# Patient Record
Sex: Male | Born: 1955 | Race: White | Hispanic: No | Marital: Single | Smoking: Never smoker
Health system: Southern US, Community
[De-identification: ages and names within clinical notes are randomized; demographics above are authoritative.]

## PROBLEM LIST (undated history)

## (undated) DIAGNOSIS — E669 Obesity, unspecified: Secondary | ICD-10-CM

## (undated) DIAGNOSIS — M1990 Unspecified osteoarthritis, unspecified site: Secondary | ICD-10-CM

## (undated) DIAGNOSIS — C61 Malignant neoplasm of prostate: Secondary | ICD-10-CM

## (undated) DIAGNOSIS — N529 Male erectile dysfunction, unspecified: Secondary | ICD-10-CM

## (undated) DIAGNOSIS — G4733 Obstructive sleep apnea (adult) (pediatric): Secondary | ICD-10-CM

## (undated) DIAGNOSIS — R1011 Right upper quadrant pain: Secondary | ICD-10-CM

## (undated) DIAGNOSIS — K219 Gastro-esophageal reflux disease without esophagitis: Secondary | ICD-10-CM

## (undated) DIAGNOSIS — I10 Essential (primary) hypertension: Secondary | ICD-10-CM

## (undated) DIAGNOSIS — K635 Polyp of colon: Secondary | ICD-10-CM

## (undated) DIAGNOSIS — F329 Major depressive disorder, single episode, unspecified: Secondary | ICD-10-CM

## (undated) DIAGNOSIS — E782 Mixed hyperlipidemia: Secondary | ICD-10-CM

## (undated) DIAGNOSIS — M109 Gout, unspecified: Secondary | ICD-10-CM

## (undated) DIAGNOSIS — T4145XA Adverse effect of unspecified anesthetic, initial encounter: Secondary | ICD-10-CM

## (undated) DIAGNOSIS — E7800 Pure hypercholesterolemia, unspecified: Secondary | ICD-10-CM

## (undated) DIAGNOSIS — E119 Type 2 diabetes mellitus without complications: Secondary | ICD-10-CM

## (undated) DIAGNOSIS — Z01818 Encounter for other preprocedural examination: Secondary | ICD-10-CM

## (undated) DIAGNOSIS — Z8739 Personal history of other diseases of the musculoskeletal system and connective tissue: Secondary | ICD-10-CM

## (undated) DIAGNOSIS — N200 Calculus of kidney: Secondary | ICD-10-CM

## (undated) DIAGNOSIS — F419 Anxiety disorder, unspecified: Secondary | ICD-10-CM

## (undated) DIAGNOSIS — R519 Headache, unspecified: Secondary | ICD-10-CM

## (undated) DIAGNOSIS — R350 Frequency of micturition: Secondary | ICD-10-CM

## (undated) DIAGNOSIS — J986 Disorders of diaphragm: Secondary | ICD-10-CM

## (undated) DIAGNOSIS — R0902 Hypoxemia: Secondary | ICD-10-CM

## (undated) DIAGNOSIS — G709 Myoneural disorder, unspecified: Secondary | ICD-10-CM

## (undated) DIAGNOSIS — R55 Syncope and collapse: Secondary | ICD-10-CM

## (undated) DIAGNOSIS — K429 Umbilical hernia without obstruction or gangrene: Secondary | ICD-10-CM

## (undated) DIAGNOSIS — Z87442 Personal history of urinary calculi: Secondary | ICD-10-CM

## (undated) DIAGNOSIS — IMO0001 Reserved for inherently not codable concepts without codable children: Secondary | ICD-10-CM

## (undated) DIAGNOSIS — R0602 Shortness of breath: Secondary | ICD-10-CM

## (undated) DIAGNOSIS — T8859XA Other complications of anesthesia, initial encounter: Secondary | ICD-10-CM

## (undated) DIAGNOSIS — R05 Cough: Secondary | ICD-10-CM

## (undated) HISTORY — PX: HERNIA REPAIR: SHX51

## (undated) HISTORY — DX: Encounter for other preprocedural examination: Z01.818

## (undated) HISTORY — PX: CATARACT EXTRACTION W/ INTRAOCULAR LENS  IMPLANT, BILATERAL: SHX1307

## (undated) HISTORY — DX: Major depressive disorder, single episode, unspecified: F32.9

## (undated) HISTORY — DX: Umbilical hernia without obstruction or gangrene: K42.9

## (undated) HISTORY — DX: Essential (primary) hypertension: I10

## (undated) HISTORY — DX: Obstructive sleep apnea (adult) (pediatric): G47.33

## (undated) HISTORY — PX: PROSTATE BIOPSY: SHX241

## (undated) HISTORY — DX: Hypoxemia: R09.02

## (undated) HISTORY — DX: Obesity, unspecified: E66.9

---
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
## 2004-06-02 ENCOUNTER — Ambulatory Visit: Payer: Self-pay | Admitting: Family Medicine

## 2004-06-02 ENCOUNTER — Ambulatory Visit: Payer: Self-pay | Admitting: Sports Medicine

## 2004-06-02 ENCOUNTER — Inpatient Hospital Stay (HOSPITAL_COMMUNITY): Admission: EM | Admit: 2004-06-02 | Discharge: 2004-06-04 | Payer: Self-pay | Admitting: Emergency Medicine

## 2004-06-02 ENCOUNTER — Encounter: Payer: Self-pay | Admitting: Cardiology

## 2004-06-02 ENCOUNTER — Ambulatory Visit: Payer: Self-pay | Admitting: Cardiology

## 2004-06-02 ENCOUNTER — Ambulatory Visit (HOSPITAL_COMMUNITY): Admission: RE | Admit: 2004-06-02 | Discharge: 2004-06-02 | Payer: Self-pay | Admitting: Family Medicine

## 2004-06-10 ENCOUNTER — Ambulatory Visit: Payer: Self-pay | Admitting: Family Medicine

## 2004-06-15 ENCOUNTER — Ambulatory Visit: Payer: Self-pay | Admitting: Family Medicine

## 2004-07-06 ENCOUNTER — Ambulatory Visit: Payer: Self-pay | Admitting: Family Medicine

## 2004-07-26 ENCOUNTER — Ambulatory Visit: Payer: Self-pay | Admitting: Sports Medicine

## 2004-08-24 ENCOUNTER — Ambulatory Visit: Payer: Self-pay | Admitting: Family Medicine

## 2004-09-21 ENCOUNTER — Ambulatory Visit (HOSPITAL_COMMUNITY): Admission: RE | Admit: 2004-09-21 | Discharge: 2004-09-21 | Payer: Self-pay | Admitting: Family Medicine

## 2004-09-21 ENCOUNTER — Ambulatory Visit: Payer: Self-pay | Admitting: Family Medicine

## 2004-11-23 ENCOUNTER — Ambulatory Visit: Payer: Self-pay | Admitting: Family Medicine

## 2005-03-15 ENCOUNTER — Ambulatory Visit: Payer: Self-pay | Admitting: Family Medicine

## 2005-04-21 ENCOUNTER — Ambulatory Visit: Payer: Self-pay | Admitting: Family Medicine

## 2005-06-23 ENCOUNTER — Ambulatory Visit: Payer: Self-pay | Admitting: Family Medicine

## 2005-07-06 ENCOUNTER — Ambulatory Visit: Payer: Self-pay | Admitting: Family Medicine

## 2006-01-03 ENCOUNTER — Ambulatory Visit: Payer: Self-pay | Admitting: Family Medicine

## 2006-01-16 ENCOUNTER — Ambulatory Visit: Payer: Self-pay | Admitting: Sports Medicine

## 2006-06-29 DIAGNOSIS — F339 Major depressive disorder, recurrent, unspecified: Secondary | ICD-10-CM

## 2006-06-29 DIAGNOSIS — I10 Essential (primary) hypertension: Secondary | ICD-10-CM | POA: Insufficient documentation

## 2006-06-29 DIAGNOSIS — M109 Gout, unspecified: Secondary | ICD-10-CM

## 2006-06-29 DIAGNOSIS — F411 Generalized anxiety disorder: Secondary | ICD-10-CM

## 2006-06-29 DIAGNOSIS — IMO0001 Reserved for inherently not codable concepts without codable children: Secondary | ICD-10-CM

## 2006-06-29 HISTORY — DX: Gout, unspecified: M10.9

## 2006-06-29 HISTORY — DX: Reserved for inherently not codable concepts without codable children: IMO0001

## 2006-11-15 ENCOUNTER — Ambulatory Visit: Payer: Self-pay | Admitting: Family Medicine

## 2006-11-15 ENCOUNTER — Encounter (INDEPENDENT_AMBULATORY_CARE_PROVIDER_SITE_OTHER): Payer: Self-pay | Admitting: *Deleted

## 2006-11-15 DIAGNOSIS — K429 Umbilical hernia without obstruction or gangrene: Secondary | ICD-10-CM | POA: Insufficient documentation

## 2006-11-16 LAB — CONVERTED CEMR LAB
CO2: 29 meq/L (ref 19–32)
Calcium: 9.1 mg/dL (ref 8.4–10.5)
Chloride: 99 meq/L (ref 96–112)
Creatinine, Ser: 1.02 mg/dL (ref 0.40–1.50)
Glucose, Bld: 158 mg/dL — ABNORMAL HIGH (ref 70–99)

## 2007-01-31 ENCOUNTER — Encounter (INDEPENDENT_AMBULATORY_CARE_PROVIDER_SITE_OTHER): Payer: Self-pay | Admitting: *Deleted

## 2007-01-31 ENCOUNTER — Ambulatory Visit: Payer: Self-pay | Admitting: Family Medicine

## 2007-02-01 LAB — CONVERTED CEMR LAB
BUN: 12 mg/dL (ref 6–23)
Calcium: 8.9 mg/dL (ref 8.4–10.5)
Cholesterol: 191 mg/dL (ref 0–200)
Glucose, Bld: 155 mg/dL — ABNORMAL HIGH (ref 70–99)
Hemoglobin: 14.8 g/dL (ref 13.0–17.0)
MCHC: 32 g/dL (ref 30.0–36.0)
MCV: 87 fL (ref 78.0–100.0)
Potassium: 4.6 meq/L (ref 3.5–5.3)
RBC: 5.31 M/uL (ref 4.22–5.81)
RDW: 14.4 % — ABNORMAL HIGH (ref 11.5–14.0)
Sodium: 139 meq/L (ref 135–145)
Triglycerides: 268 mg/dL — ABNORMAL HIGH (ref <150)
VLDL: 54 mg/dL — ABNORMAL HIGH (ref 0–40)

## 2007-02-07 ENCOUNTER — Ambulatory Visit (HOSPITAL_COMMUNITY): Admission: RE | Admit: 2007-02-07 | Discharge: 2007-02-07 | Payer: Self-pay | Admitting: *Deleted

## 2007-02-19 ENCOUNTER — Ambulatory Visit: Payer: Self-pay | Admitting: Sports Medicine

## 2007-02-19 DIAGNOSIS — E782 Mixed hyperlipidemia: Secondary | ICD-10-CM

## 2007-02-19 HISTORY — DX: Mixed hyperlipidemia: E78.2

## 2007-02-19 LAB — CONVERTED CEMR LAB
Blood Glucose, AC Bkfst: 169 mg/dL
Hgb A1c MFr Bld: 7.5 %

## 2007-03-05 ENCOUNTER — Encounter: Admission: RE | Admit: 2007-03-05 | Discharge: 2007-03-19 | Payer: Self-pay | Admitting: *Deleted

## 2007-03-07 ENCOUNTER — Encounter (INDEPENDENT_AMBULATORY_CARE_PROVIDER_SITE_OTHER): Payer: Self-pay | Admitting: *Deleted

## 2007-03-12 ENCOUNTER — Telehealth (INDEPENDENT_AMBULATORY_CARE_PROVIDER_SITE_OTHER): Payer: Self-pay | Admitting: Family Medicine

## 2007-04-03 ENCOUNTER — Ambulatory Visit: Payer: Self-pay | Admitting: Family Medicine

## 2007-04-04 ENCOUNTER — Telehealth (INDEPENDENT_AMBULATORY_CARE_PROVIDER_SITE_OTHER): Payer: Self-pay | Admitting: *Deleted

## 2007-04-16 ENCOUNTER — Encounter (INDEPENDENT_AMBULATORY_CARE_PROVIDER_SITE_OTHER): Payer: Self-pay | Admitting: *Deleted

## 2007-04-17 ENCOUNTER — Encounter (INDEPENDENT_AMBULATORY_CARE_PROVIDER_SITE_OTHER): Payer: Self-pay | Admitting: *Deleted

## 2007-05-04 ENCOUNTER — Encounter (INDEPENDENT_AMBULATORY_CARE_PROVIDER_SITE_OTHER): Payer: Self-pay | Admitting: *Deleted

## 2007-05-07 ENCOUNTER — Encounter (INDEPENDENT_AMBULATORY_CARE_PROVIDER_SITE_OTHER): Payer: Self-pay | Admitting: *Deleted

## 2007-07-20 ENCOUNTER — Ambulatory Visit: Payer: Self-pay | Admitting: Family Medicine

## 2007-07-20 ENCOUNTER — Encounter (INDEPENDENT_AMBULATORY_CARE_PROVIDER_SITE_OTHER): Payer: Self-pay | Admitting: *Deleted

## 2007-07-20 ENCOUNTER — Telehealth: Payer: Self-pay | Admitting: *Deleted

## 2007-07-20 LAB — CONVERTED CEMR LAB
ALT: 23 units/L (ref 0–53)
AST: 19 units/L (ref 0–37)
Albumin: 4.4 g/dL (ref 3.5–5.2)
Alkaline Phosphatase: 70 units/L (ref 39–117)
Calcium: 9 mg/dL (ref 8.4–10.5)
Chloride: 101 meq/L (ref 96–112)
Potassium: 4.2 meq/L (ref 3.5–5.3)
Sodium: 140 meq/L (ref 135–145)
Total Protein: 7.9 g/dL (ref 6.0–8.3)

## 2007-07-23 ENCOUNTER — Encounter (INDEPENDENT_AMBULATORY_CARE_PROVIDER_SITE_OTHER): Payer: Self-pay | Admitting: *Deleted

## 2007-08-21 ENCOUNTER — Ambulatory Visit: Payer: Self-pay | Admitting: Family Medicine

## 2007-08-27 ENCOUNTER — Encounter (INDEPENDENT_AMBULATORY_CARE_PROVIDER_SITE_OTHER): Payer: Self-pay | Admitting: *Deleted

## 2007-08-28 ENCOUNTER — Encounter (INDEPENDENT_AMBULATORY_CARE_PROVIDER_SITE_OTHER): Payer: Self-pay | Admitting: *Deleted

## 2007-10-03 ENCOUNTER — Encounter (INDEPENDENT_AMBULATORY_CARE_PROVIDER_SITE_OTHER): Payer: Self-pay | Admitting: *Deleted

## 2008-05-15 ENCOUNTER — Ambulatory Visit (HOSPITAL_COMMUNITY): Admission: RE | Admit: 2008-05-15 | Discharge: 2008-05-15 | Payer: Self-pay | Admitting: Family Medicine

## 2008-05-15 ENCOUNTER — Ambulatory Visit: Payer: Self-pay | Admitting: Family Medicine

## 2008-05-15 ENCOUNTER — Encounter: Payer: Self-pay | Admitting: Family Medicine

## 2008-05-16 ENCOUNTER — Telehealth (INDEPENDENT_AMBULATORY_CARE_PROVIDER_SITE_OTHER): Payer: Self-pay | Admitting: Family Medicine

## 2008-05-16 LAB — CONVERTED CEMR LAB
ALT: 18 units/L (ref 0–53)
AST: 21 units/L (ref 0–37)
Albumin: 4 g/dL (ref 3.5–5.2)
Alkaline Phosphatase: 89 units/L (ref 39–117)
BUN: 10 mg/dL (ref 6–23)
CO2: 27 meq/L (ref 19–32)
Calcium: 8.7 mg/dL (ref 8.4–10.5)
Chloride: 101 meq/L (ref 96–112)
Creatinine, Ser: 1.03 mg/dL (ref 0.40–1.50)
Direct LDL: 87 mg/dL
Glucose, Bld: 214 mg/dL — ABNORMAL HIGH (ref 70–99)
Potassium: 4.3 meq/L (ref 3.5–5.3)
Sodium: 142 meq/L (ref 135–145)
TSH: 3.7 microintl units/mL (ref 0.350–4.50)
Total Bilirubin: 0.5 mg/dL (ref 0.3–1.2)
Total Protein: 7.5 g/dL (ref 6.0–8.3)

## 2008-05-21 ENCOUNTER — Encounter: Payer: Self-pay | Admitting: Family Medicine

## 2008-06-02 ENCOUNTER — Ambulatory Visit: Payer: Self-pay | Admitting: Cardiovascular Disease

## 2008-10-03 ENCOUNTER — Encounter: Payer: Self-pay | Admitting: Family Medicine

## 2008-10-03 ENCOUNTER — Ambulatory Visit: Payer: Self-pay | Admitting: Family Medicine

## 2008-10-03 DIAGNOSIS — E1149 Type 2 diabetes mellitus with other diabetic neurological complication: Secondary | ICD-10-CM

## 2008-10-03 DIAGNOSIS — E1165 Type 2 diabetes mellitus with hyperglycemia: Secondary | ICD-10-CM

## 2008-10-03 LAB — CONVERTED CEMR LAB
ALT: 18 units/L (ref 0–53)
AST: 16 units/L (ref 0–37)
Albumin: 4.1 g/dL (ref 3.5–5.2)
Alkaline Phosphatase: 84 units/L (ref 39–117)
BUN: 11 mg/dL (ref 6–23)
Hgb A1c MFr Bld: 8.9 %
Potassium: 4.5 meq/L (ref 3.5–5.3)
Sodium: 142 meq/L (ref 135–145)

## 2008-10-07 ENCOUNTER — Encounter: Payer: Self-pay | Admitting: Family Medicine

## 2008-11-28 ENCOUNTER — Ambulatory Visit: Payer: Self-pay | Admitting: Family Medicine

## 2008-11-28 DIAGNOSIS — L909 Atrophic disorder of skin, unspecified: Secondary | ICD-10-CM | POA: Insufficient documentation

## 2008-11-28 DIAGNOSIS — L919 Hypertrophic disorder of the skin, unspecified: Secondary | ICD-10-CM

## 2009-06-09 ENCOUNTER — Ambulatory Visit: Payer: Self-pay | Admitting: Family Medicine

## 2009-10-12 ENCOUNTER — Encounter: Payer: Self-pay | Admitting: Family Medicine

## 2009-10-28 ENCOUNTER — Telehealth: Payer: Self-pay | Admitting: *Deleted

## 2009-12-04 ENCOUNTER — Ambulatory Visit: Payer: Self-pay | Admitting: Family Medicine

## 2009-12-04 LAB — CONVERTED CEMR LAB
Glucose, Urine, Semiquant: 500
Protein, U semiquant: 30

## 2009-12-14 ENCOUNTER — Telehealth: Payer: Self-pay | Admitting: Family Medicine

## 2009-12-21 ENCOUNTER — Emergency Department (HOSPITAL_COMMUNITY): Admission: EM | Admit: 2009-12-21 | Discharge: 2009-12-21 | Payer: Self-pay | Admitting: Emergency Medicine

## 2009-12-28 ENCOUNTER — Encounter: Payer: Self-pay | Admitting: Family Medicine

## 2010-02-10 ENCOUNTER — Ambulatory Visit: Payer: Self-pay | Admitting: Family Medicine

## 2010-02-10 ENCOUNTER — Encounter: Payer: Self-pay | Admitting: *Deleted

## 2010-02-11 ENCOUNTER — Encounter: Payer: Self-pay | Admitting: Family Medicine

## 2010-02-11 LAB — CONVERTED CEMR LAB
ALT: 23 units/L (ref 0–53)
AST: 27 units/L (ref 0–37)
Albumin: 3.8 g/dL (ref 3.5–5.2)
BUN: 9 mg/dL (ref 6–23)
CO2: 31 meq/L (ref 19–32)
Calcium: 8.7 mg/dL (ref 8.4–10.5)
Chloride: 97 meq/L (ref 96–112)
Potassium: 4.4 meq/L (ref 3.5–5.3)

## 2010-03-05 ENCOUNTER — Ambulatory Visit: Payer: Self-pay | Admitting: Family Medicine

## 2010-03-05 LAB — CONVERTED CEMR LAB: Hgb A1c MFr Bld: 10.7 %

## 2010-03-06 ENCOUNTER — Encounter: Payer: Self-pay | Admitting: Family Medicine

## 2010-05-13 ENCOUNTER — Ambulatory Visit: Admission: RE | Admit: 2010-05-13 | Discharge: 2010-05-13 | Payer: Self-pay | Source: Home / Self Care

## 2010-05-13 DIAGNOSIS — L03818 Cellulitis of other sites: Secondary | ICD-10-CM

## 2010-05-13 DIAGNOSIS — L02818 Cutaneous abscess of other sites: Secondary | ICD-10-CM | POA: Insufficient documentation

## 2010-06-01 NOTE — Assessment & Plan Note (Signed)
Summary: Skin tag removal, DM   Vital Signs:  Patient profile:   56 year old male Weight:      286 pounds Temp:     98.7 degrees F oral Pulse rate:   100 / minute Pulse rhythm:   regular BP sitting:   148 / 90  (left arm) Cuff size:   large  Vitals Entered By: Loralee Pacas CMA (March 05, 2010 1:39 PM) CC: ski tags Is Patient Diabetic? Yes Did you bring your meter with you today? No   Primary Provider:  Myrtie Soman  MD  CC:  ski tags.  History of Present Illness: 1. Skin tag Had skin tags removed about a year ago with cryotherapy. Now they have returned. Under right arm-pit. Irritating when putting on deodorant.   Habits & Providers  Alcohol-Tobacco-Diet     Tobacco Status: never  Exercise-Depression-Behavior     Have you felt down or hopeless? yes     Have you felt little pleasure in things? no     Depression Counseling: further diagnostic testing and/or other treatment is indicated     Seat Belt Use: always  Allergies: No Known Drug Allergies  Social History: Risk analyst Use:  always  Physical Exam  Skin:  20+ skin tags under right arm-pit. Bases range from 2-51mm.   Hair is shaved.  No erythema/swelling.    Impression & Recommendations:  Problem # 1:  SKIN TAG (ICD-701.9)  Removed 5 of the larger skin tags under right arm. Clamped with hemostat and then sniped. After removing 5, patient said he did not want any more removed at this time due to the pain. Will consider cryotherapy for future skin tags. This had worked in the past but today's technique was tried due to recurrence of skin tags. Told patient to keep underarm clean, apply antibiotic ointment, and keep area covered to prevent infection.   Orders: [facility]- Est Level  3 (99213) Skin Tags (up to 15) - [facility] (11200)  Problem # 2:  DIABETES MELLITUS, TYPE II, UNCONTROLLED (ICD-250.02) A1c is higher than previous. Today it is 10.7 (was 10.3 previously). Did not have time to discuss this issue in  depth. He probably needs insulin but think he will resist doing this. May consider increasing his glipizide to maximum if he tolerates. Will ask him to make appointment. But he says paying for gas has been difficult.  His updated medication list for this problem includes:    Aspirin Childrens 81 Mg Chew (Aspirin) .Marland Kitchen... Take 1 tablet by mouth once a day    Accupril 40 Mg Tabs (Quinapril hcl) .Marland Kitchen... 1 by mouth two times a day.    Metformin Hcl 1000 Mg Tabs (Metformin hcl) .Marland Kitchen... Take 1 tablet by mouth two times a day    Glipizide 5 Mg Tb24 (Glipizide) .Marland Kitchen... Take 1 tab by mouth daily    Glucotrol 10 Mg Tabs (Glipizide) .Marland Kitchen... Take one tablet twice a day.  Complete Medication List: 1)  Aspirin Childrens 81 Mg Chew (Aspirin) .... Take 1 tablet by mouth once a day 2)  Accupril 40 Mg Tabs (Quinapril hcl) .Marland Kitchen.. 1 by mouth two times a day. 3)  Toprol Xl 100 Mg Tb24 (Metoprolol succinate) .... 1.5 by mouth qday 4)  Norvasc 10 Mg Tabs (Amlodipine besylate) .Marland Kitchen.. 1 by mouth once daily 5)  Protonix 40 Mg Tbec (Pantoprazole sodium) .... Once daily 6)  Clarinex 5 Mg Tabs (Desloratadine) .Marland Kitchen.. 1 by mouth qday 7)  Albuterol 90 Mcg/act Aers (Albuterol) .Marland KitchenMarland KitchenMarland Kitchen  1-2 puffs inhaled q 4-6 hr prn 8)  Allopurinol 300 Mg Tabs (Allopurinol) .Marland Kitchen.. 1 by mouth qday 9)  Colchicine 0.6 Mg Tabs (Colchicine) .Marland Kitchen.. 1 by mouth every 1-2 hrs as needed gout 10)  Metformin Hcl 1000 Mg Tabs (Metformin hcl) .... Take 1 tablet by mouth two times a day 11)  Ibuprofen 800 Mg Tabs (Ibuprofen) .Marland Kitchen.. 1 by mouth 3x daily 12)  Flonase 50 Mcg/act Susp (Fluticasone propionate) .... 2 spr once daily  dispense 2 month supply 13)  Glipizide 5 Mg Tb24 (Glipizide) .... Take 1 tab by mouth daily 14)  Lipitor 20 Mg Tabs (Atorvastatin calcium) .... Take 1 tab by mouth qhs 15)  Accu-chek Aviva Strp (Glucose blood) .... For checking sugars two times a day; dispense 2 month's supply 16)  Ibuprofen 800 Mg Tabs (Ibuprofen) .... Take 1 tab by mouth every 6 hours as  needed for pain 17)  Glucotrol 10 Mg Tabs (Glipizide) .... Take one tablet twice a day.  Other Orders: A1C-[facility] (47829)   Orders Added: 1)  A1C-[facility] [83036] 2)  [facility]- Est Level  3 [56213] 3)  Skin Tags (up to 15) - [facility] [11200]    Laboratory Results   Blood Tests   Date/Time Received: March 05, 2010 1:49 PM  Date/Time Reported: March 05, 2010 2:09 PM   HGBA1C: 10.7%   (Normal Range: Non-Diabetic - 3-6%   Control Diabetic - 6-8%)  Comments: ...........test performed by...........Marland KitchenTerese Door, CMA

## 2010-06-01 NOTE — Miscellaneous (Signed)
Summary: chart update  Pt doesn't follow-up regularly. Has many medical problems but is not very motivated to address them adequately. Poor resources. 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   Clinical Lists Changes  Problems: Removed problem of SKIN TAG (ICD-701.9) Removed problem of NEVUS, BENIGN (ICD-216.9) Removed problem of SYMPTOM, COUGH (ICD-786.2) Observations: Added new observation of PRIMARY MD: Myrtie Soman  MD (10/12/2009 11:59)

## 2010-06-01 NOTE — Letter (Signed)
Summary: Generic Letter  [facility]  [address]   [place], [place] [address]   Phone: [phone]  Fax: [phone]    03/06/2010  ARTICE BERGERSON [address] [place], [place]  [address]  Dear Mr. HOVIS,  Your Hgb A1c (an average of your blood sugars over a 3 month period) was very elevated at 10.7. This means that your sugars values have consistently very high for the past few months. For patients with diabetes, the goal is to get this number below 7. Would you please make an appointment to come and see me as soon as you conveniently can so we could come up with a good plan for your diabetes? I am concerned because sugars this high may cause many health problems in the future, and I would like to prevent this as much as possible.     Sincerely,   Priscella Mann MD

## 2010-06-01 NOTE — Assessment & Plan Note (Signed)
Summary: pt has broke tooth and needs dental referral,tcb   Vital Signs:  Patient profile:   55 year old male Height:      67.5 inches Weight:      286.4 pounds BMI:     44.35 Temp:     98.3 degrees F oral Pulse rate:   98 / minute BP sitting:   157 / 99  (right arm) Cuff size:   large  Vitals Entered By: Garen Grams LPN (June 09, 2009 10:06 AM) CC: teeth problems Is Patient Diabetic? Yes Did you bring your meter with you today? No   Primary Care Provider:  Myrtie Soman  MD  CC:  teeth problems.  History of Present Illness: 1. Tooth problems:  Pt has been having some teeth problems for the past month.  He has a broken molar on the bottom, left; a hole in another molar on the right; multiple areas of teeth erosion; swelling gum and pain on the bottom right.  It has been getting worse.  The pain is now a 6/10.  He thinks that he might have had a fever but did not check his temp.  He does think that there is a foul smell occassionally.  Decreased  appetite.  Habits & Providers  Alcohol-Tobacco-Diet     Tobacco Status: never  Current Problems (verified): 1)  Abscess, Tooth  (ICD-522.5) 2)  Broken Tooth  (ICD-873.63) 3)  Skin Tag  (ICD-701.9) 4)  Wart, Viral  (ICD-078.10) 5)  Diabetes Mellitus, Type II, Uncontrolled  (ICD-250.02) 6)  Chest Discomfort  (ICD-786.59) 7)  Diabetes Mellitus  (ICD-250.0) 8)  Nevus, Benign  (ICD-216.9) 9)  Hyperlipidemia  (ICD-272.2) 10)  Diabetes-type 2  (ICD-250.00) 11)  Screening For Malignant Neoplasm, Prostate  (ICD-V76.44) 12)  Symptom, Cough  (ICD-786.2) 13)  Hemoccult Positive Stool  (ICD-578.1) 14)  Hernia, Umbilical  (ICD-553.1) 15)  Seborrheic Dermatitis, Nos  (ICD-690.10) 16)  Rhinitis, Allergic  (ICD-477.9) 17)  Obesity, Nos  (ICD-278.00) 18)  Hypertension, Benign Systemic  (ICD-401.1) 19)  Gout, Unspecified  (ICD-274.9) 20)  Depression, Major, Recurrent  (ICD-296.30) 21)  Anxiety  (ICD-300.00)  Current Medications  (verified): 1)  Aspirin Childrens 81 Mg Chew (Aspirin) .... Take 1 Tablet By Mouth Once A Day 2)  Accupril 40 Mg  Tabs (Quinapril Hcl) .Marland Kitchen.. 1 By Mouth Once Daily 3)  Toprol Xl 100 Mg  Tb24 (Metoprolol Succinate) .... 1.5 By Mouth Qday 4)  Norvasc 10 Mg Tabs (Amlodipine Besylate) .Marland Kitchen.. 1 By Mouth Once Daily 5)  Protonix 40 Mg  Tbec (Pantoprazole Sodium) .... Once Daily 6)  Clarinex 5 Mg  Tabs (Desloratadine) .Marland Kitchen.. 1 By Mouth Qday 7)  Albuterol 90 Mcg/act Aers (Albuterol) .Marland Kitchen.. 1-2 Puffs Inhaled Q 4-6 Hr Prn 8)  Allopurinol 300 Mg Tabs (Allopurinol) .Marland Kitchen.. 1 By Mouth Qday 9)  Colchicine 0.6 Mg Tabs (Colchicine) .Marland Kitchen.. 1 By Mouth Every 1-2 Hrs As Needed Gout 10)  Metformin Hcl 1000 Mg Tabs (Metformin Hcl) .... Take 1 Tablet By Mouth Two Times A Day 11)  Ibuprofen 800 Mg Tabs (Ibuprofen) .Marland Kitchen.. 1 By Mouth 3x Daily 12)  Flonase 50 Mcg/act Susp (Fluticasone Propionate) .... 2 Spr Once Daily  Dispense 2 Month Supply 13)  Glipizide 5 Mg  Tb24 (Glipizide) .... Take 1 Tab By Mouth Daily 14)  Lipitor 20 Mg Tabs (Atorvastatin Calcium) .... Take 1 Tab By Mouth Qhs 15)  Hydrochlorothiazide 12.5 Mg Tabs (Hydrochlorothiazide) .... One By Mouth Daily 16)  Accu-Chek Aviva  Strp (Glucose Blood) .Marland KitchenMarland KitchenMarland Kitchen  For Checking Sugars Two Times A Day; Dispense 2 Month's Supply 17)  Lotrimin Af 1 % Crea (Clotrimazole) .... Apply To Rash Two Times A Day X 14 Days; Disp 30 Gm Tube 18)  Keflex 500 Mg Caps (Cephalexin) .... Take 1 Tab By Mouth Twice A Day For 7 Days 19)  Ibuprofen 800 Mg Tabs (Ibuprofen) .... Take 1 Tab By Mouth Every 6 Hours As Needed For Pain  Allergies: No Known Drug Allergies  Past History:  Risk Factors: Smoking Status: never (06/09/2009)  Social History: Reviewed history from 11/28/2008 and no changes required. Lives with father and step-mother.  He does not work due to depression - out of work since 1992.  He does not drink or smoke.  Worked previously as a Production designer, theatre/television/film man at [employer] - no work in 14 years  secondary to mental health disability.  2 sisters and 1/2 brother here in [place]. Has MAP and Rudell Cobb to help with medical expenses. Transportation is difficult.  Has trouble making office visits. Not sure when he'll be able to come back to the office (due to father not giving him gas money)  Physical Exam  General:  Vital signs reviewed -- hypertensive, obese    Alert, appropriate; well-dressed and well-nourished  Mouth:  Fragment molar on the bottom left, hole at the base of another molar on the right.  Multiple areas of erosion.  Gums on the right bottom are swollen, red, and tender.   Impression & Recommendations:  Problem # 1:  ABSCESS, TOOTH (ICD-522.5) Treat with Keflex and Dentist Referral Orders: Dental Referral (Dentist) [facility]- Est Level  3 (16109)  Problem # 2:  BROKEN TOOTH (UEA-540.98) Treat with Ibuprofen and Dental Referral Orders: Dental Referral (Dentist) [facility]- Est Level  3 (11914)  Complete Medication List: 1)  Aspirin Childrens 81 Mg Chew (Aspirin) .... Take 1 tablet by mouth once a day 2)  Accupril 40 Mg Tabs (Quinapril hcl) .Marland Kitchen.. 1 by mouth once daily 3)  Toprol Xl 100 Mg Tb24 (Metoprolol succinate) .... 1.5 by mouth qday 4)  Norvasc 10 Mg Tabs (Amlodipine besylate) .Marland Kitchen.. 1 by mouth once daily 5)  Protonix 40 Mg Tbec (Pantoprazole sodium) .... Once daily 6)  Clarinex 5 Mg Tabs (Desloratadine) .Marland Kitchen.. 1 by mouth qday 7)  Albuterol 90 Mcg/act Aers (Albuterol) .Marland Kitchen.. 1-2 puffs inhaled q 4-6 hr prn 8)  Allopurinol 300 Mg Tabs (Allopurinol) .Marland Kitchen.. 1 by mouth qday 9)  Colchicine 0.6 Mg Tabs (Colchicine) .Marland Kitchen.. 1 by mouth every 1-2 hrs as needed gout 10)  Metformin Hcl 1000 Mg Tabs (Metformin hcl) .... Take 1 tablet by mouth two times a day 11)  Ibuprofen 800 Mg Tabs (Ibuprofen) .Marland Kitchen.. 1 by mouth 3x daily 12)  Flonase 50 Mcg/act Susp (Fluticasone propionate) .... 2 spr once daily  dispense 2 month supply 13)  Glipizide 5 Mg Tb24 (Glipizide) .... Take 1 tab by mouth daily 14)   Lipitor 20 Mg Tabs (Atorvastatin calcium) .... Take 1 tab by mouth qhs 15)  Hydrochlorothiazide 12.5 Mg Tabs (Hydrochlorothiazide) .... One by mouth daily 16)  Accu-chek Aviva Strp (Glucose blood) .... For checking sugars two times a day; dispense 2 month's supply 17)  Lotrimin Af 1 % Crea (Clotrimazole) .... Apply to rash two times a day x 14 days; disp 30 gm tube 18)  Keflex 500 Mg Caps (Cephalexin) .... Take 1 tab by mouth twice a day for 7 days 19)  Ibuprofen 800 Mg Tabs (Ibuprofen) .... Take 1 tab by mouth  every 6 hours as needed for pain  Patient Instructions: 1)  We will get you for an urgent referral to a dentist 2)  You have an infection of your gums, you will need to take Keflex 3)  I have also prescribed an anti-inflammatory medicine for your pain 4)  Please follow up with Dr. Rexene Alberts after you appt with the dentist Prescriptions: IBUPROFEN 800 MG TABS (IBUPROFEN) Take 1 tab by mouth every 6 hours as needed for pain  #30 x 0   Entered and Authorized by:   Angelena Sole MD   Signed by:   Angelena Sole MD on 06/09/2009   Method used:   Print then Give to Patient   RxID:   [id number] KEFLEX 500 MG CAPS (CEPHALEXIN) Take 1 tab by mouth twice a day for 7 days  #14 x 0   Entered and Authorized by:   Angelena Sole MD   Signed by:   Angelena Sole MD on 06/09/2009   Method used:   Print then Give to Patient   RxID:   [id number]

## 2010-06-01 NOTE — Progress Notes (Signed)
Summary: phnm sg   Phone Note Call from Patient Call back at [phone]   Caller: Patient Summary of Call: wants to know if he can go to someone different for his referral Initial call taken by: De Nurse,  December 14, 2009 4:36 PM  Follow-up for Phone Call        message left for patient to call back.  will explain when he calls that because he has the [employer] orange card this referral was made thru [facility] . they schedule appointments with doctors  that volunteer with this program. will not be able to schedule with another ophthalmologist  unless he wants to pay out of pocket. Follow-up by: Theresia Lo RN,  December 15, 2009 8:58 AM  Additional Follow-up for Phone Call Additional follow up Details #1::        pt  returned call. Additional Follow-up by: Clydell Hakim,  December 15, 2009 2:07 PM    Additional Follow-up for Phone Call Additional follow up Details #2::    spoke with patient and  he is referring to the surgical referral. states he saw Dr. Freida Busman 2 years ago and she told him he was not a surgical canditate. advised patient to keep appointment and if not satisfied with visit to call us back . Follow-up by: Theresia Lo RN,  December 15, 2009 3:42 PM

## 2010-06-01 NOTE — Progress Notes (Signed)
Summary: Rx Req   Phone Note Refill Request Call back at Home Phone [phone] Message from:  Patient  Refills Requested: Medication #1:  METFORMIN HCL 1000 MG TABS Take 1 tablet by mouth two times a day  Medication #2:  COLCHICINE 0.6 MG TABS 1 by mouth every 1-2 hrs as needed gout [facility].  Initial call taken by: Clydell Hakim,  October 28, 2009 4:29 PM  Follow-up for Phone Call       Follow-up by: Golden Circle RN,  October 28, 2009 4:38 PM    Prescriptions: METFORMIN HCL 1000 MG TABS (METFORMIN HCL) Take 1 tablet by mouth two times a day  #62 x 3   Entered by:   Golden Circle RN   Authorized by:   Priscella Mann MD   Signed by:   Golden Circle RN on 10/28/2009   Method used:   Printed then faxed to ...       [facility] (retail)       [address] [place], [place]  [address]       Ph: [phone]       Fax: [phone]   RxID:   [id number] COLCHICINE 0.6 MG TABS (COLCHICINE) 1 by mouth every 1-2 hrs as needed gout  #100 x 0   Entered by:   Golden Circle RN   Authorized by:   Priscella Mann MD   Signed by:   Golden Circle RN on 10/28/2009   Method used:   Printed then faxed to ...       [facility] Department (retail)       [address] [place], [place]  [address]       Ph: [phone]       Fax: [phone]   RxID:   [id number]  faxed with message that pt needs an appt before more refills.Golden Circle RN  October 28, 2009 4:40 PM

## 2010-06-01 NOTE — Consult Note (Signed)
Summary: [facility]   Imported By: Clydell Hakim 01/21/2010 14:58:50  _____________________________________________________________________  External Attachment:    Type:   Image     Comment:   External Document

## 2010-06-01 NOTE — Assessment & Plan Note (Signed)
Summary: f/u,df(resc'd from 8/2)[facility]     Vital Signs:  Patient profile:   55 year old male Height:      67.5 inches Weight:      282.6 pounds BMI:     43.77 Temp:     98.8 degrees F oral Pulse rate:   93 / minute BP sitting:   152 / 96  (left arm) Cuff size:   large  Vitals Entered By: Garen Grams LPN (December 04, 2009 3:32 PM)  Primary Provider:  Myrtie Soman  MD   History of Present Illness: 1. R eye blurriness Has been happening 2-3 x past 6 mo.  Symptoms resolve in a few days and have not been getting worse. Does not wear glasses.  Denies dizziness or headache.  2. Umbilical herniation Pt says getting bigger. Was 5 cm last year, now 6 cm.  Saw surgeon in past to have repaired, but surgeon said he wasn't good surgical candidate. Pt denies any abdominal pain or discomfort, blood in stool, nausea, dysphagia.  3. Diabetes A1c in clinic today is 10. Up from 7.8 last year. C/o urinary frequency (6-7 x a day), occasional tingling in toes (no numbness).  4. HTN Pt says he has been taking all medications except HCTZ, which he cannot afford. [name] on MAP program and does not work due to mental illness (traumatic childhood). He does not like/want to be around people.  Current Medications (verified): 1)  Aspirin Childrens 81 Mg Chew (Aspirin) .... Take 1 Tablet By Mouth Once A Day 2)  Accupril 40 Mg  Tabs (Quinapril Hcl) .Marland Kitchen.. 1 By Mouth Once Daily 3)  Toprol Xl 100 Mg  Tb24 (Metoprolol Succinate) .... 1.5 By Mouth Qday 4)  Norvasc 10 Mg Tabs (Amlodipine Besylate) .Marland Kitchen.. 1 By Mouth Once Daily 5)  Protonix 40 Mg  Tbec (Pantoprazole Sodium) .... Once Daily 6)  Clarinex 5 Mg  Tabs (Desloratadine) .Marland Kitchen.. 1 By Mouth Qday 7)  Albuterol 90 Mcg/act Aers (Albuterol) .Marland Kitchen.. 1-2 Puffs Inhaled Q 4-6 Hr Prn 8)  Allopurinol 300 Mg Tabs (Allopurinol) .Marland Kitchen.. 1 By Mouth Qday 9)  Colchicine 0.6 Mg Tabs (Colchicine) .Marland Kitchen.. 1 By Mouth Every 1-2 Hrs As Needed Gout 10)  Metformin Hcl 1000 Mg Tabs (Metformin  Hcl) .... Take 1 Tablet By Mouth Two Times A Day 11)  Ibuprofen 800 Mg Tabs (Ibuprofen) .Marland Kitchen.. 1 By Mouth 3x Daily 12)  Flonase 50 Mcg/act Susp (Fluticasone Propionate) .... 2 Spr Once Daily  Dispense 2 Month Supply 13)  Glipizide 5 Mg  Tb24 (Glipizide) .... Take 1 Tab By Mouth Daily 14)  Lipitor 20 Mg Tabs (Atorvastatin Calcium) .... Take 1 Tab By Mouth Qhs 15)  Hydrochlorothiazide 12.5 Mg Tabs (Hydrochlorothiazide) .... One By Mouth Daily 16)  Accu-Chek Aviva  Strp (Glucose Blood) .... For Checking Sugars Two Times A Day; Dispense 2 Month's Supply 17)  Lotrimin Af 1 % Crea (Clotrimazole) .... Apply To Rash Two Times A Day X 14 Days; Disp 30 Gm Tube 18)  Keflex 500 Mg Caps (Cephalexin) .... Take 1 Tab By Mouth Twice A Day For 7 Days 19)  Ibuprofen 800 Mg Tabs (Ibuprofen) .... 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   He does not work due to depression - out of work since 1992.   He does not drink or smoke.   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   2 sisters and 1/2 brother here in [place]. Has MAP and Rudell Cobb to help with medical expenses; on MAP program for medications.  Transportation is difficult.  Has trouble making office visits. Not sure when he'll be able to come back to the office (due to father not giving him gas money)  Physical Exam  General:  Well-groomed. Comfortable. Tearful when talking about past. Eyes:  Tried but unable to visualize retina.  Ears:  TM pearly gray BL Nose:  No rhinorrhea Mouth:  No erythema, sores Fair dentition with crowns.  Neck:  Supple, no thyromegaly Lungs:  CTAB Heart:  RRR,  no m/r/g Abdomen:  NABS, soft, non-tender throughout Distended with 6 cm hernia, non-reducible Extremities:  No pedal edema bilaterally Intact but mildly decrased pedal sensation  Psych:  Tearful Appropriate to questions   Impression & Recommendations:  Problem # 1:  DIABETES MELLITUS, TYPE II, UNCONTROLLED (ICD-250.02) Pt has not been following up as regularly as Dr. Rexene Alberts advised him to because he has difficulty getting to the clinic (having his father drive him and paying for gas). Diabetes poorly controlled. High A1c, elevated urine Glc & protein.  Pt not motivated/unwilling to make any lifestyle changes. Spoke with him extensively about making small changes, and he finally agreed to cut his carb intake by half and eat more beans. He will also reduce his 4 sodas a day to 2 and will drink more water or 0 calorie drinks. Increased his glipizide to 10 two times a day (from 5 once daily). Filled Rx for metformin. WIll consider starting insulin once he gets a glucometer and shows that he is able to regularly check his sugars. WIll refer him to ophthalmologist for retinal exam due to diabetes and blurry vision. But this may take time since he gets help through [facility] and must see specialists through [facility].  He will follow-up with me in about 2 wks. He will get labs first (FLP, CMET), and then make appt to see me a few days after. I want to evaluate his liver and especially his kidney function considering the protein in his urine.   His updated medication list for this problem includes:    Aspirin Childrens 81 Mg Chew (Aspirin) .Marland Kitchen... Take 1 tablet by mouth once a day    Accupril 40 Mg Tabs (Quinapril hcl) .Marland Kitchen... 1 by mouth two times a day.    Metformin Hcl 1000 Mg Tabs (Metformin hcl) .Marland Kitchen... Take 1 tablet by mouth two times a day    Glipizide 5 Mg Tb24 (Glipizide) .Marland Kitchen... Take 1 tab by mouth daily    Glucotrol 10 Mg Tabs (Glipizide) .Marland Kitchen... Take one tablet twice a  day.  Orders: A1C-[facility] (84696) UA Glucose/Protein-[facility] (81002) [facility]- Est Level  3 (29528) Ophthalmology Referral (Ophthalmology)Future Orders: Lipid-[facility] ([id number]) ... 12/31/2010 Comp Met-[facility] ([id number]) ... 12/31/2010  Problem # 2:  HERNIA, UMBILICAL (ICD-553.1) Not incarcerated but growing slowly in size and cosmetically distressing. Referred him to surgery but this may take time since he must get it through [facility].  Orders: [facility]- Est Level  3 (47425) Surgical Referral (Surgery)  Problem # 3:  ANXIETY (ICD-300.00) Pt has very low opinion of himself and abilities. Told him I thought  he was capable of more than he gave himself credit for. Told him to keep his mind and options open. He agreed and said he would try.  He has not been going to [facility] mental health clinic.   Complete Medication List: 1)  Aspirin Childrens 81 Mg Chew (Aspirin) .... Take 1 tablet by mouth once a day 2)  Accupril 40 Mg Tabs (Quinapril hcl) .Marland Kitchen.. 1 by mouth two times a day. 3)  Toprol Xl 100 Mg Tb24 (Metoprolol succinate) .... 1.5 by mouth qday 4)  Norvasc 10 Mg Tabs (Amlodipine besylate) .Marland Kitchen.. 1 by mouth once daily 5)  Protonix 40 Mg Tbec (Pantoprazole sodium) .... Once daily 6)  Clarinex 5 Mg Tabs (Desloratadine) .Marland Kitchen.. 1 by mouth qday 7)  Albuterol 90 Mcg/act Aers (Albuterol) .Marland Kitchen.. 1-2 puffs inhaled q 4-6 hr prn 8)  Allopurinol 300 Mg Tabs (Allopurinol) .Marland Kitchen.. 1 by mouth qday 9)  Colchicine 0.6 Mg Tabs (Colchicine) .Marland Kitchen.. 1 by mouth every 1-2 hrs as needed gout 10)  Metformin Hcl 1000 Mg Tabs (Metformin hcl) .... Take 1 tablet by mouth two times a day 11)  Ibuprofen 800 Mg Tabs (Ibuprofen) .Marland Kitchen.. 1 by mouth 3x daily 12)  Flonase 50 Mcg/act Susp (Fluticasone propionate) .... 2 spr once daily  dispense 2 month supply 13)  Glipizide 5 Mg Tb24 (Glipizide) .... Take 1 tab by mouth daily 14)  Lipitor 20 Mg Tabs (Atorvastatin calcium) .... Take 1 tab by mouth qhs 15)  Accu-chek Aviva Strp (Glucose  blood) .... For checking sugars two times a day; dispense 2 month's supply 16)  Ibuprofen 800 Mg Tabs (Ibuprofen) .... Take 1 tab by mouth every 6 hours as needed for pain 17)  Glucotrol 10 Mg Tabs (Glipizide) .... Take one tablet twice a day.     Patient Instructions: 1)  Please come see me in 2 wks. Please try to get labwork down before then. If you can't, then just come in to do labwork when you are able, and I will see you when those results come in.  2)  I increased your dose of Glipizide. Please take that increased dose now two times a day. I also am giving you a new prescription for metformin.  3)  Please also take one of your blood pressure medications Acupril now twice a day instead of once. 4)  Please try to make those healthy changes in your diet that we talked about. And try to exercise more, biking or swimming. 5)  And I feel like you are so much more capable than you give yourself credit for. Don't let your past hold you back more than it already has.  Prescriptions: METFORMIN HCL 1000 MG TABS (METFORMIN HCL) Take 1 tablet by mouth two times a day  #62 x 3   Entered and Authorized by:   Priscella Mann MD   Signed by:   [name] MD on 12/04/2009   Method used:   Print then Give to Patient   RxID:   [id number] GLUCOTROL 10 MG TABS (GLIPIZIDE) Take one tablet twice a day.  #60 x 3   Entered and Authorized by:   Priscella Mann MD   Signed by:   [name] MD on 12/04/2009   Method used:   Print then Give to Patient   RxID:   [id number]   Laboratory Results   Urine Tests  Date/Time Received: December 04, 2009 4:38 PM  Date/Time Reported: December 04, 2009 4:39 PM  Routine Urinalysis   Glucose: 500   (Normal Range: Negative) Protein: 30   (Normal Range: Negative)    Comments: ...........test performed by...........Marland KitchenTerese Door, CMA   Blood Tests   Date/Time Received: December 04, 2009 3:36 PM  Date/Time Reported: December 04, 2009 3:46  PM   HGBA1C: 10.3%   (Normal Range: Non-Diabetic - 3-6%   Control Diabetic - 6-8%)  Comments: .......test performed by........Marland Kitchen Garen Grams, LPN entered by Terese Door, CMA

## 2010-06-01 NOTE — Assessment & Plan Note (Signed)
Summary: Flu vaccine given   Nurse Visit   Vital Signs:  Patient profile:   55 year old male Temp:     98.3 degrees F  Vitals Entered By: Theresia Lo RN (February 10, 2010 2:16 PM)  Allergies: No Known Drug Allergies  Immunizations Administered:  Influenza Vaccine # 1:    Vaccine Type: Fluvax 3+    Site: right deltoid    Mfr: GlaxoSmithKline    Dose: 0.5 ml    Route: IM    Given by: Theresia Lo RN    Exp. Date: 10/27/2010    Lot #: [id number]    VIS given: 11/24/09 version given February 10, 2010.  Flu Vaccine Consent Questions:    Do you have a history of severe allergic reactions to this vaccine? no    Any prior history of allergic reactions to egg and/or gelatin? no    Do you have a sensitivity to the preservative Thimersol? no    Do you have a past history of Guillan-Barre Syndrome? no    Do you currently have an acute febrile illness? no    Have you ever had a severe reaction to latex? no    Vaccine information given and explained to patient? yes  Orders Added: 1)  Flu Vaccine 8yrs + [90658] 2)  Admin 1st Vaccine [90471]     Vital Signs:  Patient profile:   55 year old male Temp:     98.3 degrees F  Vitals Entered By: Theresia Lo RN (February 10, 2010 2:16 PM)

## 2010-06-03 NOTE — Assessment & Plan Note (Signed)
Summary: bumps all over body/oh park pt/eo   Vital Signs:  Patient profile:   55 year old male Height:      67.5 inches Weight:      281.6 pounds BMI:     43.61 Temp:     98.2 degrees F oral Pulse rate:   101 / minute BP sitting:   153 / 97  (left arm) Cuff size:   large  Vitals Entered By: Garen Grams LPN (May 13, 2010 2:21 PM) CC: bumps all over x months Is Patient Diabetic? No Pain Assessment Patient in pain? no        Primary Care Provider:  Myrtie Soman  MD  CC:  bumps all over x months.  History of Present Illness: 1. Bumps:  Pt has had bumps on different parts of his body for the past couple of months.  He decided to come in today because he is concerned that it could be a staph infection or cancer.  He had skin tags in his arm pits removed a couple of months ago and since then he has noticed these bumps.  Most of the bumps are near his right arm pit or his right flank.  There was a large one that his sister punctured with a needle and had a lot of purulent drainage.  PMHx: DMII - poorly controlled  ROS: denies fevers, chills, malaise, weight loss  Habits & Providers  Alcohol-Tobacco-Diet     Tobacco Status: never  Current Medications (verified): 1)  Aspirin Childrens 81 Mg Chew (Aspirin) .... Take 1 Tablet By Mouth Once A Day 2)  Accupril 40 Mg  Tabs (Quinapril Hcl) .Marland Kitchen.. 1 By Mouth Two Times A Day. 3)  Toprol Xl 100 Mg  Tb24 (Metoprolol Succinate) .... 1.5 By Mouth Qday 4)  Norvasc 10 Mg Tabs (Amlodipine Besylate) .Marland Kitchen.. 1 By Mouth Once Daily 5)  Protonix 40 Mg  Tbec (Pantoprazole Sodium) .... Once Daily 6)  Clarinex 5 Mg  Tabs (Desloratadine) .Marland Kitchen.. 1 By Mouth Qday 7)  Albuterol 90 Mcg/act Aers (Albuterol) .Marland Kitchen.. 1-2 Puffs Inhaled Q 4-6 Hr Prn 8)  Allopurinol 300 Mg Tabs (Allopurinol) .Marland Kitchen.. 1 By Mouth Qday 9)  Colchicine 0.6 Mg Tabs (Colchicine) .Marland Kitchen.. 1 By Mouth Every 1-2 Hrs As Needed Gout 10)  Metformin Hcl 1000 Mg Tabs (Metformin Hcl) .... Take 1 Tablet By  Mouth Two Times A Day 11)  Ibuprofen 800 Mg Tabs (Ibuprofen) .Marland Kitchen.. 1 By Mouth 3x Daily 12)  Flonase 50 Mcg/act Susp (Fluticasone Propionate) .... 2 Spr Once Daily  Dispense 2 Month Supply 13)  Glipizide 5 Mg  Tb24 (Glipizide) .... Take 1 Tab By Mouth Daily 14)  Lipitor 20 Mg Tabs (Atorvastatin Calcium) .... Take 1 Tab By Mouth Qhs 15)  Accu-Chek Aviva  Strp (Glucose Blood) .... For Checking Sugars Two Times A Day; Dispense 2 Month's Supply 16)  Ibuprofen 800 Mg Tabs (Ibuprofen) .... Take 1 Tab By Mouth Every 6 Hours As Needed For Pain 17)  Glucotrol 10 Mg Tabs (Glipizide) .... Take One Tablet Twice A Day. 18)  Smz-Tmp Ds 800-160 Mg Tabs (Sulfamethoxazole-Trimethoprim) .Marland Kitchen.. 1 Tab By Mouth Twice A Day For 10 Days 19)  Hibiclens 4 % Liqd (Chlorhexidine Gluconate) .... Use As Your Soap For The Next 7 Days  Allergies: No Known Drug Allergies  Past History:  Past Medical History: Reviewed history from 12/04/2009 and no changes required. 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 Lives with father and step-mother.   He does not work due to depression - out of work since 1992.   He does not drink or smoke.   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   2 sisters and 1/2 brother here in [place]. Has MAP and Rudell Cobb to help with medical expenses; on MAP program for medications.  Transportation is difficult.  Has trouble making office visits. Not sure when he'll be able to come back to the office (due to father not giving him gas money)  Physical Exam  General:  Vitals reviewed.  Afebrile.  no acute distress Lungs:  CTAB Heart:  RRR, no m/r/g Abdomen:  NABS, soft, non-tender throughout Distended with 6 cm hernia, non-reducible Msk:  no joint  tenderness and no joint swelling.   Skin:  Multiple areas of cellulitis +/- abscess under his right armpit and on his right flank and trunk Axillary Nodes:  + axillary LAD   Impression & Recommendations:  Problem # 1:  CELLULITIS AND ABSCESS OF OTHER SPECIFIED SITE [phone]) Assessment New  No areas that look like they could be drained.  Will put him on BactrimDS.  Advised to use warm compresses at least twice a day.  Also advised for him to use the Chlorhexidine wash for 1 week for decontamination.  Told him that if any of the spots get larger or more fluctuant to let us know because they might need to be drained.  Pt to follow up in 10 days to recheck. His updated medication list for this problem includes:    Smz-tmp Ds 800-160 Mg Tabs (Sulfamethoxazole-trimethoprim) .Marland Kitchen... 1 tab by mouth twice a day for 10 days  Orders: [facility]- Est  Level 4 (81191)  Complete Medication List: 1)  Aspirin Childrens 81 Mg Chew (Aspirin) .... Take 1 tablet by mouth once a day 2)  Accupril 40 Mg Tabs (Quinapril hcl) .Marland Kitchen.. 1 by mouth two times a day. 3)  Toprol Xl 100 Mg Tb24 (Metoprolol succinate) .... 1.5 by mouth qday 4)  Norvasc 10 Mg Tabs (Amlodipine besylate) .Marland Kitchen.. 1 by mouth once daily 5)  Protonix 40 Mg Tbec (Pantoprazole sodium) .... Once daily 6)  Clarinex 5 Mg Tabs (Desloratadine) .Marland Kitchen.. 1 by mouth qday 7)  Albuterol 90 Mcg/act Aers (Albuterol) .Marland Kitchen.. 1-2 puffs inhaled q 4-6 hr prn 8)  Allopurinol 300 Mg Tabs (Allopurinol) .Marland Kitchen.. 1 by mouth qday 9)  Colchicine 0.6 Mg Tabs (Colchicine) .Marland Kitchen.. 1 by mouth every 1-2 hrs as needed gout 10)  Metformin Hcl 1000 Mg Tabs (Metformin hcl) .... Take 1 tablet by mouth two times a day 11)  Ibuprofen 800 Mg Tabs (Ibuprofen) .Marland Kitchen.. 1 by mouth 3x daily 12)  Flonase 50 Mcg/act Susp (Fluticasone propionate) .... 2 spr once daily  dispense 2 month supply 13)  Glipizide 5 Mg Tb24 (Glipizide) .... Take 1 tab by mouth daily 14)  Lipitor 20 Mg Tabs (Atorvastatin calcium) .... Take  1 tab by mouth qhs 15)  Accu-chek Aviva Strp (Glucose blood) .... For checking sugars two times a day; dispense 2 month's supply 16)  Ibuprofen 800 Mg Tabs (Ibuprofen) .... Take 1 tab by mouth every 6 hours as needed for pain 17)  Glucotrol 10 Mg Tabs (Glipizide) .... Take one tablet twice a day. 18)  Smz-tmp Ds 800-160 Mg Tabs (  Sulfamethoxazole-trimethoprim) .Marland Kitchen.. 1 tab by mouth twice a day for 10 days 19)  Hibiclens 4 % Liqd (Chlorhexidine gluconate) .... Use as your soap for the next 7 days  Patient Instructions: 1)  This does look like a bacterial infection and it is possible that it could be staph 2)  there isn't anything that we can drain today but if any of the spots get large, swollen, and soft then come back in and we can cut it open 3)  I am going to treat you with an antibiotic.  I also want you to use this soap for the next week 4)  Please schedule a follow up appointment in 1 week Prescriptions: HIBICLENS 4 % LIQD (CHLORHEXIDINE GLUCONATE) Use as your soap for the next 7 days  #1 x 1   Entered and Authorized by:   Angelena Sole MD   Signed by:   Angelena Sole MD on 05/13/2010   Method used:   Print then Give to Patient   RxID:   [id number] SMZ-TMP DS 800-160 MG TABS (SULFAMETHOXAZOLE-TRIMETHOPRIM) 1 tab by mouth twice a day for 10 days  #20 x 0   Entered and Authorized by:   Angelena Sole MD   Signed by:   Angelena Sole MD on 05/13/2010   Method used:   Print then Give to Patient   RxID:   [id number]    Orders Added: 1)  [facility]- Est  Level 4 [63875]

## 2010-06-28 ENCOUNTER — Other Ambulatory Visit: Payer: Self-pay | Admitting: *Deleted

## 2010-06-28 DIAGNOSIS — I10 Essential (primary) hypertension: Secondary | ICD-10-CM

## 2010-06-28 MED ORDER — METOPROLOL SUCCINATE ER 100 MG PO TB24: ORAL_TABLET | ORAL | Status: DC

## 2010-06-28 NOTE — Telephone Encounter (Signed)
Received refill request for Toprol XL . Spoke with patient and advised  to call for appointment before further refill is needed. .  Called in enough for 2 months .

## 2010-07-01 ENCOUNTER — Encounter: Payer: Self-pay | Admitting: Family Medicine

## 2010-07-01 ENCOUNTER — Other Ambulatory Visit: Payer: Self-pay | Admitting: Family Medicine

## 2010-07-15 ENCOUNTER — Encounter: Payer: Self-pay | Admitting: Family Medicine

## 2010-07-15 ENCOUNTER — Ambulatory Visit (INDEPENDENT_AMBULATORY_CARE_PROVIDER_SITE_OTHER): Payer: Self-pay | Admitting: Family Medicine

## 2010-07-15 VITALS — BP 132/86 | HR 93 | Temp 98.3°F | Ht 67.5 in | Wt 280.0 lb

## 2010-07-15 DIAGNOSIS — L03316 Cellulitis of umbilicus: Secondary | ICD-10-CM | POA: Insufficient documentation

## 2010-07-15 DIAGNOSIS — I10 Essential (primary) hypertension: Secondary | ICD-10-CM

## 2010-07-15 DIAGNOSIS — E669 Obesity, unspecified: Secondary | ICD-10-CM

## 2010-07-15 DIAGNOSIS — E782 Mixed hyperlipidemia: Secondary | ICD-10-CM

## 2010-07-15 DIAGNOSIS — L03818 Cellulitis of other sites: Secondary | ICD-10-CM

## 2010-07-15 DIAGNOSIS — L02818 Cutaneous abscess of other sites: Secondary | ICD-10-CM

## 2010-07-15 DIAGNOSIS — M109 Gout, unspecified: Secondary | ICD-10-CM

## 2010-07-15 DIAGNOSIS — R079 Chest pain, unspecified: Secondary | ICD-10-CM | POA: Insufficient documentation

## 2010-07-15 DIAGNOSIS — L03319 Cellulitis of trunk, unspecified: Secondary | ICD-10-CM

## 2010-07-15 LAB — LDL CHOLESTEROL, DIRECT: Direct LDL: 57 mg/dL

## 2010-07-15 LAB — CONVERTED CEMR LAB: Direct LDL: 57 mg/dL

## 2010-07-15 MED ORDER — ALBUTEROL 90 MCG/ACT IN AERS: 1.0000 | INHALATION_SPRAY | RESPIRATORY_TRACT | Status: DC | PRN

## 2010-07-15 MED ORDER — FLUTICASONE PROPIONATE 50 MCG/ACT NA SUSP: 2.0000 | Freq: Every day | NASAL | Status: DC

## 2010-07-15 MED ORDER — PANTOPRAZOLE SODIUM 40 MG PO TBEC: 40.0000 mg | DELAYED_RELEASE_TABLET | Freq: Every day | ORAL | Status: DC

## 2010-07-15 MED ORDER — ATORVASTATIN CALCIUM 20 MG PO TABS: 20.0000 mg | ORAL_TABLET | Freq: Every day | ORAL | Status: DC

## 2010-07-15 MED ORDER — ALLOPURINOL 300 MG PO TABS: 300.0000 mg | ORAL_TABLET | Freq: Every day | ORAL | Status: DC

## 2010-07-15 MED ORDER — METOPROLOL SUCCINATE ER 100 MG PO TB24: ORAL_TABLET | ORAL | Status: DC

## 2010-07-15 MED ORDER — METFORMIN HCL 1000 MG PO TABS: 1000.0000 mg | ORAL_TABLET | Freq: Two times a day (BID) | ORAL | Status: DC

## 2010-07-15 MED ORDER — GLIPIZIDE 10 MG PO TABS: 10.0000 mg | ORAL_TABLET | Freq: Two times a day (BID) | ORAL | Status: DC

## 2010-07-15 MED ORDER — NITROGLYCERIN 0.4 MG SL SUBL: 0.4000 mg | SUBLINGUAL_TABLET | SUBLINGUAL | Status: DC | PRN

## 2010-07-15 MED ORDER — AMLODIPINE BESYLATE 10 MG PO TABS: 10.0000 mg | ORAL_TABLET | Freq: Every day | ORAL | Status: DC

## 2010-07-15 MED ORDER — QUINAPRIL HCL 40 MG PO TABS: 40.0000 mg | ORAL_TABLET | Freq: Two times a day (BID) | ORAL | Status: DC

## 2010-07-15 MED ORDER — ASPIRIN 81 MG PO CHEW: 81.0000 mg | CHEWABLE_TABLET | Freq: Every day | ORAL | Status: DC

## 2010-07-15 NOTE — Patient Instructions (Signed)
Try some OTC yeast cream for the skin around your hernia. Please send me a list of medications that is covered by MAT, and I will try to pick a different allergy medicine for you. For now, use the nasal spray daily (2 sprays each nostril). For your diabetes: Take the Glipizde 10mg , 2 tablets every day. Continue to take the metformin. We will check your A1c again today. Let's try to lose weight and make better diet decisions.  For your chest pain: I think you need a stress test at some point. I am concerned because you are at high risk to get a heart attack. So when you feel like you can get this done, please let me know, and I will refer you. In the meantime, use the nitroglycerin if you need it.  For your catarcts: call the doctor you saw before and ask to see them again.  If you want to get your other skin tags removed, please make an appointment just to get that done.   Please follow-up with me in 2-3 months.

## 2010-07-15 NOTE — Assessment & Plan Note (Signed)
Occasional, mild chest pain bilateral chest that lasts for a day. Broad differential but there is potential that it may be angina especially in light of multiple comorbidities. Would like patient to get stress test but patient defers at this time. Would consider "later". Will give Rx for NTG prn. Advised to use for chest pain and to go to the ED if CP improved with it.

## 2010-07-15 NOTE — Assessment & Plan Note (Signed)
Abscesses nonfluctuant, erythematous just around borders. Does not appear infected or drainable at this time. Afebrile today. Advised keep area clean and continue antibiotic ointment. Advised to call if fevers or worsening cellulitis.

## 2010-07-15 NOTE — Assessment & Plan Note (Signed)
Controlled. Has not had exacerbation/neede colchicine for at least a year. Continue allopurinol, colchicine prn.

## 2010-07-15 NOTE — Progress Notes (Signed)
  Subjective:    Patient ID: Jacob [name], male    DOB: 1956/02/18, 55 y.o.   MRN: [id number]  HPI Patient here for medication refills.   1. Diabetes Two different doses of glipizide were on his medication reconciliation sheet (from 05/2010 visit) that he says he is compliant with. So unsure which dose he is actually taking; he denies taking both doses.  ROS: worsening blurred vision (supposedly from cataracts); denies paresthesias.   2. Skin infections/abscesses He was seen in January for skin infection. He has gotten other small superficial skin abscess on his legs and torso since then. This has been occurring for past 4 months. He punctures and drains them with a needle and then applies antibiotic ointment. In January, he was given a 10-day course of antibiotics ROS: ?subjective fevers  3. Chest pain  Occurring on and off for past several days. Not associated with activity. Nothing makes it better. Pain is diffusely across chest.  Has been offered stress testing in past. He was not interested in it then. He did not think he could tolerate the exercise. He thinks he may be able to walk up 2 flights of steps, but he thinks it will be very strenuous.   Review of Systems     Objective:   Physical Exam  Constitutional:       Obese  Cardiovascular: Normal rate, regular rhythm, normal heart sounds and intact distal pulses.   No murmur heard. Pulmonary/Chest: Effort normal and breath sounds normal. No respiratory distress. He exhibits no tenderness.  Abdominal: Bowel sounds are normal.       Large umbilical hernia. Some of it is reducible. No pain. Erythematous on right-side of hernia between skin folds. No tenderness. No foul-smell.   Psychiatric:       Appears anxious.           Assessment & Plan:

## 2010-07-15 NOTE — Assessment & Plan Note (Signed)
In skin folds around large umbilical hernia. Recommended OTC yeast preparation.

## 2010-07-15 NOTE — Assessment & Plan Note (Signed)
Diabetes is still poorly controlled. Although there is discrepancy between which dose of glipizide he is taking (there were 2 on his med rec sheet from January 2012 visit and he wasn't sure which dose he was on), I think he is taking his medications. Patient needs insulin but is resistant to it at this time for insulin would be next step in his diabetes management. Patient is difficult to work with since he seems resigned to his condition, wants treatment/medications and things done for him, but does not take initiative for himself. He refuses to make dietary/exercise modifications but his worsening and persistent symptoms do cause him anxiety. His A1c is improved from previously but still >9.0. Will follow-up in 2-3 months. Asked patient to think about starting insulin since his uncontrolled diabetes puts him at risk for many complications, including kidney problems, stroke/heart attack, and makes him more susceptible to getting skin infection. He also should get stress test. Offered Cardiolite versus exercise but not interested at this time. Wants time to think over and to make his other appointments (e.g., dental) first.

## 2010-07-15 NOTE — Assessment & Plan Note (Signed)
Discussed need for weight reduction. [name] aware he needs to lose weight (told about 40 pounds at last visit) to be considered candidate for hernia surgery. But he will not make dietary/exercise changes.

## 2010-09-10 ENCOUNTER — Telehealth: Payer: Self-pay | Admitting: Family Medicine

## 2010-09-10 NOTE — Telephone Encounter (Signed)
Received rx for albuterol, has 2 sets of directions, needs clarification.

## 2010-09-10 NOTE — Telephone Encounter (Signed)
Clarified that it should read 1 to 2 puffs q 4 to 6 hours.
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
## 2010-09-17 NOTE — Discharge Summary (Signed)
NAMEANGELO, [name] NO.:  [id number]   MEDICAL RECORD NO.:  [id number]          PATIENT TYPE:  INP   LOCATION:  2027                         FACILITY:  [facility]   PHYSICIAN:  Melina Fiddler, MD DATE OF BIRTH:  February 25, 1956   DATE OF ADMISSION:  06/02/2004  DATE OF DISCHARGE:  06/04/2004                                 DISCHARGE SUMMARY   PRIMARY CARE PHYSICIAN:  Alvira Philips, M.D., [facility].   DISCHARGE DIAGNOSES:  1.  High blood pressure.  2.  Obesity.  3.  Gastroesophageal reflux disease.  4.  Depression.   DISCHARGE MEDICATIONS:  1.  Aspirin 325 mg one tablet daily.  2.  Metoprolol 50 mg p.o. daily.  3.  Enalapril 5 mg p.o. daily.   HOSPITAL COURSE:  Problem 1. Cardiovascular.  This is a 55 year old male  that was admitted with a history of intermittent chest pain and shortness of  breath.  He was seen at the family practice center on the date of admission  for a new patient appointment and was found to have elevated systolic  pressure in the 200s and was given a dose of Toprol.  The patient presented  with a sudden drop in his blood pressure and also presented with shortness  of breath and was also given 325 mg of aspirin and was sent to the emergency  department.  At the emergency department he had a chest x-ray done that  showed elevation of the left hemidiaphragm and atelectasis in the lower left  lobe.  There was questionable widening of the mediastinum and a chest CT was  ordered to rule out aortic dissection.  The chest/abdominal CT showed no  evidence for aortic dissection and was significant for a small periumbilical  hernia, fatty changes in the liver and cholelithiasis.   On admission, the patient had a systolic blood pressure of 170, diastolic  113, pulse of 88, respirations 21, temperature 98.3, and pulse oximetry was  99% on room air.  The patient also had two sets of cardiac markers that were  within normal limits and  were done to rule out a myocardial infarction.  He  also had a CBC, CMET, urinalysis, and a urine drug screen on admission that  were all within normal limits.  During the length of admission, the patient  was not complaining of chest pain or shortness of breath.  The blood  pressure was still elevated during the length of admission but was trending  down.  The patient was treated with aspirin 325 mg daily.  Since admission,  he was started on metoprolol 50 mg daily and enalapril 5 mg daily and his  blood pressure was trending down.  On discharge day his temperature was 99,  heart rate 88 to 97, respiratory rate 20, blood pressure was 154/103 and he  was saturating above 95% on room air.  The patient had a benign clinical  exam with a regular rate and rhythm, no murmurs, rubs or gallops, and strong  peripheral pulses, lungs were clear to auscultation bilaterally with no  increased work of  breathing, no crackles or wheezing.  Abdomen was obese but  no hepatosplenomegaly and no masses.  Extremities had no edema.   The patient was consulted with a cardiologist.  A 2-D echo was performed and  there was an equivocal wall motion abnormality that was not shown on  Cardiolite.  The final report for the Cardiolite test was no evidence of  pharmacological induced myocardial ischemia or myocardial scar, and the left  ventricular ejection fraction was 52%.   Problem 2. Respiratory.  The patient's symptoms of shortness of breath and  the finding of atelectasis in the left lower lobe likely were related to  poor respiratory effort.  The patient reports he is very sedentary and also  is obese.  The patient was provided an incentive spirometer and the CT scan  was normal.  He did not require any oxygen supplementation and was  saturating always above 95% on room air.   Problem 3. Endocrine, questionable metabolic syndrome in this patient.  The  patient had a hemoglobin A1c that was 6.8 and it was  recommended he follow a  diet low in saturated fat and continue the carbohydrate modified diet that  was started in the hospital and patient encouraged in weight reduction.  The  patient also had a TSH level that was within normal limits and had a lipid  profile that showed total cholesterol 184, triglycerides 292, HDL 33, and  LDL 93.   Problem 4. Depression.  The patient stated he had over a 12-year history of  depression.  [name] not currently on any medication and has not been seen by  a doctor for over 10 years.  He reported that he is at baseline at this time  and this is an issue to be followed up as an outpatient.   CONSULTATIONS:  Cardiology was consulted with Dr. Willa Rough who followed  up with the patient in the hospital.   STUDIES, PROCEDURES:  The patient had a chest x-ray AP and lateral and one  portable, had a CT of abdomen and chest, had a 2-D echocardiogram and had an  adenosine Cardiolite.   PENDING RESULTS TO BE FOLLOWED:  None.   SUGGESTED FOLLOW-UP ITEMS:  The patient is to be followed up at the [facility] on Thursday, June 10, 2004 at 2:30 p.m. by Dr. Virgia Land  who is her primary care doctor.  The patient will need a blood pressure  check and antihypertensive medications should be adjusted as needed.      AM/MEDQ  D:  06/06/2004  T:  06/07/2004  Job:  [id number]   cc:   Alvira Philips, M.D.  [facility].  Family Prac. Resident  [place]  [place] [address]  Fax: [phone]

## 2010-09-17 NOTE — Consult Note (Signed)
NAMECRAIGORY, TOSTE NO.:  [id number]   MEDICAL RECORD NO.:  [id number]          PATIENT TYPE:  INP   LOCATION:  2027                         FACILITY:  [facility]   PHYSICIAN:  Willa Rough, M.D.     DATE OF BIRTH:  1956-04-02   DATE OF CONSULTATION:  06/02/2004  DATE OF DISCHARGE:                                   CONSULTATION   HISTORY OF PRESENT ILLNESS:  Mr. Busic is admitted by the teaching service  today.  He has had some problems with chest pain and some shortness of  breath.  He came to the clinic today and was significantly hypertensive.  At  some point, he received some medicines, and at another point, he had  decreased blood pressure.  [name] now quite stable.  The patient gives a  history of a multitude of various complaints.  [name] admitted for further  evaluation.  We are asked to see him to help evaluate his cardiac status.   PAST MEDICAL HISTORY:   ALLERGIES:  No known allergies.   MEDICATIONS:  No known medications.   SOCIAL HISTORY:  The patient lives with father and step-mother.  He does not  work due to depression.  He does not drink or smoke.   FAMILY HISTORY:  No strong family history of coronary disease.   REVIEW OF SYSTEMS:  At this time, the patient has several complaints.  He  has had a mild headache.  He mentions some hematochezia in the last several  months.  He says he has some chest pain and shortness of breath.  There is  also weight gain and polydipsia.  The patient also says he has a significant  cough at night time, and he has symptoms of gout from time to time.   PHYSICAL EXAMINATION:  VITAL SIGNS:  Blood pressure at this time is 145/90.  LUNGS:  Reveal question of a few crackles.  NECK:  Normal.  HEENT:  No marked abnormalities.  CARDIAC:  An S1 with an S2.  There are no clicks or significant murmurs.  ABDOMEN:  Benign.  EXTREMITIES:  He has no significant peripheral edema.  MUSCULOSKELETAL:  There are no major  musculoskeletal deformities.   EKG:  Reveals mild increased voltage with some nonspecific ST-T wave  changes.   CHEST X-RAY:  Reveals elevation of the left hemidiaphragm.  There is the  suggestion of some cardiomegaly.  There is some right peritracheal soft  tissue thickening.   LABORATORY DATA:  Cardiac enzymes are negative to this point.   PROBLEMS:  1.  Some chest pain and shortness of breath.  At this time, there is no      proof of coronary disease.  A 2-dimensional echo was ordered and done      this afternoon, but not yet reviewed.  I have ordered an adenosine      Cardiolite scan for tomorrow.  2.  Hypertension with a hypotensive response at some point.  The exact      etiology is not clear.  3.  Question of diabetes.  4.  Question of hematochezia.  5.  Question of gout.  6.  Depression.  7.  Peritracheal nodes on the chest x-ray.   From the cardiac viewpoint, I would suggest following up on the 2-D echo and  checking further enzymes.  Adenosine Cardiolite is to be done, and it is  ordered.  The primary care team has ordered a chest CT.  Further  recommendations will be made when these results are available.      JK/MEDQ  D:  06/02/2004  T:  06/02/2004  Job:  [id number]

## 2010-11-30 ENCOUNTER — Telehealth: Payer: Self-pay | Admitting: Family Medicine

## 2010-11-30 NOTE — Telephone Encounter (Signed)
[facility]. Told him that he would need referral to go back to them for cataracts.  He has the orange card and would like for [place] to approve for him to go.  He already has an appt on 8/20.

## 2010-12-01 DIAGNOSIS — H269 Unspecified cataract: Secondary | ICD-10-CM | POA: Insufficient documentation

## 2010-12-01 NOTE — Telephone Encounter (Signed)
Jacob [name] could you put in ophtho referral and i will fax it to [facility], thanks.

## 2010-12-01 NOTE — Telephone Encounter (Signed)
Ordered! Thanks Asha.

## 2010-12-01 NOTE — Telephone Encounter (Signed)
Referral faxed to [facility], left message on vm informing patient.

## 2010-12-27 ENCOUNTER — Telehealth: Payer: Self-pay | Admitting: Family Medicine

## 2010-12-27 NOTE — Telephone Encounter (Signed)
Left message on voicemail informing patient that referral was sent through [facility] and that if they didn't have his specialist available at the time the wait could be a few months. Told him if problem with eye was getting worse to come back in to see MD.

## 2010-12-27 NOTE — Telephone Encounter (Signed)
[name] is calling regarding an ophthamology appt he's been waiting for about a month. He had seen [facility] a year ago and was told that he need to be recertified for a new appt.  Explain to him that [facility] needed to be involved for the referral.  Please contact patient about this.

## 2011-01-06 ENCOUNTER — Encounter: Payer: Self-pay | Admitting: Family Medicine

## 2011-01-06 NOTE — Assessment & Plan Note (Signed)
Documentation only. No diabetic retinopathy on opthalmology fundus examination.

## 2011-01-06 NOTE — Progress Notes (Signed)
  Subjective:    Patient ID: Jacob [name], male    DOB: 06-25-1955, 55 y.o.   MRN: [id number]  HPI    Review of Systems     Objective:   Physical Exam        Assessment & Plan:

## 2011-02-01 ENCOUNTER — Other Ambulatory Visit: Payer: Self-pay | Admitting: Family Medicine

## 2011-02-01 MED ORDER — ALBUTEROL 90 MCG/ACT IN AERS: 1.0000 | INHALATION_SPRAY | RESPIRATORY_TRACT | Status: DC | PRN

## 2011-02-22 ENCOUNTER — Other Ambulatory Visit: Payer: Self-pay | Admitting: Family Medicine

## 2011-02-22 DIAGNOSIS — I10 Essential (primary) hypertension: Secondary | ICD-10-CM

## 2011-02-22 MED ORDER — METOPROLOL SUCCINATE ER 100 MG PO TB24: ORAL_TABLET | ORAL | Status: DC

## 2011-05-03 HISTORY — PX: COLONOSCOPY W/ BIOPSIES AND POLYPECTOMY: SHX1376

## 2011-06-01 ENCOUNTER — Encounter: Payer: Self-pay | Admitting: Family Medicine

## 2011-06-01 ENCOUNTER — Ambulatory Visit (INDEPENDENT_AMBULATORY_CARE_PROVIDER_SITE_OTHER): Payer: Self-pay | Admitting: Family Medicine

## 2011-06-01 VITALS — BP 128/76 | HR 84 | Temp 98.6°F | Ht 67.6 in | Wt 280.0 lb

## 2011-06-01 DIAGNOSIS — K921 Melena: Secondary | ICD-10-CM | POA: Insufficient documentation

## 2011-06-01 DIAGNOSIS — E782 Mixed hyperlipidemia: Secondary | ICD-10-CM

## 2011-06-01 DIAGNOSIS — I10 Essential (primary) hypertension: Secondary | ICD-10-CM

## 2011-06-01 LAB — CBC
Hemoglobin: 14.6 g/dL (ref 13.0–17.0)
MCHC: 32.1 g/dL (ref 30.0–36.0)
WBC: 10 10*3/uL (ref 4.0–10.5)

## 2011-06-01 LAB — BASIC METABOLIC PANEL
CO2: 26 mEq/L (ref 19–32)
Calcium: 9 mg/dL (ref 8.4–10.5)
Creat: 1.01 mg/dL (ref 0.50–1.35)

## 2011-06-01 LAB — POCT GLYCOSYLATED HEMOGLOBIN (HGB A1C): Hemoglobin A1C: 8.9

## 2011-06-01 NOTE — Assessment & Plan Note (Signed)
Controlled on Norvasc, metoprolol, and quinapril. Continue. Follow-up in 6 months.

## 2011-06-01 NOTE — Assessment & Plan Note (Addendum)
Past 6 months. Will check CBC today. Patient given hemoccult cards. Will follow-up in 1 week. Will perform rectal exam at that time. Patient also asked to hold aspirin. Patient due for colonoscopy; he has no insurance but does have orange card. Will schedule him for colonoscopy.   UPDATE: Hemeoccult positive 3/3. Hgb 14.6, not tachycardic. Called patient and left message advising him to continue to hold aspirin. Follow-up in 1 month or sooner if significant increase blood in stool, dizzy, or other worrisome symptoms. Will increase priority of GI consult from routine (for colonoscopy) to urgent.

## 2011-06-01 NOTE — Assessment & Plan Note (Addendum)
HgbA1c improved from 9.5 to 8.9, however, still not controlled. Patient sometimes has difficulty paying for metformin. He has medications now. Continue glipizide. He does not want to start insulin. Encouraged weight loss. He declined nutrition or Health and Wellness consultation. Follow-up in 3 months.

## 2011-06-01 NOTE — Patient Instructions (Addendum)
Try to take the metformin.  Weight loss will help your diabetes. Let me know if you would like to talk to someone regarding this.  Stop the Protonix or take as needed for your acid reflux symptoms.   Please make an appointment to come in next week. Please bring your stool cards at that time.  Stop your aspirin for the next week.  We will discuss your lab results at your next visit.

## 2011-06-01 NOTE — Progress Notes (Signed)
  Subjective:    Patient ID: Jacob [name], male    DOB: 1956-01-12, 56 y.o.   MRN: [id number]  HPI Follow-up of:  1. HTN Does not measure blood pressures at home Compliant with medications 6/7 days of week ROS: occasional substernal chest pain with rest, lasts a few minutes. No chest pain today. Denies difficult breathing or nausea during these episodes. Denies dyspnea.  2. Diabetes Does not measure sugars at home Complaint with glipizide 6/7 days of week. [name] inconsistent on taking metformin since this is one medication he needs to pay for ($5). He just re-started it. ROS: complaining of tingling in his feet occasionally; denies vision changes   3. Blood in stool for the past 6 months He notices it when he wipes. Denies lightheadedness/falls. [name] taking aspirin.  No family history of colon cancer.  ROS: denies abdominal pain.   Review of Systems Per HPI.    Objective:   Physical Exam Gen: NAD Psych: appropriate, appears anxious CV: RRR, no m/r/g Pulm: CTAB without w/r/r Abd: distended, obese, NABS, soft, non-tender Ext: no edema Feet: no lesions, sensation intact, 2+ pedal pulses bilaterally    Assessment & Plan:

## 2011-06-02 ENCOUNTER — Encounter: Payer: Self-pay | Admitting: Family Medicine

## 2011-06-29 LAB — HEMOCCULT GUIAC POC 1CARD (OFFICE)
Card #2 Fecal Occult Blod, POC: POSITIVE
Card #3 Fecal Occult Blood, POC: POSITIVE
Fecal Occult Blood, POC: POSITIVE

## 2011-06-29 NOTE — Progress Notes (Signed)
Addended by: [place], Allyiah Gartner on: 06/29/2011 04:57 PM   Modules accepted: Orders

## 2011-07-01 ENCOUNTER — Encounter: Payer: Self-pay | Admitting: Family Medicine

## 2011-07-05 ENCOUNTER — Telehealth: Payer: Self-pay | Admitting: Family Medicine

## 2011-07-05 ENCOUNTER — Encounter: Payer: Self-pay | Admitting: Gastroenterology

## 2011-07-05 NOTE — Telephone Encounter (Signed)
Pt is returning call about referral °

## 2011-07-05 NOTE — Progress Notes (Signed)
Addended by: Madolyn Frieze, Marylene Land J on: 07/05/2011 01:19 PM   Modules accepted: Orders

## 2011-07-05 NOTE — Telephone Encounter (Signed)
Sorry I meant to start a phone note :-(   LMOVM again asking pt to return call.  Please inform pt when he calls:  Dr. [name] ([facility]) [address], 3rd floor March 25th, 2013 @ 10am. Be sure to take Jaynee Eagles Card  Also mailed reminder. Isaid Salvia, Maryjo Rochester

## 2011-07-25 ENCOUNTER — Ambulatory Visit (INDEPENDENT_AMBULATORY_CARE_PROVIDER_SITE_OTHER): Payer: Self-pay | Admitting: Gastroenterology

## 2011-07-25 ENCOUNTER — Encounter: Payer: Self-pay | Admitting: Gastroenterology

## 2011-07-25 VITALS — BP 136/72 | HR 80 | Ht 69.0 in | Wt 278.0 lb

## 2011-07-25 DIAGNOSIS — K429 Umbilical hernia without obstruction or gangrene: Secondary | ICD-10-CM

## 2011-07-25 DIAGNOSIS — K921 Melena: Secondary | ICD-10-CM

## 2011-07-25 NOTE — Patient Instructions (Signed)
Your Colonoscopy is scheduled on 07/28/2011 at 2pm Your Appointment at [facility] is scheduled on 08/04/2011 to arrive at 9:30am

## 2011-07-25 NOTE — Progress Notes (Signed)
History of Present Illness:  Jacob [name] is a 56 year old white male referred at the request of Dr. Madolyn Frieze for evaluation of rectal bleeding. For the past 8 months he noted bright red blood mixed with his stools and in the toilet water. He's without change in bowel habits, abdominal or rectal pain. Hemoccults have tested positive. Last hematocrit was 45.  The patient has a history of an umbilical hernia. He's been evaluated by surgery in the past and was told that he is not a great surgical candidate. He has discomfort related to the hernia.    Past Medical History  Diagnosis Date  . Umbilical hernia   . Depression   . Diabetes mellitus   . Hypertension   . Obesity    No past surgical history on file. family history is negative for Colon cancer. Current Outpatient Prescriptions  Medication Sig Dispense Refill  . albuterol (PROVENTIL,VENTOLIN) 90 MCG/ACT inhaler Inhale 1-2 puffs into the lungs every 4 (four) hours as needed for wheezing. 1-2 puffs inhaled every 4-6 hours as needed  17 g  3  . allopurinol (ZYLOPRIM) 300 MG tablet Take 300 mg by mouth as needed.      Marland Kitchen amLODipine (NORVASC) 10 MG tablet Take 1 tablet (10 mg total) by mouth daily.  90 tablet  3  . atorvastatin (LIPITOR) 20 MG tablet Take 1 tablet (20 mg total) by mouth at bedtime.  90 tablet  3  . desloratadine (CLARINEX) 5 MG tablet Take 5 mg by mouth daily.        . fluticasone (FLONASE) 50 MCG/ACT nasal spray 2 sprays by Nasal route daily. Dispense 2 month supply  16 g  3  . glipiZIDE (GLUCOTROL) 10 MG tablet Take 1 tablet (10 mg total) by mouth 2 (two) times daily.  180 tablet  3  . glucose blood (ACCU-CHEK AVIVA) test strip for checking sugars two times a day; dispense 2 month's supply       . ibuprofen (ADVIL,MOTRIN) 800 MG tablet Take 800 mg by mouth every 6 (six) hours as needed. For pain       . metoprolol succinate (TOPROL-XL) 100 MG 24 hr tablet Take 100 mg by mouth daily.      . quinapril (ACCUPRIL) 40 MG tablet  Take 40 mg by mouth daily.      Marland Kitchen DISCONTD: allopurinol (ZYLOPRIM) 300 MG tablet Take 1 tablet (300 mg total) by mouth daily.  90 tablet  3  . DISCONTD: metoprolol (TOPROL-XL) 100 MG 24 hr tablet 1.5 by mouth qday  90 tablet  0  . DISCONTD: quinapril (ACCUPRIL) 40 MG tablet Take 1 tablet (40 mg total) by mouth 2 (two) times daily.  180 tablet  3  . aspirin 81 MG chewable tablet Chew 81 mg by mouth daily. ON HOLD      . metFORMIN (GLUCOPHAGE) 1000 MG tablet Take 1 tablet (1,000 mg total) by mouth 2 (two) times daily.  180 tablet  3  . DISCONTD: aspirin 81 MG chewable tablet Chew 1 tablet (81 mg total) by mouth daily.  90 tablet  3   Allergies as of 07/25/2011  . (No Known Allergies)    reports that he has never smoked. He has never used smokeless tobacco. He reports that he does not drink alcohol or use illicit drugs.     Review of Systems: Pertinent positive and negative review of systems were noted in the above HPI section. All other review of systems were otherwise negative.  Vital  signs were reviewed in today's medical record Physical Exam: General: Well developed , well nourished, no acute distress Head: Normocephalic and atraumatic Eyes:  sclerae anicteric, EOMI Ears: Normal auditory acuity Mouth: No deformity or lesions Neck: Supple, no masses or thyromegaly Lungs: Clear throughout to auscultation Heart: Regular rate and rhythm; no murmurs, rubs or bruits Abdomen: Soft, non tender and non distended. No masses, hepatosplenomegaly or hernias noted. Normal Bowel sounds; a large (4-5 cm)  irreducible umbilical hernia is present Rectal: There are no rectal masses. Stool is Hemoccult negative Musculoskeletal: Symmetrical with no gross deformities  Skin: No lesions on visible extremities Pulses:  Normal pulses noted Extremities: No clubbing, cyanosis, edema or deformities noted Neurological: Alert oriented x 4, grossly nonfocal Cervical Nodes:  No significant cervical  adenopathy Inguinal Nodes: No significant inguinal adenopathy Psychological:  Alert and cooperative. Normal mood and affect

## 2011-07-25 NOTE — Assessment & Plan Note (Signed)
Plan colonoscopy for evaluation of persistent rectal bleeding.

## 2011-07-25 NOTE — Assessment & Plan Note (Signed)
Since the patient is symptomatic I will send him back to [place] [facility] for a second opinion

## 2011-07-28 ENCOUNTER — Encounter: Payer: Self-pay | Admitting: Gastroenterology

## 2011-07-28 ENCOUNTER — Ambulatory Visit (AMBULATORY_SURGERY_CENTER): Payer: Self-pay | Admitting: Gastroenterology

## 2011-07-28 VITALS — BP 125/77 | HR 99 | Temp 96.3°F | Resp 26 | Ht 69.0 in | Wt 278.0 lb

## 2011-07-28 DIAGNOSIS — K5730 Diverticulosis of large intestine without perforation or abscess without bleeding: Secondary | ICD-10-CM

## 2011-07-28 DIAGNOSIS — K921 Melena: Secondary | ICD-10-CM

## 2011-07-28 DIAGNOSIS — D126 Benign neoplasm of colon, unspecified: Secondary | ICD-10-CM

## 2011-07-28 LAB — GLUCOSE, CAPILLARY: Glucose-Capillary: 148 mg/dL — ABNORMAL HIGH (ref 70–99)

## 2011-07-28 MED ORDER — SODIUM CHLORIDE 0.9 % IV SOLN: 500.0000 mL | INTRAVENOUS | Status: DC

## 2011-07-28 NOTE — Progress Notes (Signed)
During procedure pt began to wretch and vomited a small amt of emesis. Cold cloths applied to neck and forehead. crna at hob and md aware. Skin warm and dry. sats and blood pressur estable. No new orders given. ewm

## 2011-07-28 NOTE — Progress Notes (Signed)
Patient did not experience any of the following events: a burn prior to discharge; a fall within the facility; wrong site/side/patient/procedure/implant event; or a hospital transfer or hospital admission upon discharge from the facility. (G8907) Patient did not have preoperative order for IV antibiotic SSI prophylaxis. (G8918)  

## 2011-07-28 NOTE — Op Note (Signed)
[facility] [address] [place], [place]  [address]  COLONOSCOPY PROCEDURE REPORT  PATIENT:  Jacob [name], Jacob [name]  MR#:  [id number] BIRTHDATE:  20-Jun-1955, 55 yrs. old  GENDER:  male ENDOSCOPIST:  Barbette Hair. [name], MD REF. BY:  Melina Modena, M.D. PROCEDURE DATE:  07/28/2011 PROCEDURE:  Colonoscopy with snare polypectomy, Colonoscopy with submucosal injection ASA CLASS:  Class II INDICATIONS:  rectal bleeding MEDICATIONS:   MAC sedation, administered by CRNA propofol 300mg IV  DESCRIPTION OF PROCEDURE:   After the risks benefits and alternatives of the procedure were thoroughly explained, informed consent was obtained.  Digital rectal exam was performed and revealed no abnormalities.   The LB [id number] endoscope was introduced through the anus and advanced to the cecum, which was identified by both the appendix and ileocecal valve, without limitations.  The quality of the prep was good, using MoviPrep. The instrument was then slowly withdrawn as the colon was fully examined. <<PROCEDUREIMAGES>>  FINDINGS:  A pedunculated polyp was found in the sigmoid colon. It was 3.5 - 4 cm in size. It was found 28 cm from the point of entry. Bleeding, friable polyp Polyps were snared, then cauterized with monopolar cautery. Retrieval was successful (see image5 and image6). snare polyp  A sessile polyp was found in the descending colon. It was 4 mm in size. Polyp was snared without cautery. Retrieval was successful (see image4). snare polyp  Moderate diverticulosis was found in the sigmoid to descending colon segments (see image1).  This was otherwise a normal examination of the colon (see image3 and image8).   Retroflexed views in the rectum revealed no abnormalities.    The time to cecum =  1) 2.50 minutes. The scope was then withdrawn in  1) 25.75  minutes from the cecum and the procedure completed. COMPLICATIONS:  None ENDOSCOPIC IMPRESSION: 1) 3.5 - 4 cm pedunculated  polyp in the sigmoid colon 2) 4 mm sessile polyp in the descending colon 3) Moderate diverticulosis in the sigmoid to descending colon segments 4) Otherwise normal examination RECOMMENDATIONS: 1) Await biopsy results REPEAT EXAM:  In 1 year(s) for Colonoscopy.  ______________________________ Barbette Hair. [name], MD  CC:  n. eSIGNED:   Barbette Hair. Nicky Kras at 07/28/2011 03:01 PM  [place], [phone]

## 2011-07-28 NOTE — Progress Notes (Signed)
Propofol per s camp crna. See scanned intra procedure report. ewm 

## 2011-07-28 NOTE — Patient Instructions (Signed)
YOU HAD AN ENDOSCOPIC PROCEDURE TODAY AT THE [facility]: Refer to the procedure report that was given to you for any specific questions about what was found during the examination.  If the procedure report does not answer your questions, please call your gastroenterologist to clarify.  If you requested that your care partner not be given the details of your procedure findings, then the procedure report has been included in a sealed envelope for you to review at your convenience later.  YOU SHOULD EXPECT: Some feelings of bloating in the abdomen. Passage of more gas than usual.  Walking can help get rid of the air that was put into your GI tract during the procedure and reduce the bloating. If you had a lower endoscopy (such as a colonoscopy or flexible sigmoidoscopy) you may notice spotting of blood in your stool or on the toilet paper. If you underwent a bowel prep for your procedure, then you may not have a normal bowel movement for a few days.  DIET: Your first meal following the procedure should be a light meal and then it is ok to progress to your normal diet.  A half-sandwich or bowl of soup is an example of a good first meal.  Heavy or fried foods are harder to digest and may make you feel nauseous or bloated.  Likewise meals heavy in dairy and vegetables can cause extra gas to form and this can also increase the bloating.  Drink plenty of fluids but you should avoid alcoholic beverages for 24 hours.  ACTIVITY: Your care partner should take you home directly after the procedure.  You should plan to take it easy, moving slowly for the rest of the day.  You can resume normal activity the day after the procedure however you should NOT DRIVE or use heavy machinery for 24 hours (because of the sedation medicines used during the test).    SYMPTOMS TO REPORT IMMEDIATELY: A gastroenterologist can be reached at any hour.  During normal business hours, 8:30 AM to 5:00 PM Monday through Friday,  call [phone].  After hours and on weekends, please call the GI answering service at [phone] who will take a message and have the physician on call contact you.   Following lower endoscopy (colonoscopy or flexible sigmoidoscopy):  Excessive amounts of blood in the stool  Significant tenderness or worsening of abdominal pains  Swelling of the abdomen that is new, acute  Fever of 100F or higher  Following upper endoscopy (EGD)  Vomiting of blood or coffee ground material  New chest pain or pain under the shoulder blades  Painful or persistently difficult swallowing  New shortness of breath  Fever of 100F or higher  Black, tarry-looking stools  FOLLOW UP: If any biopsies were taken you will be contacted by phone or by letter within the next 1-3 weeks.  Call your gastroenterologist if you have not heard about the biopsies in 3 weeks.  Our staff will call the home number listed on your records the next business day following your procedure to check on you and address any questions or concerns that you may have at that time regarding the information given to you following your procedure. This is a courtesy call and so if there is no answer at the home number and we have not heard from you through the emergency physician on call, we will assume that you have returned to your regular daily activities without incident.  SIGNATURES/CONFIDENTIALITY: You and/or your care   partner have signed paperwork which will be entered into your electronic medical record.  These signatures attest to the fact that that the information above on your After Visit Summary has been reviewed and is understood.  Full responsibility of the confidentiality of this discharge information lies with you and/or your care-partner.  

## 2011-08-01 ENCOUNTER — Telehealth: Payer: Self-pay | Admitting: *Deleted

## 2011-08-01 NOTE — Telephone Encounter (Signed)
  Follow up Call-  Call back number 07/28/2011  Post procedure Call Back phone  # [phone]  Permission to leave phone message Yes     Left message with pts step mother for pt to call back with questions or concerns.  She states that he did fine over the weekend.

## 2011-08-04 ENCOUNTER — Encounter (INDEPENDENT_AMBULATORY_CARE_PROVIDER_SITE_OTHER): Payer: Self-pay | Admitting: General Surgery

## 2011-08-04 ENCOUNTER — Encounter: Payer: Self-pay | Admitting: Family Medicine

## 2011-08-04 ENCOUNTER — Ambulatory Visit (INDEPENDENT_AMBULATORY_CARE_PROVIDER_SITE_OTHER): Payer: PRIVATE HEALTH INSURANCE | Admitting: General Surgery

## 2011-08-04 ENCOUNTER — Other Ambulatory Visit: Payer: Self-pay | Admitting: Family Medicine

## 2011-08-04 VITALS — BP 148/93 | HR 88 | Temp 97.4°F | Ht 69.0 in | Wt 280.8 lb

## 2011-08-04 DIAGNOSIS — K635 Polyp of colon: Secondary | ICD-10-CM

## 2011-08-04 DIAGNOSIS — K420 Umbilical hernia with obstruction, without gangrene: Secondary | ICD-10-CM

## 2011-08-04 HISTORY — DX: Polyp of colon: K63.5

## 2011-08-04 MED ORDER — ATORVASTATIN CALCIUM 20 MG PO TABS: 20.0000 mg | ORAL_TABLET | Freq: Every day | ORAL | Status: DC

## 2011-08-04 NOTE — Patient Instructions (Signed)
You have a very large, nonreducible, incarcerated umbilical hernia. This will require an operation which will completely remove  your umbilicus. Mesh will be used to repair the hernia.  Because of your medical problems, you are at increased risk for recurrence of the hernia and other complications. Nevertheless, the surgery should be done since this will be a progressive problem otherwise.  We will schedule this surgery in the near future.   Hernia, Surgical Repair A hernia occurs when an internal organ pushes out through a weak spot in the belly (abdominal) wall muscles. Hernias commonly occur in the groin and around the navel. Hernias often can be pushed back into place (reduced). Most hernias tend to get worse over time. Problems occur when abdominal contents get stuck in the opening (incarcerated hernia). The blood supply gets cut off (strangulated hernia). This is an emergency and needs surgery. Otherwise, hernia repair can be an elective procedure. This means you can schedule this at your convenience when an emergency is not present. Because complications can occur, if you decide to repair the hernia, it is best to do it soon. When it becomes an emergency procedure, there is increased risk of complications after surgery. CAUSES   Heavy lifting.   Obesity.   Prolonged coughing.   Straining to move your bowels.   Hernias can also occur through a cut (incision) by a surgeonafter an abdominal operation.  HOME CARE INSTRUCTIONS Before the repair:  Bed rest is not required. You may continue your normal activities, but avoid heavy lifting (more than 10 pounds) or straining. Cough gently. If you are a smoker, it is best to stop. Even the best hernia repair can break down with the continual strain of coughing.   Do not wear anything tight over your hernia. Do not try to keep it in with an outside bandage or truss. These can damage abdominal contents if they are trapped in the hernia sac.    Eat a normal diet. Avoid constipation. Straining over long periods of time to have a bowel movement will increase hernia size. It also can breakdown repairs. If you cannot do this with diet alone, laxatives or stool softeners may be used.  PRIOR TO SURGERY, SEEK IMMEDIATE MEDICAL CARE IF: You have problems (symptoms) of a trapped (incarcerated) hernia. Symptoms include:  An oral temperature above 102 F (38.9 C) develops, or as your caregiver suggests.   Increasing abdominal pain.   Feeling sick to your stomach(nausea) and vomiting.   You stop passing gas or stool.   The hernia is stuck outside the abdomen, looks discolored, feels hard, or is tender.   You have any changes in your bowel habits or in the hernia that is unusual for you.  LET YOUR CAREGIVERS KNOW ABOUT THE FOLLOWING:  Allergies.   Medications taken including herbs, eye drops, over the counter medications, and creams.   Use of steroids (by mouth or creams).   Family or personal history of problems with anesthetics or Novocaine.   Possibility of pregnancy, if this applies.   Personal history of blood clots (thrombophlebitis).   Family or personal history of bleeding or blood problems.   Previous surgery.   Other health problems.  BEFORE THE PROCEDURE You should be present 1 hour prior to your procedure, or as directed by your caregiver.  AFTER THE PROCEDURE After surgery, you will be taken to the recovery area. A nurse will watch and check your progress there. Once you are awake, stable, and taking fluids well,  you will be allowed to go home as long as there are no problems. Once home, an ice pack (wrapped in a light towel) applied to your operative site may help with discomfort. It may also keep the swelling down. Do not lift anything heavier than 10 pounds (4.55 kilograms). Take showers not baths. Do not drive while taking narcotics. Follow instructions as suggested by your caregiver.  SEEK IMMEDIATE MEDICAL  CARE IF: After surgery:  There is redness, swelling, or increasing pain in the wound.   There is pus coming from the wound.   There is drainage from a wound lasting longer than 1 day.   An unexplained oral temperature above 102 F (38.9 C) develops.   You notice a foul smell coming from the wound or dressing.   There is a breaking open of a wound (edged not staying together) after the sutures have been removed.   You notice increasing pain in the shoulders (shoulder strap areas).   You develop dizzy episodes or fainting while standing.   You develop persistent nausea or vomiting.   You develop a rash.   You have difficulty breathing.   You develop any reaction or side effects to medications given.  MAKE SURE YOU:   Understand these instructions.   Will watch your condition.   Will get help right away if you are not doing well or get worse.  Document Released: 10/12/2000 Document Revised: 04/07/2011 Document Reviewed: 09/04/2007 [facility] Patient Information 2012 [place], [place].

## 2011-08-04 NOTE — Progress Notes (Signed)
Patient ID: Jacob [name], male   DOB: [birth year], 56 y.o.   MRN: [id number]  Chief Complaint  Patient presents with  . Pre-op Exam    eval hernia    HPI Jacob [name] is a 56 y.o. male.  [name] referred by Jacob [name] because of a large incarcerated umbilical hernia. His primary care physician is Jacob [name].  The patient has had an umbilical hernia for 4 years. It has progressively enlarged. It is painful. He says he cannot push it back in now. He has never had a hernia problem before. He has never had any abdominal or inguinal surgery in the past.  He has numerous comorbidities including BMI of 43, diabetes, hyperlipidemia, gout, depression, and hypertension. These problems are being managed by his primary care physician, although her notes suggest that he is not completely compliant.  He recently saw Jacob [name] for some minor rectal bleeding and the hernia was noticed and he was referred back. HPI  Past Medical History  Diagnosis Date  . Umbilical hernia   . Depression   . Diabetes mellitus   . Hypertension   . Obesity     History reviewed. No pertinent past surgical history.  Family History  Problem Relation Age of Onset  . Colon cancer Neg Hx     Social History History  Substance Use Topics  . Smoking status: Never Smoker   . Smokeless tobacco: Never Used  . Alcohol Use: No    No Known Allergies  Current Outpatient Prescriptions  Medication Sig Dispense Refill  . albuterol (PROVENTIL,VENTOLIN) 90 MCG/ACT inhaler Inhale 1-2 puffs into the lungs every 4 (four) hours as needed for wheezing. 1-2 puffs inhaled every 4-6 hours as needed  17 g  3  . allopurinol (ZYLOPRIM) 300 MG tablet Take 300 mg by mouth as needed.      Marland Kitchen amLODipine (NORVASC) 10 MG tablet Take 1 tablet (10 mg total) by mouth daily.  90 tablet  3  . aspirin 81 MG chewable tablet Chew 81 mg by mouth daily. ON HOLD      . atorvastatin (LIPITOR) 20 MG tablet Take 1 tablet (20 mg total) by mouth  at bedtime.  90 tablet  3  . desloratadine (CLARINEX) 5 MG tablet Take 5 mg by mouth daily.        . fluticasone (FLONASE) 50 MCG/ACT nasal spray 2 sprays by Nasal route daily. Dispense 2 month supply  16 g  3  . glipiZIDE (GLUCOTROL) 10 MG tablet Take 1 tablet (10 mg total) by mouth 2 (two) times daily.  180 tablet  3  . glucose blood (ACCU-CHEK AVIVA) test strip for checking sugars two times a day; dispense 2 month's supply       . ibuprofen (ADVIL,MOTRIN) 800 MG tablet Take 800 mg by mouth every 6 (six) hours as needed. For pain       . metFORMIN (GLUCOPHAGE) 1000 MG tablet Take 1 tablet (1,000 mg total) by mouth 2 (two) times daily.  180 tablet  3  . metoprolol succinate (TOPROL-XL) 100 MG 24 hr tablet Take 100 mg by mouth daily.      . quinapril (ACCUPRIL) 40 MG tablet Take 40 mg by mouth daily.      . nitroGLYCERIN (NITROSTAT) 0.4 MG SL tablet Place 1 tablet (0.4 mg total) under the tongue every 5 (five) minutes as needed for chest pain.  100 tablet  3    Review of Systems Review of Systems  Constitutional: Negative for fever, chills and unexpected weight change.  HENT: Negative for hearing loss, congestion, sore throat, trouble swallowing and voice change.   Eyes: Negative for visual disturbance.  Respiratory: Positive for shortness of breath. Negative for cough and wheezing.   Cardiovascular: Negative for chest pain, palpitations and leg swelling.  Gastrointestinal: Positive for abdominal pain and anal bleeding. Negative for nausea, vomiting, diarrhea, constipation, blood in stool, abdominal distention and rectal pain.  Genitourinary: Negative for hematuria and difficulty urinating.  Musculoskeletal: Negative for arthralgias.  Skin: Negative for rash and wound.  Neurological: Negative for seizures, syncope, weakness and headaches.  Hematological: Negative for adenopathy. Does not bruise/bleed easily.  Psychiatric/Behavioral: Negative for confusion.    Blood pressure 148/93, pulse  88, temperature 97.4 F (36.3 C), temperature source Temporal, height 5\' 9"  (1.753 m), weight 280 lb 12.8 oz (127.37 kg), SpO2 94.00%.  Physical Exam Physical Exam  Constitutional: [name] oriented to person, place, and time. He appears well-developed and well-nourished. No distress.       obese  HENT:  Head: Normocephalic.  Nose: Nose normal.  Mouth/Throat: No oropharyngeal exudate.  Eyes: Conjunctivae and EOM are normal. Pupils are equal, round, and reactive to light. Right eye exhibits no discharge. Left eye exhibits no discharge. No scleral icterus.  Neck: Normal range of motion. Neck supple. No JVD present. No tracheal deviation present. No thyromegaly present.  Cardiovascular: Normal rate, regular rhythm, normal heart sounds and intact distal pulses.   No murmur heard. Pulmonary/Chest: Effort normal and breath sounds normal. No stridor. No respiratory distress. He has no wheezes. He has no rales. He exhibits no tenderness.  Abdominal: Soft. Bowel sounds are normal. He exhibits mass. He exhibits no distension. There is no tenderness. There is no rebound and no guarding.       8 cm incarcerated umbilical hernia. No infection. No inflammation. Tender.  Genitourinary:       No inguinal mass or hernia  Musculoskeletal: Normal range of motion. He exhibits no edema and no tenderness.  Lymphadenopathy:    He has no cervical adenopathy.  Neurological: [name] alert and oriented to person, place, and time. He has normal reflexes. Coordination normal.  Skin: Skin is warm and dry. No rash noted. [name] not diaphoretic. No erythema. No pallor.  Psychiatric: He has a normal mood and affect. His behavior is normal. Judgment and thought content normal.    Data Reviewed I reviewed our old records, Jacob [name]  records, and Jacob [name] records.  Assessment    Very large, incarcerated umbilical hernia. Elective repair is indicated to lower the risk of future bowel obstruction or  strangulation.  Morbid obesity, BMI 43  Non-insulin-dependent diabetes mellitus  Hyperlipidemia  Gout  Depression  Hypertension  Suspected medical noncompliance    Plan    I had a long discussion with the patient. I told him that he was clearly at increased risk for surgical complications including infection and recurrence of the hernia. I advised him that it was in his best interest to undergo elective repair since the hernia will progress in the future. [name] in full agreement  We'll schedule for elective repair of his umbilical hernia with mesh.  The patient was advised that he will completely lose his umbilicus and will be left with a simple transverse incision and no umbilicus. He accepts that.  I discussed the indications, details, techniques, and risks of the surgery. Recurrence, loss of umbilicus, infection, injury to the intestine, cardiac,  pulmonary, and thromboembolic problems or mentioned as just a few of the complications that might occur. His questions were answered. He understands these issues. He agrees with this plan.       Angelia Mould. Derrell Lolling, M.D., [facility] General and Minimally invasive Surgery Breast and Colorectal Surgery Office:   [phone] Pager:   [phone]  08/04/2011, 10:22 AM

## 2011-08-05 ENCOUNTER — Encounter: Payer: Self-pay | Admitting: Gastroenterology

## 2011-08-09 ENCOUNTER — Encounter (HOSPITAL_COMMUNITY): Payer: Self-pay | Admitting: Pharmacy Technician

## 2011-08-10 ENCOUNTER — Encounter (HOSPITAL_COMMUNITY): Payer: Self-pay

## 2011-08-10 ENCOUNTER — Encounter (HOSPITAL_COMMUNITY)
Admission: RE | Admit: 2011-08-10 | Discharge: 2011-08-10 | Disposition: A | Discharge status: Home / Self Care | Payer: Self-pay | Source: Ambulatory Visit | Attending: General Surgery | Admitting: General Surgery

## 2011-08-10 ENCOUNTER — Other Ambulatory Visit: Payer: Self-pay | Admitting: Family Medicine

## 2011-08-10 ENCOUNTER — Ambulatory Visit (HOSPITAL_COMMUNITY)
Admission: RE | Admit: 2011-08-10 | Discharge: 2011-08-10 | Disposition: A | Discharge status: Home / Self Care | Payer: Self-pay | Source: Ambulatory Visit | Attending: General Surgery | Admitting: General Surgery

## 2011-08-10 DIAGNOSIS — K429 Umbilical hernia without obstruction or gangrene: Secondary | ICD-10-CM | POA: Insufficient documentation

## 2011-08-10 DIAGNOSIS — M1990 Unspecified osteoarthritis, unspecified site: Secondary | ICD-10-CM

## 2011-08-10 DIAGNOSIS — N200 Calculus of kidney: Secondary | ICD-10-CM

## 2011-08-10 DIAGNOSIS — Z01810 Encounter for preprocedural cardiovascular examination: Secondary | ICD-10-CM | POA: Insufficient documentation

## 2011-08-10 DIAGNOSIS — R053 Chronic cough: Secondary | ICD-10-CM

## 2011-08-10 DIAGNOSIS — G709 Myoneural disorder, unspecified: Secondary | ICD-10-CM

## 2011-08-10 DIAGNOSIS — R0602 Shortness of breath: Secondary | ICD-10-CM

## 2011-08-10 DIAGNOSIS — Z01812 Encounter for preprocedural laboratory examination: Secondary | ICD-10-CM | POA: Insufficient documentation

## 2011-08-10 DIAGNOSIS — I10 Essential (primary) hypertension: Secondary | ICD-10-CM

## 2011-08-10 DIAGNOSIS — F32A Depression, unspecified: Secondary | ICD-10-CM

## 2011-08-10 HISTORY — DX: Shortness of breath: R06.02

## 2011-08-10 HISTORY — DX: Myoneural disorder, unspecified: G70.9

## 2011-08-10 HISTORY — DX: Chronic cough: R05.3

## 2011-08-10 HISTORY — DX: Unspecified osteoarthritis, unspecified site: M19.90

## 2011-08-10 HISTORY — DX: Cough: R05

## 2011-08-10 HISTORY — DX: Calculus of kidney: N20.0

## 2011-08-10 HISTORY — DX: Depression, unspecified: F32.A

## 2011-08-10 HISTORY — DX: Essential (primary) hypertension: I10

## 2011-08-10 LAB — CBC
MCH: 27.3 pg (ref 26.0–34.0)
MCV: 82.7 fL (ref 78.0–100.0)
Platelets: 162 10*3/uL (ref 150–400)
RBC: 5.39 MIL/uL (ref 4.22–5.81)
RDW: 15.3 % (ref 11.5–15.5)

## 2011-08-10 LAB — URINALYSIS, ROUTINE W REFLEX MICROSCOPIC
Bilirubin Urine: NEGATIVE
Glucose, UA: NEGATIVE mg/dL
Hgb urine dipstick: NEGATIVE
Ketones, ur: NEGATIVE mg/dL
Nitrite: NEGATIVE
Specific Gravity, Urine: 1.018 (ref 1.005–1.030)
pH: 6 (ref 5.0–8.0)

## 2011-08-10 LAB — COMPREHENSIVE METABOLIC PANEL
ALT: 18 U/L (ref 0–53)
AST: 17 U/L (ref 0–37)
Albumin: 3.9 g/dL (ref 3.5–5.2)
CO2: 32 mEq/L (ref 19–32)
Calcium: 9.6 mg/dL (ref 8.4–10.5)
Creatinine, Ser: 0.96 mg/dL (ref 0.50–1.35)
GFR calc non Af Amer: >90 mL/min (ref >90)
Sodium: 138 mEq/L (ref 135–145)
Total Protein: 7.9 g/dL (ref 6.0–8.3)

## 2011-08-10 LAB — DIFFERENTIAL
Basophils Absolute: 0 10*3/uL (ref 0.0–0.1)
Eosinophils Relative: 2 % (ref 0–5)
Lymphocytes Relative: 22 % (ref 12–46)
Lymphs Abs: 2.4 10*3/uL (ref 0.7–4.0)
Monocytes Absolute: 0.7 10*3/uL (ref 0.1–1.0)
Monocytes Relative: 7 % (ref 3–12)
Neutro Abs: 7.8 10*3/uL — ABNORMAL HIGH (ref 1.7–7.7)

## 2011-08-10 MED ORDER — QUINAPRIL HCL 40 MG PO TABS: 40.0000 mg | ORAL_TABLET | Freq: Every day | ORAL | Status: DC

## 2011-08-10 MED ORDER — CHLORHEXIDINE GLUCONATE 4 % EX LIQD
1.0000 "application " | Freq: Once | CUTANEOUS | Status: DC
  Filled 2011-08-10: qty 15

## 2011-08-10 NOTE — Progress Notes (Signed)
08/10/11 1510  OBSTRUCTIVE SLEEP APNEA  Do you snore loudly (loud enough to be heard through closed doors)?  1  Do you often feel tired, fatigued, or sleepy during the daytime? 1  Has anyone observed you stop breathing during your sleep? 0  Do you have, or are you being treated for high blood pressure? 1  BMI more than 35 kg/m2? 1 (bmi 42.7)  Age over 55 years old? 1  Neck circumference greater than 40 cm/18 inches? 1 (neck 20 inches)  Gender: 1  Obstructive Sleep Apnea Score 7   Score 4 or greater  Results sent to PCP;Updated health history

## 2011-08-10 NOTE — Pre-Procedure Instructions (Addendum)
08-10-11 EKG/ CXR done today.Your Pt has screened with an elevated risk for obstructive sleep apnea using the Stop-Bang tool during a presurgical  Visit. A score of four or greater is an elevated risk. Pt. Score was 7 with PST visit. Modesto Charon 08-11-11 0845 Voice message left, reminding pt to get RX fill and use Mupirocin oint. As directed. And to be aware would be necessary to be on Contact Isolation while here.W. Demetris Capell,RN 08-12-11 Pt. Acknowledge received message.W. Kennon Portela

## 2011-08-10 NOTE — Patient Instructions (Addendum)
21 Jacob [name]  08/10/2011   Your procedure is scheduled on:  4-22 -2013  Report to [facility] at   0700     AM.  Call this number if you have problems the morning of surgery: [phone]   Remember:   Do not eat food:After Midnight.    Take these medicines the morning of surgery with A SIP OF WATER: Amlodipine(Norvasc),Clarinex, Flonase as needed, Metoprolol(Toprol) Am Of. Take Quinapril night before as usual. Donot take any diabetic meds  Am of. Bring Ventolin Inhaler to hospital.   Do not wear jewelry, make-up or nail polish.  Do not wear lotions, powders, or perfumes. You may wear deodorant.  Do not shave 48 hours prior to surgery.(face and neck okay, no shaving of legs)  Do not bring valuables to the hospital.  Contacts, dentures or bridgework may not be worn into surgery.  Leave suitcase in the car. After surgery it may be brought to your room.  For patients admitted to the hospital, checkout time is 11:00 AM the day of discharge.   Patients discharged the day of surgery will not be allowed to drive home.  Name and phone number of your driver: family  Special Instructions: CHG Shower Use Special Wash: 1/2 bottle night before surgery and 1/2 bottle morning of surgery.(avoid face and genitals)   Please read over the following fact sheets that you were given: MRSA Information.

## 2011-08-11 ENCOUNTER — Other Ambulatory Visit (INDEPENDENT_AMBULATORY_CARE_PROVIDER_SITE_OTHER): Payer: Self-pay | Admitting: General Surgery

## 2011-08-17 NOTE — H&P (Signed)
Jacob [name]    MRN: [id number]   Description: 56 year old male  Provider: Ernestene Mention, MD  Department: [facility]     Diagnoses     Incarcerated umbilical hernia   - Primary    552.1      Vitals     BP Pulse Temp(Src) Ht Wt BMI    148/93  88  97.4 F (36.3 C) (Temporal)  5\' 9"  (1.753 m)  280 lb 12.8 oz (127.37 kg)  41.47 kg/m2       History and Physical    Ernestene Mention, MD   Patient ID: Jacob [name], male   DOB: November 08, 1955, 56 y.o.   MRN: [id number]        HPI Jacob [name] is a 56 y.o. male.  [name] referred by Dr. Melvia Heaps because of a large incarcerated umbilical hernia. His primary care physician is [name].  The patient has had an umbilical hernia for 4 years. It has progressively enlarged. It is painful. He says he cannot push it back in now. He has never had a hernia problem before. He has never had any abdominal or inguinal surgery in the past.  He has numerous comorbidities including BMI of 43, diabetes, hyperlipidemia, gout, depression, and hypertension. These problems are being managed by his primary care physician, although her notes suggest that he is not completely compliant.  He recently saw Dr. [name] for some minor rectal bleeding and the hernia was noticed and he was referred back.     Past Medical History   Diagnosis  Date   .  Umbilical hernia     .  Depression     .  Diabetes mellitus     .  Hypertension     .  Obesity       History reviewed. No pertinent past surgical history.    Family History   Problem  Relation  Age of Onset   .  Colon cancer  Neg Hx       Social History History   Substance Use Topics   .  Smoking status:  Never Smoker    .  Smokeless tobacco:  Never Used   .  Alcohol Use:  No      No Known Allergies    Current Outpatient Prescriptions   Medication  Sig  Dispense  Refill   .  albuterol (PROVENTIL,VENTOLIN) 90 MCG/ACT inhaler  Inhale 1-2 puffs into the lungs every 4 (four)  hours as needed for wheezing. 1-2 puffs inhaled every 4-6 hours as needed   17 g   3   .  allopurinol (ZYLOPRIM) 300 MG tablet  Take 300 mg by mouth as needed.         Marland Kitchen  amLODipine (NORVASC) 10 MG tablet  Take 1 tablet (10 mg total) by mouth daily.   90 tablet   3   .  aspirin 81 MG chewable tablet  Chew 81 mg by mouth daily. ON HOLD         .  atorvastatin (LIPITOR) 20 MG tablet  Take 1 tablet (20 mg total) by mouth at bedtime.   90 tablet   3   .  desloratadine (CLARINEX) 5 MG tablet  Take 5 mg by mouth daily.           .  fluticasone (FLONASE) 50 MCG/ACT nasal spray  2 sprays by Nasal route daily. Dispense 2 month supply   16 g  3   .  glipiZIDE (GLUCOTROL) 10 MG tablet  Take 1 tablet (10 mg total) by mouth 2 (two) times daily.   180 tablet   3   .  glucose blood (ACCU-CHEK AVIVA) test strip  for checking sugars two times a day; dispense 2 month's supply          .  ibuprofen (ADVIL,MOTRIN) 800 MG tablet  Take 800 mg by mouth every 6 (six) hours as needed. For pain          .  metFORMIN (GLUCOPHAGE) 1000 MG tablet  Take 1 tablet (1,000 mg total) by mouth 2 (two) times daily.   180 tablet   3   .  metoprolol succinate (TOPROL-XL) 100 MG 24 hr tablet  Take 100 mg by mouth daily.         .  quinapril (ACCUPRIL) 40 MG tablet  Take 40 mg by mouth daily.         .  nitroGLYCERIN (NITROSTAT) 0.4 MG SL tablet  Place 1 tablet (0.4 mg total) under the tongue every 5 (five) minutes as needed for chest pain.   100 tablet   3      Review of Systems   Constitutional: Negative for fever, chills and unexpected weight change.  HENT: Negative for hearing loss, congestion, sore throat, trouble swallowing and voice change.   Eyes: Negative for visual disturbance.  Respiratory: Positive for shortness of breath. Negative for cough and wheezing.   Cardiovascular: Negative for chest pain, palpitations and leg swelling.  Gastrointestinal: Positive for abdominal pain and anal bleeding. Negative for nausea,  vomiting, diarrhea, constipation, blood in stool, abdominal distention and rectal pain.  Genitourinary: Negative for hematuria and difficulty urinating.  Musculoskeletal: Negative for arthralgias.  Skin: Negative for rash and wound.  Neurological: Negative for seizures, syncope, weakness and headaches.  Hematological: Negative for adenopathy. Does not bruise/bleed easily.  Psychiatric/Behavioral: Negative for confusion.    Blood pressure 148/93, pulse 88, temperature 97.4 F (36.3 C), temperature source Temporal, height 5\' 9"  (1.753 m), weight 280 lb 12.8 oz (127.37 kg), SpO2 94.00%.   Physical Exam  Constitutional: [name] oriented to person, place, and time. He appears well-developed and well-nourished. No distress.       obese  HENT:   Head: Normocephalic.   Nose: Nose normal.   Mouth/Throat: No oropharyngeal exudate.  Eyes: Conjunctivae and EOM are normal. Pupils are equal, round, and reactive to light. Right eye exhibits no discharge. Left eye exhibits no discharge. No scleral icterus.  Neck: Normal range of motion. Neck supple. No JVD present. No tracheal deviation present. No thyromegaly present.  Cardiovascular: Normal rate, regular rhythm, normal heart sounds and intact distal pulses.    No murmur heard. Pulmonary/Chest: Effort normal and breath sounds normal. No stridor. No respiratory distress. He has no wheezes. He has no rales. He exhibits no tenderness.  Abdominal: Soft. Bowel sounds are normal. He exhibits mass. He exhibits no distension. There is no tenderness. There is no rebound and no guarding.       8 cm incarcerated umbilical hernia. No infection. No inflammation. Tender.  Genitourinary:       No inguinal mass or hernia  Musculoskeletal: Normal range of motion. He exhibits no edema and no tenderness.  Lymphadenopathy:    He has no cervical adenopathy.  Neurological: [name] alert and oriented to person, place, and time. He has normal reflexes. Coordination normal.    Skin: Skin is warm and dry. No  rash noted. [name] not diaphoretic. No erythema. No pallor.  Psychiatric: He has a normal mood and affect. His behavior is normal. Judgment and thought content normal.    Data Reviewed I reviewed our old records, Dr. Marzetta Board  records, and Dr. Stephannie Li records.   Assessment Very large, incarcerated umbilical hernia. Elective repair is indicated to lower the risk of future bowel obstruction or strangulation.  Morbid obesity, BMI 43  Non-insulin-dependent diabetes mellitus  Hyperlipidemia  Gout  Depression  Hypertension  Suspected medical noncompliance   Plan I had a long discussion with the patient. I told him that he was clearly at increased risk for surgical complications including infection and recurrence of the hernia. I advised him that it was in his best interest to undergo elective repair since the hernia will progress in the future. [name] in full agreement  We'll schedule for elective repair of his umbilical hernia with mesh.  The patient was advised that he will completely lose his umbilicus and will be left with a simple transverse incision and no umbilicus. He accepts that.  I discussed the indications, details, techniques, and risks of the surgery. Recurrence, loss of umbilicus, infection, injury to the intestine, cardiac, pulmonary, and thromboembolic problems or mentioned as just a few of the complications that might occur. His questions were answered. He understands these issues. He agrees with this plan.       Angelia Mould. Derrell Lolling, M.D., [facility] General and Minimally invasive Surgery Breast and Colorectal Surgery Office:   [phone] Pager:   [phone]

## 2011-08-22 ENCOUNTER — Encounter (HOSPITAL_COMMUNITY): Payer: Self-pay | Admitting: Anesthesiology

## 2011-08-22 ENCOUNTER — Encounter (HOSPITAL_COMMUNITY)
Admission: RE | Disposition: A | Discharge status: Home / Self Care | Payer: Self-pay | Source: Ambulatory Visit | Attending: General Surgery

## 2011-08-22 ENCOUNTER — Encounter (HOSPITAL_COMMUNITY): Payer: Self-pay | Admitting: *Deleted

## 2011-08-22 ENCOUNTER — Inpatient Hospital Stay (HOSPITAL_COMMUNITY)
Admission: RE | Admit: 2011-08-22 | Discharge: 2011-08-26 | DRG: 354 | Disposition: A | Discharge status: Home / Self Care | Payer: Self-pay | Source: Ambulatory Visit | Attending: General Surgery | Admitting: General Surgery

## 2011-08-22 ENCOUNTER — Ambulatory Visit (HOSPITAL_COMMUNITY): Payer: Self-pay | Admitting: Anesthesiology

## 2011-08-22 DIAGNOSIS — J9819 Other pulmonary collapse: Secondary | ICD-10-CM | POA: Diagnosis not present

## 2011-08-22 DIAGNOSIS — R0683 Snoring: Secondary | ICD-10-CM

## 2011-08-22 DIAGNOSIS — K560 Paralytic ileus: Secondary | ICD-10-CM | POA: Diagnosis not present

## 2011-08-22 DIAGNOSIS — Z6841 Body Mass Index (BMI) 40.0 and over, adult: Secondary | ICD-10-CM

## 2011-08-22 DIAGNOSIS — J9811 Atelectasis: Secondary | ICD-10-CM

## 2011-08-22 DIAGNOSIS — R0689 Other abnormalities of breathing: Secondary | ICD-10-CM

## 2011-08-22 DIAGNOSIS — E669 Obesity, unspecified: Secondary | ICD-10-CM

## 2011-08-22 DIAGNOSIS — K668 Other specified disorders of peritoneum: Secondary | ICD-10-CM | POA: Diagnosis present

## 2011-08-22 DIAGNOSIS — K436 Other and unspecified ventral hernia with obstruction, without gangrene: Secondary | ICD-10-CM

## 2011-08-22 DIAGNOSIS — F329 Major depressive disorder, single episode, unspecified: Secondary | ICD-10-CM | POA: Diagnosis present

## 2011-08-22 DIAGNOSIS — Z79899 Other long term (current) drug therapy: Secondary | ICD-10-CM

## 2011-08-22 DIAGNOSIS — R0902 Hypoxemia: Secondary | ICD-10-CM | POA: Diagnosis not present

## 2011-08-22 DIAGNOSIS — E119 Type 2 diabetes mellitus without complications: Secondary | ICD-10-CM | POA: Diagnosis present

## 2011-08-22 DIAGNOSIS — J310 Chronic rhinitis: Secondary | ICD-10-CM | POA: Diagnosis present

## 2011-08-22 DIAGNOSIS — E785 Hyperlipidemia, unspecified: Secondary | ICD-10-CM | POA: Diagnosis present

## 2011-08-22 DIAGNOSIS — Z22322 Carrier or suspected carrier of Methicillin resistant Staphylococcus aureus: Secondary | ICD-10-CM

## 2011-08-22 DIAGNOSIS — J986 Disorders of diaphragm: Secondary | ICD-10-CM | POA: Diagnosis present

## 2011-08-22 DIAGNOSIS — I10 Essential (primary) hypertension: Secondary | ICD-10-CM | POA: Diagnosis present

## 2011-08-22 DIAGNOSIS — F3289 Other specified depressive episodes: Secondary | ICD-10-CM | POA: Diagnosis present

## 2011-08-22 DIAGNOSIS — K429 Umbilical hernia without obstruction or gangrene: Secondary | ICD-10-CM

## 2011-08-22 DIAGNOSIS — G4733 Obstructive sleep apnea (adult) (pediatric): Secondary | ICD-10-CM | POA: Diagnosis present

## 2011-08-22 DIAGNOSIS — M109 Gout, unspecified: Secondary | ICD-10-CM | POA: Diagnosis present

## 2011-08-22 DIAGNOSIS — F411 Generalized anxiety disorder: Secondary | ICD-10-CM | POA: Diagnosis present

## 2011-08-22 DIAGNOSIS — J9620 Acute and chronic respiratory failure, unspecified whether with hypoxia or hypercapnia: Secondary | ICD-10-CM | POA: Diagnosis present

## 2011-08-22 HISTORY — PX: VENTRAL HERNIA REPAIR: SHX424

## 2011-08-22 LAB — GLUCOSE, CAPILLARY
Glucose-Capillary: 219 mg/dL — ABNORMAL HIGH (ref 70–99)
Glucose-Capillary: 211 mg/dL — ABNORMAL HIGH (ref 70–99)

## 2011-08-22 LAB — CBC
Hemoglobin: 13 g/dL (ref 13.0–17.0)
MCH: 27.1 pg (ref 26.0–34.0)
MCHC: 32.4 g/dL (ref 30.0–36.0)
Platelets: 173 10*3/uL (ref 150–400)
RDW: 15.1 % (ref 11.5–15.5)

## 2011-08-22 LAB — CREATININE, SERUM: Creatinine, Ser: 0.96 mg/dL (ref 0.50–1.35)

## 2011-08-22 SURGERY — REPAIR, HERNIA, VENTRAL
Anesthesia: General | Site: Abdomen | Wound class: Clean

## 2011-08-22 MED ORDER — INSULIN ASPART 100 UNIT/ML ~~LOC~~ SOLN
SUBCUTANEOUS | Status: AC
  Administered 2011-08-22: 5 [IU] via SUBCUTANEOUS
  Filled 2011-08-22: qty 1

## 2011-08-22 MED ORDER — PANTOPRAZOLE SODIUM 40 MG IV SOLR
40.0000 mg | Freq: Every day | INTRAVENOUS | Status: DC
  Administered 2011-08-22: 40 mg via INTRAVENOUS
  Filled 2011-08-22 (×2): qty 40

## 2011-08-22 MED ORDER — INSULIN ASPART 100 UNIT/ML ~~LOC~~ SOLN: 0.0000 [IU] | SUBCUTANEOUS | Status: DC

## 2011-08-22 MED ORDER — HYDROCODONE-ACETAMINOPHEN 5-325 MG PO TABS: 1.0000 | ORAL_TABLET | ORAL | Status: AC | PRN

## 2011-08-22 MED ORDER — QUINAPRIL HCL 10 MG PO TABS: 40.0000 mg | ORAL_TABLET | Freq: Every day | ORAL | Status: DC

## 2011-08-22 MED ORDER — POTASSIUM CHLORIDE IN NACL 20-0.9 MEQ/L-% IV SOLN
INTRAVENOUS | Status: DC
  Administered 2011-08-22: 14:00:00 via INTRAVENOUS
  Filled 2011-08-22 (×3): qty 1000

## 2011-08-22 MED ORDER — ACETAMINOPHEN 650 MG RE SUPP: 650.0000 mg | RECTAL | Status: DC | PRN

## 2011-08-22 MED ORDER — HYDROMORPHONE HCL PF 1 MG/ML IJ SOLN
INTRAMUSCULAR | Status: AC
  Filled 2011-08-22: qty 1

## 2011-08-22 MED ORDER — CHLORHEXIDINE GLUCONATE 4 % EX LIQD
1.0000 "application " | Freq: Once | CUTANEOUS | Status: DC
  Filled 2011-08-22: qty 15

## 2011-08-22 MED ORDER — PROMETHAZINE HCL 25 MG/ML IJ SOLN
INTRAMUSCULAR | Status: AC
  Filled 2011-08-22: qty 1

## 2011-08-22 MED ORDER — HEPARIN SODIUM (PORCINE) 5000 UNIT/ML IJ SOLN
5000.0000 [IU] | Freq: Once | INTRAMUSCULAR | Status: AC
  Administered 2011-08-22: 5000 [IU] via SUBCUTANEOUS

## 2011-08-22 MED ORDER — ACETAMINOPHEN 10 MG/ML IV SOLN
INTRAVENOUS | Status: AC
  Filled 2011-08-22: qty 100

## 2011-08-22 MED ORDER — SODIUM CHLORIDE 0.9 % IJ SOLN: 3.0000 mL | INTRAMUSCULAR | Status: DC | PRN

## 2011-08-22 MED ORDER — FLUTICASONE PROPIONATE 50 MCG/ACT NA SUSP
2.0000 | Freq: Every day | NASAL | Status: DC
  Administered 2011-08-22 – 2011-08-26 (×5): 2 via NASAL
  Filled 2011-08-22: qty 16

## 2011-08-22 MED ORDER — ONDANSETRON HCL 4 MG/2ML IJ SOLN: 4.0000 mg | Freq: Four times a day (QID) | INTRAMUSCULAR | Status: DC | PRN

## 2011-08-22 MED ORDER — GLIPIZIDE 10 MG PO TABS
10.0000 mg | ORAL_TABLET | Freq: Two times a day (BID) | ORAL | Status: DC
  Administered 2011-08-22 – 2011-08-26 (×8): 10 mg via ORAL
  Filled 2011-08-22 (×11): qty 1

## 2011-08-22 MED ORDER — BUPIVACAINE LIPOSOME 1.3 % IJ SUSP
INTRAMUSCULAR | Status: DC | PRN
  Administered 2011-08-22: 20 mL

## 2011-08-22 MED ORDER — ASPIRIN 81 MG PO CHEW
81.0000 mg | CHEWABLE_TABLET | Freq: Every day | ORAL | Status: DC
  Administered 2011-08-23 – 2011-08-26 (×4): 81 mg via ORAL
  Filled 2011-08-22 (×6): qty 1

## 2011-08-22 MED ORDER — DOXYCYCLINE HYCLATE 100 MG PO TABS: 100.0000 mg | ORAL_TABLET | Freq: Two times a day (BID) | ORAL | Status: AC

## 2011-08-22 MED ORDER — OXYCODONE HCL 5 MG PO TABS: 5.0000 mg | ORAL_TABLET | ORAL | Status: DC | PRN

## 2011-08-22 MED ORDER — ALBUTEROL 90 MCG/ACT IN AERS: 1.0000 | INHALATION_SPRAY | RESPIRATORY_TRACT | Status: DC | PRN

## 2011-08-22 MED ORDER — AMLODIPINE BESYLATE 10 MG PO TABS
10.0000 mg | ORAL_TABLET | Freq: Every day | ORAL | Status: DC
  Administered 2011-08-23 – 2011-08-26 (×4): 10 mg via ORAL
  Filled 2011-08-22 (×6): qty 1

## 2011-08-22 MED ORDER — BUPIVACAINE LIPOSOME 1.3 % IJ SUSP
20.0000 mL | Freq: Once | INTRAMUSCULAR | Status: DC
  Filled 2011-08-22: qty 20

## 2011-08-22 MED ORDER — LIDOCAINE HCL (CARDIAC) 20 MG/ML IV SOLN
INTRAVENOUS | Status: DC | PRN
  Administered 2011-08-22: 100 mg via INTRAVENOUS

## 2011-08-22 MED ORDER — FENTANYL CITRATE 0.05 MG/ML IJ SOLN
INTRAMUSCULAR | Status: DC | PRN
  Administered 2011-08-22 (×2): 50 ug via INTRAVENOUS
  Administered 2011-08-22: 100 ug via INTRAVENOUS

## 2011-08-22 MED ORDER — ACETAMINOPHEN 325 MG PO TABS: 650.0000 mg | ORAL_TABLET | ORAL | Status: DC | PRN

## 2011-08-22 MED ORDER — LISINOPRIL 40 MG PO TABS
40.0000 mg | ORAL_TABLET | Freq: Every day | ORAL | Status: DC
  Administered 2011-08-22 – 2011-08-25 (×4): 40 mg via ORAL
  Filled 2011-08-22 (×5): qty 1

## 2011-08-22 MED ORDER — LACTATED RINGERS IV SOLN
INTRAVENOUS | Status: DC | PRN
  Administered 2011-08-22 (×3): via INTRAVENOUS

## 2011-08-22 MED ORDER — ONDANSETRON HCL 4 MG/2ML IJ SOLN
4.0000 mg | Freq: Four times a day (QID) | INTRAMUSCULAR | Status: DC | PRN
  Administered 2011-08-24: 4 mg via INTRAVENOUS
  Filled 2011-08-22: qty 2

## 2011-08-22 MED ORDER — ACETAMINOPHEN 10 MG/ML IV SOLN
INTRAVENOUS | Status: DC | PRN
  Administered 2011-08-22: 1000 mg via INTRAVENOUS

## 2011-08-22 MED ORDER — NEOSTIGMINE METHYLSULFATE 1 MG/ML IJ SOLN
INTRAMUSCULAR | Status: DC | PRN
  Administered 2011-08-22: 4 mg via INTRAVENOUS

## 2011-08-22 MED ORDER — CEFAZOLIN SODIUM-DEXTROSE 2-3 GM-% IV SOLR: 2.0000 g | INTRAVENOUS | Status: DC

## 2011-08-22 MED ORDER — GLYCOPYRROLATE 0.2 MG/ML IJ SOLN
INTRAMUSCULAR | Status: DC | PRN
  Administered 2011-08-22: .6 mg via INTRAVENOUS

## 2011-08-22 MED ORDER — NITROGLYCERIN 0.4 MG SL SUBL: 0.4000 mg | SUBLINGUAL_TABLET | SUBLINGUAL | Status: DC | PRN

## 2011-08-22 MED ORDER — PROPOFOL 10 MG/ML IV EMUL
INTRAVENOUS | Status: DC | PRN
  Administered 2011-08-22: 150 mg via INTRAVENOUS

## 2011-08-22 MED ORDER — GLUCOSE BLOOD VI STRP: 1.0000 | ORAL_STRIP | Status: DC | PRN

## 2011-08-22 MED ORDER — MORPHINE SULFATE 4 MG/ML IJ SOLN
4.0000 mg | INTRAMUSCULAR | Status: DC | PRN
  Administered 2011-08-22 – 2011-08-23 (×2): 4 mg via INTRAVENOUS
  Filled 2011-08-22 (×2): qty 1

## 2011-08-22 MED ORDER — LORATADINE 10 MG PO TABS
10.0000 mg | ORAL_TABLET | Freq: Every day | ORAL | Status: DC
  Administered 2011-08-22 – 2011-08-26 (×5): 10 mg via ORAL
  Filled 2011-08-22 (×5): qty 1

## 2011-08-22 MED ORDER — LACTATED RINGERS IV SOLN
INTRAVENOUS | Status: DC
  Administered 2011-08-22: 1000 mL via INTRAVENOUS

## 2011-08-22 MED ORDER — HYDROMORPHONE HCL PF 1 MG/ML IJ SOLN: 2.0000 mg | INTRAMUSCULAR | Status: DC | PRN

## 2011-08-22 MED ORDER — 0.9 % SODIUM CHLORIDE (POUR BTL) OPTIME
TOPICAL | Status: DC | PRN
  Administered 2011-08-22: 1000 mL

## 2011-08-22 MED ORDER — COLCHICINE 0.6 MG PO TABS
0.6000 mg | ORAL_TABLET | Freq: Every day | ORAL | Status: DC
  Administered 2011-08-22 – 2011-08-26 (×5): 0.6 mg via ORAL
  Filled 2011-08-22 (×5): qty 1

## 2011-08-22 MED ORDER — VANCOMYCIN HCL 1000 MG IV SOLR
1500.0000 mg | Freq: Once | INTRAVENOUS | Status: DC
  Filled 2011-08-22: qty 1500

## 2011-08-22 MED ORDER — METOPROLOL SUCCINATE ER 100 MG PO TB24
100.0000 mg | ORAL_TABLET | Freq: Every day | ORAL | Status: DC
  Administered 2011-08-23 – 2011-08-26 (×4): 100 mg via ORAL
  Filled 2011-08-22 (×6): qty 1

## 2011-08-22 MED ORDER — HYDROMORPHONE HCL PF 1 MG/ML IJ SOLN
0.2500 mg | INTRAMUSCULAR | Status: DC | PRN
  Administered 2011-08-22 (×3): 0.25 mg via INTRAVENOUS

## 2011-08-22 MED ORDER — SODIUM CHLORIDE 0.9 % IJ SOLN
3.0000 mL | Freq: Two times a day (BID) | INTRAMUSCULAR | Status: DC
  Administered 2011-08-22 – 2011-08-26 (×8): 3 mL via INTRAVENOUS

## 2011-08-22 MED ORDER — SODIUM CHLORIDE 0.9 % IV SOLN: INTRAVENOUS | Status: DC

## 2011-08-22 MED ORDER — SIMVASTATIN 20 MG PO TABS
20.0000 mg | ORAL_TABLET | Freq: Every day | ORAL | Status: DC
  Administered 2011-08-22 – 2011-08-25 (×4): 20 mg via ORAL
  Filled 2011-08-22 (×5): qty 1

## 2011-08-22 MED ORDER — ENOXAPARIN SODIUM 40 MG/0.4ML ~~LOC~~ SOLN
40.0000 mg | SUBCUTANEOUS | Status: DC
  Administered 2011-08-23 – 2011-08-26 (×4): 40 mg via SUBCUTANEOUS
  Filled 2011-08-22 (×5): qty 0.4

## 2011-08-22 MED ORDER — HEPARIN SODIUM (PORCINE) 5000 UNIT/ML IJ SOLN
INTRAMUSCULAR | Status: AC
  Filled 2011-08-22: qty 1

## 2011-08-22 MED ORDER — VANCOMYCIN HCL 1000 MG IV SOLR
1500.0000 mg | INTRAVENOUS | Status: DC | PRN
  Administered 2011-08-22: 1500 mg via INTRAVENOUS

## 2011-08-22 MED ORDER — HEPARIN SODIUM (PORCINE) 5000 UNIT/ML IJ SOLN: 5000.0000 [IU] | Freq: Once | INTRAMUSCULAR | Status: DC

## 2011-08-22 MED ORDER — OXYCODONE HCL 5 MG PO TABS
5.0000 mg | ORAL_TABLET | ORAL | Status: DC | PRN
  Administered 2011-08-23 – 2011-08-26 (×6): 10 mg via ORAL
  Filled 2011-08-22 (×6): qty 2

## 2011-08-22 MED ORDER — DOCUSATE SODIUM 100 MG PO CAPS
100.0000 mg | ORAL_CAPSULE | Freq: Two times a day (BID) | ORAL | Status: DC
  Administered 2011-08-22 – 2011-08-26 (×8): 100 mg via ORAL
  Filled 2011-08-22 (×9): qty 1

## 2011-08-22 MED ORDER — SODIUM CHLORIDE 0.9 % IJ SOLN
INTRAMUSCULAR | Status: DC | PRN
  Administered 2011-08-22: 20 mL

## 2011-08-22 MED ORDER — ALBUTEROL SULFATE HFA 108 (90 BASE) MCG/ACT IN AERS: 1.0000 | INHALATION_SPRAY | RESPIRATORY_TRACT | Status: DC | PRN

## 2011-08-22 MED ORDER — CEFAZOLIN SODIUM-DEXTROSE 2-3 GM-% IV SOLR
2.0000 g | INTRAVENOUS | Status: AC
  Administered 2011-08-22: 2 g via INTRAVENOUS

## 2011-08-22 MED ORDER — ROCURONIUM BROMIDE 100 MG/10ML IV SOLN
INTRAVENOUS | Status: DC | PRN
  Administered 2011-08-22: 40 mg via INTRAVENOUS
  Administered 2011-08-22: 10 mg via INTRAVENOUS

## 2011-08-22 MED ORDER — SODIUM CHLORIDE 0.9 % IV SOLN: 250.0000 mL | INTRAVENOUS | Status: DC | PRN

## 2011-08-22 MED ORDER — MIDAZOLAM HCL 5 MG/5ML IJ SOLN
INTRAMUSCULAR | Status: DC | PRN
  Administered 2011-08-22: 2 mg via INTRAVENOUS

## 2011-08-22 MED ORDER — CEFAZOLIN SODIUM-DEXTROSE 2-3 GM-% IV SOLR
INTRAVENOUS | Status: AC
  Filled 2011-08-22: qty 50

## 2011-08-22 MED ORDER — ONDANSETRON HCL 4 MG/2ML IJ SOLN
INTRAMUSCULAR | Status: DC | PRN
  Administered 2011-08-22: 4 mg via INTRAVENOUS

## 2011-08-22 SURGICAL SUPPLY — 39 items
BINDER ABD UNIV 12 45-62 (WOUND CARE) IMPLANT
BINDER ABDOMINAL 46IN 62IN (WOUND CARE) ×2
BLADE HEX COATED 2.75 (ELECTRODE) ×2 IMPLANT
CANISTER SUCTION 2500CC (MISCELLANEOUS) ×2 IMPLANT
CLOTH BEACON ORANGE TIMEOUT ST (SAFETY) ×2 IMPLANT
DRAIN CHANNEL RND F F (WOUND CARE) IMPLANT
DRAPE LAPAROSCOPIC ABDOMINAL (DRAPES) ×2 IMPLANT
DRAPE POUCH INSTRU U-SHP 10X18 (DRAPES) ×2 IMPLANT
DRSG PAD ABDOMINAL 8X10 ST (GAUZE/BANDAGES/DRESSINGS) ×1 IMPLANT
ELECT REM PT RETURN 9FT ADLT (ELECTROSURGICAL) ×2
ELECTRODE REM PT RTRN 9FT ADLT (ELECTROSURGICAL) ×1 IMPLANT
EVACUATOR SILICONE 100CC (DRAIN) IMPLANT
GLOVE BIOGEL PI IND STRL 7.0 (GLOVE) ×1 IMPLANT
GLOVE BIOGEL PI INDICATOR 7.0 (GLOVE) ×1
GLOVE EUDERMIC 7 POWDERFREE (GLOVE) ×2 IMPLANT
GOWN STRL NON-REIN LRG LVL3 (GOWN DISPOSABLE) ×2 IMPLANT
GOWN STRL REIN XL XLG (GOWN DISPOSABLE) ×4 IMPLANT
KIT BASIN OR (CUSTOM PROCEDURE TRAY) ×2 IMPLANT
NS IRRIG 1000ML POUR BTL (IV SOLUTION) ×2 IMPLANT
PACK GENERAL/GYN (CUSTOM PROCEDURE TRAY) ×2 IMPLANT
SPONGE GAUZE 4X4 12PLY (GAUZE/BANDAGES/DRESSINGS) ×2 IMPLANT
STAPLER VISISTAT 35W (STAPLE) ×2 IMPLANT
SUT ETHILON 3 0 PS 1 (SUTURE) ×2 IMPLANT
SUT NOVA 1 T20/GS 25DT (SUTURE) ×4 IMPLANT
SUT NOVA NAB DX-16 0-1 5-0 T12 (SUTURE) IMPLANT
SUT PDS AB 1 CTX 36 (SUTURE) IMPLANT
SUT PROLENE 0 CT 1 CR/8 (SUTURE) IMPLANT
SUT SILK 2 0 (SUTURE)
SUT SILK 2 0 30  PSL (SUTURE)
SUT SILK 2 0 30 PSL (SUTURE) IMPLANT
SUT SILK 2-0 18XBRD TIE 12 (SUTURE) IMPLANT
SUT VIC AB 0 CTX 27 (SUTURE) ×2 IMPLANT
SUT VIC AB 2-0 CT1 27 (SUTURE)
SUT VIC AB 2-0 CT1 27XBRD (SUTURE) IMPLANT
SUT VIC AB 2-0 CTX 36 (SUTURE) IMPLANT
TAPE CLOTH SURG 4X10 WHT LF (GAUZE/BANDAGES/DRESSINGS) ×1 IMPLANT
TOWEL OR 17X26 10 PK STRL BLUE (TOWEL DISPOSABLE) ×2 IMPLANT
TRAY FOLEY CATH 14FRSI W/METER (CATHETERS) IMPLANT
ventralight ST Mesh (Mesh General) ×1 IMPLANT

## 2011-08-22 NOTE — Transfer of Care (Signed)
Immediate Anesthesia Transfer of Care Note  Patient: Jacob [name]  Procedure(s) Performed: Procedure(s) (LRB): HERNIA REPAIR VENTRAL ADULT (N/A) INSERTION OF MESH (N/A)  Patient Location: PACU  Anesthesia Type: General  Level of Consciousness: awake, alert  and oriented  Airway & Oxygen Therapy: Patient Spontanous Breathing and Patient connected to face mask oxygen  Post-op Assessment: Report given to PACU RN, Post -op Vital signs reviewed and stable and Patient moving all extremities  Post vital signs: Reviewed and stable  Complications: No apparent anesthesia complications

## 2011-08-22 NOTE — Interval H&P Note (Signed)
History and Physical Interval Note:  08/22/2011 9:13 AM  Jacob [name]  has presented today for surgery, with the diagnosis of ventral hernia   The goals of treatment and the various methods of treatment have been discussed with the patient and family. After consideration of risks, benefits and other options for treatment, the patient has consented to  Procedure(s) (LRB): HERNIA REPAIR INCARCERATED VENTRAL ADULT (N/A) INSERTION OF MESH (N/A) as a surgical intervention .  The patients' history has been reviewed and the patient examined today, no change in status, stable for surgery.  I have reviewed the patients' chart and labs.  Questions were answered to the patient's satisfaction.     Ernestene Mention

## 2011-08-22 NOTE — Discharge Instructions (Signed)
Take all of the prescription medication as prescribed.  You may take a shower starting 2 days from the day of surgery.  You may drive a car one week following surgery.  No sports, lifting more than 15 pounds, or tub baths or swimming for 6 weeks.  Call Dr. Jacinto Halim office and make an appointment to see Dr. Derrell Lolling in approximately 10 days to get the staples removed.  See detailed instructions below    [facility] _______[facility]  UMBILICAL OR INGUINAL HERNIA REPAIR: POST OP INSTRUCTIONS  Always review your discharge instruction sheet given to you by the facility where your surgery was performed. IF YOU HAVE DISABILITY OR FAMILY LEAVE FORMS, YOU MUST BRING THEM TO THE OFFICE FOR PROCESSING.   DO NOT GIVE THEM TO YOUR DOCTOR.  1. A  prescription for pain medication may be given to you upon discharge.  Take your pain medication as prescribed, if needed.  If narcotic pain medicine is not needed, then you may take acetaminophen (Tylenol) or ibuprofen (Advil) as needed. 2. Take your usually prescribed medications unless otherwise directed. 3. If you need a refill on your pain medication, please contact your pharmacy.  They will contact our office to request authorization. Prescriptions will not be filled after 5 pm or on week-ends. 4. You should follow a light diet the first 24 hours after arrival home, such as soup and crackers, etc.  Be sure to include lots of fluids daily.  Resume your normal diet the day after surgery. 5. Most patients will experience some swelling and bruising around the umbilicus or in the groin and scrotum.  Ice packs and reclining will help.  Swelling and bruising can take several days to resolve.  6. It is common to experience some constipation if taking pain medication after surgery.  Increasing fluid intake and taking a stool softener (such as Colace) will usually help or prevent this problem from occurring.  A mild laxative (Milk of Magnesia or Miralax)  should be taken according to package directions if there are no bowel movements after 48 hours. 7. Unless discharge instructions indicate otherwise, you may remove your bandages 24-48 hours after surgery, and you may shower at that time.  You may have steri-strips (small skin tapes) in place directly over the incision.  These strips should be left on the skin for 7-10 days.  If your surgeon used skin glue on the incision, you may shower in 24 hours.  The glue will flake off over the next 2-3 weeks.  Any sutures or staples will be removed at the office during your follow-up visit. 8. ACTIVITIES:  You may resume regular (light) daily activities beginning the next day--such as daily self-care, walking, climbing stairs--gradually increasing activities as tolerated.  You may have sexual intercourse when it is comfortable.  Refrain from any heavy lifting or straining until approved by your doctor. a. You may drive when you are no longer taking prescription pain medication, you can comfortably wear a seatbelt, and you can safely maneuver your car and apply brakes. b. RETURN TO WORK:  __________________________________________________________ 9. You should see your doctor in the office for a follow-up appointment approximately 2-3 weeks after your surgery.  Make sure that you call for this appointment within a day or two after you arrive home to insure a convenient appointment time. 10. OTHER INSTRUCTIONS:  __________________________________________________________________________________________________________________________________________________________________________________________  WHEN TO CALL YOUR DOCTOR: 1. Fever over 101.0 2. Inability to urinate 3. Nausea and/or vomiting 4. Extreme swelling or bruising  5. Continued bleeding from incision. 6. Increased pain, redness, or drainage from the incision  The clinic staff is available to answer your questions during regular business hours.  Please don't  hesitate to call and ask to speak to one of the nurses for clinical concerns.  If you have a medical emergency, go to the nearest emergency room or call 911.  A surgeon from [facility] is always on call at the hospital   [address], [address], [place], [place]  [address] ?  [address] [address], [place], [place]   [address] [phone] ? [phone] ? FAX [phone] Web site: [email or web address]

## 2011-08-22 NOTE — Op Note (Signed)
Patient Name:           Jacob [name]   Date of Surgery:        08/22/2011  Pre op Diagnosis:      Incarcerated ventral hernia  Post op Diagnosis:    same  Procedure:                 Repair incarcerated ventral hernia with mesh, partial omentectomy  Surgeon:                     Angelia Mould. Derrell Lolling, M.D., FACS  Assistant:                      none  Operative Indications:   Jacob [name] is a 56 y.o. male. [name] referred by Dr. Melvia Heaps because of a large incarcerated umbilical hernia. His primary care physician is Jacob [name]. The patient has had a very large umbilical hernia for 4 years. It has progressively enlarged. It is painful. He says he cannot push it back in now. He has never had a hernia problem before. He has never had any abdominal or inguinal surgery in the past.  He has numerous comorbidities including BMI of 43, diabetes, hyperlipidemia, gout, depression, and hypertension. These problems are being managed by his primary care physician, although her notes suggest that he is not completely compliant.  On exam he is morbidly obese. There is a large incarcerated umbilical hernia. The hernia sac is at least 8 cm in diameter. There is no obvious infection. The patient screens positive for MRSA. [name] brought to the operating room electively.   Operative Findings:       There was an incarcerated ventral  hernia containing omentum, but the omentum was mostly viable. I had to resect a small amount of this that would not reduce. The defect was approximately 4 cm in size. I repaired the hernia with a 12 cm x 12 cm piece of Ventralight mesh placed as an inlay technique.  Procedure in Detail:          Following the induction of general endotracheal anesthesia the patient's abdomen was prepped and draped in a sterile fashion. Intravenous vancomycin was given. Intravenous Ancef was given. Surgical time out was performed. Local anesthesia was a 50% solution of Exparel injected into the  subcutaneous tissues and the muscle layers. A transverse elliptical incision was made excising the entire umbilicus. Dissection was carried down into the subcutaneous tissue around the large hernia sac. We entered the hernia sac in one area and found there was just omentum present. We slowly opened the hernia sac and once we could see we completely excised the hernia sac all the way down to the fascia. We opened the fascia a little bit on the left and a little bit on the right which facilitated reduction of most of the omentum. One area of omentum we had to resect. There was no bleeding. I could feel with my finger inside the abdominal cavity. There were no adhesions on the undersurface. I brought a 12 cm x 12 cm piece of Bard Ventra-light mesh to the operative field. I was careful to mark the rough side of the mesh as the up side and a smooth coated side down toward the omentum. I sutured this in place with 8 interrupted mattress sutures of #1 Novofil as an inlay technique.  After these sutures were placed and the mesh was reduced and  I  lifted up all the sutures and it did not appear to be any palpable defect in the suture line. We tied all the sutures very carefully. We irrigated the wound extensively. We checked the placement of the mesh one more time and did not feel there was any defect. The fascia was then closed transversely with interrupted sutures of #1 Novofil. Subcutaneous tissue was closed with interrupted sutures of 0 Vicryl. The skin was closed with skin staples and mattress sutures of 3-0 nylon. Clean bandages were placed. The patient emerged from anesthesia rather abruptly and had a lot of coughing and Valsalva but I did not feel any of the sutures pop. Anesthesia was deepened and then Dr. Carney Living brought the patient  out of anesthesia in a more smooth fashion with a different technique. EBL 25 cc. Counts correct. Complications none.     Angelia Mould. Derrell Lolling, M.D., FACS General and Minimally  Invasive Surgery Breast and Colorectal Surgery  08/22/2011 11:10 AM

## 2011-08-22 NOTE — Anesthesia Preprocedure Evaluation (Addendum)
Anesthesia Evaluation  Patient identified by MRN, date of birth, ID band Patient awake  General Assessment Comment:MRSA  Reviewed: Allergy & Precautions, H&P , NPO status , Patient's Chart, lab work & pertinent test results, reviewed documented beta blocker date and time   Airway Mallampati: II TM Distance: >3 FB Neck ROM: Full    Dental  (+) Dental Advisory Given and Teeth Intact   Pulmonary neg pulmonary ROS,  breath sounds clear to auscultation        Cardiovascular hypertension, Pt. on medications Rhythm:Regular Rate:Normal  Denies cardiac symptoms   Neuro/Psych negative neurological ROS  negative psych ROS   GI/Hepatic negative GI ROS, Neg liver ROS,   Endo/Other  Diabetes mellitus-, Poorly Controlled, Type 2, Oral Hypoglycemic AgentsMorbid obesityGout  Renal/GU negative Renal ROS  negative genitourinary   Musculoskeletal   Abdominal   Peds negative pediatric ROS (+)  Hematology negative hematology ROS (+)   Anesthesia Other Findings   Reproductive/Obstetrics negative OB ROS                          Anesthesia Physical Anesthesia Plan  ASA: III  Anesthesia Plan: General   Post-op Pain Management:    Induction: Intravenous  Airway Management Planned: Oral ETT  Additional Equipment:   Intra-op Plan:   Post-operative Plan: Extubation in OR  Informed Consent: I have reviewed the patients History and Physical, chart, labs and discussed the procedure including the risks, benefits and alternatives for the proposed anesthesia with the patient or authorized representative who has indicated his/her understanding and acceptance.   Dental advisory given  Plan Discussed with: CRNA and Surgeon  Anesthesia Plan Comments:         Anesthesia Quick Evaluation

## 2011-08-22 NOTE — Progress Notes (Signed)
Dr. Shireen Quan notified of pt's blood sugar of 219.  New orders given.

## 2011-08-22 NOTE — Progress Notes (Signed)
Dr. Shireen Quan, Anesthes. Aware of SAO2 , nausea, and pain cont; pt going to stepdown postop for cont monitoring

## 2011-08-22 NOTE — Anesthesia Postprocedure Evaluation (Signed)
  Anesthesia Post-op Note  Patient: Jacob [name]  Procedure(s) Performed: Procedure(s) (LRB): HERNIA REPAIR VENTRAL ADULT (N/A) INSERTION OF MESH (N/A)  Patient Location: PACU  Anesthesia Type: General  Level of Consciousness: oriented and sedated  Airway and Oxygen Therapy: Patient Spontanous Breathing and Patient connected to nasal cannula oxygen  Post-op Pain: mild  Post-op Assessment: Post-op Vital signs reviewed, Patient's Cardiovascular Status Stable, Respiratory Function Stable and Patent Airway  Post-op Vital Signs: stable  Complications: No apparent anesthesia complications

## 2011-08-23 ENCOUNTER — Inpatient Hospital Stay (HOSPITAL_COMMUNITY): Payer: Self-pay

## 2011-08-23 DIAGNOSIS — R0609 Other forms of dyspnea: Secondary | ICD-10-CM

## 2011-08-23 DIAGNOSIS — J9811 Atelectasis: Secondary | ICD-10-CM | POA: Diagnosis not present

## 2011-08-23 DIAGNOSIS — R0902 Hypoxemia: Secondary | ICD-10-CM

## 2011-08-23 DIAGNOSIS — R0989 Other specified symptoms and signs involving the circulatory and respiratory systems: Secondary | ICD-10-CM

## 2011-08-23 DIAGNOSIS — G4733 Obstructive sleep apnea (adult) (pediatric): Secondary | ICD-10-CM

## 2011-08-23 DIAGNOSIS — J9819 Other pulmonary collapse: Secondary | ICD-10-CM

## 2011-08-23 DIAGNOSIS — R079 Chest pain, unspecified: Secondary | ICD-10-CM

## 2011-08-23 DIAGNOSIS — E119 Type 2 diabetes mellitus without complications: Secondary | ICD-10-CM

## 2011-08-23 HISTORY — DX: Obstructive sleep apnea (adult) (pediatric): G47.33

## 2011-08-23 HISTORY — DX: Hypoxemia: R09.02

## 2011-08-23 LAB — CBC
HCT: 40.7 % (ref 39.0–52.0)
Hemoglobin: 13 g/dL (ref 13.0–17.0)
RDW: 15.4 % (ref 11.5–15.5)
WBC: 13.9 10*3/uL — ABNORMAL HIGH (ref 4.0–10.5)

## 2011-08-23 LAB — HEMOGLOBIN A1C: Mean Plasma Glucose: 200 mg/dL — ABNORMAL HIGH (ref <117)

## 2011-08-23 LAB — GLUCOSE, CAPILLARY
Glucose-Capillary: 130 mg/dL — ABNORMAL HIGH (ref 70–99)
Glucose-Capillary: 177 mg/dL — ABNORMAL HIGH (ref 70–99)
Glucose-Capillary: 150 mg/dL — ABNORMAL HIGH (ref 70–99)
Glucose-Capillary: 182 mg/dL — ABNORMAL HIGH (ref 70–99)

## 2011-08-23 LAB — BASIC METABOLIC PANEL
BUN: 10 mg/dL (ref 6–23)
Chloride: 97 mEq/L (ref 96–112)
GFR calc Af Amer: >90 mL/min (ref >90)
Glucose, Bld: 134 mg/dL — ABNORMAL HIGH (ref 70–99)
Potassium: 4 mEq/L (ref 3.5–5.1)
Sodium: 134 mEq/L — ABNORMAL LOW (ref 135–145)

## 2011-08-23 MED ORDER — CHLORHEXIDINE GLUCONATE CLOTH 2 % EX PADS
6.0000 | MEDICATED_PAD | Freq: Every day | CUTANEOUS | Status: DC
  Administered 2011-08-23 – 2011-08-26 (×4): 6 via TOPICAL

## 2011-08-23 MED ORDER — INSULIN ASPART 100 UNIT/ML ~~LOC~~ SOLN
0.0000 [IU] | Freq: Three times a day (TID) | SUBCUTANEOUS | Status: DC
  Administered 2011-08-23 (×2): 4 [IU] via SUBCUTANEOUS
  Administered 2011-08-23 – 2011-08-24 (×3): 3 [IU] via SUBCUTANEOUS
  Administered 2011-08-24: 7 [IU] via SUBCUTANEOUS
  Administered 2011-08-25: 3 [IU] via SUBCUTANEOUS
  Administered 2011-08-25: 4 [IU] via SUBCUTANEOUS
  Administered 2011-08-26: 3 [IU] via SUBCUTANEOUS

## 2011-08-23 MED ORDER — FUROSEMIDE 10 MG/ML IJ SOLN
40.0000 mg | Freq: Once | INTRAMUSCULAR | Status: AC
  Administered 2011-08-23: 40 mg via INTRAVENOUS
  Filled 2011-08-23: qty 4

## 2011-08-23 MED ORDER — DOXYCYCLINE HYCLATE 100 MG PO TABS
100.0000 mg | ORAL_TABLET | Freq: Two times a day (BID) | ORAL | Status: DC
  Administered 2011-08-23 – 2011-08-26 (×7): 100 mg via ORAL
  Filled 2011-08-23 (×8): qty 1

## 2011-08-23 MED ORDER — SALINE SPRAY 0.65 % NA SOLN
1.0000 | NASAL | Status: DC | PRN
  Administered 2011-08-23: 1 via NASAL
  Filled 2011-08-23: qty 44

## 2011-08-23 MED ORDER — ALBUTEROL SULFATE (5 MG/ML) 0.5% IN NEBU
2.5000 mg | INHALATION_SOLUTION | Freq: Two times a day (BID) | RESPIRATORY_TRACT | Status: DC
  Administered 2011-08-23 – 2011-08-24 (×3): 2.5 mg via RESPIRATORY_TRACT
  Filled 2011-08-23 (×3): qty 0.5

## 2011-08-23 MED ORDER — PANTOPRAZOLE SODIUM 40 MG PO TBEC
40.0000 mg | DELAYED_RELEASE_TABLET | Freq: Every day | ORAL | Status: DC
  Administered 2011-08-23 – 2011-08-26 (×4): 40 mg via ORAL
  Filled 2011-08-23 (×4): qty 1

## 2011-08-23 MED ORDER — ALBUTEROL SULFATE (5 MG/ML) 0.5% IN NEBU: 2.5000 mg | INHALATION_SOLUTION | RESPIRATORY_TRACT | Status: DC | PRN

## 2011-08-23 MED ORDER — MUPIROCIN 2 % EX OINT
1.0000 "application " | TOPICAL_OINTMENT | Freq: Two times a day (BID) | CUTANEOUS | Status: DC
  Administered 2011-08-23 – 2011-08-26 (×7): 1 via NASAL
  Filled 2011-08-23: qty 22

## 2011-08-23 MED ORDER — POLYETHYLENE GLYCOL 3350 17 G PO PACK
17.0000 g | PACK | Freq: Every day | ORAL | Status: DC
  Administered 2011-08-23: 17 g via ORAL
  Filled 2011-08-23 (×2): qty 1

## 2011-08-23 NOTE — Progress Notes (Signed)
CARE MANAGEMENT NOTE 08/23/2011  Patient:  Jacob [name], Jacob [name]   Account Number:  [id number]  Date Initiated:  08/23/2011  Documentation initiated by:  Nera Haworth  Subjective/Objective Assessment:   patient had extensive umbillica hernia repair on [id number] s/p became hypoxic and could not maitain o2 sat dropped down into the 70's.  Placed on venti mask and held in sdu     Action/Plan:   lives at home will need sleep study after recovery.   Anticipated DC Date:  08/26/2011   Anticipated DC Plan:  HOME/SELF CARE  In-house referral  NA      DC Planning Services  NA      [facility] Choice  NA   Choice offered to / List presented to:  NA   DME arranged  NA      DME agency  NA     HH arranged  NA      HH agency  NA   Status of service:  In process, will continue to follow Medicare Important Message given?  NA - LOS <3 / Initial given by admissions (If response is "NO", the following Medicare IM given date fields will be blank) Date Medicare IM given:   Date Additional Medicare IM given:    Discharge Disposition:    Per UR Regulation:  Reviewed for med. necessity/level of care/duration of stay  If discussed at Long Length of Stay Meetings, dates discussed:    Comments:  [id number]/Trinita Devlin Lorrin Mais Case Management [phone]

## 2011-08-23 NOTE — Progress Notes (Addendum)
eLink Physician-Brief Progress Note Patient Name: ASIR BINGLEY DOB: [birth year] MRN: [id number]  Date of Service  08/23/2011   HPI/Events of Note   Progressive hypoxia requiring venti mask  eICU Interventions  Dc IVFs, obtain CXR & labs  Lasix 40 x 1   Intervention Category Major Interventions: Hypoxemia - evaluation and management  ALVA,RAKESH V. 08/23/2011, 1:17 AM

## 2011-08-23 NOTE — Progress Notes (Signed)
Pt continues to require more 02. Placed on VM 40 % at 0100. Desats as low as high 70% on 6 LNC at rest. Pt denies SOB, denies dizziness, does not turn dusky or blue with low sats. No CXR ordered this admission. Dr Vassie Loll at [facility] called and notified of patient hx, surgery and current status. Am labs ordered as well as CXR. Chest Xray resulted orders received for lasix 40mg  IVP and IV fluids d/cd. Orders carried out, will continue to monitor closely.

## 2011-08-23 NOTE — Progress Notes (Signed)
Pt ambulated greater than 40 feet on room air. Pt oxygen saturation dropped to 80-86%. Pt had no complaints of shortness of breath or difficulty breathing.  This is the second ambulation of the day.  Reapplied oxygen at 5L nasal cannula. Pt oxygen is 90-92% on 5L. Increased oxygen rate from 4L to 5L after this ambulation.  Will continue to monitor patient.  Pt tolerated ambulation well, with standby assist.

## 2011-08-23 NOTE — Progress Notes (Signed)
The patient is receiving Protonix by the intravenous route.  Based on criteria approved by the Pharmacy and Therapeutics Committee and the Medical Executive Committee, the medication is being converted to the equivalent oral dose form.  These criteria include: -No Active GI bleeding -Able to tolerate diet of full liquids (or better) or tube feeding OR able to tolerate other medications by the oral or enteral route  If you have any questions about this conversion, please contact the Pharmacy Department (ext [phone]).  Thank you.  Berkley Harvey, [place] 08/23/2011 11:00 AM

## 2011-08-23 NOTE — Progress Notes (Deleted)
This note also relates to the following rows which could not be included:  Dose - Cannot attach notes to extension rows

## 2011-08-23 NOTE — Progress Notes (Signed)
Dr Derrell Lolling at bedside updated on pt status and new orders received by CCM. Md to call in CCM consult for today.

## 2011-08-23 NOTE — Progress Notes (Signed)
1 Day Post-Op  Subjective: Patient admitted postoperatively due to problems with intermittent O2 desaturation. He remains alert. Mild nausea. Tolerating liquids. Voiding okay. Up to chair and ambulating in the room. Mental status has been normal.  During the night he has had to go onto a Ventimask. Oxygen saturations ranged between 85 and 95. Chest x-ray suggest possible left lower lobe atelectasis or consolidation. He was given Lasix by the critical care medicine doctor Last night.  He states that his abdomen hurts but he is otherwise doing okay. WBC 13,900. Rest of labs okay.  Objective: Vital signs in last 24 hours: Temp:  [97.4 F (36.3 C)-99.1 F (37.3 C)] 99.1 F (37.3 C) (04/23 0404) Pulse Rate:  [68-96] 89  (04/23 0404) Resp:  [12-23] 19  (04/23 0404) BP: (98-156)/(54-116) 107/66 mmHg (04/23 0334) SpO2:  [82 %-97 %] 87 % (04/23 0404) FiO2 (%):  [40 %-50 %] 50 % (04/23 0404) Weight:  [287 lb 0.6 oz (130.2 kg)] 287 lb 0.6 oz (130.2 kg) (04/23 0000)    Intake/Output from previous day: 04/22 0701 - 04/23 0700 In: 3700 [P.O.:240; I.V.:3250; IV Piggyback:210] Out: 2100 [Urine:2050; Blood:50] Intake/Output this shift:    General appearance: alert. Mental status normal. Its skin warm and dry. No obvious increased work of breathing. Resp: lungs seem clear to auscultation anteriorly and laterally. No wheeze or rhonchi detected. Decreased breath sounds basis. GI: morbidly obese. Soft. Wound clean and dry.  Lab Results:  Results for orders placed during the hospital encounter of 08/22/11 (from the past 24 hour(s))  GLUCOSE, CAPILLARY     Status: Abnormal   Collection Time   08/22/11  7:17 AM      Component Value Range   Glucose-Capillary 219 (*) 70 - 99 (mg/dL)  GLUCOSE, CAPILLARY     Status: Abnormal   Collection Time   08/22/11 12:01 PM      Component Value Range   Glucose-Capillary 211 (*) 70 - 99 (mg/dL)   Comment 1 Documented in Chart     Comment 2 Notify RN    CBC      Status: Abnormal   Collection Time   08/22/11  2:31 PM      Component Value Range   WBC 14.7 (*) 4.0 - 10.5 (K/uL)   RBC 4.80  4.22 - 5.81 (MIL/uL)   Hemoglobin 13.0  13.0 - 17.0 (g/dL)   HCT 16.1  09.6 - 04.5 (%)   MCV 83.5  78.0 - 100.0 (fL)   MCH 27.1  26.0 - 34.0 (pg)   MCHC 32.4  30.0 - 36.0 (g/dL)   RDW 40.9  81.1 - 91.4 (%)   Platelets 173  150 - 400 (K/uL)  CREATININE, SERUM     Status: Normal   Collection Time   08/22/11  2:31 PM      Component Value Range   Creatinine, Ser 0.96  0.50 - 1.35 (mg/dL)   GFR calc non Af Amer >90  >90 (mL/min)   GFR calc Af Amer >90  >90 (mL/min)  GLUCOSE, CAPILLARY     Status: Abnormal   Collection Time   08/23/11  1:04 AM      Component Value Range   Glucose-Capillary 130 (*) 70 - 99 (mg/dL)   Comment 1 Notify RN    CBC     Status: Abnormal   Collection Time   08/23/11  3:48 AM      Component Value Range   WBC 13.9 (*) 4.0 - 10.5 (K/uL)  RBC 4.77  4.22 - 5.81 (MIL/uL)   Hemoglobin 13.0  13.0 - 17.0 (g/dL)   HCT 40.9  81.1 - 91.4 (%)   MCV 85.3  78.0 - 100.0 (fL)   MCH 27.3  26.0 - 34.0 (pg)   MCHC 31.9  30.0 - 36.0 (g/dL)   RDW 78.2  95.6 - 21.3 (%)   Platelets 180  150 - 400 (K/uL)  BASIC METABOLIC PANEL     Status: Abnormal   Collection Time   08/23/11  3:48 AM      Component Value Range   Sodium 134 (*) 135 - 145 (mEq/L)   Potassium 4.0  3.5 - 5.1 (mEq/L)   Chloride 97  96 - 112 (mEq/L)   CO2 30  19 - 32 (mEq/L)   Glucose, Bld 134 (*) 70 - 99 (mg/dL)   BUN 10  6 - 23 (mg/dL)   Creatinine, Ser 0.86  0.50 - 1.35 (mg/dL)   Calcium 8.9  8.4 - 57.8 (mg/dL)   GFR calc non Af Amer >90  >90 (mL/min)   GFR calc Af Amer >90  >90 (mL/min)     Studies/Results: @RISRSLT24 @     . amLODipine  10 mg Oral QAC breakfast  . aspirin  81 mg Oral QAC breakfast  .  ceFAZolin (ANCEF) IV  2 g Intravenous 60 min Pre-Op  . Chlorhexidine Gluconate Cloth  6 each Topical Q0600  . colchicine  0.6 mg Oral Daily  . docusate sodium  100 mg Oral  BID  . enoxaparin  40 mg Subcutaneous Q24H  . fluticasone  2 spray Each Nare Daily  . furosemide  40 mg Intravenous Once  . glipiZIDE  10 mg Oral BID AC  . heparin      . heparin  5,000 Units Subcutaneous Once  . HYDROmorphone      . insulin aspart      . lisinopril  40 mg Oral QHS  . loratadine  10 mg Oral Daily  . metoprolol succinate  100 mg Oral QAC breakfast  . mupirocin ointment  1 application Nasal BID  . pantoprazole (PROTONIX) IV  40 mg Intravenous QHS  . simvastatin  20 mg Oral q1800  . sodium chloride  3 mL Intravenous Q12H  . DISCONTD: bupivacaine liposome  20 mL Infiltration Once  . DISCONTD: chlorhexidine  1 application Topical Once  . DISCONTD: insulin aspart  0-15 Units Subcutaneous Q4H  . DISCONTD: insulin aspart  0-15 Units Subcutaneous Q4H  . DISCONTD: quinapril  40 mg Oral QHS  . DISCONTD: vancomycin  1,500 mg Intravenous Once     Assessment/Plan: s/p Procedure(s): HERNIA REPAIR VENTRAL ADULT INSERTION OF MESH  POD #1. No apparent abdominal surgical complications. Allowed diet. Give MiraLAX to mitigate against constipation. Out of bed to chair and ambulating in halls. Incentive spirometry.  Oxygen desaturation. This may be a combination of or are undiagnosed organic sleep apnea and atelectasis and possible left lower lobe pneumonia. Will ask critical care medicine to evaluate. Push incentive spirometry.  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 We'll continue doxycycline because of the implanted mesh.  Diabetes mellitus. Monitor CBCs and sliding scale insulin.  Discharge wants pulmonary status stable.  VTE prophylaxis. On sub cut heparin.  Patient Active Hospital Problem List: No active hospital problems.   LOS: 1 day    Jacob Rena M. Derrell Lolling, PetersenD., [facility] General and Minimally invasive Surgery Breast and Colorectal Surgery Office:   [phone] Pager:   [phone]  08/23/2011  . Marland Kitchen  prob

## 2011-08-23 NOTE — Consult Note (Signed)
Referring physician: Claud Kelp Reason for consult: Hypoxemia Chief Complaint: Ventral hernia Primary care physician: [name]  HISTORY of PRESENT ILLNESS:  Jacob [name] is a 56 y.o. male admitted on 08/22/2011 with progressively enlarging, painful umbilical hernia and had repair of incarcerated ventral hernia with mesh and partial omentectomy.  He was noted to have hypoxemia requiring venti-mask post-op.  PCCM consulted 4/23 to evaluate hypoxemia. PMHx of DM, morbid obesity, hyperlipidemia, depression, gout, HTN.  He reports having chronic sinus congestion.  He gets intermittent episodes of cough, and wheezing.  He has been told he gets asthmatic bronchitis.  He snores, and wakes up gasping for air.  [name] sleepy during the day.  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  He lives with his father, and there is a Tourist information centre manager in his home.  He used to own cats, but does not have any recent animal exposures.  He denies smoking.  He currently feels some sinus congestion and dryness since starting oxygen last night.  He denies chest pain, chest tightness, or wheezing.  He has some cough, but denies sputum.  He has not felt feverish.  He has some abdominal discomfort after surgery, and this makes it difficult for him to take a deep breath.  Antibiotics: Ancef 4/22>>4/22 Doxycycline 4/23>>   Past Medical History  Diagnosis Date  . Umbilical hernia   . Obesity   . Hypertension 08-10-11    tx. meds  . Shortness of breath 08-10-11    with increased activity only  . Diabetes mellitus 08-10-11    tx. oral meds.  . Kidney stones 08-10-11    once in past, none recent  . Neuromuscular disorder 08-10-11    "burning/tingling sensation In feet"  . Arthritis 08-10-11    1 month ago-ankles/feet  . Depression 08-10-11    depression(due to childhood issues)  . Chronic cough 08-10-11    more than 6 yrs from sinus drainage    Past Surgical History    Procedure Date  . Cataract extraction 08-10-11    rt. eye 2-3 months ago.  . Colonoscopy w/ polypectomy 08-10-11    1 month ago    Family History  Problem Relation Age of Onset  . Colon cancer Neg Hx      reports that he has never smoked. He has never used smokeless tobacco. He reports that he does not drink alcohol or use illicit drugs.  No Known Allergies  Medications Prior to Admission  Medication Sig Dispense Refill  . amLODipine (NORVASC) 10 MG tablet Take 10 mg by mouth daily before breakfast.      . aspirin 81 MG chewable tablet Chew 81 mg by mouth daily before breakfast. ON HOLD      . atorvastatin (LIPITOR) 20 MG tablet Take 1 tablet (20 mg total) by mouth at bedtime.  90 tablet  4  . desloratadine (CLARINEX) 5 MG tablet Take 5 mg by mouth daily.        . fluticasone (FLONASE) 50 MCG/ACT nasal spray Place 2 sprays into the nose daily as needed. Allergies      . glipiZIDE (GLUCOTROL) 10 MG tablet Take 1 tablet (10 mg total) by mouth 2 (two) times daily.  180 tablet  3  . ibuprofen (ADVIL,MOTRIN) 800 MG tablet Take 800 mg by mouth every 6 (six) hours as needed. For pain       . metoprolol succinate (TOPROL-XL) 100 MG 24 hr  tablet Take 100 mg by mouth daily before breakfast.       . quinapril (ACCUPRIL) 40 MG tablet Take 1 tablet (40 mg total) by mouth at bedtime.  30 tablet  4  . albuterol (PROVENTIL,VENTOLIN) 90 MCG/ACT inhaler Inhale 1-2 puffs into the lungs every 4 (four) hours as needed for wheezing. 1-2 puffs inhaled every 4-6 hours as needed  17 g  3  . colchicine 0.6 MG tablet Take 0.6 mg by mouth daily as needed. Gout      . glucose blood (ACCU-CHEK AVIVA) test strip for checking sugars two times a day; dispense 2 month's supply       . nitroGLYCERIN (NITROSTAT) 0.4 MG SL tablet Place 1 tablet (0.4 mg total) under the tongue every 5 (five) minutes as needed for chest pain.  100 tablet  3    ROS: 12 point negative except above.  PHYICAL EXAM:  Blood pressure  117/74, pulse 95, temperature 98 F (36.7 C), temperature source Oral, resp. rate 19, weight 287 lb 0.6 oz (130.2 kg), SpO2 87.00%. There is no height on file to calculate BMI.  I/O last 3 completed shifts: In: 3700 [P.O.:240; I.V.:3250; IV Piggyback:210] Out: 2100 [Urine:2050; Blood:50]   General - Obese, sitting in chair, no distress, speaks in full sentences HEENT - PERRLA, EOMI, no sinus tenderness, clear nasal discharge, MP 3, 2+ tonsills, no oral exudate, no LAN Cardiac - s1s2 regular, no murmur, pulses symmetric Chest - decreased breath sounds, normal respiratory excursion, no wheeze/rales Abd - wound site clean, mild tenderness with palpation, + bowel sounds Ext - no edema, cyanosis, clubbing Neuro - normal strength, CN intact Psych - normal mood, behavior   Lab Results  Component Value Date   WBC 13.9* 08/23/2011   HGB 13.0 08/23/2011   HCT 40.7 08/23/2011   MCV 85.3 08/23/2011   PLT 180 08/23/2011  ,  Lab Results  Component Value Date   CREATININE 0.99 08/23/2011   BUN 10 08/23/2011   NA 134* 08/23/2011   K 4.0 08/23/2011   CL 97 08/23/2011   CO2 30 08/23/2011  ,  Lab Results  Component Value Date   ALT 18 08/10/2011   AST 17 08/10/2011   ALKPHOS 91 08/10/2011   BILITOT 0.5 08/10/2011   Lab Results  Component Value Date   TSH 3.700 05/15/2008    Dg Chest Port 1 View  08/23/2011  *RADIOLOGY REPORT*  Clinical Data: Hypoxia  PORTABLE CHEST - 1 VIEW  Comparison: 08/10/2011  Findings: Normal heart size and pulmonary vascularity.  Elevation of the left hemidiaphragm which is stable since the prior study. There is increased opacity in the left lung base suggesting developing consolidation or atelectasis.  Costophrenic angles are not included on the image.  No pneumothorax.  IMPRESSION: Developing consolidation or atelectasis in the left lung base. Changes likely represent pneumonia.  Original Report Authenticated By: Marlon Pel, M.D.    ASSESSMENT/PLAN:   56 yo male  with post-operative hypoxemia likely from atelectasis.  Clinical symptoms, lab results, vitals, and exam are not suggestive of pneumonia.  He also reports snoring, apnea while asleep, and daytime sleepiness.  He has a history of hypertension, and diabetes mellitus.  There is a very high probability he could have sleep apnea.   Hypoxemia -titrate oxygen to keep SpO2 > 92%>>may need to be assessed for home oxygen depending on progress -continue bronchial hygiene with incentive spirometry -ambulate as tolerated -post-op pain control per surgery -will add scheduled bronchodilator therapy  for now with possible hx of asthmatic bronchitis -f/u PA/later CXR 4/24 -he is on doxycycline for post-surgical prophylaxis for his abdomin>>this should be adequate coverage for any possible respiratory infection also.  Would continue his doxycycline for 7 days for his respiratory process.  Snoring, apnea, and daytime sleepiness>>likely has OSA -will start empiric auto CPAP while in hospital -he will need to be scheduled for sleep study as outpt   Rhinitis -add saline nasal spray -continue claritin, flonase  Case discussed with Dr. Derrell Lolling.  Coralyn Helling, MD 08/23/2011, 9:35 AM Pager:  [phone]    Jacob [name] 08/23/2011, 9:06 AM Pager:  [phone]

## 2011-08-23 NOTE — Progress Notes (Signed)
Pt ambulated in hall greater than 22ft.  Pt ambulated on room air; his oxygen saturation dropped to 86%.  Pt had no complaints of shortness of breath or difficulty of breathing. Tolerated ambulation well. Oxygen was reapplied in patient's room.  Pt oxygen saturation is 90-92% on 4L nasal cannula.  Will continue to monitor patient.

## 2011-08-23 NOTE — Progress Notes (Signed)
Spoke with pt about wearing cpap tonight & he doesn't want to wear. He said he doesn't like wearing the [place], so he knows he couldn't tolerate the cpap mask. SpO2 currently 95% on 5L [place]. Will continue to monitor.  Jacqulynn Cadet RRT

## 2011-08-24 ENCOUNTER — Inpatient Hospital Stay (HOSPITAL_COMMUNITY): Payer: Self-pay

## 2011-08-24 DIAGNOSIS — J986 Disorders of diaphragm: Secondary | ICD-10-CM

## 2011-08-24 HISTORY — DX: Disorders of diaphragm: J98.6

## 2011-08-24 LAB — GLUCOSE, CAPILLARY
Glucose-Capillary: 125 mg/dL — ABNORMAL HIGH (ref 70–99)
Glucose-Capillary: 94 mg/dL (ref 70–99)

## 2011-08-24 MED ORDER — POLYETHYLENE GLYCOL 3350 17 G PO PACK
17.0000 g | PACK | Freq: Three times a day (TID) | ORAL | Status: DC
  Administered 2011-08-24 – 2011-08-25 (×6): 17 g via ORAL
  Filled 2011-08-24 (×9): qty 1

## 2011-08-24 NOTE — Progress Notes (Signed)
Spoke to Dr. Dwain Sarna due to pt not having any lab work ordered this AM. He has asked that we wait until the MD rounds this morning and they can decide at that point if we need AM labs.

## 2011-08-24 NOTE — Progress Notes (Signed)
Report called to Carney Bern, RN. Patient to be transferred to [id number]

## 2011-08-24 NOTE — Progress Notes (Signed)
Patient's O2 sats dropped to 80% on 50% venti mask but quickly returned to mid 90s when pt took slow, deep breaths. Pt was changed to 5L [place] to walk to the bathroom after his sats recovered and he now sats 94%.

## 2011-08-24 NOTE — Progress Notes (Signed)
2 Days Post-Op  Subjective: Alert. Up in chair. Ambulating in halls, and subjectively not short of breath when ambulating. Does have a dry cough, nonproductive.  Oxygen saturation 92-96% on 50% mask. Appreciate insights and pulmonary management by critical care medicine.  PA and lateral chest x-ray pending this morning.  Tolerating regular diet. Denies nausea. No stool or flatus yet. IVs at [facility].  Objective: Vital signs in last 24 hours: Temp:  [98 F (36.7 C)-98.9 F (37.2 C)] 98.4 F (36.9 C) (04/24 0400) Pulse Rate:  [76-95] 81  (04/24 0400) Resp:  [13-24] 20  (04/24 0400) BP: (110-148)/(73-80) 133/74 mmHg (04/24 0400) SpO2:  [91 %-96 %] 95 % (04/24 0400) FiO2 (%):  [50 %] 50 % (04/24 0400) Weight:  [284 lb 2.8 oz (128.9 kg)-287 lb 0.6 oz (130.2 kg)] 284 lb 2.8 oz (128.9 kg) (04/24 0026) Last BM Date: 08/22/11  Intake/Output from previous day: 04/23 0701 - 04/24 0700 In: 123 [P.O.:120; I.V.:3] Out: 2050 [Urine:2050] Intake/Output this shift:    General appearance: alert. Mental status normal. Slightly fatigued but in no serious distress. Sitting in chair. Resp: lungs are clear to auscultation. Perhaps some egophony and decreased breath sounds at bases, but no wheeze or rhonchi. GI: morbidly obese. Bowel sounds present but hypoactive. Wound examined and is clean without drainage.  Lab Results:  Results for orders placed during the hospital encounter of 08/22/11 (from the past 24 hour(s))  GLUCOSE, CAPILLARY     Status: Abnormal   Collection Time   08/23/11  8:19 AM      Component Value Range   Glucose-Capillary 177 (*) 70 - 99 (mg/dL)   Comment 1 Documented in Chart     Comment 2 Notify RN    GLUCOSE, CAPILLARY     Status: Abnormal   Collection Time   08/23/11 11:55 AM      Component Value Range   Glucose-Capillary 185 (*) 70 - 99 (mg/dL)   Comment 1 Documented in Chart     Comment 2 Notify RN    GLUCOSE, CAPILLARY     Status: Abnormal   Collection Time   08/23/11   4:32 PM      Component Value Range   Glucose-Capillary 150 (*) 70 - 99 (mg/dL)   Comment 1 Documented in Chart     Comment 2 Notify RN    GLUCOSE, CAPILLARY     Status: Abnormal   Collection Time   08/23/11  9:18 PM      Component Value Range   Glucose-Capillary 182 (*) 70 - 99 (mg/dL)   Comment 1 Documented in Chart     Comment 2 Notify RN       Studies/Results: @RISRSLT24 @     . albuterol  2.5 mg Nebulization BID  . amLODipine  10 mg Oral QAC breakfast  . aspirin  81 mg Oral QAC breakfast  . Chlorhexidine Gluconate Cloth  6 each Topical Q0600  . colchicine  0.6 mg Oral Daily  . docusate sodium  100 mg Oral BID  . doxycycline  100 mg Oral Q12H  . enoxaparin  40 mg Subcutaneous Q24H  . fluticasone  2 spray Each Nare Daily  . glipiZIDE  10 mg Oral BID AC  . insulin aspart  0-20 Units Subcutaneous TID WC  . lisinopril  40 mg Oral QHS  . loratadine  10 mg Oral Daily  . metoprolol succinate  100 mg Oral QAC breakfast  . mupirocin ointment  1 application Nasal BID  .  pantoprazole  40 mg Oral Q1200  . polyethylene glycol  17 g Oral Daily  . simvastatin  20 mg Oral q1800  . sodium chloride  3 mL Intravenous Q12H  . DISCONTD: pantoprazole (PROTONIX) IV  40 mg Intravenous QHS     Assessment/Plan: s/p Procedure(s): HERNIA REPAIR VENTRAL ADULT INSERTION OF MESH  POD #2. Doing reasonably well from an abdominal standpoint. Continue regular diet. Give MiraLAX 3 times today to mitigate against constipation Continue ambulating in halls Incentive spirometry. Allow shower  Oxygen desaturation. Physical exam suggests left lower lobe atelectasis and  sleep apnea more likely than LLL pneumonia. We'll await chest x-ray and pulmonary medicine follow-up.  ? Home management?  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 We'll continue doxycycline for 7 days because of the implanted mesh and possible PNA.  Diabetes mellitus. CBGs less than 150. Sliding scale insulin ordered and available.  The patient will  need to remain hospitalized until his pulmonary status is stable and GI function has improved.  DVT prophylaxis. On subcutaneous heparin.     LOS: 2 days   Afnan Emberton M. Derrell Lolling, M.D., [facility] General and Minimally invasive Surgery Breast and Colorectal Surgery Office:   [phone] Pager:   [phone]  08/24/2011  .

## 2011-08-24 NOTE — Progress Notes (Signed)
Spoke with pt again about wearing cpap & the benefits of it & he agreed to try it. Placed in auto titration mode 6-20 cmH2O with 6L O2 bleed in. Pt wore for about 10 minutes & said it was very uncomfortable & he couldn't wear it. Placed back on 6L [place].  Jacqulynn Cadet RRT

## 2011-08-24 NOTE — Progress Notes (Signed)
Patient arrived via wheelchair to unit from icu. Report received. Patient alert and oriented, assisted to bathroom. Oxygen at 6lpm [place]. mrsa precautions in place.vital signs stable.

## 2011-08-24 NOTE — Progress Notes (Signed)
Patient has refused CPAP. RN receiving the patient on the 5th floor has been made aware. Pt has been transferred.   E link called for a continuous pulse ox order due to patient on 5-6L nasal cannula and occasionally dropping his sats since surgery.

## 2011-08-24 NOTE — Plan of Care (Signed)
Problem: Phase I Progression Outcomes Goal: Incision/dressings dry and intact Outcome: Progressing Incisional dressing remains clean/ dry/ intact, even after patient has been coughing and ambulating several times today

## 2011-08-24 NOTE — Progress Notes (Signed)
Patient placed on Venti mask at 50% for O2 sats hovering around 92% and dropping into the high 80's on 5L nasal cannula. Patient is refusing CPAP at this time but will consider using it if venti mask is not working.

## 2011-08-24 NOTE — Progress Notes (Signed)
Patient remains on venti mask at 50%. His O2 sats have remained around 95-96% throughout the night but he has had one instance when his sats dropped into the 60s for less than 30 seconds and he was woken up and instructed to cough/ deep breath. This quickly brought his sats back up to the mid 90s. The second time he dropped his sats into the low 80s and again returned to normal when he was awoken to cough and deep breath.

## 2011-08-24 NOTE — Progress Notes (Signed)
Jacob [name] is a 56 y.o. male never smoker admitted on 08/22/2011 with progressively enlarging, painful umbilical hernia and had repair of incarcerated ventral hernia with mesh and partial omentectomy. He was noted to have hypoxemia requiring venti-mask post-op. PCCM consulted 4/23 to evaluate hypoxemia.  PMHx of DM, morbid obesity, hyperlipidemia, depression, gout, HTN.  Cultures: MRSA screen 4/22>>POSITIVE  Antibiotics: Ancef 4/22>>4/22  Doxycycline 4/23>>  Tests/events: 4/23 SpO2 on room air with ambulation 86%>>improved to > 90% with 4 liters oxygen  SUBJECTIVE: Denies chest pain, or chest congestion.  Has occasional dry cough.  Feels fatigued with activity.  Did not feel like wearing CPAP last night.  OBJECTIVE:  Blood pressure 107/75, pulse 85, temperature 98.9 F (37.2 C), temperature source Oral, resp. rate 16, height 5\' 9"  (1.753 m), weight 284 lb 2.8 oz (128.9 kg), SpO2 92.00%. Wt Readings from Last 3 Encounters:  08/24/11 284 lb 2.8 oz (128.9 kg)  08/24/11 284 lb 2.8 oz (128.9 kg)  08/10/11 288 lb 9 oz (130.891 kg)   Body mass index is 41.97 kg/(m^2).  I/O last 3 completed shifts: In: 873 [P.O.:360; I.V.:503; IV Piggyback:10] Out: 4100 [Urine:4100]  Physical Exam: General - Obese, sitting in chair, no distress, speaks in full sentences  HEENT - PERRLA, EOMI, no sinus tenderness, clear nasal discharge, MP 3, 2+ tonsills, no oral exudate, no LAN  Cardiac - s1s2 regular, no murmur, pulses symmetric  Chest - decreased breath sounds, normal respiratory excursion, no wheeze/rales  Abd - wound site clean, mild tenderness with palpation, + bowel sounds  Ext - no edema, cyanosis, clubbing  Neuro - normal strength, CN intact  Psych - normal mood, behavior  CBC    Component Value Date/Time   WBC 13.9* 08/23/2011 0348   RBC 4.77 08/23/2011 0348   HGB 13.0 08/23/2011 0348   HCT 40.7 08/23/2011 0348   PLT 180 08/23/2011 0348   MCV 85.3 08/23/2011 0348   MCH 27.3 08/23/2011  0348   MCHC 31.9 08/23/2011 0348   RDW 15.4 08/23/2011 0348   LYMPHSABS 2.4 08/10/2011 1540   MONOABS 0.7 08/10/2011 1540   EOSABS 0.2 08/10/2011 1540   BASOSABS 0.0 08/10/2011 1540   BMET    Component Value Date/Time   NA 134* 08/23/2011 0348   K 4.0 08/23/2011 0348   CL 97 08/23/2011 0348   CO2 30 08/23/2011 0348   GLUCOSE 134* 08/23/2011 0348   BUN 10 08/23/2011 0348   CREATININE 0.99 08/23/2011 0348   CREATININE 1.01 06/01/2011 1613   CALCIUM 8.9 08/23/2011 0348   GFRNONAA >90 08/23/2011 0348   GFRAA >90 08/23/2011 0348    Lab Results  Component Value Date   ALT 18 08/10/2011   AST 17 08/10/2011   ALKPHOS 91 08/10/2011   BILITOT 0.5 08/10/2011   ABG No results found for this basename: phart, pco2, pco2art, po2, po2art, hco3, tco2, acidbasedef, o2sat    Dg Chest 2 View  08/24/2011  *RADIOLOGY REPORT*  Clinical Data: Hypoxia  CHEST - 2 VIEW  Comparison: 08/23/2011  Findings:   Heart size appears normal.  There is asymmetric elevation of the left hemidiaphragm with overlying atelectasis.  Right lung appears clear.  Suspect small left pleural effusion.  IMPRESSION:  1.  Left lower lobe atelectasisand asymmetric elevation of the left hemidiaphragm.  Original Report Authenticated By: Rosealee Albee, M.D.   Dg Chest Port 1 View  08/23/2011  *RADIOLOGY REPORT*  Clinical Data: Hypoxia  PORTABLE CHEST - 1 VIEW  Comparison: 08/10/2011  Findings: Normal heart size and pulmonary vascularity.  Elevation of the left hemidiaphragm which is stable since the prior study. There is increased opacity in the left lung base suggesting developing consolidation or atelectasis.  Costophrenic angles are not included on the image.  No pneumothorax.  IMPRESSION: Developing consolidation or atelectasis in the left lung base. Changes likely represent pneumonia.  Original Report Authenticated By: Marlon Pel, M.D.    ASSESSMENT/PLAN:  Post-operative hypoxemia in setting of obesity with possible OSA/OHS,  atelectasis, and chronically elevated Lt hemidiaphragm -will need to arrange for home oxygen at 4 to 5 liters>>have placed order for care manager to arrange for this -discussed with him the rationale for using PAP therapy, and how this is different than using oxygen>>he is willing to use CPAP while in hospital, and will need outpt sleep study to further assess -not sure what the cause of Lt hemidiaphragm elevation is >> this was present on CXR as far back as 2006 -continue bronchial hygiene and scheduled bronchodilators for now -will check ABG in AM of 4/25 to further assess whether he has hypoventilation -D2/x doxycycline>>he is on this for abdominal prophylaxis, but will also be adequate coverage for any respiratory infections (but unlikely he has a respiratory infection) -ambulate as tolerated -discussed how his weight is likely contributing to his respiratory problems  Rhinitis>>stable -continue saline nasal sprays and flonase  Okay to transfer to medical floor.  Pulmonary service will continue to follow.  Hopefully will be ready for discharge home 4/25.  He will need outpt follow up with pulmonary in one to two weeks after hospital d/c.  Discussed with Dr. Aida Puffer Pager:  [phone] 08/24/2011, 9:17 AM

## 2011-08-25 ENCOUNTER — Inpatient Hospital Stay (HOSPITAL_COMMUNITY): Payer: Self-pay

## 2011-08-25 ENCOUNTER — Telehealth: Payer: Self-pay | Admitting: Pulmonary Disease

## 2011-08-25 DIAGNOSIS — R0689 Other abnormalities of breathing: Secondary | ICD-10-CM | POA: Diagnosis present

## 2011-08-25 DIAGNOSIS — J9620 Acute and chronic respiratory failure, unspecified whether with hypoxia or hypercapnia: Secondary | ICD-10-CM | POA: Diagnosis present

## 2011-08-25 LAB — DIFFERENTIAL
Basophils Relative: 0 % (ref 0–1)
Eosinophils Absolute: 0.2 10*3/uL (ref 0.0–0.7)
Eosinophils Relative: 2 % (ref 0–5)
Lymphs Abs: 1.3 10*3/uL (ref 0.7–4.0)
Monocytes Absolute: 0.8 10*3/uL (ref 0.1–1.0)
Monocytes Relative: 8 % (ref 3–12)
Neutrophils Relative %: 78 % — ABNORMAL HIGH (ref 43–77)

## 2011-08-25 LAB — CBC
HCT: 38.3 % — ABNORMAL LOW (ref 39.0–52.0)
Hemoglobin: 12.3 g/dL — ABNORMAL LOW (ref 13.0–17.0)
MCH: 27.5 pg (ref 26.0–34.0)
MCHC: 32.1 g/dL (ref 30.0–36.0)
MCV: 85.7 fL (ref 78.0–100.0)
RBC: 4.47 MIL/uL (ref 4.22–5.81)

## 2011-08-25 LAB — BASIC METABOLIC PANEL
BUN: 14 mg/dL (ref 6–23)
Creatinine, Ser: 1 mg/dL (ref 0.50–1.35)
GFR calc Af Amer: >90 mL/min (ref >90)
GFR calc non Af Amer: 83 mL/min — ABNORMAL LOW (ref >90)
Glucose, Bld: 156 mg/dL — ABNORMAL HIGH (ref 70–99)
Potassium: 4.5 mEq/L (ref 3.5–5.1)

## 2011-08-25 LAB — BLOOD GAS, ARTERIAL
Acid-Base Excess: 4.3 mmol/L — ABNORMAL HIGH (ref 0.0–2.0)
Drawn by: 336861
O2 Saturation: 89.8 %
TCO2: 27.5 mmol/L (ref 0–100)
pCO2 arterial: 56.6 mmHg — ABNORMAL HIGH (ref 35.0–45.0)
pO2, Arterial: 54.5 mmHg — ABNORMAL LOW (ref 80.0–100.0)

## 2011-08-25 LAB — GLUCOSE, CAPILLARY: Glucose-Capillary: 150 mg/dL — ABNORMAL HIGH (ref 70–99)

## 2011-08-25 MED ORDER — POLYETHYLENE GLYCOL 3350 17 G PO PACK
52.0000 g | PACK | Freq: Once | ORAL | Status: AC
  Administered 2011-08-25: 52 g via ORAL
  Filled 2011-08-25: qty 4

## 2011-08-25 MED ORDER — MILK AND MOLASSES ENEMA
Freq: Two times a day (BID) | RECTAL | Status: DC
  Administered 2011-08-25: 14:00:00 via RECTAL
  Filled 2011-08-25 (×19): qty 250

## 2011-08-25 MED ORDER — SODIUM CHLORIDE 0.9 % IV SOLN: 250.0000 mL | INTRAVENOUS | Status: DC | PRN

## 2011-08-25 MED ORDER — MORPHINE SULFATE 2 MG/ML IJ SOLN: 2.0000 mg | INTRAMUSCULAR | Status: DC | PRN

## 2011-08-25 NOTE — Progress Notes (Signed)
Patient continues to refuse nocturnal PPV.  

## 2011-08-25 NOTE — Telephone Encounter (Signed)
ATC Kathy back, NA and no option given to leave a msg, [facility]

## 2011-08-25 NOTE — Progress Notes (Addendum)
3 Days Post-Op  Subjective: Transferred from SDU to 5W at. 10:00 PM last night.  Voiding well. Passing lots of flatus. Ambulating. Does not feel as well as yesterday. Jacob [name] [name] somewhat nauseated. He has not had a bowel movement. He feels a little distended. Took Miralax ??once.  Denies increased work of breathing or cough or shortness of breath.  Low-grade temp noted 100.1. Heart rate 76. Oxygen saturation 94% on Ventimask.  Objective: Vital signs in last 24 hours: Temp:  [98.6 F (37 C)-100.1 F (37.8 C)] 100.1 F (37.8 C) (04/25 0602) Pulse Rate:  [75-89] 76  (04/25 0602) Resp:  [13-20] 18  (04/25 0602) BP: (107-141)/(66-91) 139/91 mmHg (04/25 0602) SpO2:  [79 %-98 %] 94 % (04/25 0602) Last BM Date: 08/22/11  Intake/Output from previous day: 04/24 0701 - 04/25 0700 In: 605 [P.O.:600; I.V.:3; IV Piggyback:2] Out: 250 [Urine:250] Intake/Output this shift:    General appearance: alert. Cooperative. In bed. Minimal distress. Resp: noticeable decrease breath sounds at left base. Otherwise no rhonchi or wheeze. GI: soft. Bowel sounds very active. Slightly distended but not much. Surgical wound looks good. No drainage. No infection.  Lab Results:  Results for orders placed during the hospital encounter of 08/22/11 (from the past 24 hour(s))  GLUCOSE, CAPILLARY     Status: Abnormal   Collection Time   08/24/11  8:06 AM      Component Value Range   Glucose-Capillary 207 (*) 70 - 99 (mg/dL)   Comment 1 Documented in Chart     Comment 2 Notify RN    GLUCOSE, CAPILLARY     Status: Abnormal   Collection Time   08/24/11 11:54 AM      Component Value Range   Glucose-Capillary 125 (*) 70 - 99 (mg/dL)   Comment 1 Documented in Chart     Comment 2 Notify RN    GLUCOSE, CAPILLARY     Status: Abnormal   Collection Time   08/24/11  4:19 PM      Component Value Range   Glucose-Capillary 133 (*) 70 - 99 (mg/dL)   Comment 1 Documented in Chart     Comment 2 Notify RN    GLUCOSE,  CAPILLARY     Status: Normal   Collection Time   08/24/11  9:28 PM      Component Value Range   Glucose-Capillary 94  70 - 99 (mg/dL)   Comment 1 Notify RN    BLOOD GAS, ARTERIAL     Status: Abnormal   Collection Time   08/25/11  4:58 AM      Component Value Range   FIO2 0.21     pH, Arterial 7.357  7.350 - 7.450    pCO2 arterial 56.6 (*) 35.0 - 45.0 (mmHg)   pO2, Arterial 54.5 (*) 80.0 - 100.0 (mmHg)   Bicarbonate 30.9 (*) 20.0 - 24.0 (mEq/L)   TCO2 27.5  0 - 100 (mmol/L)   Acid-Base Excess 4.3 (*) 0.0 - 2.0 (mmol/L)   O2 Saturation 89.8     Patient temperature 98.6     Collection site RIGHT RADIAL     Drawn by [id number]     Sample type ARTERIAL DRAW     Allens test (pass/fail) PASS  PASS      Studies/Results: @RISRSLT24 @     . albuterol  2.5 mg Nebulization BID  . amLODipine  10 mg Oral QAC breakfast  . aspirin  81 mg Oral QAC breakfast  . Chlorhexidine Gluconate Cloth  6 each  Topical Q0600  . colchicine  0.6 mg Oral Daily  . docusate sodium  100 mg Oral BID  . doxycycline  100 mg Oral Q12H  . enoxaparin  40 mg Subcutaneous Q24H  . fluticasone  2 spray Each Nare Daily  . glipiZIDE  10 mg Oral BID AC  . insulin aspart  0-20 Units Subcutaneous TID WC  . lisinopril  40 mg Oral QHS  . loratadine  10 mg Oral Daily  . metoprolol succinate  100 mg Oral QAC breakfast  . mupirocin ointment  1 application Nasal BID  . pantoprazole  40 mg Oral Q1200  . polyethylene glycol  17 g Oral TID  . simvastatin  20 mg Oral q1800  . sodium chloride  3 mL Intravenous Q12H  . DISCONTD: polyethylene glycol  17 g Oral Daily     Assessment/Plan: s/p Procedure(s): HERNIA REPAIR VENTRAL ADULT INSERTION OF MESH  POD #3. Stable but has not had a bowel movement and is a little distended. Hopefully this is just narcotic induced ileus. Will check abdominal x-rays this morning. Continue MiraLAX. Also try enemas Continue ambulating the hall. [name] not ready to go home.  Hypoxemia in the  setting of OSA, left lower lobe atelectasis, and chronic elevation of left hemidiaphragm. Continue pulmonary management outlined by Dr. Craige Cotta. Will need home O2, bronchodilatiors, sleep study and Pulm medicine followup as outpatient  Low-grade fever. Etiology unclear. We'll check lab work and repeat chest x-ray.  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 We will continue doxycycline for 7 days because of implanted mesh and also to cover possible pulmonary infection  Diabetes mellitus. CBGs less than 150. Stable.  Morbid obesity. Weight loss has been encouraged as a benefit to his health.  Hypertension. Stable.     LOS: 3 days    Khy Pitre M. Derrell Lolling, M.D., [facility] General and Minimally invasive Surgery Breast and Colorectal Surgery Office:   [phone] Pager:   [phone]  08/25/2011  . .prob

## 2011-08-25 NOTE — Progress Notes (Signed)
   CARE MANAGEMENT NOTE 08/25/2011  Patient:  VALENTIN, BENNEY   Account Number:  [id number]  Date Initiated:  08/23/2011  Documentation initiated by:  DAVIS,RHONDA  Subjective/Objective Assessment:   patient had extensive umbillica hernia repair on [id number] s/p became hypoxic and could not maitain o2 sat dropped down into the 70's.  Placed on venti mask and held in sdu     Action/Plan:   lives at home will need sleep study after recovery.   Anticipated DC Date:  08/25/2011   Anticipated DC Plan:  HOME/SELF CARE  In-house referral  NA      DC Planning Services  NA      [facility] Choice  NA   Choice offered to / List presented to:  [name]   DME arranged  OXYGEN      DME agency  [facility]     HH arranged  NA      HH agency  NA   Status of service:  Completed, signed off Medicare Important Message given?  NA - LOS <3 / Initial given by admissions (If response is "NO", the following Medicare IM given date fields will be blank) Date Medicare IM given:   Date Additional Medicare IM given:    Discharge Disposition:  HOME/SELF CARE  Per UR Regulation:  Reviewed for med. necessity/level of care/duration of stay  If discussed at Long Length of Stay Meetings, dates discussed:    Comments:  08/25/11 Hesham Womac RN,BSN NCM [phone] TC [facility] ABOUT HOME 02,NOTED 02 SATS QUALIFIED FOR HOME 02.WILL HAVE SLEEP STUDY OTPT FORM ON CHART FOR MD TO COMPLETE.  [id number] Earlene Plater, RN,BSN,CCM Case Management [phone]

## 2011-08-25 NOTE — Progress Notes (Addendum)
Jacob [name] is a 56 y.o. male never smoker admitted on 08/22/2011 with progressively enlarging, painful umbilical hernia and had repair of incarcerated ventral hernia with mesh and partial omentectomy. He was noted to have hypoxemia requiring venti-mask post-op. PCCM consulted 4/23 to evaluate hypoxemia.  PMHx of DM, morbid obesity, hyperlipidemia, depression, gout, HTN.  Cultures: MRSA screen 4/22>>POSITIVE  Antibiotics: Ancef 4/22>>4/22  Doxycycline 4/23>>  Tests/events: 4/23 SpO2 on room air with ambulation 86%>>improved to > 90% with 4 liters oxygen 4/25 Room air ABG>>pH 7.37, PaCO2 56.6, PaO2 54.5  SUBJECTIVE: He was not able to tolerate CPAP.   [name] not sure if neb treatments are helping as much now.  OBJECTIVE:  Blood pressure 139/91, pulse 76, temperature 100.1 F (37.8 C), temperature source Oral, resp. rate 18, height 5\' 9"  (1.753 m), weight 284 lb 2.8 oz (128.9 kg), SpO2 94.00%. Wt Readings from Last 3 Encounters:  08/24/11 284 lb 2.8 oz (128.9 kg)  08/24/11 284 lb 2.8 oz (128.9 kg)  08/10/11 288 lb 9 oz (130.891 kg)   Body mass index is 41.97 kg/(m^2).  I/O last 3 completed shifts: In: 728 [P.O.:720; I.V.:6; IV Piggyback:2] Out: 1150 [Urine:1150]  Physical Exam: General - Obese HEENT - no sinus tenderness, clear nasal discharge Cardiac - s1s2 regular, no murmur, pulses symmetric  Chest - decreased breath sounds, no wheeze/rales  Abd - mild tenderness with palpation Ext - no edema, cyanosis, clubbing  Neuro - normal strength, CN intact  Psych - normal mood, behavior  CBC    Component Value Date/Time   WBC 13.9* 08/23/2011 0348   RBC 4.77 08/23/2011 0348   HGB 13.0 08/23/2011 0348   HCT 40.7 08/23/2011 0348   PLT 180 08/23/2011 0348   MCV 85.3 08/23/2011 0348   MCH 27.3 08/23/2011 0348   MCHC 31.9 08/23/2011 0348   RDW 15.4 08/23/2011 0348   LYMPHSABS 2.4 08/10/2011 1540   MONOABS 0.7 08/10/2011 1540   EOSABS 0.2 08/10/2011 1540   BASOSABS 0.0 08/10/2011 1540    BMET    Component Value Date/Time   NA 134* 08/23/2011 0348   K 4.0 08/23/2011 0348   CL 97 08/23/2011 0348   CO2 30 08/23/2011 0348   GLUCOSE 134* 08/23/2011 0348   BUN 10 08/23/2011 0348   CREATININE 0.99 08/23/2011 0348   CREATININE 1.01 06/01/2011 1613   CALCIUM 8.9 08/23/2011 0348   GFRNONAA >90 08/23/2011 0348   GFRAA >90 08/23/2011 0348    Lab Results  Component Value Date   ALT 18 08/10/2011   AST 17 08/10/2011   ALKPHOS 91 08/10/2011   BILITOT 0.5 08/10/2011   ABG    Component Value Date/Time   PHART 7.357 08/25/2011 0458   PCO2ART 56.6* 08/25/2011 0458   PO2ART 54.5* 08/25/2011 0458   HCO3 30.9* 08/25/2011 0458   TCO2 27.5 08/25/2011 0458   O2SAT 89.8 08/25/2011 0458    Dg Chest 2 View  08/24/2011  *RADIOLOGY REPORT*  Clinical Data: Hypoxia  CHEST - 2 VIEW  Comparison: 08/23/2011  Findings:   Heart size appears normal.  There is asymmetric elevation of the left hemidiaphragm with overlying atelectasis.  Right lung appears clear.  Suspect small left pleural effusion.  IMPRESSION:  1.  Left lower lobe atelectasisand asymmetric elevation of the left hemidiaphragm.  Original Report Authenticated By: Rosealee Albee, M.D.    ASSESSMENT/PLAN:  Post-operative hypoxemia in setting of obesity with possible OSA/OHS, atelectasis, and chronically elevated Lt hemidiaphragm; ABG is consistent with hypoventilation. -will  need to arrange for home oxygen at 4 to 5 liters -discussed with him the rationale for using PAP therapy, and how this is different than using oxygen>>he would like to wait until he recovers from his abdominal surgery before retrying PAP therapy -not sure what the cause of Lt hemidiaphragm elevation is >> this was present on CXR as far back as 2006 -continue bronchial hygiene -change bronchodilators to prn -D3/x doxycycline>>he is on this for abdominal prophylaxis, but will also be adequate coverage for any respiratory infections (but unlikely he has a respiratory  infection) -ambulate as tolerated -discussed how his weight is likely contributing to his respiratory problems  Rhinitis>>stable -continue saline nasal sprays and flonase  Low grade temperature -f/u CXR -monitor fever curve  I explained to him that he has hypoxic and 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  I discussed the potential adverse cardiovascular consequences that can result from these conditions if therapy can not be optimized.  Specifically we discussed potential for pulmonary hypertension and cor pulmonale.  Also emphasized the importance of weight loss.  He needs to continue supplemental oxygen for now, and will need to use this 24/7 at home.  Will defer further attempts with PAP therapy for now.  He will need sleep study as outpt, and then determine if he needs CPAP vs BPAP.  PCCM will continue to follow while he is in hospital.  Norah Fick Pager:  [phone] 08/25/2011, 7:57 AM

## 2011-08-25 NOTE — Significant Event (Signed)
Desaturated during the daytime today when he fell asleep. He had a long discussion w/ Dr Craige Cotta about his OSA/OHS and chronic L hemidiaphragm elevation (see his note), this was revisited by the rapid response nurse and again on our visit this afternoon. He flat out refuses BIPAP and CPAP support at this time. We explained that oxygen alone may help but is not the best choice.   Plan: -cont pulse ox -try face tent -if he ends up intubated will almost certainly end up with trach.   Anders Simmonds ACNP-BC [facility] Pulmonary/Critical Care Pager # [phone] OR # [phone] if no answer  Coralyn Helling, MD 08/25/2011, 6:23 PM

## 2011-08-25 NOTE — Significant Event (Signed)
Rapid Response Event Note  Overview: Time Called: 1135 Arrival Time: 1140 Event Type: Respiratory;Other (Comment) (desats , apnea)  Initial Focused Assessment: Pt alert , talking,  sats in 90's on venti mask. Had fallen asleep and desats to 40s and became lethargic. Pt states he does not want to use Cpap but will try it again. Notified Dr Craige Cotta.   Interventions: VS obtained. Discussed plan with pt and dangers of not wearing cpap with apnea. Mouth / lip care initiated prior to cpap   Event Summary: pt refused cpap after wearing it for 5 minutes . O2 reapplied via [place] with sats at 92%. RN updated.    at      at          [facility], Ferd Hibbs

## 2011-08-25 NOTE — Progress Notes (Signed)
   CARE MANAGEMENT NOTE 08/25/2011  Patient:  Jacob [name], Jacob [name]   Account Number:  [id number]  Date Initiated:  08/23/2011  Documentation initiated by:  [name],Jacob  Subjective/Objective Assessment:   patient had extensive umbillica hernia repair on [id number] s/p became hypoxic and could not maitain o2 sat dropped down into the 70's.  Placed on venti mask and held in sdu     Action/Plan:   lives at home will need sleep study after recovery.   Anticipated DC Date:     Anticipated DC Plan:  HOME/SELF CARE  In-house referral  NA      DC Planning Services  NA      [facility] Choice  NA   Choice offered to / List presented to:  [name]      DME agency  [facility]     HH arranged  NA      HH agency  NA   Status of service:  In process, will continue to follow Medicare Important Message given?  NA - LOS <3 / Initial given by admissions (If response is "NO", the following Medicare IM given date fields will be blank) Date Medicare IM given:   Date Additional Medicare IM given:    Discharge Disposition:    Per UR Regulation:  Reviewed for med. necessity/level of care/duration of stay  If discussed at Long Length of Stay Meetings, dates discussed:    Comments:  08/25/11 Jacob Goethe RN,BSN NCM [phone] UPDATE:02 SATS ON RA DROPPED,RESP THERAPY INTERVENTION.CPAP @ HOME CAN BE ARRANGE ONCE OTPT SLEEP STUDY PERFORMED.FAXED OTPT SLEEP STUDY FORM TO DR. VINEET [name]-[place] OFFICE W/CONFIRMATION TO TEL#[phone]/FAX#[phone].[facility] Jacob [name] FOLLOWING ALONG FOR DME.WILL NEED HOME 02 ORDERS W/LITER FLOW/FREQ/DURATION IF HOME 02 NEEDED.WILL CONTINUE TO MONITOR.  TC [facility]Jac[name] ABOUT HOME 02,NOTED 02 SATS QUALIFIED FOR HOME 02.WILL HAVE SLEEP STUDY OTPT FORM ON CHART FOR MD TO COMPLETE.  [id number] Jacob Plater, RN,BSN,CCM Case Management [phone]

## 2011-08-26 DIAGNOSIS — E669 Obesity, unspecified: Secondary | ICD-10-CM

## 2011-08-26 LAB — GLUCOSE, CAPILLARY: Glucose-Capillary: 122 mg/dL — ABNORMAL HIGH (ref 70–99)

## 2011-08-26 MED ORDER — DOXYCYCLINE HYCLATE 100 MG PO TABS: 100.0000 mg | ORAL_TABLET | Freq: Two times a day (BID) | ORAL | Status: AC

## 2011-08-26 MED ORDER — HYDROCODONE-ACETAMINOPHEN 5-325 MG PO TABS: 1.0000 | ORAL_TABLET | ORAL | Status: AC | PRN

## 2011-08-26 MED ORDER — SODIUM CHLORIDE 0.9 % IV SOLN: 250.0000 mL | INTRAVENOUS | Status: DC | PRN

## 2011-08-26 NOTE — Progress Notes (Signed)
Pt's sats on RA while awake range from 79-84%.  Venturi mask set up at 50% at HS.  Pt's sats increased to 92% immediately.  Instructed pt to leave mask on during night.  Continuous pulse ox secured to pt.

## 2011-08-26 NOTE — Telephone Encounter (Signed)
Spoke with Olegario Messier at [facility]. She states that the pt is currently at [facility] and was seen by Dr Craige Cotta. She states that he wanted her to have a sleep study done, and so she faxed a form here yesterday and needs him to fill out and fax this back. Mindy, have you seen this form or know status of form? Please advise, thanks!

## 2011-08-26 NOTE — Discharge Summary (Signed)
Patient ID: KOLBEE BOGUSZ [id number] 56 y.o. 1956-04-30  08/22/2011  Discharge date and time: August 26, 2011  Admitting Physician: Ernestene Mention  Discharge Physician: Ernestene Mention  Admission Diagnoses: ventral hernia   Discharge Diagnoses: Incarcerated ventral hernia. Morbid obesity  BMI 43 Postop hypoxemia Atelectasis Chronic elevation left hemidiaphragm Obstructive sleep apnea Type 2 diabetes Anxiety and depression Hypertension Gout   Operations: Procedure(s): HERNIA REPAIR VENTRAL ADULT INSERTION OF MESH  Admission Condition: fair  Discharged Condition: fair  Indication for Admission: NATANEL SNAVELY is a 56 y.o. male. [name] referred by Dr. Melvia Heaps because of a large incarcerated umbilical hernia. His primary care physician is [name].  The patient has had an umbilical hernia for 4 years. It has progressively enlarged. It is painful. He says he cannot push it back in now. He has never had a hernia problem before. He has never had any abdominal or inguinal surgery in the past.  He has numerous comorbidities including BMI of 43, diabetes, hyperlipidemia, gout, depression, and hypertension. These problems are being managed by his primary care physician, although her notes suggest that he is not completely compliant.  He recently saw Dr. [name] for some minor rectal bleeding and the hernia was noticed and he was referred back. I evaluated the patient in my office electively. He was advised to have surgery to repair his incarcerated hernia. He was about is that he is at high risk because of his medical noncompliance and multiple comorbidities.   Hospital Course:on the day of admission the patient was taken to the operating room.for repair his incarcerated ventral hernia through a transverse incision. I completely excised his umbilicus which was huge. I repaired his hernia with inlay mesh. The surgery went well.  Postoperatively he had problems with  hypoxemia and chest x-ray showed left lower lobe atelectasis and elevated left diaphragm. The patient was evaluated by Dr. Craige Cotta of the pulmonary medicine service. He felt the patient had not only obstructive sleep apnea but chronically elevated left hemidiaphragm and  left lower lobe pneumonia or atelectasis.  A variety of oxygen therapy his were offered. The patient declined to use BiPAP or CPAP repeatedly. He took his oxygen off frequently. He was able to ambulate in the hall without any complaint of shortness of breath. He was offered follow with pulmonary medicine and a sleep study and he has refused that.  He progressed in his diet and activities slowly but iwithout complication. He had an ileus for a couple of days but that resolved. He had no wound problems.  On the day of discharge the patient looked good. His parents were with him. He does not appear to be short of breath or any distress.  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 He will return to see Dr. Derrell Lolling in one week to get his staples and sutures removed.  He was strongly encouraged to followup with Dr. Priscella Mann, his primary care physician.  He also offered outpatient followup with Dr. Coralyn Helling  Consults: pulmonary/intensive care  Significant Diagnostic Studies: radiology: CXR: atelectasis on the left  Treatments: surgery: repair incarcerated ventral hernia with mesh  Disposition: Home  Patient Instructions:   Tennis, Mckinnon  Home Medication Instructions ION:[id number]   Printed on:08/26/11 1704  Medication Information                    desloratadine (  CLARINEX) 5 MG tablet Take 5 mg by mouth daily.             ibuprofen (ADVIL,MOTRIN) 800 MG tablet Take 800 mg by mouth every 6 (six) hours as needed. For pain            glucose blood (ACCU-CHEK AVIVA) test strip for checking sugars two times a day; dispense 2 month's supply             glipiZIDE (GLUCOTROL) 10 MG tablet Take 1 tablet (10 mg total) by mouth 2 (two) times daily.           nitroGLYCERIN (NITROSTAT) 0.4 MG SL tablet Place 1 tablet (0.4 mg total) under the tongue every 5 (five) minutes as needed for chest pain.           albuterol (PROVENTIL,VENTOLIN) 90 MCG/ACT inhaler Inhale 1-2 puffs into the lungs every 4 (four) hours as needed for wheezing. 1-2 puffs inhaled every 4-6 hours as needed           aspirin 81 MG chewable tablet Chew 81 mg by mouth daily before breakfast. ON HOLD           metoprolol succinate (TOPROL-XL) 100 MG 24 hr tablet Take 100 mg by mouth daily before breakfast.            atorvastatin (LIPITOR) 20 MG tablet Take 1 tablet (20 mg total) by mouth at bedtime.           colchicine 0.6 MG tablet Take 0.6 mg by mouth daily as needed. Gout           fluticasone (FLONASE) 50 MCG/ACT nasal spray Place 2 sprays into the nose daily as needed. Allergies           amLODipine (NORVASC) 10 MG tablet Take 10 mg by mouth daily before breakfast.           quinapril (ACCUPRIL) 40 MG tablet Take 1 tablet (40 mg total) by mouth at bedtime.           HYDROcodone-acetaminophen (NORCO) 5-325 MG per tablet Take 1-2 tablets by mouth every 4 (four) hours as needed for pain.           doxycycline (VIBRA-TABS) 100 MG tablet Take 1 tablet (100 mg total) by mouth 2 (two) times daily.           HYDROcodone-acetaminophen (NORCO) 5-325 MG per tablet Take 1-2 tablets by mouth every 4 (four) hours as needed for pain.           doxycycline (VIBRA-TABS) 100 MG tablet Take 1 tablet (100 mg total) by mouth 2 (two) times daily.             Activity: activity as tolerated Diet: diabetic diet Wound Care: as directed  Follow-up:  With Dr. Derrell Lolling  in 1 week.  Signed: Angelia Mould. Derrell Lolling, M.D., FACS General and minimally invasive surgery Breast and Colorectal Surgery  08/26/2011, 5:04 PM

## 2011-08-26 NOTE — Telephone Encounter (Signed)
This was placed in VS look at yesterday. Please advise Dr. Craige Cotta, thanks

## 2011-08-26 NOTE — Telephone Encounter (Signed)
Patient is refusing to have interventions for possible sleep apnea at this time.  Will defer ordering sleep study for now.

## 2011-08-26 NOTE — Progress Notes (Signed)
Attempted tp place patient on bipap vision . IPAP 15cm,/EPAP 5cm, 35% Fio2 .VTe 450cc-560. O2 sats 100%. Patient only tolerated 5 minutes before removing mask and refused further interventions

## 2011-08-26 NOTE — Progress Notes (Signed)
Discussed Pulmonary's recommendations with patient in anticipation of discharge.  Pt declined to take home oxygen tank provided by [facility].  Pt states that he want to "go home, lose weight, eat right, and let God sort out the rest".  Informed Dr. Derrell Lolling of patients refusal, paged pulmonary PA.

## 2011-08-26 NOTE — Progress Notes (Addendum)
4 Days Post-Op  Subjective: Patient is stable from GI standpoint. Had 2 or 3 bowel movements. Nausea and abdominal distention resolved. Tolerating regular diet.  From a pulmonary standpoint, he is not doing quite as well. Rapid response team had to be called yesterday  because of desaturation. He continues to decline the use of BiPAP or CPAP. Please see notes from pulmonary medicine.  Chest x-ray yesterday shows left lower lobe atelectasis and chronic elevation of a kidney diaphragm. Abdominal films suggest a mild ileus. Lab work was not  remarkable.  Temp of 100.1 probably secondary to atelectasis.  Objective: Vital signs in last 24 hours: Temp:  [98.6 F (37 C)-100.1 F (37.8 C)] 100.1 F (37.8 C) (04/25 2200) Pulse Rate:  [76-90] 90  (04/25 2200) Resp:  [18-20] 18  (04/25 2200) BP: (115-139)/(65-91) 135/91 mmHg (04/25 2200) SpO2:  [48 %-94 %] 92 % (04/26 0115) Last BM Date: 08/22/11  Intake/Output from previous day:   Intake/Output this shift:    Exam:  General appearance: alert. Mental status normal. No distress. Skin warm and dry. Resp: decreased breath sounds at bases, but perhaps moving air better at left base compared to exam yesterday. No wheeze or rhonchi. GI: abdomen morbidly obese but soft. Nontender. Surgical incision looks good.  Lab Results:  Results for orders placed during the hospital encounter of 08/22/11 (from the past 24 hour(s))  GLUCOSE, CAPILLARY     Status: Abnormal   Collection Time   08/25/11  7:24 AM      Component Value Range   Glucose-Capillary 176 (*) 70 - 99 (mg/dL)  CBC     Status: Abnormal   Collection Time   08/25/11  7:50 AM      Component Value Range   WBC 10.7 (*) 4.0 - 10.5 (K/uL)   RBC 4.47  4.22 - 5.81 (MIL/uL)   Hemoglobin 12.3 (*) 13.0 - 17.0 (g/dL)   HCT 19.1 (*) 47.8 - 52.0 (%)   MCV 85.7  78.0 - 100.0 (fL)   MCH 27.5  26.0 - 34.0 (pg)   MCHC 32.1  30.0 - 36.0 (g/dL)   RDW 29.5  62.1 - 30.8 (%)   Platelets 159  150 - 400  (K/uL)  DIFFERENTIAL     Status: Abnormal   Collection Time   08/25/11  7:50 AM      Component Value Range   Neutrophils Relative 78 (*) 43 - 77 (%)   Neutro Abs 8.3 (*) 1.7 - 7.7 (K/uL)   Lymphocytes Relative 12  12 - 46 (%)   Lymphs Abs 1.3  0.7 - 4.0 (K/uL)   Monocytes Relative 8  3 - 12 (%)   Monocytes Absolute 0.8  0.1 - 1.0 (K/uL)   Eosinophils Relative 2  0 - 5 (%)   Eosinophils Absolute 0.2  0.0 - 0.7 (K/uL)   Basophils Relative 0  0 - 1 (%)   Basophils Absolute 0.0  0.0 - 0.1 (K/uL)  BASIC METABOLIC PANEL     Status: Abnormal   Collection Time   08/25/11  7:50 AM      Component Value Range   Sodium 133 (*) 135 - 145 (mEq/L)   Potassium 4.5  3.5 - 5.1 (mEq/L)   Chloride 96  96 - 112 (mEq/L)   CO2 33 (*) 19 - 32 (mEq/L)   Glucose, Bld 156 (*) 70 - 99 (mg/dL)   BUN 14  6 - 23 (mg/dL)   Creatinine, Ser 6.57  0.50 - 1.35 (mg/dL)  Calcium 8.8  8.4 - 10.5 (mg/dL)   GFR calc non Af Amer 83 (*) >90 (mL/min)   GFR calc Af Amer >90  >90 (mL/min)  GLUCOSE, CAPILLARY     Status: Abnormal   Collection Time   08/25/11 11:58 AM      Component Value Range   Glucose-Capillary 106 (*) 70 - 99 (mg/dL)  GLUCOSE, CAPILLARY     Status: Abnormal   Collection Time   08/25/11  5:16 PM      Component Value Range   Glucose-Capillary 150 (*) 70 - 99 (mg/dL)  GLUCOSE, CAPILLARY     Status: Normal   Collection Time   08/25/11  9:10 PM      Component Value Range   Glucose-Capillary 87  70 - 99 (mg/dL)     Studies/Results: @RISRSLT24 @     . amLODipine  10 mg Oral QAC breakfast  . aspirin  81 mg Oral QAC breakfast  . Chlorhexidine Gluconate Cloth  6 each Topical Q0600  . colchicine  0.6 mg Oral Daily  . docusate sodium  100 mg Oral BID  . doxycycline  100 mg Oral Q12H  . enoxaparin  40 mg Subcutaneous Q24H  . fluticasone  2 spray Each Nare Daily  . glipiZIDE  10 mg Oral BID AC  . insulin aspart  0-20 Units Subcutaneous TID WC  . lisinopril  40 mg Oral QHS  . loratadine  10 mg Oral  Daily  . metoprolol succinate  100 mg Oral QAC breakfast  . milk and molasses   Rectal BID  . mupirocin ointment  1 application Nasal BID  . pantoprazole  40 mg Oral Q1200  . polyethylene glycol  17 g Oral TID  . polyethylene glycol  52 g Oral Once  . simvastatin  20 mg Oral q1800  . sodium chloride  3 mL Intravenous Q12H  . DISCONTD: albuterol  2.5 mg Nebulization BID     Assessment/Plan: s/p Procedure(s): HERNIA REPAIR VENTRAL ADULT INSERTION OF MESH  POD #4. Stable. Ileus resolved and GI function has normalized. [name] ready for discharge home from a surgical and GI standpoint.  Hypoxemia in the setting of OSA, left lower lobe atelectasis and chronic elevation of left hemidiaphragm. Patient declines BiPAP and CPAP. This has been strongly recommended by Dr. Craige Cotta.   Defer management to pulmonary medicine. I am reluctant  to discharge him home until his pulmonary status is more stable or until the patient accepts risks of discharge without BiPAP/CPAP.  It sounds like patient may have to accept the risk of less than optimum outpatient therapy due to unwillingness to use BIPAP/CPAP.  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   Low-grade fever.   .doubt infection. Probably secondary to atelectasis.  Diabetes mellitus. Stable  Morbid obesity  Hypertension.  I told the patient that I would return this afternoon and discharge him home if he and the pulmonary medicine group agree on outpatient  management plans.    LOS: 4 days    Diannah Rindfleisch M. Derrell Lolling, M.D., [facility] General and Minimally invasive Surgery Breast and Colorectal Surgery Office:   [phone] Pager:   [phone]  08/26/2011  . .prob

## 2011-08-26 NOTE — Progress Notes (Addendum)
Jacob [name] is a 56 y.o. male never smoker admitted on 08/22/2011 with progressively enlarging, painful umbilical hernia and had repair of incarcerated ventral hernia with mesh and partial omentectomy. He was noted to have hypoxemia requiring venti-mask post-op. PCCM consulted 4/23 to evaluate hypoxemia.  PMHx of DM, morbid obesity, hyperlipidemia, depression, gout, HTN.  Cultures: MRSA screen 4/22>>POSITIVE  Antibiotics: Ancef 4/22>>4/22  Doxycycline 4/23>>  Tests/events: 4/23 SpO2 on room air with ambulation 86%>>improved to > 90% with 4 liters oxygen 4/25 Room air ABG>>pH 7.37, PaCO2 56.6, PaO2 54.5  SUBJECTIVE/interval: He desaturated again last night down to 78% on room air. Nursing reports he maintains ~90-92% when sleeping on 4 l. When he is awake he can maintain >90 on room air.   OBJECTIVE:  Blood pressure 158/90, pulse 88, temperature 98.8 F (37.1 C), temperature source Oral, resp. rate 18, height 5\' 9"  (1.753 m), weight 128.9 kg (284 lb 2.8 oz), SpO2 90.00%., drops to 78% on room air when sleeping Wt Readings from Last 3 Encounters:  08/24/11 128.9 kg (284 lb 2.8 oz)  08/24/11 128.9 kg (284 lb 2.8 oz)  08/10/11 130.891 kg (288 lb 9 oz)   Body mass index is 41.97 kg/(m^2).  I/O last 3 completed shifts: In: 603 [P.O.:600; I.V.:3] Out: 250 [Urine:250]  Physical Exam: General - Obese HEENT - no sinus tenderness, clear nasal discharge Cardiac - s1s2 regular, no murmur, pulses symmetric  Chest - decreased breath sounds, no wheeze/rales  Abd - mild tenderness with palpation Ext - no edema, cyanosis, clubbing  Neuro - normal strength, CN intact  Psych - normal mood, behavior    ABG    Component Value Date/Time   PHART 7.357 08/25/2011 0458   PCO2ART 56.6* 08/25/2011 0458   PO2ART 54.5* 08/25/2011 0458   HCO3 30.9* 08/25/2011 0458   TCO2 27.5 08/25/2011 0458   O2SAT 89.8 08/25/2011 0458    Dg Chest 2 View  08/25/2011  *RADIOLOGY REPORT*  Clinical Data: Postop  umbilical hernia repair.  Shortness of breath with cough.  CHEST - 2 VIEW  Comparison: 08/24/2011 and 08/10/2011.  Findings: There is stable elevation of the left hemidiaphragm with associated left greater than right basilar atelectasis.  The lungs are otherwise clear.  There is no significant pleural effusion or pneumothorax.  Heart size and mediastinal contours are stable.  IMPRESSION: Stable examination with persistent bibasilar atelectasis.  No acute findings identified.  Original Report Authenticated By: Gerrianne Scale, M.D.   Dg Abd 2 Views  08/25/2011  *RADIOLOGY REPORT*  Clinical Data: 56 year old male with abdominal distention, postop fever status post hernia repair.  ABDOMEN - 2 VIEW  Comparison: CT abdomen 06/02/2004.  Findings: Chronic elevation of the left hemidiaphragm.  Increased associated streaky left basilar opacity.  Lower abdominal horizontal skin staples.  Prominent gas filled transverse colon. Bowel gas is present to the level of the rectum with no dilated small bowel loops.  No pneumoperitoneum. No acute osseous abnormality identified.  IMPRESSION: 1.  No free air or evidence of mechanical bowel obstruction.  Gas filled loops may reflect mild postoperative ileus. 2.  Chronic elevation of the left hemidiaphragm, increased associated left basilar airspace disease.  Original Report Authenticated By: Harley Hallmark, M.D.    ASSESSMENT/PLAN:  Post-operative hypoxemia in setting of obesity with possible OSA/OHS, atelectasis, and chronically elevated Lt hemidiaphragm; ABG is consistent with hypoventilation. There is a large component of this that is chronic. He flat out refused CPAP and BIPAP. He also has been taking  off his oxygen. We again reminded him that desaturation events on a chronic basis will dramatically decrease how long he lives and the quality of life that he can expect to have. We have told him our Formal Recommendation is Oxygen 4 liters at [facility] MAND[facility]. Also have  recommended that he have a Sleep study to determine optimal CPAP or BIPAP requirements. He flat out refuses this at this point. We have encouraged him to research the different possibilities for CPAP head gear with the local home health agencies. We have encouraged him to follow up with his primary MD. We have nothing else to offer if he is not willing to be compliant. He was encouraged to have his primary MD set him up with a re-referral to our office for a Sleep Study and further recommendations should he be willing to make more of a commitment to his health.   [name] OK for d/c from our stand-point assuming he will use the nocturnal oxygen.    Anders Simmonds ACNP-BC [facility] Pulmonary/Critical Care Pager # [phone] OR # [phone] if no answer    Attending Addendum:  I have seen the patient, discussed the issues, test results and plans with Kreg Shropshire, NP. I agree with the Assessment and Plans as ammended above.   Levy Pupa, MD, PhD 08/26/2011, 2:40 PM [facility] [phone] or if no answer [phone]

## 2011-08-29 ENCOUNTER — Encounter (HOSPITAL_COMMUNITY): Payer: Self-pay | Admitting: General Surgery

## 2011-09-09 ENCOUNTER — Encounter (INDEPENDENT_AMBULATORY_CARE_PROVIDER_SITE_OTHER): Payer: Self-pay | Admitting: General Surgery

## 2011-09-09 ENCOUNTER — Ambulatory Visit (INDEPENDENT_AMBULATORY_CARE_PROVIDER_SITE_OTHER): Payer: Self-pay | Admitting: General Surgery

## 2011-09-09 DIAGNOSIS — K436 Other and unspecified ventral hernia with obstruction, without gangrene: Secondary | ICD-10-CM

## 2011-09-09 NOTE — Patient Instructions (Signed)
Your hernia surgery is healing without any obvious complication. We removed all the staples and sutures today. You may continue to shower.  I strongly recommend that you do not play sports or lift anything more than 20 pounds or do any yard work for 6 weeks from the date of surgery.  Return to see Dr. Derrell Lolling in 2 months.

## 2011-09-09 NOTE — Progress Notes (Signed)
Subjective:     Patient ID: Jacob [name], male   DOB: January 14, 1956, 56 y.o.   MRN: [id number]  HPI This morbidly obese 56 year old man underwent open repair of incarcerated periumbilical ventral hernia with mesh;   date of surgery August 22, 2011. This required complete resection of the umbilicus. He has done well so far. Appetite normal. Bowel function normal. No wound problems.  Review of Systems     Objective:   Physical Exam Patient looks good. In no distress. Morbidly obese  Abdomen soft and nontender. Transverse incision mid abdomen is healing uneventfully. Normal amount of thickening as would be expected with this type of repair but no fluid collection or drainage. Staples and sutures are removed.    Assessment:     Incarcerated ventral hernia, recovering uneventfully in the early postop period following partial omentectomy and inlay repair with mesh    Plan:     No sports, heavy lifting, or yard work for 6 weeks from the date of surgery. I had to stress this repeatedly to the patient.  Return to see me in 2 months.   Angelia Mould. Derrell Lolling, M.D., [facility] General and Minimally invasive Surgery Breast and Colorectal Surgery Office:   [phone] Pager:   [phone]

## 2011-11-09 ENCOUNTER — Encounter (INDEPENDENT_AMBULATORY_CARE_PROVIDER_SITE_OTHER): Payer: Self-pay | Admitting: General Surgery

## 2011-11-09 ENCOUNTER — Ambulatory Visit (INDEPENDENT_AMBULATORY_CARE_PROVIDER_SITE_OTHER): Payer: PRIVATE HEALTH INSURANCE | Admitting: General Surgery

## 2011-11-09 VITALS — BP 110/76 | HR 88 | Temp 98.0°F | Ht 69.0 in | Wt 280.6 lb

## 2011-11-09 DIAGNOSIS — K436 Other and unspecified ventral hernia with obstruction, without gangrene: Secondary | ICD-10-CM

## 2011-11-09 NOTE — Progress Notes (Signed)
Subjective:     Patient ID: Jacob [name], male   DOB: [birth year], 56 y.o.   MRN: [id number]  HPI This is a morbidly obese gentleman with multiple medical problems, on permanent disability. He underwent open repair of ventral hernia with mesh on August 22, 2011. He has no complaints. The wound has healed well and he has no pain.  Review of Systems     Objective:   Physical Exam The patient is alert and in no distress. Mental status normal.  Abdomen is obese. Soft. The hernia repair is intact. No evidence of recurrence. No evidence of infection. Well-healed transverse incision. Umbilicus surgically absent.    Assessment:     Incarcerated ventral hernia, status post open repair with mesh and resection of umbilicus.  Morbid obesity  Multiple medical problems.    Plan:     He was advised to lose weight.  Resume all normal physical activities that he was capable of preop. No specific restriction  Return to see me p.r.n.   Angelia Mould. Derrell Lolling, M.D., [facility] General and Minimally invasive Surgery Breast and Colorectal Surgery Office:   [phone] Pager:   [phone]

## 2011-11-09 NOTE — Patient Instructions (Signed)
Your ventral hernia repair has healed without any obvious complication.  You may resume normal physical activities that you were capable of prior to the surgery.  Return to see Dr. Derrell Lolling if any new problems arise.

## 2011-12-19 ENCOUNTER — Other Ambulatory Visit: Payer: Self-pay | Admitting: Family Medicine

## 2011-12-19 ENCOUNTER — Other Ambulatory Visit: Payer: Self-pay | Admitting: *Deleted

## 2011-12-19 MED ORDER — QUINAPRIL HCL 40 MG PO TABS: 40.0000 mg | ORAL_TABLET | Freq: Every day | ORAL | Status: DC

## 2011-12-19 MED ORDER — COLCHICINE 0.6 MG PO TABS: 0.6000 mg | ORAL_TABLET | Freq: Every day | ORAL | Status: DC | PRN

## 2011-12-19 MED ORDER — COLCHICINE 0.6 MG PO TABS: 0.6000 mg | ORAL_TABLET | Freq: Every day | ORAL | Status: DC

## 2011-12-19 NOTE — Telephone Encounter (Signed)
[facility] program called.  Received RX for colchicine but that medication is no longer available.  In the past the patient has gotten colcrys 0.6mg .  Needs new RX for Colcrys faxed to MAP program at [phone].  Dr. Madolyn Frieze please print Rx so it can be faxed to [facility].  Ileana Ladd

## 2011-12-30 ENCOUNTER — Other Ambulatory Visit: Payer: Self-pay | Admitting: *Deleted

## 2011-12-30 MED ORDER — AMLODIPINE BESYLATE 10 MG PO TABS: 10.0000 mg | ORAL_TABLET | Freq: Every day | ORAL | Status: DC

## 2011-12-30 NOTE — Telephone Encounter (Signed)
Will you please call in and notify patient. Also, he needs appointment within month for more refills.

## 2011-12-30 NOTE — Telephone Encounter (Signed)
Meds called in ([facility]).  Also asked if they would write on med that "appt must be made/kept for additional refills"   LMOVM of pt informing to call and make appt. [place], Maryjo [place]

## 2012-01-06 ENCOUNTER — Ambulatory Visit (INDEPENDENT_AMBULATORY_CARE_PROVIDER_SITE_OTHER): Payer: Self-pay | Admitting: Family Medicine

## 2012-01-06 ENCOUNTER — Encounter: Payer: Self-pay | Admitting: Family Medicine

## 2012-01-06 VITALS — BP 131/76 | HR 90 | Temp 98.2°F | Ht 69.0 in | Wt 281.0 lb

## 2012-01-06 DIAGNOSIS — E785 Hyperlipidemia, unspecified: Secondary | ICD-10-CM

## 2012-01-06 DIAGNOSIS — K436 Other and unspecified ventral hernia with obstruction, without gangrene: Secondary | ICD-10-CM

## 2012-01-06 DIAGNOSIS — K921 Melena: Secondary | ICD-10-CM

## 2012-01-06 DIAGNOSIS — L909 Atrophic disorder of skin, unspecified: Secondary | ICD-10-CM

## 2012-01-06 DIAGNOSIS — G4733 Obstructive sleep apnea (adult) (pediatric): Secondary | ICD-10-CM

## 2012-01-06 DIAGNOSIS — I10 Essential (primary) hypertension: Secondary | ICD-10-CM

## 2012-01-06 DIAGNOSIS — L918 Other hypertrophic disorders of the skin: Secondary | ICD-10-CM

## 2012-01-06 DIAGNOSIS — E669 Obesity, unspecified: Secondary | ICD-10-CM

## 2012-01-06 MED ORDER — GLIPIZIDE 10 MG PO TABS: 10.0000 mg | ORAL_TABLET | Freq: Two times a day (BID) | ORAL | Status: DC

## 2012-01-06 MED ORDER — METFORMIN HCL 1000 MG PO TABS: 1000.0000 mg | ORAL_TABLET | Freq: Two times a day (BID) | ORAL | Status: DC

## 2012-01-06 MED ORDER — QUINAPRIL HCL 40 MG PO TABS: 40.0000 mg | ORAL_TABLET | Freq: Every day | ORAL | Status: DC

## 2012-01-06 MED ORDER — METOPROLOL SUCCINATE ER 100 MG PO TB24: 100.0000 mg | ORAL_TABLET | Freq: Every day | ORAL | Status: DC

## 2012-01-06 NOTE — Assessment & Plan Note (Signed)
Improved HgbA1c, unclear why.  Patient informed metformin free at [employer]. He would like to try. Start with 500 mg bid and then titrate up to 1 g bid as tolerated. He was supposed to be taking glipizide 10 mg bid but was only taking once a day. [name] amenable to taking it twice a day.   He has received a notice from the ophthalmologist informing him of his routine eye exam. I advised he call them and schedule an appointment. If he needs a referral from [place], he will let us know.

## 2012-01-06 NOTE — Assessment & Plan Note (Signed)
He declines health coach/nutritionist referral at this time Encouraged regular exercise and healthy diet

## 2012-01-06 NOTE — Assessment & Plan Note (Signed)
He did not tolerate CPAP. He will try to lose weight.

## 2012-01-06 NOTE — Progress Notes (Signed)
  Subjective:    Patient ID: Jacob [name], male    DOB: 07/10/1955, 56 y.o.   MRN: [id number]  HPI # Diabetes He takes glipizide once a day He does not check sugars at home ROS: denies lightheadedness or other symptoms of hypoglycemia; complains of occasional paresthesias; complaining of occasional blurred vision  # HTN [name] compliant with medications ROS: denies chest pain. He has SOB with exertion sometimes.   # He wants several skin tags frozen off in his armpits  # OSA He cannot tolerate the CPAP machine He does not feel rested in the morning  Review of Systems Per HPI  Allergies, medication, past medical history reviewed.  Significant for: -Depression, anxiety -Gout -History of incarcerated ventral hernia s/p repair 08/2011. He denies nausea/vomiting/abdominal pain    Objective:   Physical Exam GEN: NAD; obese SKIN: several 1 mm skin tags in axilla bilaterally CV: RRR, no m/r/g PULM: NI WOB; CTAB without w/r/r ABD: soft, NT; hernia incision well healed EXT: no edema FEET: sensation intact; toenail trimmed; no skin lesions    Assessment & Plan:

## 2012-01-06 NOTE — Patient Instructions (Addendum)
For diabetes: -START metformin. Take 1/2 tablet twice a day for a week. Then take 1 tablet twice a day.    Always take with food to prevent nausea -INCREASE glipizide to 1 tablet twice a day  Skin tag:  -Keep clean and dry  Follow-up in 6 months for your diabetes and blood pressure

## 2012-01-06 NOTE — Assessment & Plan Note (Signed)
Controlled. Continue current regimen. 

## 2012-01-06 NOTE — Assessment & Plan Note (Signed)
Several skin tags in his axillary region bilaterally were frozen using nitrogen. He was advised to keep these areas clean and dry.

## 2012-02-07 ENCOUNTER — Other Ambulatory Visit: Payer: Self-pay | Admitting: Family Medicine

## 2012-02-07 MED ORDER — FLUTICASONE PROPIONATE 50 MCG/ACT NA SUSP: 2.0000 | Freq: Every day | NASAL | Status: DC | PRN

## 2012-03-02 ENCOUNTER — Ambulatory Visit (INDEPENDENT_AMBULATORY_CARE_PROVIDER_SITE_OTHER): Payer: Self-pay | Admitting: Family Medicine

## 2012-03-02 VITALS — BP 139/90 | HR 92 | Temp 98.5°F | Ht 69.0 in | Wt 282.0 lb

## 2012-03-02 DIAGNOSIS — L918 Other hypertrophic disorders of the skin: Secondary | ICD-10-CM

## 2012-03-02 DIAGNOSIS — L909 Atrophic disorder of skin, unspecified: Secondary | ICD-10-CM

## 2012-03-02 DIAGNOSIS — Z23 Encounter for immunization: Secondary | ICD-10-CM

## 2012-03-02 DIAGNOSIS — I10 Essential (primary) hypertension: Secondary | ICD-10-CM

## 2012-03-02 MED ORDER — METOPROLOL TARTRATE 50 MG PO TABS: 50.0000 mg | ORAL_TABLET | Freq: Two times a day (BID) | ORAL | Status: DC

## 2012-03-02 NOTE — Patient Instructions (Signed)
Make sure you take the glipizide twice a day   We will change your metoprolol to a form where you will take twice a day

## 2012-03-02 NOTE — Assessment & Plan Note (Signed)
Skin tag removed on left side of neck. Care directions given.

## 2012-03-02 NOTE — Progress Notes (Signed)
  Subjective:    Patient ID: Jacob [name], male    DOB: Apr 28, 1956, 56 y.o.   MRN: [id number]  HPI # He would like skin tag on left side of neck removed because it gets caught on his clothing sometimes and irritates him  # His insurance is no longer covering metoprolol XR and he is asking for it to be changed to something else. [name] tolerating it without side effects, including excessive fatigue ROS: denies chest pain, palpitations  Review of Systems  Allergies, medication, past medical history reviewed.      Objective:   Physical Exam GEN: NAD; obese SKIN: 3 mm raised skin-colored lesion consistent with skin tags  Procedure note: skin tag removal  Written informed consent received, including potential for scarring.  Area cleaned with alcohol swabs x 2, and then injected with 1% plain lidocaine without epinephrine.  Adequate anesthesia achieved.  Skin tags snipped with suture scissors.  Minimal bleeding.  Area covered with gauze and tape.  Patient was advised to keep area clean and dry and apply antibiotic ointment for the next few days to prevent infection.     Assessment & Plan:

## 2012-03-02 NOTE — Assessment & Plan Note (Signed)
We will change from XR to regular metoprolol at same dose. Follow-up next month. Blood pressure elevated today but he does get anxious and is having skin tag removed.

## 2012-03-05 ENCOUNTER — Telehealth: Payer: Self-pay | Admitting: *Deleted

## 2012-03-05 MED ORDER — METOPROLOL SUCCINATE ER 100 MG PO TB24: ORAL_TABLET | ORAL | Status: DC

## 2012-03-05 NOTE — Telephone Encounter (Signed)
He had been getting Toprol XL and taking 150 mg daily.  We will continue.  Aggie Cosier says he gets this medication through [employer] program and there has been no change regarding patient's eligibility for this medication.

## 2012-03-05 NOTE — Telephone Encounter (Signed)
Received phone call from  [facility] pharmacy stating they do have metoprolol  XL but MD sent in order for plain metoprorol 50 mg twice daily. Explained that patient had told MD that they didn't have this.   Will ask MD to send in another RX.    Also they want to be sure of dosage because patient had been on 150 mg in past.

## 2012-06-27 ENCOUNTER — Encounter: Payer: Self-pay | Admitting: Gastroenterology

## 2012-10-30 ENCOUNTER — Telehealth: Payer: Self-pay | Admitting: *Deleted

## 2012-10-30 NOTE — Telephone Encounter (Signed)
Fax requesting Proventil HFA Wyatt Haste, RN-BSN

## 2012-10-31 MED ORDER — ALBUTEROL SULFATE HFA 108 (90 BASE) MCG/ACT IN AERS: 2.0000 | INHALATION_SPRAY | Freq: Four times a day (QID) | RESPIRATORY_TRACT | Status: DC | PRN

## 2012-10-31 NOTE — Telephone Encounter (Signed)
refilled 

## 2012-11-27 ENCOUNTER — Other Ambulatory Visit: Payer: Self-pay | Admitting: *Deleted

## 2012-11-27 MED ORDER — QUINAPRIL HCL 40 MG PO TABS: 40.0000 mg | ORAL_TABLET | Freq: Every day | ORAL | Status: DC

## 2013-03-06 ENCOUNTER — Ambulatory Visit: Payer: Self-pay

## 2013-03-13 ENCOUNTER — Ambulatory Visit: Payer: No Typology Code available for payment source

## 2013-03-15 ENCOUNTER — Encounter: Payer: Self-pay | Admitting: Gastroenterology

## 2013-04-04 ENCOUNTER — Other Ambulatory Visit: Payer: Self-pay | Admitting: Family Medicine

## 2013-04-09 ENCOUNTER — Ambulatory Visit: Payer: No Typology Code available for payment source

## 2013-04-10 ENCOUNTER — Ambulatory Visit (INDEPENDENT_AMBULATORY_CARE_PROVIDER_SITE_OTHER): Payer: No Typology Code available for payment source | Admitting: Family Medicine

## 2013-04-10 ENCOUNTER — Encounter: Payer: Self-pay | Admitting: Family Medicine

## 2013-04-10 VITALS — BP 138/88 | HR 92 | Temp 98.3°F | Wt 284.0 lb

## 2013-04-10 DIAGNOSIS — I10 Essential (primary) hypertension: Secondary | ICD-10-CM

## 2013-04-10 DIAGNOSIS — K635 Polyp of colon: Secondary | ICD-10-CM

## 2013-04-10 DIAGNOSIS — E782 Mixed hyperlipidemia: Secondary | ICD-10-CM

## 2013-04-10 DIAGNOSIS — IMO0001 Reserved for inherently not codable concepts without codable children: Secondary | ICD-10-CM

## 2013-04-10 DIAGNOSIS — R079 Chest pain, unspecified: Secondary | ICD-10-CM

## 2013-04-10 DIAGNOSIS — Z23 Encounter for immunization: Secondary | ICD-10-CM

## 2013-04-10 DIAGNOSIS — D126 Benign neoplasm of colon, unspecified: Secondary | ICD-10-CM

## 2013-04-10 MED ORDER — GLIPIZIDE 10 MG PO TABS: 10.0000 mg | ORAL_TABLET | Freq: Two times a day (BID) | ORAL | Status: DC

## 2013-04-10 MED ORDER — ROSUVASTATIN CALCIUM 20 MG PO TABS: 20.0000 mg | ORAL_TABLET | Freq: Every day | ORAL | Status: DC

## 2013-04-10 MED ORDER — METFORMIN HCL 1000 MG PO TABS: 1000.0000 mg | ORAL_TABLET | Freq: Two times a day (BID) | ORAL | Status: DC

## 2013-04-10 NOTE — Addendum Note (Signed)
Addended by: Jone Baseman D on: 04/10/2013 04:50 PM   Modules accepted: Orders

## 2013-04-10 NOTE — Assessment & Plan Note (Signed)
Not taking lipitor Will try crestor as this is likely at health Dept. CP conserning for CAD Cards referral

## 2013-04-10 NOTE — Addendum Note (Signed)
Addended by: Konrad Dolores, DAVID J on: 04/10/2013 04:00 PM   Modules accepted: Level of Service

## 2013-04-10 NOTE — Assessment & Plan Note (Signed)
Will refer back to GI for colonoscopy due to previous findings

## 2013-04-10 NOTE — Assessment & Plan Note (Addendum)
No taking medications as prescribed A1c of 9.5 is too high INcrease glipizide to 10mg  BID Increase Metformin to 1000mg  BID Recheck in 3 mo and will likely need to add insulin

## 2013-04-10 NOTE — Patient Instructions (Signed)
THank you for coming in today We need to get your sugar and cholesterol under control Please call the GI doctor to schedule your cholonoscopy. I have submitted a referral The cardiologist will call you with an appointment. I would like for them to evaluate you due to your chest pain as I'm concerned that this may be from  yoru heart Please make an appointment with Rosalita Chessman (our Health Coach), for yoru retinal scan and counseling Please come in for fasting blood work when you are able Please come back to see me in 4 weeks for a follow up visit.  Please increase yiour glipizide to 10 mg twice a day and metformin to 1000mg  twice a day

## 2013-04-10 NOTE — Addendum Note (Signed)
Addended by: Oneal Grout on: 04/10/2013 04:54 PM   Modules accepted: Orders

## 2013-04-10 NOTE — Assessment & Plan Note (Signed)
No change. At goal  

## 2013-04-10 NOTE — Progress Notes (Signed)
Patient Identified Concern:  Potentially being homeless in the future. Stage of Change Patient Is In:  TBD, pt really couldn't create any sort of action plan because of depression/anxiety. Patient Reported Barriers:  Pt feels he cant work or is in a place to look for work.  Has not worked since 1993.  Pt has been denied disability 3x.  Pt has seen counselors in the past but is not currently going to counseling.  Patient Reported Perceived Benefits:  TBD Patient Reports Self-Efficacy:   Low pt doesn't believe he can do much of anything.  Behavior Change Supports:  TBD.  Lives with father and step mother. Step mother is not supportive of him living with them.  Mother is alive and according to patient "toxic." Goals:  Pt could not establish any goals.  We talked about finding an activity that gives him joy or meaning in his life.  Pt couldn't identify anything during the visit. Patient Education:  We didn't discuss much education about disease management.  Pt directed conversations towards worries and future concerns.  Pt will call health coach when he is ready to move forward 1-2 weeks.

## 2013-04-10 NOTE — Assessment & Plan Note (Signed)
Will send to cards as w/ significant risk factors for CAD and symptomatic CP on exertion. Will likely need stress test  Will Rx Nitro Already on ASA

## 2013-04-10 NOTE — Progress Notes (Addendum)
Jacob [name] is a 57 y.o. male who presents to [facility] today for f/u   Pt has not been seen in over 1 year.   Colonoscopy: previous colonoscopy w/ numerous polyps. Denies dark tarry BMs, small caliber BMs, unintentionsl wt loss.   DM: Currently taking glipizide and metformin. CBG at home not checked regularly by pt. Glipizide 10mg  Qday and Metformin 500 BID. Trying to eat better but continues to eat carbs and sodas.   HTN: Taking norvasc, quinapril, and metop. CP as below.   HLD: has not been taking lipitor. Denies SOB, palpitations.   CP: Occasional CPs that last 30 min. Described as a tight feeling. Present for a year. Occurs about 2x monthly. No current CP.    The following portions of the patient's history were reviewed and updated as appropriate: allergies, current medications, past medical history, family and social history, and problem list.  Patient is a nonsmoker.   Health Maintenance: flu and pneumovax today, colonoscopy scheduled  Past Medical History  Diagnosis Date  . Umbilical hernia   . Obesity   . Hypertension 08-10-11    tx. meds  . Shortness of breath 08-10-11    with increased activity only  . Diabetes mellitus 08-10-11    tx. oral meds.  . Kidney stones 08-10-11    once in past, none recent  . Neuromuscular disorder 08-10-11    "burning/tingling sensation In feet"  . Arthritis 08-10-11    1 month ago-ankles/feet  . Depression 08-10-11    depression(due to childhood issues)  . Chronic cough 08-10-11    more than 6 yrs from sinus drainage  . Methylprednisolone-induced hypoxia 08/23/2011  . OSA (obstructive sleep apnea) 08/23/2011    ROS as above otherwise neg.    Medications reviewed. Current Outpatient Prescriptions  Medication Sig Dispense Refill  . amLODipine (NORVASC) 10 MG tablet Take 1 tablet (10 mg total) by mouth daily before breakfast.  30 tablet  0  . aspirin 81 MG chewable tablet Chew 81 mg by mouth daily before breakfast. ON HOLD      . glipiZIDE  (GLUCOTROL) 10 MG tablet Take 1 tablet (10 mg total) by mouth 2 (two) times daily.  180 tablet  3  . metoprolol succinate (TOPROL XL) 100 MG 24 hr tablet 1.5 tablets daily. Take with or immediately following a meal.  135 tablet  2  . quinapril (ACCUPRIL) 40 MG tablet Take 1 tablet (40 mg total) by mouth at bedtime.  90 tablet  3  . albuterol (PROVENTIL HFA;VENTOLIN HFA) 108 (90 BASE) MCG/ACT inhaler Inhale 2 puffs into the lungs every 6 (six) hours as needed for wheezing.  2 Inhaler  4  . atorvastatin (LIPITOR) 20 MG tablet Take 1 tablet (20 mg total) by mouth at bedtime.  90 tablet  4  . desloratadine (CLARINEX) 5 MG tablet Take 5 mg by mouth daily.        . fluticasone (FLONASE) 50 MCG/ACT nasal spray Place 2 sprays into the nose daily as needed. Allergies  16 g  3  . glucose blood (ACCU-CHEK AVIVA) test strip for checking sugars two times a day; dispense 2 month's supply       . metFORMIN (GLUCOPHAGE) 1000 MG tablet Take 1 tablet (1,000 mg total) by mouth 2 (two) times daily with a meal.  180 tablet  3  . rosuvastatin (CRESTOR) 20 MG tablet Take 1 tablet (20 mg total) by mouth daily.  90 tablet  3   No current facility-administered  medications for this visit.    Exam:  BP 138/88  Pulse 92  Temp(Src) 98.3 F (36.8 C) (Oral)  Wt 284 lb (128.822 kg) Gen: Well NAD HEENT: EOMI,  MMM Lungs: CTABL Nl WOB Heart: RRR no MRG Abd: NABS, NT, ND Exts: Non edematous BL  LE, warm and well perfused.   Results for orders placed in visit on 04/10/13 (from the past 72 hour(s))  POCT GLYCOSYLATED HEMOGLOBIN (HGB A1C)     Status: Abnormal   Collection Time    04/10/13  2:54 PM      Result Value Range   Hemoglobin A1C 9.5      A/P (as seen in Problem list)  DIABETES MELLITUS, TYPE II, UNCONTROLLED No taking medications as prescribed A1c of 8 is too high INcrease glipizide to 10mg  BID Increase Metformin to 1000mg  BID Recheck in 3 mo  HYPERLIPIDEMIA Not taking lipitor Will try crestor as  this is likely at health Dept. CP conserning for CAD Cards referral  Chest pain Will send to cards as w/ significant risk factors for CAD and symptomatic CP on exertion. Will likely need stress test  Will Rx Nitro Already on ASA    Colonic polyp Will refer back to GI for colonoscopy due to previous findings   HYPERTENSION, BENIGN SYSTEMIC No change. At goal   Pt to meet w/ Health Coach - Rosalita Chessman for routine counseling and retinal scan   Cryotherapy Procedure Note  Pre-operative Diagnosis: inflammed SK  Post-operative Diagnosis: same  Locations:  L temple  Indications: inflammed  Anesthesia: not required   Procedure Details  History of allergy to iodine: no. Pacemaker? no.  Patient informed of risks (permanent scarring, infection, light or dark discoloration, bleeding, infection, weakness, numbness and recurrence of the lesion) and benefits of the procedure and verbal informed consent obtained.  The areas are treated with liquid nitrogen therapy, frozen , allowed to thaw, and treated again. The patient tolerated procedure well.  The patient was instructed on post-op care, warned that there may be blister formation, redness and pain. Recommend OTC analgesia as needed for pain.  Condition: Stable  Complications: none.  Plan: 1. Instructed to keep the area dry and covered for 24-48h and clean thereafter. 2. Warning signs of infection were reviewed.   3. Recommended that the patient use NSAID as needed for pain.  4. Return in PRN .

## 2013-05-15 ENCOUNTER — Ambulatory Visit (INDEPENDENT_AMBULATORY_CARE_PROVIDER_SITE_OTHER): Payer: No Typology Code available for payment source | Admitting: Family Medicine

## 2013-05-15 VITALS — BP 144/90 | HR 105 | Temp 98.5°F | Wt 282.0 lb

## 2013-05-15 DIAGNOSIS — I10 Essential (primary) hypertension: Secondary | ICD-10-CM

## 2013-05-15 DIAGNOSIS — E119 Type 2 diabetes mellitus without complications: Secondary | ICD-10-CM

## 2013-05-15 DIAGNOSIS — E1165 Type 2 diabetes mellitus with hyperglycemia: Secondary | ICD-10-CM

## 2013-05-15 DIAGNOSIS — E782 Mixed hyperlipidemia: Secondary | ICD-10-CM

## 2013-05-15 DIAGNOSIS — IMO0001 Reserved for inherently not codable concepts without codable children: Secondary | ICD-10-CM

## 2013-05-15 DIAGNOSIS — K635 Polyp of colon: Secondary | ICD-10-CM

## 2013-05-15 DIAGNOSIS — D126 Benign neoplasm of colon, unspecified: Secondary | ICD-10-CM

## 2013-05-15 LAB — COMPREHENSIVE METABOLIC PANEL
ALK PHOS: 73 U/L (ref 39–117)
ALT: 31 U/L (ref 0–53)
AST: 28 U/L (ref 0–37)
Albumin: 4 g/dL (ref 3.5–5.2)
BILIRUBIN TOTAL: 0.6 mg/dL (ref 0.3–1.2)
BUN: 9 mg/dL (ref 6–23)
CALCIUM: 9.5 mg/dL (ref 8.4–10.5)
CHLORIDE: 98 meq/L (ref 96–112)
CO2: 32 mEq/L (ref 19–32)
CREATININE: 0.83 mg/dL (ref 0.50–1.35)
Glucose, Bld: 188 mg/dL — ABNORMAL HIGH (ref 70–99)
Potassium: 4.2 mEq/L (ref 3.5–5.3)
Sodium: 138 mEq/L (ref 135–145)
Total Protein: 7.3 g/dL (ref 6.0–8.3)

## 2013-05-15 LAB — LIPID PANEL
CHOL/HDL RATIO: 6.3 ratio
Cholesterol: 176 mg/dL (ref 0–200)
HDL: 28 mg/dL — AB (ref >39)
LDL Cholesterol: 78 mg/dL (ref 0–99)
Triglycerides: 349 mg/dL — ABNORMAL HIGH (ref <150)
VLDL: 70 mg/dL — AB (ref 0–40)

## 2013-05-15 LAB — GLUCOSE, CAPILLARY: GLUCOSE-CAPILLARY: 191 mg/dL — AB (ref 70–99)

## 2013-05-15 MED ORDER — GLUCOSE BLOOD VI STRP: ORAL_STRIP | Status: DC

## 2013-05-15 NOTE — Assessment & Plan Note (Signed)
Slight elevationg today but at goal Pt w/o meds on board today and fasting

## 2013-05-15 NOTE — Patient Instructions (Signed)
Thank you for coming in today Please schedule an appointment to come in for your your mole removal in 2-4 weeks Make this a 30 minute or double appointment time I will let you know if any of your blood work comes back abnormal.

## 2013-05-15 NOTE — Assessment & Plan Note (Signed)
Labs today HD contacted to get glucose test strips ordered Continue glipizide adn metformin

## 2013-05-15 NOTE — Assessment & Plan Note (Signed)
Fasting lipids today.

## 2013-05-15 NOTE — Assessment & Plan Note (Signed)
No further CP.  Would still like pt to see cardiology

## 2013-05-15 NOTE — Assessment & Plan Note (Signed)
Colonoscopy scheduled in near future

## 2013-05-15 NOTE — Progress Notes (Signed)
Jacob [name] is a 58 y.o. male who presents to [facility] today for f/u  DM: Not checking home CBG. Getting meds through HD. Taking metformin and glipizide as prescribed. Denies any shaky episodes or HA or szrs.   HTN: did not take any of BP meds this am due to "being fasting and not wanting to mess with labs from today." pt also very nervous about blood draw today. Denies CP, SOb, palpitations. Cards has not contacted pt for appt yet.   inflammed SK: much improved but still inflammed along the L temple. H/o displastic moles removed by derm in past.   Large lump on back: present for years. Slow growing. Non-painful   Colonoscopy: colonoscopy scheduled early next month.   The following portions of the patient's history were reviewed and updated as appropriate: allergies, current medications, past medical history, family and social history, and problem list.  Patient is a nonsmoker.  Health Maintenance: none today  Past Medical History  Diagnosis Date  . Umbilical hernia   . Obesity   . Hypertension 08-10-11    tx. meds  . Shortness of breath 08-10-11    with increased activity only  . Diabetes mellitus 08-10-11    tx. oral meds.  . Kidney stones 08-10-11    once in past, none recent  . Neuromuscular disorder 08-10-11    "burning/tingling sensation In feet"  . Arthritis 08-10-11    1 month ago-ankles/feet  . Depression 08-10-11    depression(due to childhood issues)  . Chronic cough 08-10-11    more than 6 yrs from sinus drainage  . Methylprednisolone-induced hypoxia 08/23/2011  . OSA (obstructive sleep apnea) 08/23/2011    ROS as above otherwise neg.    Medications reviewed. Current Outpatient Prescriptions  Medication Sig Dispense Refill  . albuterol (PROVENTIL HFA;VENTOLIN HFA) 108 (90 BASE) MCG/ACT inhaler Inhale 2 puffs into the lungs every 6 (six) hours as needed for wheezing.  2 Inhaler  4  . amLODipine (NORVASC) 10 MG tablet Take 1 tablet (10 mg total) by mouth daily before  breakfast.  30 tablet  0  . aspirin 81 MG chewable tablet Chew 81 mg by mouth daily before breakfast. ON HOLD      . atorvastatin (LIPITOR) 20 MG tablet Take 1 tablet (20 mg total) by mouth at bedtime.  90 tablet  4  . desloratadine (CLARINEX) 5 MG tablet Take 5 mg by mouth daily.        . fluticasone (FLONASE) 50 MCG/ACT nasal spray Place 2 sprays into the nose daily as needed. Allergies  16 g  3  . glipiZIDE (GLUCOTROL) 10 MG tablet Take 1 tablet (10 mg total) by mouth 2 (two) times daily.  180 tablet  3  . glucose blood (ACCU-CHEK AVIVA) test strip for checking sugars two times a day; dispense 2 month's supply       . glucose blood (TRUETEST TEST) test strip Use as instructed  100 each  12  . metFORMIN (GLUCOPHAGE) 1000 MG tablet Take 1 tablet (1,000 mg total) by mouth 2 (two) times daily with a meal.  180 tablet  3  . metoprolol succinate (TOPROL XL) 100 MG 24 hr tablet 1.5 tablets daily. Take with or immediately following a meal.  135 tablet  2  . quinapril (ACCUPRIL) 40 MG tablet Take 1 tablet (40 mg total) by mouth at bedtime.  90 tablet  3  . rosuvastatin (CRESTOR) 20 MG tablet Take 1 tablet (20 mg total) by mouth daily.  90 tablet  3   No current facility-administered medications for this visit.    Exam:  BP 144/90  Pulse 105  Temp(Src) 98.5 F (36.9 C) (Oral)  Wt 282 lb (127.914 kg) Gen: Well NAD HEENT: EOMI,  MMM   Results for orders placed in visit on 05/15/13 (from the past 72 hour(s))  GLUCOSE, CAPILLARY     Status: Abnormal   Collection Time    05/15/13  3:08 PM      Result Value Range   Glucose-Capillary 191 (*) 70 - 99 mg/dL   Comment 1 FBS      A/P (as seen in Problem list)  HYPERTENSION, BENIGN SYSTEMIC Slight elevationg today but at goal Pt w/o meds on board today and fasting   Colonic polyp Colonoscopy scheduled in near future  DIABETES MELLITUS, TYPE II, UNCONTROLLED Labs today HD contacted to get glucose test strips ordered Continue glipizide adn  metformin   HYPERLIPIDEMIA Fasting lipids today  Chest pain No further CP.  Would still like pt to see cardiology    Pt to f/u in 2-4 wks for mole removal and inflammed SK liquid nitrogen for resolution. Previously treated lesions much improved to completely resolved.

## 2013-05-22 ENCOUNTER — Ambulatory Visit (AMBULATORY_SURGERY_CENTER): Payer: Self-pay | Admitting: *Deleted

## 2013-05-22 VITALS — Ht 69.0 in | Wt 284.8 lb

## 2013-05-22 DIAGNOSIS — Z860100 Personal history of colon polyps, unspecified: Secondary | ICD-10-CM

## 2013-05-22 MED ORDER — NA SULFATE-K SULFATE-MG SULF 17.5-3.13-1.6 GM/177ML PO SOLN: ORAL | Status: DC

## 2013-05-22 NOTE — Progress Notes (Signed)
Patient denies any allergies to eggs or soy. Patient has sleep apnea, does not use any machines, no CPAP etc..

## 2013-06-05 ENCOUNTER — Other Ambulatory Visit: Payer: Self-pay

## 2013-06-05 ENCOUNTER — Encounter: Payer: Self-pay | Admitting: Gastroenterology

## 2013-06-05 ENCOUNTER — Ambulatory Visit (AMBULATORY_SURGERY_CENTER): Payer: No Typology Code available for payment source | Admitting: Gastroenterology

## 2013-06-05 VITALS — BP 128/74 | HR 85 | Temp 97.4°F | Resp 29 | Ht 69.0 in | Wt 284.0 lb

## 2013-06-05 DIAGNOSIS — G4730 Sleep apnea, unspecified: Secondary | ICD-10-CM

## 2013-06-05 DIAGNOSIS — Z860100 Personal history of colon polyps, unspecified: Secondary | ICD-10-CM

## 2013-06-05 LAB — GLUCOSE, CAPILLARY
Glucose-Capillary: 197 mg/dL — ABNORMAL HIGH (ref 70–99)
Glucose-Capillary: 176 mg/dL — ABNORMAL HIGH (ref 70–99)

## 2013-06-05 MED ORDER — SODIUM CHLORIDE 0.9 % IV SOLN: 500.0000 mL | INTRAVENOUS | Status: DC

## 2013-06-05 NOTE — Progress Notes (Signed)
Patient complains of lightheadedness during IV insertion. Patient supine and cool cloth to head, much improved.

## 2013-06-05 NOTE — Progress Notes (Signed)
A/ox3 pleased with MAC, report to April RN 

## 2013-06-05 NOTE — Patient Instructions (Signed)
Discharge instructions given with verbal understanding. Handouts on diverticulosis and a high fiber diet. Resume previous medications. YOU HAD AN ENDOSCOPIC PROCEDURE TODAY AT THE [facility]: Refer to the procedure report that was given to you for any specific questions about what was found during the examination.  If the procedure report does not answer your questions, please call your gastroenterologist to clarify.  If you requested that your care partner not be given the details of your procedure findings, then the procedure report has been included in a sealed envelope for you to review at your convenience later.  YOU SHOULD EXPECT: Some feelings of bloating in the abdomen. Passage of more gas than usual.  Walking can help get rid of the air that was put into your GI tract during the procedure and reduce the bloating. If you had a lower endoscopy (such as a colonoscopy or flexible sigmoidoscopy) you may notice spotting of blood in your stool or on the toilet paper. If you underwent a bowel prep for your procedure, then you may not have a normal bowel movement for a few days.  DIET: Your first meal following the procedure should be a light meal and then it is ok to progress to your normal diet.  A half-sandwich or bowl of soup is an example of a good first meal.  Heavy or fried foods are harder to digest and may make you feel nauseous or bloated.  Likewise meals heavy in dairy and vegetables can cause extra gas to form and this can also increase the bloating.  Drink plenty of fluids but you should avoid alcoholic beverages for 24 hours.  ACTIVITY: Your care partner should take you home directly after the procedure.  You should plan to take it easy, moving slowly for the rest of the day.  You can resume normal activity the day after the procedure however you should NOT DRIVE or use heavy machinery for 24 hours (because of the sedation medicines used during the test).    SYMPTOMS TO REPORT  IMMEDIATELY: A gastroenterologist can be reached at any hour.  During normal business hours, 8:30 AM to 5:00 PM Monday through Friday, call [phone].  After hours and on weekends, please call the GI answering service at [phone] who will take a message and have the physician on call contact you.   Following lower endoscopy (colonoscopy or flexible sigmoidoscopy):  Excessive amounts of blood in the stool  Significant tenderness or worsening of abdominal pains  Swelling of the abdomen that is new, acute  Fever of 100F or higher FOLLOW UP: If any biopsies were taken you will be contacted by phone or by letter within the next 1-3 weeks.  Call your gastroenterologist if you have not heard about the biopsies in 3 weeks.  Our staff will call the home number listed on your records the next business day following your procedure to check on you and address any questions or concerns that you may have at that time regarding the information given to you following your procedure. This is a courtesy call and so if there is no answer at the home number and we have not heard from you through the emergency physician on call, we will assume that you have returned to your regular daily activities without incident.  SIGNATURES/CONFIDENTIALITY: You and/or your care partner have signed paperwork which will be entered into your electronic medical record.  These signatures attest to the fact that that the information above on your After   Visit Summary has been reviewed and is understood.  Full responsibility of the confidentiality of this discharge information lies with you and/or your care-partner. 

## 2013-06-05 NOTE — Op Note (Signed)
[place]  [address] [place], [address]   COLONOSCOPY PROCEDURE REPORT  PATIENT: Jacob [name], Jacob [name]  MR#: [id number] BIRTHDATE: August 23, 1955 , 48  yrs. old GENDER: Male ENDOSCOPIST: Inda Castle, MD REFERRED BY: PROCEDURE DATE:  06/05/2013 PROCEDURE:   Colonoscopy, diagnostic First Screening Colonoscopy - Avg.  risk and is 50 yrs.  old or older - No.      History of Adenoma - Now for follow-up colonoscopy & has been > or = to 3 yrs.  No.  It has been less than 3 yrs since last colonoscopy.  Other: See Comments  Polyps Removed Today? No. Recommend repeat exam, <10 yrs? Yes.  High risk (family or personal hx). ASA CLASS:   Class III INDICATIONS:Patient's personal history of colon polyps. 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  A digital rectal exam revealed no abnormalities of the rectum.   The LB CF-H180AL Loaner [id number] endoscope was introduced through the anus and advanced to the cecum, which was identified by both the appendix and ileocecal valve. No adverse events experienced.   The quality of the prep was Suprep good  The instrument was then slowly withdrawn as the colon was fully examined.      COLON FINDINGS: Moderate diverticulosis was noted in the sigmoid colon and descending colon.   The colon was otherwise normal. There was no diverticulosis, inflammation, polyps or cancers unless previously stated. The area of prior polypectomy corresponding to the large sigmoid polyp with dysplasia was tattooed and identified.no abnormalities were seen. Retroflexed views revealed no abnormalities. The time to cecum=4 minutes 34 seconds. Withdrawal time=10 minutes 07 seconds.  The scope was withdrawn and the  procedure completed. COMPLICATIONS: There were no complications.  ENDOSCOPIC IMPRESSION: 1.   Moderate diverticulosis was noted in the sigmoid colon and descending colon 2.   The colon was otherwise normal  RECOMMENDATIONS: Colonoscopy 5 years   eSigned:  Inda Castle, MD 06/05/2013 12:07 PM   cc: Linna Darner, MD   PATIENT NAME:  Jacob [name], Jacob [name] MR#: [id number]

## 2013-06-05 NOTE — Progress Notes (Signed)
Patient stating he receives his medications through the Health department, and unable to obtain all of meds. Listed on medication list related to finances.

## 2013-06-06 ENCOUNTER — Telehealth: Payer: Self-pay | Admitting: *Deleted

## 2013-06-06 NOTE — Telephone Encounter (Signed)
  Follow up Call-  Call back number 06/05/2013 07/28/2011  Post procedure Call Back phone  # [phone]  Permission to leave phone message Yes Yes    LMOM

## 2013-06-19 ENCOUNTER — Ambulatory Visit: Payer: Self-pay | Admitting: Family Medicine

## 2013-06-19 ENCOUNTER — Ambulatory Visit (INDEPENDENT_AMBULATORY_CARE_PROVIDER_SITE_OTHER): Payer: No Typology Code available for payment source | Admitting: Family Medicine

## 2013-06-19 VITALS — BP 145/91 | HR 92 | Temp 98.4°F | Wt 284.0 lb

## 2013-06-19 DIAGNOSIS — E782 Mixed hyperlipidemia: Secondary | ICD-10-CM

## 2013-06-19 DIAGNOSIS — K047 Periapical abscess without sinus: Secondary | ICD-10-CM | POA: Insufficient documentation

## 2013-06-19 DIAGNOSIS — K044 Acute apical periodontitis of pulpal origin: Secondary | ICD-10-CM

## 2013-06-19 DIAGNOSIS — I10 Essential (primary) hypertension: Secondary | ICD-10-CM

## 2013-06-19 DIAGNOSIS — L989 Disorder of the skin and subcutaneous tissue, unspecified: Secondary | ICD-10-CM

## 2013-06-19 MED ORDER — CHLORHEXIDINE GLUCONATE 0.12 % MT SOLN: 15.0000 mL | Freq: Two times a day (BID) | OROMUCOSAL | Status: DC

## 2013-06-19 MED ORDER — CLINDAMYCIN HCL 300 MG PO CAPS: 300.0000 mg | ORAL_CAPSULE | Freq: Three times a day (TID) | ORAL | Status: DC

## 2013-06-19 NOTE — Progress Notes (Signed)
Entered in error

## 2013-06-19 NOTE — Progress Notes (Signed)
Jacob [name] is a 58 y.o. male who presents to [facility] today for mole removal  Mole removal: several moles and other suspiscious lesions on face. Previous cryotherapy w/ improvement of some.   HLD: Trying to fill crestor Rx given at last appt. Has confirmed that he can finallly pick up and that insurance will pay.   GUm pain: cap on L mandible for several years. Seen by [facility] for dental work but unable to afford treatment. Currently w/ gum pain for several weeks around the cap w/ occasional purulent discharge. Getting worse. No jaw pain, fevers, CP, SOb.    The following portions of the patient's history were reviewed and updated as appropriate: allergies, current medications, past medical history, family and social history, and problem list.  Patient is a nonsmoker   Past Medical History  Diagnosis Date  . Umbilical hernia   . Obesity   . Hypertension 08-10-11    tx. meds  . Shortness of breath 08-10-11    with increased activity only  . Diabetes mellitus 08-10-11    tx. oral meds.  . Kidney stones 08-10-11    once in past, none recent  . Neuromuscular disorder 08-10-11    "burning/tingling sensation In feet"  . Arthritis 08-10-11    1 month ago-ankles/feet  . Depression 08-10-11    depression(due to childhood issues)  . Chronic cough 08-10-11    more than 6 yrs from sinus drainage  . Methylprednisolone-induced hypoxia 08/23/2011  . OSA (obstructive sleep apnea) 08/23/2011    ROS as above otherwise neg.    Medications reviewed. Current Outpatient Prescriptions  Medication Sig Dispense Refill  . albuterol (PROVENTIL HFA;VENTOLIN HFA) 108 (90 BASE) MCG/ACT inhaler Inhale 2 puffs into the lungs every 6 (six) hours as needed for wheezing.  2 Inhaler  4  . amLODipine (NORVASC) 10 MG tablet Take 1 tablet (10 mg total) by mouth daily before breakfast.  30 tablet  0  . aspirin 325 MG tablet Take 325 mg by mouth daily.      . chlorhexidine (PERIDEX) 0.12 % solution 15 mLs by Mouth  Rinse route 2 (two) times daily.  120 mL  0  . clindamycin (CLEOCIN) 300 MG capsule Take 1 capsule (300 mg total) by mouth 3 (three) times daily.  30 capsule  0  . desloratadine (CLARINEX) 5 MG tablet Take 5 mg by mouth daily.        . fluticasone (FLONASE) 50 MCG/ACT nasal spray Place 2 sprays into the nose daily as needed. Allergies  16 g  3  . glipiZIDE (GLUCOTROL) 10 MG tablet Take 1 tablet (10 mg total) by mouth 2 (two) times daily.  180 tablet  3  . glucose blood test strip Use as instructed  100 each  12  . metFORMIN (GLUCOPHAGE) 1000 MG tablet Take 1 tablet (1,000 mg total) by mouth 2 (two) times daily with a meal.  180 tablet  3  . metoprolol succinate (TOPROL XL) 100 MG 24 hr tablet 1.5 tablets daily. Take with or immediately following a meal.  135 tablet  2  . quinapril (ACCUPRIL) 40 MG tablet Take 1 tablet (40 mg total) by mouth at bedtime.  90 tablet  3  . rosuvastatin (CRESTOR) 20 MG tablet Take 1 tablet (20 mg total) by mouth daily.  90 tablet  3   No current facility-administered medications for this visit.    Exam:  BP 145/91  Pulse 92  Temp(Src) 98.4 F (36.9 C) (Oral)  Wt 284 lb (128.822 kg) Gen: Well NAD HEENT: EOMI,  MMM, L mandibular first molar w/ crown. Surounding gingival swelling and tenderness to palpation w/o obvious discharge.  Skin: fleshy mole on underside of nose of benign appearance. Multiple SK of the forehead w/ other inflammed AK of the temple and nose.    No results found for this or any previous visit (from the past 72 hour(s)).  A/P (as seen in Problem list)  HYPERLIPIDEMIA Start crestor  HYPERTENSION, BENIGN SYSTEMIC Elevated today likely from anxiety over surgical procedure  Dental infection Recurring issue for pt.  Pt in need of dental work and is trying to obtain such Clinda and chlorhexidine  Skin lesion Numerous skin lesions including SK, AK, and fleshy mole of the nose  Written consent obtained. Hyfrecator used to dessicate two  lesions of the forehead on R and L side (SK) and liquid nitrogen used for several inflammed AK spots on L nasal bridge and L temple as well as fleshy mole removed from underside of nose by 15 blade scalple and then hyfrecator. Pt tolerated procedure well.

## 2013-06-19 NOTE — Assessment & Plan Note (Signed)
Elevated today likely from anxiety over surgical procedure

## 2013-06-19 NOTE — Patient Instructions (Signed)
You did very well today The spots that were treated today should be completely gone or at least reduced Please come back to see me in 2 months Please start your antibiotic for the dental infection Please use the mouthwash as needed in the future to help prevent infection Please use vitamin E on your scars Please call or come in sooner if needed.

## 2013-06-19 NOTE — Assessment & Plan Note (Signed)
Recurring issue for pt.  Pt in need of dental work and is trying to obtain such Clinda and chlorhexidine

## 2013-06-19 NOTE — Assessment & Plan Note (Signed)
Start crestor 

## 2013-06-19 NOTE — Assessment & Plan Note (Signed)
Numerous skin lesions including SK, AK, and fleshy mole of the nose  Written consent obtained. Hyfrecator used to dessicate two lesions of the forehead on R and L side (SK) and liquid nitrogen used for several inflammed AK spots on L nasal bridge and L temple as well as fleshy mole removed from underside of nose by 15 blade scalple and then hyfrecator. Pt tolerated procedure well.

## 2013-07-02 ENCOUNTER — Telehealth: Payer: Self-pay | Admitting: *Deleted

## 2013-07-02 NOTE — Telephone Encounter (Signed)
Rx refill request for Protonix 40 mg from [facility]. [facility] office.  Last dispensed 01/07/2013.  Medication is not listed on current med list. Derl Barrow, RN

## 2013-07-04 MED ORDER — PANTOPRAZOLE SODIUM 40 MG PO TBEC: 40.0000 mg | DELAYED_RELEASE_TABLET | Freq: Every day | ORAL | Status: DC | PRN

## 2013-07-04 NOTE — Telephone Encounter (Signed)
OK by me. Please call pt and instruct him to only take daily as needed for symptoms Rx is up front for him to pick up. If he's unable to pick this up then please call in to the HD.  Also please ask how his nose and other skin areas are doing after his procedure last week.   Linna Darner, MD Family Medicine PGY-3 07/04/2013, 1:51 PM

## 2013-07-05 ENCOUNTER — Telehealth: Payer: Self-pay | Admitting: Family Medicine

## 2013-07-05 NOTE — Telephone Encounter (Signed)
Pt called today and was read the message that Dr. Marily Memos left. He said he does not need the Protonix and did not request that the HD refill it. He also said that he is doing great and has not had any problems. jw

## 2013-07-05 NOTE — Telephone Encounter (Signed)
LM for patient to call back.  Please give him message from Dr. Marily Memos when he calls back. Jihan Mellette,CMA

## 2013-07-05 NOTE — Telephone Encounter (Signed)
Will forward to MD. Yocelin Vanlue,CMA  

## 2013-07-19 ENCOUNTER — Other Ambulatory Visit: Payer: Self-pay | Admitting: *Deleted

## 2013-07-22 ENCOUNTER — Telehealth: Payer: Self-pay | Admitting: *Deleted

## 2013-07-22 MED ORDER — ALBUTEROL SULFATE HFA 108 (90 BASE) MCG/ACT IN AERS: 2.0000 | INHALATION_SPRAY | Freq: Four times a day (QID) | RESPIRATORY_TRACT | Status: DC | PRN

## 2013-07-22 MED ORDER — PANTOPRAZOLE SODIUM 40 MG PO TBEC: 40.0000 mg | DELAYED_RELEASE_TABLET | Freq: Every day | ORAL | Status: DC | PRN

## 2013-07-22 MED ORDER — QUINAPRIL HCL 40 MG PO TABS: 40.0000 mg | ORAL_TABLET | Freq: Every day | ORAL | Status: DC

## 2013-07-23 MED ORDER — DESLORATADINE 5 MG PO TABS: 5.0000 mg | ORAL_TABLET | Freq: Every day | ORAL | Status: DC

## 2013-07-23 MED ORDER — ALBUTEROL SULFATE HFA 108 (90 BASE) MCG/ACT IN AERS: 2.0000 | INHALATION_SPRAY | Freq: Four times a day (QID) | RESPIRATORY_TRACT | Status: DC | PRN

## 2013-07-23 NOTE — Addendum Note (Signed)
Addended by: Marily Memos, DAVID J on: 07/23/2013 12:23 PM   Modules accepted: Orders

## 2013-07-23 NOTE — Telephone Encounter (Signed)
Prescriptions faxed to [facility]. Jazmin Hartsell,CMA

## 2013-07-30 MED ORDER — ALBUTEROL SULFATE HFA 108 (90 BASE) MCG/ACT IN AERS: 2.0000 | INHALATION_SPRAY | Freq: Four times a day (QID) | RESPIRATORY_TRACT | Status: DC | PRN

## 2013-07-30 MED ORDER — DESLORATADINE 5 MG PO TABS: 5.0000 mg | ORAL_TABLET | Freq: Every day | ORAL | Status: DC

## 2013-07-30 NOTE — Addendum Note (Signed)
Addended byMarily Memos, Rayan Dyal J on: 07/30/2013 01:00 PM   Modules accepted: Orders

## 2013-08-23 ENCOUNTER — Telehealth: Payer: Self-pay | Admitting: *Deleted

## 2013-08-23 ENCOUNTER — Encounter: Payer: Self-pay | Admitting: Family Medicine

## 2013-08-23 ENCOUNTER — Ambulatory Visit (INDEPENDENT_AMBULATORY_CARE_PROVIDER_SITE_OTHER): Payer: No Typology Code available for payment source | Admitting: Family Medicine

## 2013-08-23 VITALS — BP 150/98 | HR 90 | Temp 98.3°F | Wt 280.0 lb

## 2013-08-23 DIAGNOSIS — N529 Male erectile dysfunction, unspecified: Secondary | ICD-10-CM | POA: Insufficient documentation

## 2013-08-23 DIAGNOSIS — I10 Essential (primary) hypertension: Secondary | ICD-10-CM

## 2013-08-23 DIAGNOSIS — E1165 Type 2 diabetes mellitus with hyperglycemia: Principal | ICD-10-CM

## 2013-08-23 DIAGNOSIS — L989 Disorder of the skin and subcutaneous tissue, unspecified: Secondary | ICD-10-CM

## 2013-08-23 DIAGNOSIS — IMO0001 Reserved for inherently not codable concepts without codable children: Secondary | ICD-10-CM

## 2013-08-23 HISTORY — DX: Male erectile dysfunction, unspecified: N52.9

## 2013-08-23 LAB — POCT GLYCOSYLATED HEMOGLOBIN (HGB A1C): Hemoglobin A1C: 8.2

## 2013-08-23 MED ORDER — SILDENAFIL CITRATE 25 MG PO TABS: 25.0000 mg | ORAL_TABLET | Freq: Every day | ORAL | Status: DC | PRN

## 2013-08-23 NOTE — Assessment & Plan Note (Signed)
Above goal today. Nervous  About surgical procedure Recheck at next appt  No change today

## 2013-08-23 NOTE — Addendum Note (Signed)
Addended by: Waldemar Dickens on: 08/23/2013 01:38 PM   Modules accepted: Orders

## 2013-08-23 NOTE — Assessment & Plan Note (Signed)
a1c today

## 2013-08-23 NOTE — Addendum Note (Signed)
Addended by: Waldemar Dickens on: 08/23/2013 01:36 PM   Modules accepted: Orders

## 2013-08-23 NOTE — Patient Instructions (Signed)
You are doing well. Please let me know if you develop any pain or swelling at the areas where we cut off the moles. Please come back to see me in 4 weeks for a check up.

## 2013-08-23 NOTE — Addendum Note (Signed)
Addended by: [place], Taniaya Rudder on: 08/23/2013 12:57 PM   Modules accepted: Orders

## 2013-08-23 NOTE — Telephone Encounter (Signed)
Left voice message for pt to call nurse back regarding his medication.  If pt calls back please ask what pharmacy he would like his Rx for Viagra 25 mg tablet be call/faxed to. [facility] does not dipsense or order ED medications.  Derl Barrow, RN

## 2013-08-23 NOTE — Assessment & Plan Note (Signed)
Discussed multifactorial nature of this problem Chonric condition for pt Pt to focus on losing wt. Controlling DM, and controlling BP Start Viagra. Precautions given

## 2013-08-23 NOTE — Assessment & Plan Note (Signed)
Skin lesions consistent w/ benign moles but causing irritation and inflammation from shaving and in the glabellar fold on the L causing pressure and irritation. Removal today  Previous inflammed SK lesions of temples resolved Mole at bottom of nose removed and well appearing scar.    L nasal inflammed AK still present but smaller on L nasal bridge. Liquid nitrogen used to apply several freezing treatments. precuations given and f/u in 4 wks R cheek and L glabellar lesions removed today using 15 blade scalpel and electrocautery. Signed consent in the chart. Local skin anesthetised w/ 1% lidocaine w/ Epi. pt tolerated procedure well. Careful wound instructions given. Antibiotic ointment and bandaids applied.  No specimen sent for path.

## 2013-08-23 NOTE — Progress Notes (Signed)
Jacob [name] is a 58 y.o. male who presents to [facility] today for DM and moles  DM: not checking sugar at home. Taking metformin 1000 BID and glipizide 10mg  BID. Wt down 4lbs.   HTN: taking norvasc, ACEi and metop. No CP, Sob, palp  Moles: previous excision w/ mild scar but remains flat. Other moles on face cut frequently by shaving and irritated. Mole on nasal fold at glabella is ttp from time to time and groing in size slowly.   ED: ongoing for years. Has not tried anything. Does not smoke  The following portions of the patient's history were reviewed and updated as appropriate: allergies, current medications, past medical history, family and social history, and problem list.  Patient is a nonsmoker.  Past Medical History  Diagnosis Date  . Umbilical hernia   . Obesity   . Hypertension 08-10-11    tx. meds  . Shortness of breath 08-10-11    with increased activity only  . Diabetes mellitus 08-10-11    tx. oral meds.  . Kidney stones 08-10-11    once in past, none recent  . Neuromuscular disorder 08-10-11    "burning/tingling sensation In feet"  . Arthritis 08-10-11    1 month ago-ankles/feet  . Depression 08-10-11    depression(due to childhood issues)  . Chronic cough 08-10-11    more than 6 yrs from sinus drainage  . Methylprednisolone-induced hypoxia 08/23/2011  . OSA (obstructive sleep apnea) 08/23/2011    ROS as above otherwise neg.    Medications reviewed. Current Outpatient Prescriptions  Medication Sig Dispense Refill  . desloratadine (CLARINEX) 5 MG tablet Take 1 tablet (5 mg total) by mouth daily.  30 tablet  6  . glipiZIDE (GLUCOTROL) 10 MG tablet Take 1 tablet (10 mg total) by mouth 2 (two) times daily.  180 tablet  3  . metFORMIN (GLUCOPHAGE) 1000 MG tablet Take 1 tablet (1,000 mg total) by mouth 2 (two) times daily with a meal.  180 tablet  3  . metoprolol succinate (TOPROL XL) 100 MG 24 hr tablet 1.5 tablets daily. Take with or immediately following a meal.  135  tablet  2  . pantoprazole (PROTONIX) 40 MG tablet Take 1 tablet (40 mg total) by mouth daily as needed (heart burn).  30 tablet  3  . quinapril (ACCUPRIL) 40 MG tablet Take 1 tablet (40 mg total) by mouth at bedtime.  90 tablet  3  . rosuvastatin (CRESTOR) 20 MG tablet Take 1 tablet (20 mg total) by mouth daily.  90 tablet  3  . albuterol (PROVENTIL HFA;VENTOLIN HFA) 108 (90 BASE) MCG/ACT inhaler Inhale 2 puffs into the lungs every 6 (six) hours as needed for wheezing.  2 Inhaler  4  . amLODipine (NORVASC) 10 MG tablet Take 1 tablet (10 mg total) by mouth daily before breakfast.  30 tablet  0  . aspirin 325 MG tablet Take 325 mg by mouth daily.      . chlorhexidine (PERIDEX) 0.12 % solution 15 mLs by Mouth Rinse route 2 (two) times daily.  120 mL  0  . fluticasone (FLONASE) 50 MCG/ACT nasal spray Place 2 sprays into the nose daily as needed. Allergies  16 g  3  . glucose blood test strip Use as instructed  100 each  12  . sildenafil (VIAGRA) 25 MG tablet Take 1-2 tablets (25-50 mg total) by mouth daily as needed for erectile dysfunction.  20 tablet  0   No current facility-administered medications for  this visit.    Exam:  BP 150/98  Pulse 90  Temp(Src) 98.3 F (36.8 C) (Oral)  Wt 280 lb (127.007 kg) Gen: Well NAD HEENT: EOMI,  MMM   No results found for this or any previous visit (from the past 72 hour(s)).  A/P (as seen in Problem list)  HYPERTENSION, BENIGN SYSTEMIC Above goal today. Nervous  About surgical procedure Recheck at next appt  No change today  DIABETES MELLITUS, TYPE II, UNCONTROLLED a1c today  Skin lesion Skin lesions consistent w/ benign moles but causing irritation and inflammation from shaving and in the glabellar fold on the L causing pressure and irritation. Removal today  Previous inflammed SK lesions of temples resolved Mole at bottom of nose removed and well appearing scar.    L nasal inflammed AK still present but smaller on L nasal bridge. Liquid  nitrogen used to apply several freezing treatments. precuations given and f/u in 4 wks R cheek and L glabellar lesions removed today using 15 blade scalpel and electrocautery. Signed consent in the chart. Local skin anesthetised w/ 1% lidocaine w/ Epi. pt tolerated procedure well. Careful wound instructions given. Antibiotic ointment and bandaids applied.  No specimen sent for path.   Erectile dysfunction Discussed multifactorial nature of this problem Chonric condition for pt Pt to focus on losing wt. Controlling DM, and controlling BP Start Viagra. Precautions given

## 2013-08-28 NOTE — Telephone Encounter (Signed)
LM for patient to call back.  Still waiting to find out which pharmacy he needs [place] to call this into.  Please ask when he calls back.  Thanks [employer]

## 2013-08-28 NOTE — Telephone Encounter (Signed)
Team, can you all call this in. Thx

## 2013-10-09 ENCOUNTER — Ambulatory Visit: Payer: No Typology Code available for payment source

## 2013-10-10 ENCOUNTER — Other Ambulatory Visit: Payer: Self-pay | Admitting: *Deleted

## 2013-10-10 NOTE — Telephone Encounter (Signed)
Requesting 90 day supply.

## 2013-10-11 MED ORDER — AMLODIPINE BESYLATE 10 MG PO TABS: 10.0000 mg | ORAL_TABLET | Freq: Every day | ORAL | Status: DC

## 2013-10-17 ENCOUNTER — Telehealth: Payer: Self-pay | Admitting: *Deleted

## 2013-10-17 NOTE — Telephone Encounter (Signed)
Received fax from [facility]. HD MAP stating they can no longer get Protonix at no charge. Would you consider changing to Nexium?  Nexium will be provided at no cost to patient.  If change is appropriate  please fax no Rx.  Derl Barrow, RN

## 2013-10-18 NOTE — Telephone Encounter (Signed)
OK to make change.  Please call HD  Nexium 20mg  Qday #30 Refills 6

## 2013-11-26 ENCOUNTER — Telehealth: Payer: Self-pay | Admitting: *Deleted

## 2013-11-26 NOTE — Telephone Encounter (Signed)
Received fax from [place] stating that Norvasc in no longer available for free.  Pt is taking Crestor; would provider consider writing a new Rx for Caduet 10/10 to replace Norvasc and Crestor.  Please call MAP with questions at [phone].  Derl Barrow, RN

## 2013-11-26 NOTE — Telephone Encounter (Signed)
Also Toprol is no longer free-consider writing for Bystolic? Hassell Done, Rosine Beat, RN

## 2013-11-27 MED ORDER — AMLODIPINE-ATORVASTATIN 10-10 MG PO TABS: 1.0000 | ORAL_TABLET | Freq: Every day | ORAL | Status: AC

## 2013-11-27 MED ORDER — NEBIVOLOL HCL 10 MG PO TABS: 10.0000 mg | ORAL_TABLET | Freq: Every day | ORAL | Status: DC

## 2013-11-27 NOTE — Telephone Encounter (Addendum)
Will start Caduet 88/67 and Bystolic - 10mg  qd for now. Pt. Should discontinue Crestor and Toprol XL. Have the patient come to clinic for follow up on medications and blood pressure evaluation in two months. Thanks!

## 2013-11-27 NOTE — Addendum Note (Signed)
Addended by: Aquilla Hacker on: 11/27/2013 12:38 PM   Modules accepted: Orders

## 2013-11-29 MED ORDER — ESOMEPRAZOLE MAGNESIUM 40 MG PO CPDR: 40.0000 mg | DELAYED_RELEASE_CAPSULE | Freq: Every day | ORAL | Status: DC

## 2013-11-29 MED ORDER — OLMESARTAN MEDOXOMIL 40 MG PO TABS: 40.0000 mg | ORAL_TABLET | Freq: Every day | ORAL | Status: DC

## 2013-11-29 NOTE — Addendum Note (Signed)
Addended by: Aquilla Hacker on: 11/29/2013 05:17 PM   Modules accepted: Orders, Medications

## 2013-12-26 ENCOUNTER — Telehealth: Payer: Self-pay | Admitting: *Deleted

## 2013-12-26 MED ORDER — OLMESARTAN MEDOXOMIL 40 MG PO TABS: 40.0000 mg | ORAL_TABLET | Freq: Every day | ORAL | Status: DC

## 2013-12-26 MED ORDER — ESOMEPRAZOLE MAGNESIUM 40 MG PO CPDR: 40.0000 mg | DELAYED_RELEASE_CAPSULE | Freq: Every day | ORAL | Status: DC

## 2013-12-26 NOTE — Telephone Encounter (Signed)
Refilled, signed, and placed on your desk. Thanks Tamika!   [employer]

## 2013-12-26 NOTE — Telephone Encounter (Signed)
Received fax from [place] stating Rx for Benicar and Nexium was not signed by provider. Please Print and sign new Rx for medications.  Derl Barrow, RN

## 2014-01-15 ENCOUNTER — Telehealth: Payer: Self-pay | Admitting: Family Medicine

## 2014-01-15 NOTE — Telephone Encounter (Signed)
LM for the map program to call back regarding alternatives for this medication.  Will forward to MD to see if he is aware of what is available through them. Kameria Canizares,CMA

## 2014-01-15 NOTE — Telephone Encounter (Signed)
Pt gets him medication through the [facility] program and is having problems getting Norvasc and Toprol XL. They have a list and they need the doctor to pick something off the list that is free that would be similar to those medications and call them in. Please call him if you have any questions [phone]. jw

## 2014-03-11 ENCOUNTER — Encounter: Payer: Self-pay | Admitting: Family Medicine

## 2014-03-11 ENCOUNTER — Ambulatory Visit (INDEPENDENT_AMBULATORY_CARE_PROVIDER_SITE_OTHER): Payer: No Typology Code available for payment source | Admitting: *Deleted

## 2014-03-11 ENCOUNTER — Ambulatory Visit (INDEPENDENT_AMBULATORY_CARE_PROVIDER_SITE_OTHER): Payer: No Typology Code available for payment source | Admitting: Family Medicine

## 2014-03-11 VITALS — BP 191/111 | HR 102 | Temp 98.1°F | Ht 69.0 in | Wt 275.7 lb

## 2014-03-11 DIAGNOSIS — M791 Myalgia, unspecified site: Secondary | ICD-10-CM

## 2014-03-11 DIAGNOSIS — I10 Essential (primary) hypertension: Secondary | ICD-10-CM

## 2014-03-11 DIAGNOSIS — E11630 Type 2 diabetes mellitus with periodontal disease: Secondary | ICD-10-CM

## 2014-03-11 DIAGNOSIS — R0600 Dyspnea, unspecified: Secondary | ICD-10-CM

## 2014-03-11 DIAGNOSIS — Z23 Encounter for immunization: Secondary | ICD-10-CM

## 2014-03-11 DIAGNOSIS — IMO0001 Reserved for inherently not codable concepts without codable children: Secondary | ICD-10-CM

## 2014-03-11 DIAGNOSIS — E782 Mixed hyperlipidemia: Secondary | ICD-10-CM

## 2014-03-11 DIAGNOSIS — M609 Myositis, unspecified: Secondary | ICD-10-CM

## 2014-03-11 LAB — CBC WITH DIFFERENTIAL/PLATELET
BASOS PCT: 0 % (ref 0–1)
Basophils Absolute: 0 10*3/uL (ref 0.0–0.1)
Eosinophils Absolute: 0.2 10*3/uL (ref 0.0–0.7)
Eosinophils Relative: 2 % (ref 0–5)
HCT: 43 % (ref 39.0–52.0)
Hemoglobin: 14.8 g/dL (ref 13.0–17.0)
Lymphocytes Relative: 21 % (ref 12–46)
Lymphs Abs: 2.2 10*3/uL (ref 0.7–4.0)
MCH: 27.7 pg (ref 26.0–34.0)
MCHC: 34.4 g/dL (ref 30.0–36.0)
MCV: 80.5 fL (ref 78.0–100.0)
Monocytes Absolute: 0.6 10*3/uL (ref 0.1–1.0)
Monocytes Relative: 6 % (ref 3–12)
NEUTROS PCT: 71 % (ref 43–77)
Neutro Abs: 7.3 10*3/uL (ref 1.7–7.7)
PLATELETS: 165 10*3/uL (ref 150–400)
RBC: 5.34 MIL/uL (ref 4.22–5.81)
RDW: 15 % (ref 11.5–15.5)
WBC: 10.3 10*3/uL (ref 4.0–10.5)

## 2014-03-11 LAB — BASIC METABOLIC PANEL
BUN: 12 mg/dL (ref 6–23)
CALCIUM: 8.7 mg/dL (ref 8.4–10.5)
CO2: 29 mEq/L (ref 19–32)
Chloride: 101 mEq/L (ref 96–112)
Creat: 0.93 mg/dL (ref 0.50–1.35)
Glucose, Bld: 183 mg/dL — ABNORMAL HIGH (ref 70–99)
Potassium: 4.1 mEq/L (ref 3.5–5.3)
SODIUM: 139 meq/L (ref 135–145)

## 2014-03-11 LAB — CK: Total CK: 81 U/L (ref 7–232)

## 2014-03-11 LAB — POCT SEDIMENTATION RATE: POCT SED RATE: 18 mm/h (ref 0–22)

## 2014-03-11 LAB — C-REACTIVE PROTEIN: CRP: 1.1 mg/dL — AB (ref <0.60)

## 2014-03-11 LAB — POCT GLYCOSYLATED HEMOGLOBIN (HGB A1C): HEMOGLOBIN A1C: 9.6

## 2014-03-11 NOTE — Patient Instructions (Signed)
Thanks for coming in today.   As we discussed, make an appointment to see your ophthalmologist.   I will see you back in about 1 week in order to follow up on your laboratory evaluation and continue to check your blood pressure.   I will call you with the results of your lab work.   We will be starting gabapentin, and metoprolol today. You can pick them up at your pharmacy. If you have any issues with these medications please call the clinic.  If you experience any acute onset chest pain, shortness of breath that won't go away, arm pain, jaw pain, or changes in vision, then please go to the ER to be evaluated as you may be having a heart attack.   Thanks for letting us take care of you.   Sincerely,  Paula Compton, MD  Family Medicine - PGY 1

## 2014-03-19 ENCOUNTER — Encounter: Payer: Self-pay | Admitting: Family Medicine

## 2014-03-19 DIAGNOSIS — M791 Myalgia, unspecified site: Secondary | ICD-10-CM | POA: Insufficient documentation

## 2014-03-19 NOTE — Assessment & Plan Note (Signed)
A: Pt. With poorly controlled DMII, not recording blood glucose at home. On glipizide and metformin. Not attempting diet and exercise at this time.   P:  - Continue metformin and glipizide for now.  - Check A1C and POCT Glucose today.  - Will adjust therapy at follow up visit for HTN if needed.  - No vision changes.  - Instructed to get opthalmology exam for retinal screening.

## 2014-03-19 NOTE — Progress Notes (Signed)
Patient ID: Jacob [name], male   DOB: Aug 28, 1955, 58 y.o.   MRN: [id number]   [facility] Aquilla Hacker, MD Phone: [phone]  Subjective:   # HTN  - Pt. With uncontrolled HTN at this point. [name] taking norvasc only and has BP of 191 /111 with pulse of 102. He denies SOB, palpitations, or vision changes at this time. He denies Chest Pain as well.  - Reports compliance with norvasc.   # DMII   - Pt. With glucose of 183 and A1C of 9.6 at this office visit. A1C is elevated from previous.  - [name] currently taking Glipizide and Metformin daily.  - He reports polyuria / polydypsia.  - He denies polyphagia.   # Myalgias   - Pt. With muscle aches / pains.  - He reports some weakness in the morning when trying to dress himself and lifting his arms above his head.  - He does not endorse vision changes, jaw claudication, headache, back pain, or dysuria.  - He reports pain specifically in his arms and proximal legs.  - He does report fatigue, but he denies weight loss.   All relevant systems were reviewed and were negative unless otherwise noted in the HPI  Past Medical History Reviewed problem list.  Medications- reviewed and updated Current Outpatient Prescriptions  Medication Sig Dispense Refill  . albuterol (PROVENTIL HFA;VENTOLIN HFA) 108 (90 BASE) MCG/ACT inhaler Inhale 2 puffs into the lungs every 6 (six) hours as needed for wheezing. 2 Inhaler 4  . amLODipine (NORVASC) 10 MG tablet Take 1 tablet (10 mg total) by mouth daily before breakfast. 30 tablet 0  . amlodipine-atorvastatin (CADUET) 10-10 MG per tablet Take 1 tablet by mouth daily. 30 tablet 2  . aspirin 325 MG tablet Take 325 mg by mouth daily.    . chlorhexidine (PERIDEX) 0.12 % solution 15 mLs by Mouth Rinse route 2 (two) times daily. 120 mL 0  . desloratadine (CLARINEX) 5 MG tablet Take 1 tablet (5 mg total) by mouth daily. 30 tablet 6  . esomeprazole (NEXIUM) 40 MG capsule Take 1 capsule (40  mg total) by mouth daily. 30 capsule 3  . fluticasone (FLONASE) 50 MCG/ACT nasal spray Place 2 sprays into the nose daily as needed. Allergies 16 g 3  . glipiZIDE (GLUCOTROL) 10 MG tablet Take 1 tablet (10 mg total) by mouth 2 (two) times daily. 180 tablet 3  . glucose blood test strip Use as instructed 100 each 12  . metFORMIN (GLUCOPHAGE) 1000 MG tablet Take 1 tablet (1,000 mg total) by mouth 2 (two) times daily with a meal. 180 tablet 3  . metoprolol succinate (TOPROL XL) 100 MG 24 hr tablet 1.5 tablets daily. Take with or immediately following a meal. 135 tablet 2  . nebivolol (BYSTOLIC) 10 MG tablet Take 1 tablet (10 mg total) by mouth daily. 30 tablet 2  . olmesartan (BENICAR) 40 MG tablet Take 1 tablet (40 mg total) by mouth daily. 30 tablet 3  . rosuvastatin (CRESTOR) 20 MG tablet Take 1 tablet (20 mg total) by mouth daily. 90 tablet 3  . sildenafil (VIAGRA) 25 MG tablet Take 1-2 tablets (25-50 mg total) by mouth daily as needed for erectile dysfunction. 20 tablet 0   No current facility-administered medications for this visit.   Chief complaint-noted No additions to family history Social history- patient is a non smoker  Objective: BP 191/111 mmHg  Pulse 102  Temp(Src) 98.1 F (36.7 C) (Oral)  Ht 5\' 9"  (1.753 m)  Wt 275 lb 11.2 oz (125.057 kg)  BMI 40.70 kg/m2 Gen: NAD, alert, cooperative with exam HEENT: NCAT, EOMI, PERRL, No LAD, No TTP over temples, posterior cranium, neck, jaw, or sinuses. No thyromegaly noted.  Neck: FROM, supple, no supraclavicular LAD CV: RRR, good S1/S2, no murmur, No Gallop, No rub, PMI appropriate, 2+ distal pulses.  Resp: CTABL, no wheezes, non-labored Abd: SNTND, BS present, no guarding or organomegaly Ext: No edema, warm, normal tone, moves UE/LE spontaneously, Nontender to palpation of all major muscle groups.  Neuro: Alert and oriented, No gross deficits, ambulating normally.  Skin: no rashes no lesions  Assessment/Plan: See problem based  a/p

## 2014-03-19 NOTE — Assessment & Plan Note (Signed)
A: Pt. With elevated BP 191 / 117 today. Only taking norvasc at home. Asymptomatic at this time.   P:  - CMET - Added Propranolol given pulse of 102 - Will recheck BP in 1 week.  - CBC, lipid profile

## 2014-03-19 NOTE — Assessment & Plan Note (Signed)
A: Pt. Complaining of weakness, muscle tenderness, and difficulty dressing himself in the morning. He denies jaw tenderness, pain, or vision changes. He says that this all started 2 weeks ago. Symptoms are global, but worse when raising arms above head, and in proximal legs. Also pt. Is on rosuvastatin.   P:  - check ESR, CRP  - CMET, CBC - Lipid profile.  - Will start workup here and discussed symptoms such as vision changes or headaches for which he should go to the ED as this might be an emergency.  - Will follow up in 1 week with improvement.  - Advil prn for now.

## 2014-03-19 NOTE — Assessment & Plan Note (Signed)
A: Pt. With hx of HLD. Currently on Rosuvastatin. Experiencing some myalgias.   P:  - Check lipid panel.  - Will consider changing Rosuvastatin after exploring other possible causes of his myalgias first.

## 2014-03-25 ENCOUNTER — Encounter: Payer: Self-pay | Admitting: Family Medicine

## 2014-04-15 ENCOUNTER — Other Ambulatory Visit: Payer: Self-pay | Admitting: *Deleted

## 2014-04-16 MED ORDER — ESOMEPRAZOLE MAGNESIUM 40 MG PO CPDR
40.0000 mg | DELAYED_RELEASE_CAPSULE | Freq: Every day | ORAL | Status: DC
Start: 2014-04-16 — End: 2014-05-14

## 2014-05-12 ENCOUNTER — Ambulatory Visit: Payer: Self-pay

## 2014-05-14 ENCOUNTER — Other Ambulatory Visit: Payer: Self-pay | Admitting: *Deleted

## 2014-05-14 MED ORDER — OLMESARTAN MEDOXOMIL 40 MG PO TABS: 40.0000 mg | ORAL_TABLET | Freq: Every day | ORAL | Status: DC

## 2014-05-14 MED ORDER — ESOMEPRAZOLE MAGNESIUM 40 MG PO CPDR: 40.0000 mg | DELAYED_RELEASE_CAPSULE | Freq: Every day | ORAL | Status: DC

## 2014-06-03 ENCOUNTER — Other Ambulatory Visit: Payer: Self-pay | Admitting: *Deleted

## 2014-06-04 ENCOUNTER — Other Ambulatory Visit: Payer: Self-pay | Admitting: *Deleted

## 2014-06-04 DIAGNOSIS — E782 Mixed hyperlipidemia: Secondary | ICD-10-CM

## 2014-06-04 MED ORDER — ALBUTEROL SULFATE HFA 108 (90 BASE) MCG/ACT IN AERS: 2.0000 | INHALATION_SPRAY | Freq: Four times a day (QID) | RESPIRATORY_TRACT | Status: DC | PRN

## 2014-06-05 MED ORDER — ROSUVASTATIN CALCIUM 20 MG PO TABS: 20.0000 mg | ORAL_TABLET | Freq: Every day | ORAL | Status: DC

## 2014-06-05 MED ORDER — DESLORATADINE 5 MG PO TABS: 5.0000 mg | ORAL_TABLET | Freq: Every day | ORAL | Status: DC

## 2014-06-12 ENCOUNTER — Telehealth: Payer: Self-pay | Admitting: *Deleted

## 2014-06-12 NOTE — Telephone Encounter (Signed)
Received refill request from [facility] for Colycrys 0.6 mg #90 tabs--Take 1 po every 1-2 hours prn gout.  Not listed on Meds & Orders.  Will route request to PCP.  Burna Forts, BSN, RN-BC

## 2014-06-19 ENCOUNTER — Telehealth: Payer: Self-pay | Admitting: *Deleted

## 2014-06-19 ENCOUNTER — Other Ambulatory Visit: Payer: Self-pay | Admitting: *Deleted

## 2014-06-19 DIAGNOSIS — E782 Mixed hyperlipidemia: Secondary | ICD-10-CM

## 2014-06-19 MED ORDER — ROSUVASTATIN CALCIUM 20 MG PO TABS: 20.0000 mg | ORAL_TABLET | Freq: Every day | ORAL | Status: DC

## 2014-06-19 NOTE — Telephone Encounter (Signed)
When refilling medications. Please send electronically using the fax tab.  Electronic Faxes are accepted from MAP. Derl Barrow, RN

## 2014-06-19 NOTE — Telephone Encounter (Signed)
1st Rx was printed instead of sent electronically, will resend now via Fax. Talyn Eddie, Salome Spotted

## 2014-06-20 MED ORDER — ESOMEPRAZOLE MAGNESIUM 40 MG PO CPDR: 40.0000 mg | DELAYED_RELEASE_CAPSULE | Freq: Every day | ORAL | Status: DC

## 2014-06-20 MED ORDER — COLCHICINE 0.6 MG PO TABS: 0.6000 mg | ORAL_TABLET | Freq: Every day | ORAL | Status: DC

## 2014-06-20 MED ORDER — OLMESARTAN MEDOXOMIL 40 MG PO TABS: 40.0000 mg | ORAL_TABLET | Freq: Every day | ORAL | Status: DC

## 2014-07-23 ENCOUNTER — Telehealth: Payer: Self-pay | Admitting: Family Medicine

## 2014-07-23 NOTE — Telephone Encounter (Signed)
Will forward to MD to see what he suggests.  We might need to discuss this with social work and see what options she has.   Jazmin Hartsell,CMA

## 2014-07-23 NOTE — Telephone Encounter (Signed)
Pt called and said that [facility] has honored his [employer] beyond their limits and advised him to call us and discuss what other options he could pursue / Fonda Kinder, ASA

## 2014-07-24 NOTE — Telephone Encounter (Signed)
Will forward this to [place]. I'm not sure what opthalmology options we have. As I recall, they have become more limited as of the end of last year. Constance Holster, is this something you could help [place] with? Thanks guys.   CGM

## 2014-07-30 NOTE — Telephone Encounter (Signed)
Constance Holster,   Thanks, let's get him in through the [facility] for a one time exam, and then next year for his vision follow up we will see if we can figure something out as well. Thanks for your help.   CGM

## 2014-07-30 NOTE — Telephone Encounter (Signed)
What exactly does the pt need done? I can see if I can send his referral to an agency through the [facility] where he can have a vision exam and possibly receive glasses if need be. He would be responsible to pay $25, these would not be ongoing appointments just a one time vision exam. Would this be helpful?  Hunt Oris, MSW, [place]

## 2014-07-31 ENCOUNTER — Encounter: Payer: Self-pay | Admitting: Clinical

## 2014-07-31 NOTE — Telephone Encounter (Signed)
CSW has sent a referral for a vision exam to [name] with [employer] who will send it to the [facility]. Hunt Oris, MSW, [place]

## 2014-07-31 NOTE — Progress Notes (Signed)
CSW informed by Rozann Lesches with [employer] that the waiting list for the vision exam is a few months out at this moment. Pt may not hear from the [facility] until after May.  Hunt Oris, MSW, [place]

## 2014-10-03 ENCOUNTER — Other Ambulatory Visit: Payer: Self-pay | Admitting: *Deleted

## 2014-10-03 MED ORDER — COLCHICINE 0.6 MG PO TABS: 0.6000 mg | ORAL_TABLET | Freq: Every day | ORAL | Status: DC

## 2014-10-03 MED ORDER — ESOMEPRAZOLE MAGNESIUM 40 MG PO CPDR: 40.0000 mg | DELAYED_RELEASE_CAPSULE | Freq: Every day | ORAL | Status: DC

## 2014-10-03 MED ORDER — OLMESARTAN MEDOXOMIL 40 MG PO TABS: 40.0000 mg | ORAL_TABLET | Freq: Every day | ORAL | Status: DC

## 2014-12-08 ENCOUNTER — Ambulatory Visit: Payer: Self-pay

## 2015-01-28 ENCOUNTER — Other Ambulatory Visit: Payer: Self-pay | Admitting: *Deleted

## 2015-01-28 MED ORDER — ESOMEPRAZOLE MAGNESIUM 40 MG PO CPDR: 40.0000 mg | DELAYED_RELEASE_CAPSULE | Freq: Every day | ORAL | Status: DC

## 2015-01-28 MED ORDER — OLMESARTAN MEDOXOMIL 40 MG PO TABS: 40.0000 mg | ORAL_TABLET | Freq: Every day | ORAL | Status: DC

## 2015-01-28 MED ORDER — COLCHICINE 0.6 MG PO TABS: 0.6000 mg | ORAL_TABLET | Freq: Every day | ORAL | Status: AC

## 2015-02-23 ENCOUNTER — Other Ambulatory Visit: Payer: Self-pay | Admitting: *Deleted

## 2015-02-23 DIAGNOSIS — IMO0002 Reserved for concepts with insufficient information to code with codable children: Secondary | ICD-10-CM

## 2015-02-23 DIAGNOSIS — E1169 Type 2 diabetes mellitus with other specified complication: Principal | ICD-10-CM

## 2015-02-23 DIAGNOSIS — E1165 Type 2 diabetes mellitus with hyperglycemia: Secondary | ICD-10-CM

## 2015-02-24 MED ORDER — METFORMIN HCL 1000 MG PO TABS
1000.0000 mg | ORAL_TABLET | Freq: Two times a day (BID) | ORAL | Status: DC
Start: 2015-02-24 — End: 2015-04-08

## 2015-04-08 ENCOUNTER — Other Ambulatory Visit (HOSPITAL_COMMUNITY)
Admission: RE | Admit: 2015-04-08 | Discharge: 2015-04-08 | Disposition: A | Discharge status: Home / Self Care | Payer: Medicaid Other | Source: Ambulatory Visit | Attending: Family Medicine | Admitting: Family Medicine

## 2015-04-08 ENCOUNTER — Ambulatory Visit (INDEPENDENT_AMBULATORY_CARE_PROVIDER_SITE_OTHER): Payer: Medicaid Other | Admitting: Family Medicine

## 2015-04-08 ENCOUNTER — Encounter: Payer: Self-pay | Admitting: Family Medicine

## 2015-04-08 VITALS — BP 128/70 | HR 97 | Temp 98.0°F | Ht 69.0 in | Wt 258.0 lb

## 2015-04-08 DIAGNOSIS — D171 Benign lipomatous neoplasm of skin and subcutaneous tissue of trunk: Secondary | ICD-10-CM | POA: Diagnosis not present

## 2015-04-08 DIAGNOSIS — Z0000 Encounter for general adult medical examination without abnormal findings: Secondary | ICD-10-CM

## 2015-04-08 DIAGNOSIS — R229 Localized swelling, mass and lump, unspecified: Secondary | ICD-10-CM

## 2015-04-08 DIAGNOSIS — E782 Mixed hyperlipidemia: Secondary | ICD-10-CM | POA: Diagnosis not present

## 2015-04-08 DIAGNOSIS — N4889 Other specified disorders of penis: Secondary | ICD-10-CM

## 2015-04-08 DIAGNOSIS — I10 Essential (primary) hypertension: Secondary | ICD-10-CM

## 2015-04-08 DIAGNOSIS — E1165 Type 2 diabetes mellitus with hyperglycemia: Secondary | ICD-10-CM

## 2015-04-08 DIAGNOSIS — E1149 Type 2 diabetes mellitus with other diabetic neurological complication: Secondary | ICD-10-CM | POA: Diagnosis not present

## 2015-04-08 DIAGNOSIS — Z23 Encounter for immunization: Secondary | ICD-10-CM

## 2015-04-08 DIAGNOSIS — E1169 Type 2 diabetes mellitus with other specified complication: Secondary | ICD-10-CM

## 2015-04-08 DIAGNOSIS — E108 Type 1 diabetes mellitus with unspecified complications: Secondary | ICD-10-CM

## 2015-04-08 DIAGNOSIS — F329 Major depressive disorder, single episode, unspecified: Secondary | ICD-10-CM | POA: Diagnosis not present

## 2015-04-08 DIAGNOSIS — R222 Localized swelling, mass and lump, trunk: Secondary | ICD-10-CM

## 2015-04-08 DIAGNOSIS — Z113 Encounter for screening for infections with a predominantly sexual mode of transmission: Secondary | ICD-10-CM | POA: Diagnosis present

## 2015-04-08 DIAGNOSIS — E1065 Type 1 diabetes mellitus with hyperglycemia: Secondary | ICD-10-CM

## 2015-04-08 DIAGNOSIS — IMO0002 Reserved for concepts with insufficient information to code with codable children: Secondary | ICD-10-CM

## 2015-04-08 DIAGNOSIS — F32A Depression, unspecified: Secondary | ICD-10-CM

## 2015-04-08 LAB — COMPLETE METABOLIC PANEL WITH GFR
ALBUMIN: 3.6 g/dL (ref 3.6–5.1)
ALT: 19 U/L (ref 9–46)
AST: 19 U/L (ref 10–35)
Alkaline Phosphatase: 66 U/L (ref 40–115)
BUN: 13 mg/dL (ref 7–25)
CALCIUM: 8.8 mg/dL (ref 8.6–10.3)
CHLORIDE: 96 mmol/L — AB (ref 98–110)
CO2: 25 mmol/L (ref 20–31)
Creat: 0.83 mg/dL (ref 0.70–1.33)
GFR, Est African American: >89 mL/min (ref >=60)
GFR, Est Non African American: >89 mL/min (ref >=60)
Glucose, Bld: 307 mg/dL — ABNORMAL HIGH (ref 65–99)
Potassium: 4.2 mmol/L (ref 3.5–5.3)
Sodium: 136 mmol/L (ref 135–146)
Total Bilirubin: 0.4 mg/dL (ref 0.2–1.2)
Total Protein: 7 g/dL (ref 6.1–8.1)

## 2015-04-08 LAB — POCT URINALYSIS DIPSTICK
BILIRUBIN UA: NEGATIVE
GLUCOSE UA: 500
Leukocytes, UA: NEGATIVE
Nitrite, UA: NEGATIVE
SPEC GRAV UA: 1.025
UROBILINOGEN UA: 0.2
pH, UA: 5.5

## 2015-04-08 LAB — CBC
HEMATOCRIT: 43.4 % (ref 39.0–52.0)
Hemoglobin: 14.4 g/dL (ref 13.0–17.0)
MCH: 27.9 pg (ref 26.0–34.0)
MCHC: 33.2 g/dL (ref 30.0–36.0)
MCV: 83.9 fL (ref 78.0–100.0)
MPV: 8.9 fL (ref 8.6–12.4)
Platelets: 169 10*3/uL (ref 150–400)
RBC: 5.17 MIL/uL (ref 4.22–5.81)
RDW: 14.4 % (ref 11.5–15.5)
WBC: 9.4 10*3/uL (ref 4.0–10.5)

## 2015-04-08 LAB — LIPID PANEL
CHOL/HDL RATIO: 8.5 ratio — AB (ref <=5.0)
Cholesterol: 161 mg/dL (ref 125–200)
HDL: 19 mg/dL — ABNORMAL LOW (ref >=40)
Triglycerides: 559 mg/dL — ABNORMAL HIGH (ref <150)

## 2015-04-08 LAB — TSH: TSH: 2.85 u[IU]/mL (ref 0.350–4.500)

## 2015-04-08 LAB — POCT GLYCOSYLATED HEMOGLOBIN (HGB A1C): Hemoglobin A1C: 11.6

## 2015-04-08 LAB — POCT UA - MICROSCOPIC ONLY

## 2015-04-08 MED ORDER — LISINOPRIL 10 MG PO TABS: 10.0000 mg | ORAL_TABLET | Freq: Every day | ORAL | Status: DC

## 2015-04-08 MED ORDER — ACCU-CHEK AVIVA PLUS W/DEVICE KIT: 1.0000 "application " | PACK | Freq: Every day | Status: DC

## 2015-04-08 MED ORDER — DOXYCYCLINE HYCLATE 100 MG PO TABS: 100.0000 mg | ORAL_TABLET | Freq: Two times a day (BID) | ORAL | Status: DC

## 2015-04-08 MED ORDER — METFORMIN HCL 1000 MG PO TABS: 1000.0000 mg | ORAL_TABLET | Freq: Two times a day (BID) | ORAL | Status: DC

## 2015-04-08 MED ORDER — ROSUVASTATIN CALCIUM 20 MG PO TABS: 20.0000 mg | ORAL_TABLET | Freq: Every day | ORAL | Status: DC

## 2015-04-08 NOTE — Patient Instructions (Signed)
Thanks for coming in today.   We will refill your medications today.   We are checking several labs, and I will call you with those results.   It is very important that we get your diabetes under control, we will see you back in one month to reevaluate your diabetes. In the meantime, please pick up your medications and take them as directed.   Thanks for letting us take care of you.   Sincerely,  Paula Compton, MD Family Medicine - PGY 2

## 2015-04-09 ENCOUNTER — Encounter: Payer: Self-pay | Admitting: Family Medicine

## 2015-04-09 DIAGNOSIS — R222 Localized swelling, mass and lump, trunk: Secondary | ICD-10-CM | POA: Insufficient documentation

## 2015-04-09 LAB — HEPATITIS C ANTIBODY: HCV AB: NEGATIVE

## 2015-04-09 LAB — MICROALBUMIN / CREATININE URINE RATIO
Creatinine, Urine: 212 mg/dL (ref 20–370)
MICROALB/CREAT RATIO: 17 ug/mg{creat} (ref <30)
Microalb, Ur: 3.7 mg/dL

## 2015-04-09 LAB — URINE CYTOLOGY ANCILLARY ONLY
Chlamydia: NEGATIVE
Neisseria Gonorrhea: NEGATIVE

## 2015-04-09 LAB — HIV ANTIBODY (ROUTINE TESTING W REFLEX): HIV: NONREACTIVE

## 2015-04-09 LAB — RPR

## 2015-04-09 NOTE — Assessment & Plan Note (Signed)
History of HLD, on Crestor. No warning signs / symptoms, tolerating medication.  - Continue given that he has Medicaid now and can get medicine much cheaper.  - Check lipid panel today.  - CMET for LFT's.  - Will adjust as indicated.

## 2015-04-09 NOTE — Progress Notes (Signed)
Patient ID: Jacob [name], male   DOB: 1955-10-12, 59 y.o.   MRN: [id number]   [facility] Jacob Hacker, MD Phone: [phone]  Subjective:   # Follow Up  - Pt. Seen by me one year ago. He has multiple poorly controlled medical problems, most significant of which is his Type II Diabetes.  - He did not follow up as directed.  - He feels that he has many barriers to following up. Most significantly is a financial barrier.  - Currently, he does not work, he does not have any source of welfare income because he does not qualify. He lives with his father and step mother in the basement of their home. They provide him with small amounts of cash here and there to help with his needs, but he does not have enough to even put gas in his car. He says that he cannot buy his medications because of his limited financial resources and has thus been getting free metformin from [name] (though he hasn't lately because he hasn't had gas in his car to drive and get his medicine), he has also been getting free medications from the [employer] program, though he has recently gotten Medicaid and will no longer qualify for this program. He now says he does not want Medicaid because this would mean that he would have to pay a small copay to get his medicine.  - [name] trying to apply for disability because he feels that he cannot work because of 1. His Diabetes which is uncontrolled, 2. He does not feel like getting out of the house, and 3. His "mental state".  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 He has lived with his father and step mother who have supported him since then. - This is the overall social context in which we have attempted to address the problems below.   # HTN  - BP on recheck is within goal today 134 / 82.  - He has been on Benicar only. His med list includes Norvasc, Bystolic, Toprol XL, but he has not been taking the others. He has been able to get Benicar from the  MAP program.  - He denies chest pain, SOB, LE edema, changes in his vision, or DOE.  - No issues with the medication, and no history of allergies in the past.    # DMII - Pt. Continues to have very poorly controlled DMII.  - A1C 11.6 today increased from 9.6 one year ago.  - He says he intermittently gets light headed and dizzy.  - He has not been taking his metformin or Glipizide which are the only medications he was on prior to this.  - He was only taking metformin for most of the year because it was the only medication he could get for free.  - He denies vision changes, but has significant urinary frequency, polydypsia, and polyphagia. He says that he has numbness and tingling of his feet. No weakness of his extremities.  - He has has not been able to comply with diet recommendations due to his finances.   # HLD -He was on Crestor for HLD.  - He says he has tolerated it well, but has not been able to get this medicine any more from the [facility] program as they do not carry it there.  - He has otherwise not had any chest pain, SOB, calf pain with ambulation, myalgias.   # Penile Irritation -  Pt. States that he has developed some irritation around the head of his penis.  - It has been going on for 3-4 days now.  - redness / irritation.  - No specific lesions.  - no sexual partners / contact in > 20 years per pt.  - He says he has not had any STD contact that he knows of.  - He admits to polyuria as above, no dysuria, no discharge, no hematuria, no fever, no chills, no suprapubic pain. No flank pain.  - He says he has a hard time cleaning there because of his abdominal girth.  - He has never had this before.   # Lipoma / Back Mass   - Pt. Has a large "mass on his back".  - He says it has been there for "years".  - though he says he and his father feel that it has grown over the past several months.  - He says it does not hurt him, no bleeding, he does not feel pressure from it, but it is  uncomfortable when he lays flat on his back .  - He says that he has not had any weight loss, no night sweats.  - He says that he thinks the mass is a lipoma, but is unsure. No one has ever imaged it or biopsied it before.  - He had a chest CT in 2006 that did not note any abnormality, or asymmetry in the chest wall / subcutaneous tissue.  - No personal or family history of cancer.   All relevant systems were reviewed and were negative unless otherwise noted in the HPI  Past Medical History Reviewed problem list.  Medications- reviewed and updated Current Outpatient Prescriptions  Medication Sig Dispense Refill  . albuterol (PROVENTIL HFA;VENTOLIN HFA) 108 (90 BASE) MCG/ACT inhaler Inhale 2 puffs into the lungs every 6 (six) hours as needed for wheezing. 2 Inhaler 4  . amLODipine (NORVASC) 10 MG tablet Take 1 tablet (10 mg total) by mouth daily before breakfast. 30 tablet 0  . aspirin 325 MG tablet Take 325 mg by mouth daily.    . Blood Glucose Monitoring Suppl (ACCU-CHEK AVIVA PLUS) W/DEVICE KIT 1 application by Does not apply route daily. 1 kit 0  . chlorhexidine (PERIDEX) 0.12 % solution 15 mLs by Mouth Rinse route 2 (two) times daily. 120 mL 0  . colchicine (COLCRYS) 0.6 MG tablet Take 1 tablet (0.6 mg total) by mouth daily. 30 tablet 3  . desloratadine (CLARINEX) 5 MG tablet Take 1 tablet (5 mg total) by mouth daily. 30 tablet 6  . doxycycline (VIBRA-TABS) 100 MG tablet Take 1 tablet (100 mg total) by mouth 2 (two) times daily. 14 tablet 0  . esomeprazole (NEXIUM) 40 MG capsule Take 1 capsule (40 mg total) by mouth daily. 30 capsule 3  . fluticasone (FLONASE) 50 MCG/ACT nasal spray Place 2 sprays into the nose daily as needed. Allergies 16 g 3  . glipiZIDE (GLUCOTROL) 10 MG tablet Take 1 tablet (10 mg total) by mouth 2 (two) times daily. 180 tablet 3  . glucose blood test strip Use as instructed 100 each 12  . lisinopril (PRINIVIL,ZESTRIL) 10 MG tablet Take 1 tablet (10 mg total) by  mouth daily. 90 tablet 3  . metFORMIN (GLUCOPHAGE) 1000 MG tablet Take 1 tablet (1,000 mg total) by mouth 2 (two) times daily with a meal. 180 tablet 3  . metoprolol succinate (TOPROL XL) 100 MG 24 hr tablet 1.5 tablets daily. Take with or immediately  following a meal. 135 tablet 2  . nebivolol (BYSTOLIC) 10 MG tablet Take 1 tablet (10 mg total) by mouth daily. 30 tablet 2  . rosuvastatin (CRESTOR) 20 MG tablet Take 1 tablet (20 mg total) by mouth daily. 90 tablet 3  . sildenafil (VIAGRA) 25 MG tablet Take 1-2 tablets (25-50 mg total) by mouth daily as needed for erectile dysfunction. 20 tablet 0   No current facility-administered medications for this visit.   Chief complaint-noted No additions to family history Social history- patient is a non smoker  Objective: BP 128/70 mmHg  Pulse 97  Temp(Src) 98 F (36.7 C) (Oral)  Ht 5' 9"  (1.753 m)  Wt 258 lb (117.028 kg)  BMI 38.08 kg/m2 Gen: NAD, alert, cooperative with exam HEENT: NCAT, EOMI, PERRL Neck: FROM, supple CV: RRR, good S1/S2, no murmur Resp: CTABL, no wheezes, non-labored Abd: SNTND, BS present, no guarding or organomegaly G/U: Pt. With small amount of erythema and some smegma around the penile head, no frank lesions or drainage, mostly just redness noted, nontender to palpation, no scrotal lesions. No inguinal adenopathy.  Ext: No edema, warm, normal tone, moves UE/LE spontaneously, thorax with Large 10x12 cm mass near his right shoulder blade. It is somewhat mobile, but also slightly hard, not very raised, no skin discoloration or distortion over it. Edges are distinctly palpable. Nonpainful to palpation.  Neuro: Alert and oriented, decreased sensation to light touch and pin prick over BL plantar surfaces. Proprioception in tact. Otherwise no gross deficits.  Skin: no rashes no lesions  Assessment/Plan:  HYPERTENSION, BENIGN SYSTEMIC Pt. Within goal on recheck. No red flag signs / symptoms.  - Switching to Lisinopril daily  due to cost.  - Will discontinue all other listed meds including Norvasc, Bystolic, Benicar, Toprol XL as he is not taking these anyway and has no other reason to be on them.  - Checking CMET today for kidney function.   Type II diabetes mellitus with neurological manifestations, uncontrolled ([place]) He has not been taking any of his medicine. When he does take it, he uses Metformin because it is free at [facility]. His A1C today is 11.6, he has polyuria, polydypsia, penile irritation I suspect from the high glucose content of his urine causing direct irritation, and he has peripheral neuropathy. His trajectory is not great. I have worked on motivating him to take his medicine and to get his medicine. This is primarily related to the social issues outlined in my note.  - check CMET, Urine microalbumin today.  - Pt. On Benicar, switching to lisinopril.  - Home glucose monitoring kit ordered for him.  - Metformin and Glipizide for now, as I do not think it is safe nor would it be effective to start insulin given his compliance and follow up history.  - He needs to follow up in one month with glucose readings so that we can adjust his medicine to get his glucose down. I repeatedly stressed the importance of this to him. He says he will return in 4-6 weeks.  - He had eye exam last year, and will follow up with his ophthalmologist again this year.  - Foot exam done today.  - Diet recommendations discussed.   HYPERLIPIDEMIA History of HLD, on Crestor. No warning signs / symptoms, tolerating medication.  - Continue given that he has Medicaid now and can get medicine much cheaper.  - Check lipid panel today.  - CMET for LFT's.  - Will adjust as indicated.   Mass  on back Pt. With what feels like a lipoma on his back, though he says it is growing, and is hard to tell how mobile it is mostly due to size. It is somewhat more dense than one would expect with a lipoma. Examined and reviewed with attending  Dr. Ree Kida. No other red flag s/s including weight loss, night sweats. No skin lesions overlying.  - Will get ultrasound for density / complexity.  - May get MRI depending on results from ultrasound.  - Discussed with pt.   Penile Irritation - pt. With redness / irritation which given his history of polyuria and uncontrolled DMII seems to be the most likely source of this irritation. No new sexual partners, and no source of STD contact per pt. No discharge. Otherwise no symptoms indicating cystitis or prostatitis.  - Will check Hep C for routine screening, HIV for routine screening, RPR.  - Hygiene discussed.  - If symptoms worsen or fail to improve, return for further evaluation.

## 2015-04-09 NOTE — Assessment & Plan Note (Signed)
Pt. With what feels like a lipoma on his back, though he says it is growing, and is hard to tell how mobile it is mostly due to size. It is somewhat more dense than one would expect with a lipoma. Examined and reviewed with attending Dr. Ree Kida. No other red flag s/s including weight loss, night sweats. No skin lesions overlying.  - Will get ultrasound for density / complexity.  - May get MRI depending on results from ultrasound.  - Discussed with pt.

## 2015-04-09 NOTE — Assessment & Plan Note (Signed)
Pt. Within goal on recheck. No red flag signs / symptoms.  - Switching to Lisinopril daily due to cost.  - Will discontinue all other listed meds including Norvasc, Bystolic, Benicar, Toprol XL as he is not taking these anyway and has no other reason to be on them.  - Checking CMET today for kidney function.

## 2015-04-09 NOTE — Assessment & Plan Note (Signed)
He has not been taking any of his medicine. When he does take it, he uses Metformin because it is free at [facility]. His A1C today is 11.6, he has polyuria, polydypsia, penile irritation I suspect from the high glucose content of his urine causing direct irritation, and he has peripheral neuropathy. His trajectory is not great. I have worked on motivating him to take his medicine and to get his medicine. This is primarily related to the social issues outlined in my note.  - check CMET, Urine microalbumin today.  - Pt. On Benicar, switching to lisinopril.  - Home glucose monitoring kit ordered for him.  - Metformin and Glipizide for now, as I do not think it is safe nor would it be effective to start insulin given his compliance and follow up history.  - He needs to follow up in one month with glucose readings so that we can adjust his medicine to get his glucose down. I repeatedly stressed the importance of this to him. He says he will return in 4-6 weeks.  - He had eye exam last year, and will follow up with his ophthalmologist again this year.  - Foot exam done today.  - Diet recommendations discussed.

## 2015-04-15 ENCOUNTER — Encounter: Payer: Self-pay | Admitting: Family Medicine

## 2015-04-16 ENCOUNTER — Ambulatory Visit (HOSPITAL_COMMUNITY)
Admission: RE | Admit: 2015-04-16 | Discharge: 2015-04-16 | Disposition: A | Discharge status: Home / Self Care | Payer: Medicaid Other | Source: Ambulatory Visit | Attending: Family Medicine | Admitting: Family Medicine

## 2015-04-16 DIAGNOSIS — F329 Major depressive disorder, single episode, unspecified: Secondary | ICD-10-CM | POA: Insufficient documentation

## 2015-04-16 DIAGNOSIS — E1165 Type 2 diabetes mellitus with hyperglycemia: Secondary | ICD-10-CM | POA: Diagnosis not present

## 2015-04-16 DIAGNOSIS — E1169 Type 2 diabetes mellitus with other specified complication: Secondary | ICD-10-CM | POA: Diagnosis not present

## 2015-04-16 DIAGNOSIS — I10 Essential (primary) hypertension: Secondary | ICD-10-CM | POA: Insufficient documentation

## 2015-04-16 DIAGNOSIS — D171 Benign lipomatous neoplasm of skin and subcutaneous tissue of trunk: Secondary | ICD-10-CM | POA: Insufficient documentation

## 2015-04-16 DIAGNOSIS — IMO0002 Reserved for concepts with insufficient information to code with codable children: Secondary | ICD-10-CM

## 2015-04-16 DIAGNOSIS — Z0000 Encounter for general adult medical examination without abnormal findings: Secondary | ICD-10-CM | POA: Insufficient documentation

## 2015-04-16 DIAGNOSIS — N4889 Other specified disorders of penis: Secondary | ICD-10-CM | POA: Diagnosis not present

## 2015-04-16 DIAGNOSIS — E1149 Type 2 diabetes mellitus with other diabetic neurological complication: Secondary | ICD-10-CM | POA: Diagnosis not present

## 2015-04-16 DIAGNOSIS — F32A Depression, unspecified: Secondary | ICD-10-CM

## 2015-04-16 DIAGNOSIS — E782 Mixed hyperlipidemia: Secondary | ICD-10-CM | POA: Diagnosis not present

## 2015-04-22 ENCOUNTER — Ambulatory Visit: Payer: Medicaid Other | Admitting: Family Medicine

## 2015-04-28 ENCOUNTER — Telehealth: Payer: Self-pay | Admitting: Family Medicine

## 2015-04-28 NOTE — Telephone Encounter (Signed)
Attempted to call with results. Unable to leave voicemail. I am concerned that given the size of the mass / ? Lipoma and the fact that it has grown there may be a neoplastic / malignant component. He needs an mri to further evaluate. Attempted to contact pt. To let him know this, but have been unsuccessful so far. Will continue to try to get in touch with him and send him a letter.   CGM MD

## 2015-05-20 ENCOUNTER — Telehealth: Payer: Self-pay | Admitting: Family Medicine

## 2015-05-20 DIAGNOSIS — R222 Localized swelling, mass and lump, trunk: Secondary | ICD-10-CM

## 2015-05-20 NOTE — Telephone Encounter (Signed)
Pt calling because he had never heard back about xray results for his back. He would like to know these asap. Sadie Reynolds, ASA

## 2015-05-20 NOTE — Telephone Encounter (Signed)
Will forward to MD. Zina Pitzer,CMA  

## 2015-05-26 NOTE — Telephone Encounter (Signed)
See previous phone note. I have been calling to give him the results. I also mailed a letter with the results to his house. Called again this am, and his mother answered. She said she would tell him to call back whenever he gets up. Pt. Had actually previously told me not to call his phone due to it "costing him minutes" and to send results by mail, which we did. Thanks  CGM MD

## 2015-05-26 NOTE — Telephone Encounter (Signed)
Pt is calling back and said that he has minutes now and would like the doctor to call him. Blima Rich

## 2015-05-26 NOTE — Telephone Encounter (Signed)
Will forward message to PCP.

## 2015-05-27 NOTE — Telephone Encounter (Signed)
Called to discuss lab results. Will get MRI to further evaluate given that he had said that it was growing since this past July. MRI w/wo contrast.   CGM MD

## 2015-06-03 ENCOUNTER — Ambulatory Visit (INDEPENDENT_AMBULATORY_CARE_PROVIDER_SITE_OTHER): Payer: Medicaid Other | Admitting: Family Medicine

## 2015-06-03 ENCOUNTER — Encounter: Payer: Self-pay | Admitting: Family Medicine

## 2015-06-03 VITALS — BP 156/102 | HR 96 | Temp 98.5°F | Ht 69.0 in | Wt 255.1 lb

## 2015-06-03 DIAGNOSIS — E1142 Type 2 diabetes mellitus with diabetic polyneuropathy: Secondary | ICD-10-CM

## 2015-06-03 DIAGNOSIS — E782 Mixed hyperlipidemia: Secondary | ICD-10-CM

## 2015-06-03 DIAGNOSIS — E1140 Type 2 diabetes mellitus with diabetic neuropathy, unspecified: Secondary | ICD-10-CM

## 2015-06-03 DIAGNOSIS — M25562 Pain in left knee: Secondary | ICD-10-CM | POA: Diagnosis not present

## 2015-06-03 DIAGNOSIS — I10 Essential (primary) hypertension: Secondary | ICD-10-CM | POA: Diagnosis not present

## 2015-06-03 DIAGNOSIS — IMO0002 Reserved for concepts with insufficient information to code with codable children: Secondary | ICD-10-CM

## 2015-06-03 DIAGNOSIS — E1165 Type 2 diabetes mellitus with hyperglycemia: Secondary | ICD-10-CM

## 2015-06-03 DIAGNOSIS — E1169 Type 2 diabetes mellitus with other specified complication: Secondary | ICD-10-CM

## 2015-06-03 MED ORDER — IBUPROFEN 600 MG PO TABS: 600.0000 mg | ORAL_TABLET | Freq: Four times a day (QID) | ORAL | Status: DC | PRN

## 2015-06-03 MED ORDER — AMLODIPINE BESYLATE 10 MG PO TABS: 10.0000 mg | ORAL_TABLET | Freq: Every day | ORAL | Status: DC

## 2015-06-03 MED ORDER — LISINOPRIL 10 MG PO TABS: 10.0000 mg | ORAL_TABLET | Freq: Every day | ORAL | Status: DC

## 2015-06-03 MED ORDER — GLIPIZIDE 10 MG PO TABS: 10.0000 mg | ORAL_TABLET | Freq: Two times a day (BID) | ORAL | Status: DC

## 2015-06-03 MED ORDER — ROSUVASTATIN CALCIUM 20 MG PO TABS: 20.0000 mg | ORAL_TABLET | Freq: Every day | ORAL | Status: DC

## 2015-06-03 MED ORDER — METFORMIN HCL 1000 MG PO TABS: 1000.0000 mg | ORAL_TABLET | Freq: Two times a day (BID) | ORAL | Status: DC

## 2015-06-03 NOTE — Patient Instructions (Signed)
Thanks for coming in today.   We have refilled your medications.   We started you on ibuprofen for your knee pain. Take this every 6 hours.   Ice your knee 30 minutes each day.   Get the x-rays within the next 2 weeks.   I'll see you back in one month.   Thanks for letting us take care of you.   Sincerely, Paula Compton, MD Family Medicine - PGY 2

## 2015-06-03 NOTE — Progress Notes (Signed)
Patient ID: Jacob [name], male   DOB: 1956-04-08, 60 y.o.   MRN: [id number]   [facility] Aquilla Hacker, MD Phone: [phone]  Subjective:   # Left Knee Pain - pt. Here with acute worsening of left knee pain over the past 4-5 weeks.  - Has had this for over six months now.  - No injuries that he can remember,nor has he ever had trauma to the knee.  - He says that he feels "crunching" in his knee when he walks.  - Worse throughout the day as opposed to the morning.  - No swelling - No redness, no tenderness to palpation.  - Pain is medial. - No locking - He does have some instability while walking, and has had it give out on him once.  - No pain in the right knee - He does have gout, but does not feel that this is a gout flare. Says this is more chronic pain.  - has tried low dose ibuprofen.   All relevant systems were reviewed and were negative unless otherwise noted in the HPI  Past Medical History Reviewed problem list.  Medications- reviewed and updated Current Outpatient Prescriptions  Medication Sig Dispense Refill  . albuterol (PROVENTIL HFA;VENTOLIN HFA) 108 (90 BASE) MCG/ACT inhaler Inhale 2 puffs into the lungs every 6 (six) hours as needed for wheezing. 2 Inhaler 4  . amLODipine (NORVASC) 10 MG tablet Take 1 tablet (10 mg total) by mouth daily before breakfast. 90 tablet 4  . aspirin 325 MG tablet Take 325 mg by mouth daily.    . Blood Glucose Monitoring Suppl (ACCU-CHEK AVIVA PLUS) W/DEVICE KIT 1 application by Does not apply route daily. 1 kit 0  . chlorhexidine (PERIDEX) 0.12 % solution 15 mLs by Mouth Rinse route 2 (two) times daily. 120 mL 0  . colchicine (COLCRYS) 0.6 MG tablet Take 1 tablet (0.6 mg total) by mouth daily. 30 tablet 3  . desloratadine (CLARINEX) 5 MG tablet Take 1 tablet (5 mg total) by mouth daily. 30 tablet 6  . doxycycline (VIBRA-TABS) 100 MG tablet Take 1 tablet (100 mg total) by mouth 2 (two) times daily. 14 tablet 0   . esomeprazole (NEXIUM) 40 MG capsule Take 1 capsule (40 mg total) by mouth daily. 30 capsule 3  . fluticasone (FLONASE) 50 MCG/ACT nasal spray Place 2 sprays into the nose daily as needed. Allergies 16 g 3  . glipiZIDE (GLUCOTROL) 10 MG tablet Take 1 tablet (10 mg total) by mouth 2 (two) times daily. 180 tablet 3  . glucose blood test strip Use as instructed 100 each 12  . ibuprofen (ADVIL,MOTRIN) 600 MG tablet Take 1 tablet (600 mg total) by mouth every 6 (six) hours as needed for moderate pain. 120 tablet 3  . lisinopril (PRINIVIL,ZESTRIL) 10 MG tablet Take 1 tablet (10 mg total) by mouth daily. 90 tablet 3  . metFORMIN (GLUCOPHAGE) 1000 MG tablet Take 1 tablet (1,000 mg total) by mouth 2 (two) times daily with a meal. 180 tablet 3  . metFORMIN (GLUCOPHAGE) 1000 MG tablet Take 1 tablet (1,000 mg total) by mouth 2 (two) times daily with a meal. 180 tablet 3  . metoprolol succinate (TOPROL XL) 100 MG 24 hr tablet 1.5 tablets daily. Take with or immediately following a meal. 135 tablet 2  . nebivolol (BYSTOLIC) 10 MG tablet Take 1 tablet (10 mg total) by mouth daily. 30 tablet 2  . rosuvastatin (CRESTOR) 20 MG tablet Take 1 tablet (  20 mg total) by mouth daily. 90 tablet 3  . sildenafil (VIAGRA) 25 MG tablet Take 1-2 tablets (25-50 mg total) by mouth daily as needed for erectile dysfunction. 20 tablet 0   No current facility-administered medications for this visit.   Chief complaint-noted No additions to family history Social history- patient is a non smoker  Objective: BP 156/102 mmHg  Pulse 96  Temp(Src) 98.5 F (36.9 C) (Oral)  Ht 5' 9"  (1.753 m)  Wt 255 lb 1.6 oz (115.713 kg)  BMI 37.65 kg/m2 Gen: NAD, alert, cooperative with exam HEENT: NCAT, EOMI, PERRL Neck: FROM, supple CV: RRR, good S1/S2, no murmur Resp: CTABL, no wheezes, non-labored Abd: SNTND, BS present, no guarding or organomegaly Ext: No edema, warm, normal tone, moves UE/LE spontaneously Left Knee:  - Crepitus  with ROM exam - FROM - No redness, swelling or tenderness.  - Mild medial joint line tenderness.  - Positive Thessaly on the left medially - Negative Lachman / Anterior drawer - No Patellar knee pain. Patellar tracking without pain to compression.  - No instability.  Neuro: Alert and oriented, Peripheral neuropathy / numbness stable from previous of BL LE.  Skin: no rashes no lesions  Assessment/Plan:  # Left Medial knee pain- Pt. With pain and exam consistent with likely arthritis and possible medial degenerative meniscus tear. No way to know about meniscus without MRI, though this is very possible. [name] Obese as well. Has tried low dose ibuprofen - Ibuprofen 650m q6 hr.  - Ice 30 min. Daily.  - 4 view knee films.  - Leg strengthening exercises.  - Follow up in 4 weeks - If he fails conservative management. Will give corticosteroid injection.  - Consider MRI in the future if continued pain or destabalization.  - PT referral as well if he fails conservative therapy. He needs to stabilize his knee with strengthening.

## 2015-06-05 ENCOUNTER — Telehealth: Payer: Self-pay | Admitting: *Deleted

## 2015-06-05 NOTE — Telephone Encounter (Signed)
Prior Authorization received from [facility] for Crestor 20 mg. Formulary and PA form placed in provider box for completion. Martin, Tamika L, RN  

## 2015-06-08 NOTE — Telephone Encounter (Signed)
PA complete  CGM MD

## 2015-06-09 ENCOUNTER — Telehealth: Payer: Self-pay | Admitting: Family Medicine

## 2015-06-09 NOTE — Telephone Encounter (Signed)
Received PA approval for Crestor 20 mg via [employer].  Med approved for 06/09/15 - 10/07/15.  [facility] informed.  PA approval number [id number].Hassell Done, Rosine Beat, RN

## 2015-06-09 NOTE — Telephone Encounter (Signed)
X-ray orders are in. We discussed at his office visit that he can just walk in to [facility] to get the x-rays of his knee. Just let him know he can go at any time. Thanks  CGM MD

## 2015-06-09 NOTE — Telephone Encounter (Signed)
Pt called pt has an appointment for 06/11/15 for a MRI and he said that he also needs x-rays of his knee and would like to get that done while he is there. Please call patient and let him know either way. jw

## 2015-06-09 NOTE — Telephone Encounter (Signed)
LM for patient with message from MD.  Jamelle Goldston,CMA ° °

## 2015-06-09 NOTE — Telephone Encounter (Signed)
Will forward to MD to place order for xrays.  Patient doesn't need an appt for xray he can walk in and get these. Jaxsin Bottomley,CMA

## 2015-06-11 ENCOUNTER — Other Ambulatory Visit: Payer: Self-pay | Admitting: Family Medicine

## 2015-06-11 ENCOUNTER — Ambulatory Visit (HOSPITAL_COMMUNITY)
Admission: RE | Admit: 2015-06-11 | Discharge: 2015-06-11 | Disposition: A | Discharge status: Home / Self Care | Payer: Medicaid Other | Source: Ambulatory Visit | Attending: Family Medicine | Admitting: Family Medicine

## 2015-06-11 ENCOUNTER — Telehealth: Payer: Self-pay | Admitting: Family Medicine

## 2015-06-11 DIAGNOSIS — R222 Localized swelling, mass and lump, trunk: Secondary | ICD-10-CM | POA: Diagnosis present

## 2015-06-11 DIAGNOSIS — M25562 Pain in left knee: Secondary | ICD-10-CM

## 2015-06-11 LAB — CREATININE, SERUM: CREATININE: 0.85 mg/dL (ref 0.61–1.24)

## 2015-06-11 MED ORDER — GADOBENATE DIMEGLUMINE 529 MG/ML IV SOLN
20.0000 mL | Freq: Once | INTRAVENOUS | Status: AC | PRN
  Administered 2015-06-11: 20 mL via INTRAVENOUS

## 2015-06-11 NOTE — Telephone Encounter (Signed)
Called with MRI results. Simple lipoma. Pt. Can have it removed if needed. Left VM for him to call back with decision regarding referral to surgery or not.   CGM MD

## 2015-07-09 ENCOUNTER — Encounter: Payer: Self-pay | Admitting: Family Medicine

## 2015-07-09 ENCOUNTER — Ambulatory Visit (INDEPENDENT_AMBULATORY_CARE_PROVIDER_SITE_OTHER): Payer: Medicaid Other | Admitting: Family Medicine

## 2015-07-09 VITALS — BP 140/84 | HR 98 | Temp 97.9°F | Wt 255.6 lb

## 2015-07-09 DIAGNOSIS — M1712 Unilateral primary osteoarthritis, left knee: Secondary | ICD-10-CM | POA: Diagnosis present

## 2015-07-09 NOTE — Patient Instructions (Signed)
Thanks for coming in today.   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   We injected it today.   This should help for the time being, but may only last 1-2 months.   We can repeat the injection every 3 months if you need.   Let me know if you want a referral to general surgery to have that lipoma removed.   Thanks for letting us take care of you.   Sincerely, Paula Compton, MD Family Medicine - PGY 2

## 2015-07-10 NOTE — Progress Notes (Signed)
Patient ID: Jacob [name], male   DOB: 21-Jun-1955, 60 y.o.   MRN: [id number]   [facility] Aquilla Hacker, MD Phone: [phone]  Subjective:   # Knee Injection - here today for knee injection of left knee for osteoarthritis.  - He has been using ibuprofen with some relief initially, but this only lasted a short while.  - His pain is worse, and is impairing his quality of life.  - No swelling, erythema, no joint locking, catching, he says he feels a pop / crepitus every now and then. No instability.  - He has pain on the medial side of his left knee.  - He has not fallen or had any injury since his last visit.   All relevant systems were reviewed and were negative unless otherwise noted in the HPI  Past Medical History Reviewed problem list.  Medications- reviewed and updated Current Outpatient Prescriptions  Medication Sig Dispense Refill  . albuterol (PROVENTIL HFA;VENTOLIN HFA) 108 (90 BASE) MCG/ACT inhaler Inhale 2 puffs into the lungs every 6 (six) hours as needed for wheezing. 2 Inhaler 4  . amLODipine (NORVASC) 10 MG tablet Take 1 tablet (10 mg total) by mouth daily before breakfast. 90 tablet 4  . aspirin 325 MG tablet Take 325 mg by mouth daily.    . Blood Glucose Monitoring Suppl (ACCU-CHEK AVIVA PLUS) W/DEVICE KIT 1 application by Does not apply route daily. 1 kit 0  . colchicine (COLCRYS) 0.6 MG tablet Take 1 tablet (0.6 mg total) by mouth daily. 30 tablet 3  . fluticasone (FLONASE) 50 MCG/ACT nasal spray Place 2 sprays into the nose daily as needed. Allergies 16 g 3  . glipiZIDE (GLUCOTROL) 10 MG tablet Take 1 tablet (10 mg total) by mouth 2 (two) times daily. 180 tablet 3  . glucose blood test strip Use as instructed 100 each 12  . ibuprofen (ADVIL,MOTRIN) 600 MG tablet Take 1 tablet (600 mg total) by mouth every 6 (six) hours as needed for moderate pain. 120 tablet 3  . lisinopril (PRINIVIL,ZESTRIL) 10 MG tablet Take 1 tablet (10 mg total) by  mouth daily. 90 tablet 3  . metFORMIN (GLUCOPHAGE) 1000 MG tablet Take 1 tablet (1,000 mg total) by mouth 2 (two) times daily with a meal. 180 tablet 3  . rosuvastatin (CRESTOR) 20 MG tablet Take 1 tablet (20 mg total) by mouth daily. 90 tablet 3   No current facility-administered medications for this visit.   Chief complaint-noted No additions to family history Social history- patient is a non smoker  Objective: BP 140/84 mmHg  Pulse 98  Temp(Src) 97.9 F (36.6 C) (Oral)  Wt 255 lb 9.6 oz (115.939 kg) Gen: NAD, alert, cooperative with exam HEENT: NCAT, EOMI, PERRL Neck: FROM, supple CV: RRR, good S1/S2, no murmu Resp: CTABL, no wheezes, non-labored Abd: SNTND, BS present, no guarding or organomegaly Ext: No edema, warm, normal tone, moves UE/LE spontaneously Left Knee: FROM, Crepitus to exam, negative AD, PD, Lachman, Thessalay. No TTP, no erythema or swelling noted.  Right Knee: FROM passive and active, normal exam.  Neuro: Alert and oriented, No gross deficits Skin: no rashes no lesions  Informed consent obtained and placed in chart.  Time out performed.  Area cleaned with iodine x 3 and wiped clear with alcohol swab.  Using 21 1/2 gauge needle 1 cc Kenalog and 3 cc's 1% Lidocaine were injected in left knee via the anteromedial approach. Successful joint access.  Sterile bandage placed.  Patient  tolerated procedure well.  No complications.   Assessment/Plan:  Left Knee Osteoarthritis  - Injected knee today. He had some relief immediately.  - We discussed treatment options at length, he understands that this is a temporary treatment, and is solely for treatment of his symptoms.  - Hopefully will get symptom relief with this.  - See procedure note above. - Continue ibuprofen as needed.

## 2015-11-26 ENCOUNTER — Encounter: Payer: Self-pay | Admitting: Family Medicine

## 2015-11-26 ENCOUNTER — Ambulatory Visit (INDEPENDENT_AMBULATORY_CARE_PROVIDER_SITE_OTHER): Payer: Medicaid Other | Admitting: Family Medicine

## 2015-11-26 VITALS — BP 220/110 | Temp 98.5°F | Wt 255.6 lb

## 2015-11-26 DIAGNOSIS — M179 Osteoarthritis of knee, unspecified: Secondary | ICD-10-CM

## 2015-11-26 DIAGNOSIS — M1712 Unilateral primary osteoarthritis, left knee: Secondary | ICD-10-CM

## 2015-11-26 MED ORDER — MELOXICAM 15 MG PO TABS
15.0000 mg | ORAL_TABLET | Freq: Every day | ORAL | 0 refills | Status: DC
Start: 2015-11-26 — End: 2016-06-08

## 2015-11-26 NOTE — Progress Notes (Signed)
Patient ID: Jacob [name], male   DOB: [birth year], 60 y.o.   MRN: [id number]  Chief Complaint  Patient presents with  . Knee Pain    HPI  Jacob [name] is a 60 year old male with a history of HTN, T2DM, gout, obesity, OSA, presents to clinic for evaluation of L knee pain.  Knee Pain The patient reports a 2 yr history of left sided knee pain and was found to have osteoarthritis earlier this year. He received a knee injection for his pain by Dr. Minda Ditto in 07/2015 that provided relief for about 10 days. He reports that exercise and taking ibuprofen have not improved his knee pain since then. He endorses pain with walking and occasionally at rest. In addition, he reports bony crepitus, catching and locking of the joint. He denies any fever, N/V, erythema, swelling, rash, gait instability, urinary or bowel changes. He would like a referral to a specialist for further evaluation. Today he says his pain is less severe than usual.  Past Medical History:  Diagnosis Date  . Arthritis 08-10-11   1 month ago-ankles/feet  . Chronic cough 08-10-11   more than 6 yrs from sinus drainage  . Depression 08-10-11   depression(due to childhood issues)  . Diabetes mellitus 08-10-11   tx. oral meds.  . Hypertension 08-10-11   tx. meds  . Kidney stones 08-10-11   once in past, none recent  . Methylprednisolone-induced hypoxia 08/23/2011  . Neuromuscular disorder ([place]) 08-10-11   "burning/tingling sensation In feet"  . Obesity   . OSA (obstructive sleep apnea) 08/23/2011  . Shortness of breath 08-10-11   with increased activity only  . Umbilical hernia     Past Surgical History:  Procedure Laterality Date  . CATARACT EXTRACTION  08-10-11   rt. eye 2-3 months ago.  [facility] COLONOSCOPY W/ POLYPECTOMY  08-10-11   1 month ago  . VENTRAL HERNIA REPAIR  08/22/2011   Procedure: HERNIA REPAIR VENTRAL ADULT;  Surgeon: Adin Hector, MD;  Location: [facility];  Service: General;  Laterality: N/A;  incarcerated    Family  History  Problem Relation Age of Onset  . Colon cancer Neg Hx   . Hyperlipidemia Mother   . Diabetes Mother   . Heart disease Father   . Hyperlipidemia Father     Social History Social History  Substance Use Topics  . Smoking status: Never Smoker  . Smokeless tobacco: Never Used  . Alcohol use No    Allergies  Allergen Reactions  . Methylprednisolone Acetate     Respiratory failure    Current Outpatient Prescriptions  Medication Sig Dispense Refill  . albuterol (PROVENTIL HFA;VENTOLIN HFA) 108 (90 BASE) MCG/ACT inhaler Inhale 2 puffs into the lungs every 6 (six) hours as needed for wheezing. 2 Inhaler 4  . amLODipine (NORVASC) 10 MG tablet Take 1 tablet (10 mg total) by mouth daily before breakfast. 90 tablet 4  . aspirin 325 MG tablet Take 325 mg by mouth daily.    . Blood Glucose Monitoring Suppl (ACCU-CHEK AVIVA PLUS) W/DEVICE KIT 1 application by Does not apply route daily. 1 kit 0  . colchicine (COLCRYS) 0.6 MG tablet Take 1 tablet (0.6 mg total) by mouth daily. 30 tablet 3  . fluticasone (FLONASE) 50 MCG/ACT nasal spray Place 2 sprays into the nose daily as needed. Allergies 16 g 3  . glipiZIDE (GLUCOTROL) 10 MG tablet Take 1 tablet (10 mg total) by mouth 2 (two) times daily. 180 tablet 3  .  glucose blood test strip Use as instructed 100 each 12  . lisinopril (PRINIVIL,ZESTRIL) 10 MG tablet Take 1 tablet (10 mg total) by mouth daily. 90 tablet 3  . meloxicam (MOBIC) 15 MG tablet Take 1 tablet (15 mg total) by mouth daily. 30 tablet 0  . metFORMIN (GLUCOPHAGE) 1000 MG tablet Take 1 tablet (1,000 mg total) by mouth 2 (two) times daily with a meal. 180 tablet 3  . rosuvastatin (CRESTOR) 20 MG tablet Take 1 tablet (20 mg total) by mouth daily. 90 tablet 3   No current facility-administered medications for this visit.     Review of Systems  All other systems reviewed and are negative.   OBJECTIVE Blood pressure (!) 220/110, temperature 98.5 F (36.9 C), temperature  source Oral, weight 255 lb 9.6 oz (115.9 kg). Physical Exam  Constitutional: He appears well-developed and well-nourished.  Obese male in no acute distress   HENT:  Head: Normocephalic and atraumatic.  Eyes: EOM are normal. Pupils are equal, round, and reactive to light. No scleral icterus.  Cardiovascular: Normal rate, regular rhythm and normal heart sounds.   No murmur heard. Pulmonary/Chest: Effort normal and breath sounds normal. He has no wheezes.  Musculoskeletal: Normal range of motion. He exhibits no tenderness.       Right shoulder: He exhibits effusion and crepitus.  Mild effusion on medial side. Crepitus present. Anterior and posterior drawer sign negative. McMurray and Lachman's tests negative     Assessment/Plan [name] is a 60 year old man with an extensive past medical history presenting to clinic for evaulation of L knee pain. The patient's history, symptoms and physical exam findings are consistent with osteoarthritis.   Knee Pain -start Mobic daily for 5 days then as needed (avoid Ibuprofen, Advil, Aleve, or Naproxen while you take this medication) -referred to Sports Medicine -consider physical therapy   Patient barriers to medication access discussed and appropriate resources provided.   [name], Medical student   RESIDENT ADDENDUM  I have separately seen and examined the patient. I have discussed the findings and exam with the medical student and agree with the above note, which I have edited appropriately. I helped develop the management plan that is described in the student's note, and I agree with the content.  Additionally I have outlined my exam and assessment/plan below:  The patient is present with a two-year history of left knee pain found to have osteoarthritis. He had a very transient response to local knee injection in March. Unfortunately, ibuprofen, rest, and ice have not helped. He would like a referral for definitive treatment for his  words.  PE:  No acute distress. Left knee: Mild effusion noted on the L. No ecchymoses noted.   No obvious Baker's cysts Palpation normal with no warmth or joint line tenderness or patellar tenderness or condyle tenderness.  No TTP along infrapatellar or pes anserine bursas.  ROM normal in flexion (135 degrees) and extension (0 degrees) and lower leg rotation with crepitus noted.  Ligaments with solid consistent endpoints including ACL, PCL, LCL, MCL.  Negative Anterior and Posterior Drawer/Lachman.  Negative Mcmurray's. Negative Thessaly. Non painful patellar compression.  Normal Patellar glide.  No apprehension  Patellar and quadriceps tendons unremarkable. Hamstring and quadriceps strength is normal.   A/P:  This is a 60 y/o with  OA noted on xray presenting for continued L knee pain.  He has normal endpoints on ligamentous exam. This does not seem to be meniscal in nature. -We'll start Mobic  daily for 5 days then as needed. Referral to sports medicine. Given his x-rays, I do not feel that he is anywhere near surgical intervention at this time and I told him of such.  The patient would also benefit from physical therapy, however he would like to see a specialist about his knee at this time.   Archie Patten, MD, MPH PGY-3,  [facility]

## 2015-11-26 NOTE — Patient Instructions (Signed)
Take Mobic daily for 5 days, then as needed daily for pain. Do not take Ibuprofen, Advil, Aleve, or Naproxen while you take this medication. I will refer you to sports medicine, they'll call you with an appointment.

## 2015-12-08 ENCOUNTER — Encounter: Payer: Self-pay | Admitting: Family Medicine

## 2015-12-08 ENCOUNTER — Ambulatory Visit (INDEPENDENT_AMBULATORY_CARE_PROVIDER_SITE_OTHER): Payer: Medicaid Other | Admitting: Family Medicine

## 2015-12-08 DIAGNOSIS — M25562 Pain in left knee: Secondary | ICD-10-CM | POA: Insufficient documentation

## 2015-12-08 NOTE — Patient Instructions (Addendum)
Thank you for coming in,   You can call your insurance and see if they will cover Voltaren gel.   We are referring you to an orthopedists in order to visco supplementation.    [facility] Dr Rhina Brackett The receptionist will call you with the appointment time Monday afternoon [address] [place], [place] [address] Phone: [phone]   Please try the exercises on a daily basis.    Please feel free to call with any questions or concerns at any time, at [phone]. --Dr. Raeford Razor

## 2015-12-08 NOTE — Assessment & Plan Note (Signed)
Pain is most likely occurring secondary to arthritic changes as well as patellofemoral syndrome. He has a history of gout but no warmth or redness over the joint. No chondrocalcinosis on x-ray to suggest pseudogout. - Trying to avoid NSAIDs as he has uncontrolled hypertension and has trouble affording medications. - Provided home exercises for VMO strengthening. - Referral to orthopedics for the left knee pain. Consider viscous supplementation. - He'll follow-up as needed.

## 2015-12-08 NOTE — Progress Notes (Signed)
  KIANI STANDING - 60 y.o. male MRN [id number]  Date of birth: 10-24-1955  SUBJECTIVE:  Including CC & ROS.  Chief Complaint  Patient presents with  . Knee Pain    Mr. Wynn Banker is a 60 year old male that is presenting with a 2 year history of left-sided knee pain. He has had injection therapy in February 2017 that provided minimal relief. [name] taking anti-inflammatories with some improvement. The pain is occurring in the anterior aspect of his left knee. He denies any prior injury to his knee. He denies any locking or swelling. The pain is intermittent in nature. He currently is not doing any home exercises or any physical therapy. He has a most trouble walking or going up and down stairs. CT cannot go straight up the stairs and has to go from the side. He reports that his feet sometimes go numb at night and he suffers from peripheral neuropathy secondary to uncontrolled diabetes.   ROS: No unexpected weight loss, fever, chills, swelling, instability, muscle pain,  redness, otherwise see HPI   HISTORY: Past Medical, Surgical, Social, and Family History Reviewed & Updated per EMR.   Pertinent Historical Findings include: PMSHx -  OSA, type 2 Dm, depression, anxiety, HTN, peripheral neuropathy.  PSHx -  No tobacco or alcohol use, currently on disability  FHx -  Dm2, HTN  Medications - lisinopril, glipizide, crestor, amlodipine,   DATA REVIEWED: 06/11/15: Left knee x-ray: Narrowing of the medial joint space.  PHYSICAL EXAM:  VS: BP:(!) 183/102  HR: bpm  TEMP: ( )  RESP:   HT:5\' 9"  (175.3 cm)   WT:256 lb (116.1 kg)  BMI:37.9 PHYSICAL EXAM: Gen: NAD, alert, cooperative with exam,  HEENT: clear conjunctiva, EOMI CV:  no edema, capillary refill brisk,  Resp: non-labored, normal speech Skin: no rashes, normal turgor  Neuro: no gross deficits.  Psych:  alert and oriented Knee: Normal to inspection with no erythema or effusion or obvious bony abnormalities. Palpation normal with no warmth,  patellar tenderness, or condyle tenderness. Pain with palpation of the medial joint line of the left knee. No tenderness to palpation of the medial lateral joint line of the right knee. ROM full in flexion and extension and lower leg rotation. Ligaments with solid consistent endpoints including ACL, LCL, MCL. Negative Mcmurray's Non painful patellar compression. Patellar and quadriceps tendons unremarkable. Hamstring and quadriceps strength is normal.  Neurovascularly intact   ASSESSMENT & PLAN:   Left knee pain Pain is most likely occurring secondary to arthritic changes as well as patellofemoral syndrome. He has a history of gout but no warmth or redness over the joint. No chondrocalcinosis on x-ray to suggest pseudogout. - Trying to avoid NSAIDs as he has uncontrolled hypertension and has trouble affording medications. - Provided home exercises for VMO strengthening. - Referral to orthopedics for the left knee pain. Consider viscous supplementation. - He'll follow-up as needed.

## 2015-12-09 ENCOUNTER — Telehealth: Payer: Self-pay | Admitting: *Deleted

## 2015-12-09 DIAGNOSIS — M25562 Pain in left knee: Secondary | ICD-10-CM

## 2015-12-09 NOTE — Telephone Encounter (Signed)
Referral placed.  Thanks, Archie Patten, MD [facility] Resident  12/09/2015, 2:12 PM

## 2015-12-09 NOTE — Telephone Encounter (Signed)
Patient states that when he was seen by sports medicine yesterday they referred him to [facility].  He has [employer] and this referral has to come from PCP office.  Will forward to Dr. Lorenso Courier who saw patient for this complaint.  Patient states that his number is not working right now and that the number he called on doesn't have VM.  I informed him that it would be a few days before referral would be processed and that he can call [facility] next week to check on the status if he hasn't heard anything due to his phone issues. Jean Alejos,CMA

## 2016-03-11 ENCOUNTER — Ambulatory Visit (INDEPENDENT_AMBULATORY_CARE_PROVIDER_SITE_OTHER): Payer: Medicaid Other | Admitting: Family Medicine

## 2016-03-11 ENCOUNTER — Encounter: Payer: Self-pay | Admitting: Family Medicine

## 2016-03-11 VITALS — BP 190/100 | HR 97 | Temp 98.6°F | Ht 69.0 in | Wt 251.0 lb

## 2016-03-11 DIAGNOSIS — E1165 Type 2 diabetes mellitus with hyperglycemia: Secondary | ICD-10-CM | POA: Diagnosis not present

## 2016-03-11 DIAGNOSIS — I10 Essential (primary) hypertension: Secondary | ICD-10-CM | POA: Diagnosis not present

## 2016-03-11 DIAGNOSIS — Z23 Encounter for immunization: Secondary | ICD-10-CM

## 2016-03-11 DIAGNOSIS — L918 Other hypertrophic disorders of the skin: Secondary | ICD-10-CM

## 2016-03-11 DIAGNOSIS — E1140 Type 2 diabetes mellitus with diabetic neuropathy, unspecified: Secondary | ICD-10-CM

## 2016-03-11 LAB — POCT GLYCOSYLATED HEMOGLOBIN (HGB A1C): Hemoglobin A1C: 8.5

## 2016-03-11 MED ORDER — AMLODIPINE BESYLATE 10 MG PO TABS: 10.0000 mg | ORAL_TABLET | Freq: Every day | ORAL | 11 refills | Status: DC

## 2016-03-11 MED ORDER — ROSUVASTATIN CALCIUM 20 MG PO TABS: 20.0000 mg | ORAL_TABLET | Freq: Every day | ORAL | 3 refills | Status: DC

## 2016-03-11 MED ORDER — METFORMIN HCL 1000 MG PO TABS: 1000.0000 mg | ORAL_TABLET | Freq: Two times a day (BID) | ORAL | 0 refills | Status: DC

## 2016-03-11 NOTE — Progress Notes (Signed)
Subjective: CC: skin tag removal HPI: Jacob [name] is a 60 y.o. male presenting to clinic today for procedure. Concerns today include:  Diabetes:  Has not been on any medications >6 months because he is unable to afford medications.  He notes that he was previously on medical assistance program but lost this funding after went on SSI.  Was previously on Metformin and Glucotrol.  No DM eye exam or foot exam in some time.  Endorses numbness and tingling in feet.  Endorses excessive thirst, polyuria.  Endorses blurry vision that improves with corrective lenses.  Hypertension Has not been on meds in > 6 months.  Previously on Lisinopril, Norvasc.  Denies headache, dizziness, visual changes, nausea, vomiting, chest pain, abdominal pain.  Reports shortness of breath with exertion.  Skin tags Patient reports that he has had skin tags on back, under arms and one on chest.  He denies bleeding.  Endorses pain because the ones under his arms get caught and irritated.  He denies any of the lesions growing in size, changing colors.  Denies history of skin cancer.  Denies Family history of skin cancer.  Denies anticoagulation/ antiplatelet medications.  Denies smoker.  FamHx and MedHx reviewed.  Please see EMR. Health Maintenance: Flu shot due, DM eye exam, DM foot exam, A1c due  ROS: Per HPI  Objective: Office vital signs reviewed. BP (!) 190/100   Pulse 97   Temp 98.6 F (37 C) (Oral)   Ht 5\' 9"  (1.753 m)   Wt 251 lb (113.9 kg)   SpO2 98%   BMI 37.07 kg/m   Physical Examination:  General: Awake, alert, well nourished, No acute distress Cardio: RRR, no murmurs, +2 pulses Pulm: CTAB, normal WOB on room air Skin: dry, intact, +irritated skin tag under right axilla  Results for orders placed or performed in visit on 03/11/16 (from the past 24 hour(s))  POCT glycosylated hemoglobin (Hb A1C)     Status: None   Collection Time: 03/11/16  2:11 PM  Result Value Ref Range   Hemoglobin  A1C 8.5    PROCEDURE NOTE: Skin tag removal via cryo Patient given informed consent, signed copy in the chart.  Appropriate time out taken.  3 areas under each axilla were treated with cryo.  The patient tolerated the procedure well.   Patient given post procedure instructions.   Assessment/ Plan: 60 y.o. male   1. Uncontrolled type 2 diabetes mellitus with diabetic neuropathy, unspecified long term insulin use status (Jacob [name]). A1c 8.5 - Discussed continued lifestyle modification - POCT glycosylated hemoglobin (Hb A1C) - metFORMIN (GLUCOPHAGE) 1000 MG tablet; Take 1 tablet (1,000 mg total) by mouth 2 (two) times daily with a meal.  Dispense: 60 tablet; Refill: 0 - Crestor 20mg  daily #90 - ASA 81 daily - Follow up in 2 weeks.  Will plan to perform foot exam at that time.  2. HYPERTENSION, BENIGN SYSTEMIC, uncontrolled.  No red flags.  Will start with Norvasc and see how BP does. - amLODipine (NORVASC) 10 MG tablet; Take 1 tablet (10 mg total) by mouth daily before breakfast.  Dispense: 30 tablet; Refill: 11 - Will plan to add ACE-I back in 2 weeks if still not at goal <140/90 - Discussed speaking with pharmacist for [facility] copay waiver  3. Skin tag.  Tolerated cryo well.   - Home care instructions provided  4. Need for vaccination - Flu shot administered  Follow up in 2 weeks.  Janora Norlander, DO PGY-3, [facility]

## 2016-03-11 NOTE — Patient Instructions (Addendum)
I recommend you follow up with me in 2 weeks for diabetes and blood pressure.  We are going to restart you on medications today.  Cryosurgery for Skin Conditions, Care After Refer to this sheet in the next few weeks. These instructions provide you with information on caring for yourself after your procedure. Your health care provider may also give you more specific instructions. Your treatment has been planned according to current medical practices, but problems sometimes occur. Call your health care provider if you have any problems or questions after your procedure. WHAT TO EXPECT AFTER THE PROCEDURE After your procedure, it is typical to have the following:  The treated area will become red and swollen shortly after the procedure.  Within 2-3 days, a blister will form over the treated area. The blister may contain a small amount of blood.  In about 2 weeks, the blister will break on its own, leaving a scab. The treated area will then heal. After healing, there is usually little or no scarring. HOME CARE INSTRUCTIONS   Keep the treated area clean, dry, and covered with a bandage until healed. The area can be cleaned as usual with soap and water.  You may take showers. If your bandage gets wet, change it right away.  Do not pick at your blister or try to break it open. This can cause infection and scarring.  Do not apply any medicine, cream, or lotion to the treated area unless directed to do so by your health care provider. SEEK MEDICAL CARE IF:   You have increased pain, swelling, redness, fluid drainage, or bleeding in the treated area.  Your blister becomes large and painful.   This information is not intended to replace advice given to you by your health care provider. Make sure you discuss any questions you have with your health care provider.   Document Released: 11/05/2004 Document Revised: 12/19/2012 Document Reviewed: 11/16/2012 Elsevier Interactive Patient Education [employer]

## 2016-04-11 ENCOUNTER — Other Ambulatory Visit: Payer: Self-pay | Admitting: Family Medicine

## 2016-04-11 DIAGNOSIS — E1140 Type 2 diabetes mellitus with diabetic neuropathy, unspecified: Secondary | ICD-10-CM

## 2016-04-11 DIAGNOSIS — E1165 Type 2 diabetes mellitus with hyperglycemia: Principal | ICD-10-CM

## 2016-04-12 NOTE — Telephone Encounter (Signed)
Please have patient schedule follow up for DM2 and HTN.

## 2016-04-12 NOTE — Telephone Encounter (Signed)
Called patient, no answer. Will try to call again.Left message for patient to call office back. If patient does call please schedule a follow up for Diabetes and Hypertension per PCP. Nat Christen, CMA

## 2016-06-08 ENCOUNTER — Emergency Department (HOSPITAL_COMMUNITY): Payer: Medicaid Other

## 2016-06-08 ENCOUNTER — Other Ambulatory Visit: Payer: Self-pay

## 2016-06-08 ENCOUNTER — Inpatient Hospital Stay (HOSPITAL_COMMUNITY)
Admission: EM | Admit: 2016-06-08 | Discharge: 2016-06-11 | DRG: 418 | Disposition: A | Discharge status: Home / Self Care | Payer: Medicaid Other | Attending: Family Medicine | Admitting: Family Medicine

## 2016-06-08 ENCOUNTER — Encounter (HOSPITAL_COMMUNITY): Payer: Self-pay | Admitting: *Deleted

## 2016-06-08 DIAGNOSIS — Z6835 Body mass index (BMI) 35.0-35.9, adult: Secondary | ICD-10-CM

## 2016-06-08 DIAGNOSIS — R52 Pain, unspecified: Secondary | ICD-10-CM

## 2016-06-08 DIAGNOSIS — K8050 Calculus of bile duct without cholangitis or cholecystitis without obstruction: Secondary | ICD-10-CM

## 2016-06-08 DIAGNOSIS — E785 Hyperlipidemia, unspecified: Secondary | ICD-10-CM | POA: Diagnosis present

## 2016-06-08 DIAGNOSIS — G4733 Obstructive sleep apnea (adult) (pediatric): Secondary | ICD-10-CM | POA: Diagnosis present

## 2016-06-08 DIAGNOSIS — K8000 Calculus of gallbladder with acute cholecystitis without obstruction: Principal | ICD-10-CM | POA: Diagnosis present

## 2016-06-08 DIAGNOSIS — I249 Acute ischemic heart disease, unspecified: Secondary | ICD-10-CM | POA: Diagnosis present

## 2016-06-08 DIAGNOSIS — E1165 Type 2 diabetes mellitus with hyperglycemia: Secondary | ICD-10-CM | POA: Diagnosis present

## 2016-06-08 DIAGNOSIS — E782 Mixed hyperlipidemia: Secondary | ICD-10-CM | POA: Diagnosis present

## 2016-06-08 DIAGNOSIS — I471 Supraventricular tachycardia: Secondary | ICD-10-CM | POA: Diagnosis present

## 2016-06-08 DIAGNOSIS — K8010 Calculus of gallbladder with chronic cholecystitis without obstruction: Secondary | ICD-10-CM | POA: Diagnosis present

## 2016-06-08 DIAGNOSIS — K760 Fatty (change of) liver, not elsewhere classified: Secondary | ICD-10-CM | POA: Diagnosis present

## 2016-06-08 DIAGNOSIS — IMO0001 Reserved for inherently not codable concepts without codable children: Secondary | ICD-10-CM | POA: Diagnosis present

## 2016-06-08 DIAGNOSIS — Z01818 Encounter for other preprocedural examination: Secondary | ICD-10-CM

## 2016-06-08 DIAGNOSIS — R1011 Right upper quadrant pain: Secondary | ICD-10-CM

## 2016-06-08 DIAGNOSIS — E1149 Type 2 diabetes mellitus with other diabetic neurological complication: Secondary | ICD-10-CM | POA: Diagnosis present

## 2016-06-08 DIAGNOSIS — E1140 Type 2 diabetes mellitus with diabetic neuropathy, unspecified: Secondary | ICD-10-CM

## 2016-06-08 DIAGNOSIS — I10 Essential (primary) hypertension: Secondary | ICD-10-CM | POA: Diagnosis present

## 2016-06-08 DIAGNOSIS — R0789 Other chest pain: Secondary | ICD-10-CM | POA: Diagnosis present

## 2016-06-08 DIAGNOSIS — E669 Obesity, unspecified: Secondary | ICD-10-CM | POA: Diagnosis present

## 2016-06-08 DIAGNOSIS — Z79899 Other long term (current) drug therapy: Secondary | ICD-10-CM

## 2016-06-08 DIAGNOSIS — Z791 Long term (current) use of non-steroidal anti-inflammatories (NSAID): Secondary | ICD-10-CM

## 2016-06-08 DIAGNOSIS — Z7982 Long term (current) use of aspirin: Secondary | ICD-10-CM

## 2016-06-08 DIAGNOSIS — R55 Syncope and collapse: Secondary | ICD-10-CM

## 2016-06-08 DIAGNOSIS — Z9111 Patient's noncompliance with dietary regimen: Secondary | ICD-10-CM

## 2016-06-08 DIAGNOSIS — IMO0002 Reserved for concepts with insufficient information to code with codable children: Secondary | ICD-10-CM

## 2016-06-08 DIAGNOSIS — Z7984 Long term (current) use of oral hypoglycemic drugs: Secondary | ICD-10-CM

## 2016-06-08 DIAGNOSIS — K8020 Calculus of gallbladder without cholecystitis without obstruction: Secondary | ICD-10-CM

## 2016-06-08 HISTORY — DX: Gout, unspecified: M10.9

## 2016-06-08 HISTORY — DX: Polyp of colon: K63.5

## 2016-06-08 HISTORY — DX: Reserved for inherently not codable concepts without codable children: IMO0001

## 2016-06-08 HISTORY — DX: Type 2 diabetes mellitus without complications: E11.9

## 2016-06-08 HISTORY — DX: Disorders of diaphragm: J98.6

## 2016-06-08 HISTORY — DX: Frequency of micturition: R35.0

## 2016-06-08 HISTORY — DX: Syncope and collapse: R55

## 2016-06-08 HISTORY — DX: Mixed hyperlipidemia: E78.2

## 2016-06-08 HISTORY — DX: Male erectile dysfunction, unspecified: N52.9

## 2016-06-08 HISTORY — DX: Pure hypercholesterolemia, unspecified: E78.00

## 2016-06-08 HISTORY — DX: Personal history of other diseases of the musculoskeletal system and connective tissue: Z87.39

## 2016-06-08 HISTORY — DX: Right upper quadrant pain: R10.11

## 2016-06-08 LAB — COMPREHENSIVE METABOLIC PANEL
ALBUMIN: 3.3 g/dL — AB (ref 3.5–5.0)
ALK PHOS: 72 U/L (ref 38–126)
ALK PHOS: 72 U/L (ref 38–126)
ALT: 16 U/L — AB (ref 17–63)
ALT: 15 U/L — ABNORMAL LOW (ref 17–63)
ANION GAP: 9 (ref 5–15)
AST: 22 U/L (ref 15–41)
AST: 18 U/L (ref 15–41)
Albumin: 3.4 g/dL — ABNORMAL LOW (ref 3.5–5.0)
Anion gap: 9 (ref 5–15)
BUN: 9 mg/dL (ref 6–20)
BUN: 7 mg/dL (ref 6–20)
CALCIUM: 8.7 mg/dL — AB (ref 8.9–10.3)
CO2: 27 mmol/L (ref 22–32)
CO2: 26 mmol/L (ref 22–32)
CREATININE: 0.95 mg/dL (ref 0.61–1.24)
Calcium: 9.3 mg/dL (ref 8.9–10.3)
Chloride: 101 mmol/L (ref 101–111)
Chloride: 100 mmol/L — ABNORMAL LOW (ref 101–111)
Creatinine, Ser: 0.8 mg/dL (ref 0.61–1.24)
GFR calc Af Amer: >60 mL/min (ref >60)
GFR calc non Af Amer: >60 mL/min (ref >60)
GLUCOSE: 295 mg/dL — AB (ref 65–99)
Glucose, Bld: 223 mg/dL — ABNORMAL HIGH (ref 65–99)
POTASSIUM: 3.6 mmol/L (ref 3.5–5.1)
Potassium: 4.1 mmol/L (ref 3.5–5.1)
SODIUM: 135 mmol/L (ref 135–145)
Sodium: 137 mmol/L (ref 135–145)
TOTAL PROTEIN: 7.1 g/dL (ref 6.5–8.1)
Total Bilirubin: 0.7 mg/dL (ref 0.3–1.2)
Total Bilirubin: 0.6 mg/dL (ref 0.3–1.2)
Total Protein: 7.2 g/dL (ref 6.5–8.1)

## 2016-06-08 LAB — CBC
HCT: 39.1 % (ref 39.0–52.0)
Hemoglobin: 13.5 g/dL (ref 13.0–17.0)
MCH: 28.1 pg (ref 26.0–34.0)
MCHC: 34.5 g/dL (ref 30.0–36.0)
MCV: 81.5 fL (ref 78.0–100.0)
Platelets: 199 10*3/uL (ref 150–400)
RBC: 4.8 MIL/uL (ref 4.22–5.81)
RDW: 12.9 % (ref 11.5–15.5)
WBC: 11.7 10*3/uL — ABNORMAL HIGH (ref 4.0–10.5)

## 2016-06-08 LAB — URINALYSIS, ROUTINE W REFLEX MICROSCOPIC
Bacteria, UA: NONE SEEN
Bilirubin Urine: NEGATIVE
Glucose, UA: >=500 mg/dL — AB
Hgb urine dipstick: NEGATIVE
Ketones, ur: NEGATIVE mg/dL
Leukocytes, UA: NEGATIVE
Nitrite: NEGATIVE
Protein, ur: 100 mg/dL — AB
Specific Gravity, Urine: 1.021 (ref 1.005–1.030)
Squamous Epithelial / LPF: NONE SEEN
pH: 5 (ref 5.0–8.0)

## 2016-06-08 LAB — I-STAT TROPONIN, ED: Troponin i, poc: 0 ng/mL (ref 0.00–0.08)

## 2016-06-08 LAB — LIPASE, BLOOD: Lipase: 26 U/L (ref 11–51)

## 2016-06-08 LAB — POC OCCULT BLOOD, ED: Fecal Occult Bld: NEGATIVE

## 2016-06-08 LAB — TROPONIN I: Troponin I: <0.03 ng/mL (ref <0.03)

## 2016-06-08 LAB — GLUCOSE, CAPILLARY: Glucose-Capillary: 243 mg/dL — ABNORMAL HIGH (ref 65–99)

## 2016-06-08 MED ORDER — MORPHINE SULFATE (PF) 4 MG/ML IV SOLN
2.0000 mg | INTRAVENOUS | Status: DC | PRN
  Administered 2016-06-09: 2 mg via INTRAVENOUS
  Filled 2016-06-08 (×2): qty 1

## 2016-06-08 MED ORDER — ASPIRIN 325 MG PO TABS
325.0000 mg | ORAL_TABLET | Freq: Every day | ORAL | Status: DC
  Administered 2016-06-08: 325 mg via ORAL
  Filled 2016-06-08: qty 1

## 2016-06-08 MED ORDER — SODIUM CHLORIDE 0.9% FLUSH
3.0000 mL | Freq: Two times a day (BID) | INTRAVENOUS | Status: DC
  Administered 2016-06-08 – 2016-06-10 (×4): 3 mL via INTRAVENOUS

## 2016-06-08 MED ORDER — LISINOPRIL 10 MG PO TABS
10.0000 mg | ORAL_TABLET | Freq: Every day | ORAL | Status: DC
  Administered 2016-06-08 – 2016-06-11 (×2): 10 mg via ORAL
  Filled 2016-06-08 (×2): qty 1

## 2016-06-08 MED ORDER — NITROGLYCERIN 0.4 MG SL SUBL: 0.4000 mg | SUBLINGUAL_TABLET | SUBLINGUAL | Status: DC | PRN

## 2016-06-08 MED ORDER — ACETAMINOPHEN 325 MG PO TABS: 650.0000 mg | ORAL_TABLET | ORAL | Status: DC | PRN

## 2016-06-08 MED ORDER — SODIUM CHLORIDE 0.9% FLUSH: 3.0000 mL | INTRAVENOUS | Status: DC | PRN

## 2016-06-08 MED ORDER — ONDANSETRON HCL 4 MG/2ML IJ SOLN
4.0000 mg | Freq: Four times a day (QID) | INTRAMUSCULAR | Status: DC | PRN
  Administered 2016-06-09: 4 mg via INTRAVENOUS
  Filled 2016-06-08: qty 2

## 2016-06-08 MED ORDER — SODIUM CHLORIDE 0.9 % IV BOLUS (SEPSIS)
1000.0000 mL | Freq: Once | INTRAVENOUS | Status: AC
  Administered 2016-06-08: 1000 mL via INTRAVENOUS

## 2016-06-08 MED ORDER — ONDANSETRON HCL 4 MG/2ML IJ SOLN
4.0000 mg | Freq: Once | INTRAMUSCULAR | Status: AC
  Administered 2016-06-08: 4 mg via INTRAVENOUS
  Filled 2016-06-08: qty 2

## 2016-06-08 MED ORDER — ROSUVASTATIN CALCIUM 10 MG PO TABS
20.0000 mg | ORAL_TABLET | Freq: Every day | ORAL | Status: DC
  Administered 2016-06-08 – 2016-06-10 (×3): 20 mg via ORAL
  Filled 2016-06-08: qty 1
  Filled 2016-06-08 (×2): qty 2
  Filled 2016-06-08: qty 1

## 2016-06-08 MED ORDER — INSULIN ASPART 100 UNIT/ML ~~LOC~~ SOLN
0.0000 [IU] | Freq: Three times a day (TID) | SUBCUTANEOUS | Status: DC
  Administered 2016-06-09 (×2): 3 [IU] via SUBCUTANEOUS
  Administered 2016-06-10 – 2016-06-11 (×3): 2 [IU] via SUBCUTANEOUS

## 2016-06-08 MED ORDER — ENOXAPARIN SODIUM 40 MG/0.4ML ~~LOC~~ SOLN
40.0000 mg | SUBCUTANEOUS | Status: DC
  Administered 2016-06-08 – 2016-06-10 (×3): 40 mg via SUBCUTANEOUS
  Filled 2016-06-08 (×3): qty 0.4

## 2016-06-08 MED ORDER — HYDROMORPHONE HCL 2 MG/ML IJ SOLN
0.5000 mg | Freq: Once | INTRAMUSCULAR | Status: AC
  Administered 2016-06-08: 0.5 mg via INTRAVENOUS
  Filled 2016-06-08: qty 1

## 2016-06-08 MED ORDER — SODIUM CHLORIDE 0.9 % IV BOLUS (SEPSIS)
1000.0000 mL | Freq: Once | INTRAVENOUS | Status: AC
Start: 2016-06-08 — End: 2016-06-08
  Administered 2016-06-08: 1000 mL via INTRAVENOUS

## 2016-06-08 MED ORDER — SODIUM CHLORIDE 0.9 % IV SOLN: 250.0000 mL | INTRAVENOUS | Status: DC | PRN

## 2016-06-08 MED ORDER — AMLODIPINE BESYLATE 10 MG PO TABS
10.0000 mg | ORAL_TABLET | Freq: Every day | ORAL | Status: DC
  Administered 2016-06-11: 10 mg via ORAL
  Filled 2016-06-08: qty 1

## 2016-06-08 NOTE — ED Provider Notes (Signed)
[facility] DEPT Provider Note   CSN: [id number] Arrival date & time: 06/08/16  0944     History   Chief Complaint Chief Complaint  Patient presents with  . Abdominal Pain    HPI   Blood pressure 147/92, pulse 86, temperature 98.3 F (36.8 C), temperature source Oral, resp. rate 18, SpO2 98 %.  Jacob [name] is a 61 y.o. male past medical history significant for non-insulin-dependent diabetes, hypertension complaining of severe, 10 out of 10 right upper quadrant pain with several episodes of nonbloody, nonbilious, non-coffee-ground emesis and tactile fever over the last 3 days. He states that he's had constipation with very firm, small and dark stool. He doesn't drink alcohol, he has been taking NSAIDs for pain relief. He endorses a left-sided squeezing chest pain intermittently over the last several months he had an episode while walking into the emergency department today, is in it is associated with diaphoresis but no shortness of breath. Patient had a syncopal episode while blood work was being drawn, he felt warm and had tunnel vision. No other syncopal events at home. He states he is eating and drinking normally.   PCP: Family practice  Past Medical History:  Diagnosis Date  . Arthritis 08-10-11   1 month ago-ankles/feet  . Chronic cough 08-10-11   more than 6 yrs from sinus drainage  . Depression 08-10-11   depression(due to childhood issues)  . Diabetes mellitus 08-10-11   tx. oral meds.  . Hypertension 08-10-11   tx. meds  . Kidney stones 08-10-11   once in past, none recent  . Methylprednisolone-induced hypoxia 08/23/2011  . Neuromuscular disorder ([place]) 08-10-11   "burning/tingling sensation In feet"  . Obesity   . OSA (obstructive sleep apnea) 08/23/2011  . Shortness of breath 08-10-11   with increased activity only  . Umbilical hernia     Patient Active Problem List   Diagnosis Date Noted  . Left knee pain 12/08/2015  . Mass on back 04/09/2015  . Myalgia  03/19/2014  . Erectile dysfunction 08/23/2013  . Dental infection 06/19/2013  . Skin lesion 06/19/2013  . Ventral hernia with obstruction 09/09/2011  . 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 OSA (obstructive sleep apnea) 08/23/2011  . Colonic polyp 08/04/2011  . Cataract 12/01/2010  . Chest pain 07/15/2010  . Type II diabetes mellitus with neurological manifestations, uncontrolled ([place]) 10/03/2008  . HYPERLIPIDEMIA 02/19/2007  . Gout, unspecified 06/29/2006  . OBESITY, NOS 06/29/2006  . DEPRESSION, MAJOR, RECURRENT 06/29/2006  . ANXIETY 06/29/2006  . HYPERTENSION, BENIGN SYSTEMIC 06/29/2006    Past Surgical History:  Procedure Laterality Date  . CATARACT EXTRACTION  08-10-11   rt. eye 2-3 months ago.  Marland Kitchen COLONOSCOPY W/ POLYPECTOMY  08-10-11   1 month ago  . VENTRAL HERNIA REPAIR  08/22/2011   Procedure: HERNIA REPAIR VENTRAL ADULT;  Surgeon: Adin Hector, MD;  Location: [facility];  Service: General;  Laterality: N/A;  incarcerated       Home Medications    Prior to Admission medications   Medication Sig Start Date End Date Taking? Authorizing Provider  albuterol (PROVENTIL HFA;VENTOLIN HFA) 108 (90 BASE) MCG/ACT inhaler Inhale 2 puffs into the lungs every 6 (six) hours as needed for wheezing. 06/04/14   Aquilla Hacker, MD  amLODipine (NORVASC) 10 MG tablet Take 1 tablet (10 mg total) by mouth daily before breakfast. 03/11/16   Janora Norlander, DO  aspirin 325 MG tablet Take 325 mg by mouth daily.    Historical Provider, MD  Blood Glucose Monitoring Suppl (ACCU-CHEK AVIVA PLUS) W/DEVICE KIT 1 application by Does not apply route daily. 04/08/15   York Ram Melancon, MD  fluticasone (FLONASE) 50 MCG/ACT nasal spray Place 2 sprays into the nose daily as needed. Allergies 02/07/12   Carolin Guernsey, MD  glipiZIDE (GLUCOTROL) 10 MG tablet Take 1 tablet (10 mg total) by mouth 2 (two) times daily. 06/03/15   Aquilla Hacker, MD  glucose blood test strip Use as instructed 05/15/13   Waldemar Dickens, MD  lisinopril (PRINIVIL,ZESTRIL) 10 MG tablet Take 1 tablet (10 mg total) by mouth daily. 06/03/15   Aquilla Hacker, MD  meloxicam (MOBIC) 15 MG tablet Take 1 tablet (15 mg total) by mouth daily. 11/26/15   Archie Patten, MD  metFORMIN (GLUCOPHAGE) 1000 MG tablet TAKE 1 TABLET BY MOUTH TWICE A DAY WITH MEALS 04/12/16   Janora Norlander, DO  rosuvastatin (CRESTOR) 20 MG tablet Take 1 tablet (20 mg total) by mouth daily. 03/11/16   Janora Norlander, DO    Family History Family History  Problem Relation Age of Onset  . Hyperlipidemia Mother   . Diabetes Mother   . Heart disease Father   . Hyperlipidemia Father   . Colon cancer Neg Hx     Social History Social History  Substance Use Topics  . Smoking status: Never Smoker  . Smokeless tobacco: Never Used  . Alcohol use No     Allergies   Methylprednisolone acetate   Review of Systems Review of Systems  10 systems reviewed and found to be negative, except as noted in the HPI.   Physical Exam Updated Vital Signs BP 146/93   Pulse 74   Temp 98.3 F (36.8 C) (Oral)   Resp 15   SpO2 94%   Physical Exam  Constitutional: [name] oriented to person, place, and time. He appears well-developed and well-nourished. No distress.  HENT:  Head: Normocephalic and atraumatic.  Mouth/Throat: Oropharynx is clear and moist.  Eyes: Conjunctivae and EOM are normal. Pupils are equal, round, and reactive to light.  Neck: Normal range of motion.  Cardiovascular: Normal rate, regular rhythm and intact distal pulses.   Pulmonary/Chest: Effort normal and breath sounds normal.  Abdominal: Soft. There is tenderness.  Murphy sign positive, is also mildly tender to palpation in the right lower quadrant with no guarding or rebound, Rovsing, psoas and obturator are negative.  Musculoskeletal: Normal range of motion.  Neurological: [name] alert and oriented to person, place, and time.  Skin: Capillary refill takes less than 2 seconds.  [name] not diaphoretic.  Lipoma on right thoracic back with no significant tenderness or overlying skin changes.  Psychiatric:  Pt tearful (upset because  sad because his mother told him that he deserved his abdominal pain)  Nursing note and vitals reviewed.    ED Treatments / Results  Labs (all labs ordered are listed, but only abnormal results are displayed) Labs Reviewed  COMPREHENSIVE METABOLIC PANEL - Abnormal; Notable for the following:       Result Value   Glucose, Bld 295 (*)    Albumin 3.3 (*)    ALT 16 (*)    All other components within normal limits  CBC - Abnormal; Notable for the following:    WBC 11.7 (*)    All other components within normal limits  URINALYSIS, ROUTINE W REFLEX MICROSCOPIC - Abnormal; Notable for the following:    APPearance HAZY (*)    Glucose,  UA >=500 (*)    Protein, ur 100 (*)    All other components within normal limits  CULTURE, BLOOD (ROUTINE X 2)  CULTURE, BLOOD (ROUTINE X 2)  LIPASE, BLOOD  I-STAT TROPOININ, ED  POC OCCULT BLOOD, ED  POC OCCULT BLOOD, ED    EKG  EKG Interpretation  Date/Time:  Wednesday June 08 2016 13:54:15 EST Ventricular Rate:  76 PR Interval:    QRS Duration: 102 QT Interval:  378 QTC Calculation: 425 R Axis:   42 Text Interpretation:  Sinus rhythm Abnormal R-wave progression, early transition Confirmed by Lita Mains  MD, DAVID ([id number]) on 06/08/2016 2:44:10 PM       Radiology Dg Abdomen Acute W/chest  Result Date: 06/08/2016 CLINICAL DATA:  Right-sided abdominal pain radiating to the back over the last 5 days. EXAM: DG ABDOMEN ACUTE W/ 1V CHEST COMPARISON:  Ultrasound same day.  Chest radiography 08/25/2011. FINDINGS: Chest shows normal heart size. Mild tortuosity of the aorta. The right lung is clear. Chronic elevation of the left hemidiaphragm with mild chronic scarring at the left base. Two view abdominal exam shows a moderate amount of fecal matter but no evidence of ileus, obstruction or free air.  Multiple gallstones noted in the right upper quadrant. Ordinary lower lumbar degenerative changes. IMPRESSION: Multiple small gallstones. Gas pattern unremarkable, with a moderate amount of fecal matter but no sign of ileus or obstruction. Chronic elevation of the left hemidiaphragm with left base scarring. Electronically Signed   By: Nelson Chimes M.D.   On: 06/08/2016 13:32   US Abdomen Limited Ruq  Result Date: 06/08/2016 CLINICAL DATA:  Severe right upper quadrant pain since last night. EXAM: US ABDOMEN LIMITED - RIGHT UPPER QUADRANT COMPARISON:  None. FINDINGS: Gallbladder: Multiple shadowing gallstones measuring up to 1.2 cm in size. No gallbladder wall thickening or pericholecystic fluid. Negative sonographic Murphy sign per the sonographer. Common bile duct: Diameter: 5.5 mm Liver: Diffusely increased echogenicity suggestive of steatosis with 4 cm mildly ill-defined region of decreased echogenicity adjacent to the gallbladder fossa most compatible with fatty sparing. IMPRESSION: 1. Cholelithiasis without evidence of acute cholecystitis. 2. Hepatic steatosis. Electronically Signed   By: Logan Bores M.D.   On: 06/08/2016 13:24    Procedures Procedures (including critical care time)  Medications Ordered in ED Medications  sodium chloride 0.9 % bolus 1,000 mL (not administered)  HYDROmorphone (DILAUDID) injection 0.5 mg (not administered)  ondansetron (ZOFRAN) injection 4 mg (not administered)  sodium chloride 0.9 % bolus 1,000 mL (not administered)     Initial Impression / Assessment and Plan / ED Course  I have reviewed the triage vital signs and the nursing notes.  Pertinent labs & imaging results that were available during my care of the patient were reviewed by me and considered in my medical decision making (see chart for details).     Vitals:   06/08/16 1400 06/08/16 1415 06/08/16 1445 06/08/16 1500  BP: 133/84 122/90 138/93 146/93  Pulse: 75 75 74 74  Resp: 16 14 (!) 9 15    Temp:      TempSrc:      SpO2: 97% 93% 94% 94%    Medications  sodium chloride 0.9 % bolus 1,000 mL (not administered)  HYDROmorphone (DILAUDID) injection 0.5 mg (not administered)  ondansetron (ZOFRAN) injection 4 mg (not administered)  sodium chloride 0.9 % bolus 1,000 mL (not administered)    DEJAY KRONK is 61 y.o. male presenting with Persistent right upper quadrant pain with subjective fever and  multiple episodes of emesis worsening over the last several days. Patient is tender in the right upper quadrant, afebrile overall well appearing. He had a syncopal episode while blood was being drawn, likely vasovagal. EKG with no significant acute abnormality. Troponin negative, blood work reassuring with mild leukocytosis.  Gen. surgery consult from [facility] appreciated: States that this patient will need to have troponin cycled and they will consult for planned HIDA scan.   Guaiac negative.  Family practice Dr. Burr Medico accepts admission with attending McDiarmid.      Final Clinical Impressions(s) / ED Diagnoses   Final diagnoses:  Pain  Syncope and collapse  Biliary colic    New Prescriptions New Prescriptions   No medications on file     Monico Blitz, PA-C 06/08/16 1536    Julianne Rice, MD 06/14/16 1553

## 2016-06-08 NOTE — Consult Note (Signed)
Reason for Consult:  RUQ pain Referring Physician: Monico Blitz, PA-C PCP:  Ronnie Doss, DO    Jacob [name] is an 61 y.o. male.    HPI: Pt presents with RUQ pain and increased urination.  Pain was worse with movement.  He had an episode of syncope in the ED with LOC for about 2 min.  He described 10/10 pain RUQ with several epsodes of emesis over the last 3 days, He also has some constipation.  He does take NSAID sfor pain relief, + for left sided chest pain with diaphoresis.   Work up shows some hypertension, he is afebrile, VSS.  WBC is 11.7, other wise normal CBC.  CMP shows glucose 295, lipase 26, normal LFT's.  1st troponin is normal. UA is negative.  Acute abdomen and chest film shows:Multiple small gallstones. Gas pattern unremarkable, with a moderate amount of fecal matter but no sign of ileus or obstruction. Chronic elevation of the left hemidiaphragm with left base scarring. Ultrasoound of the abd:  Cholelithiasis without evidence of acute cholecystitis.  Hepatic steatosis.  We are ask to see.   Past Medical History:  Diagnosis Date  . Arthritis 08-10-11   1 month ago-ankles/feet  . Chronic cough 08-10-11   more than 6 yrs from sinus drainage  . Depression 08-10-11   depression(due to childhood issues)  . Diabetes mellitus 08-10-11   tx. oral meds.  Does not follow it at home.  Marland Kitchen Hypertension 08-10-11   tx. meds  . Kidney stones 08-10-11   once in past, none recent  . Methylprednisolone-induced hypoxia 08/23/2011  . Neuromuscular disorder ([place]) 08-10-11   "burning/tingling sensation In feet"  . Obesity   . OSA (obstructive sleep apnea) 08/23/2011  . Shortness of breath 08-10-11   with increased activity only  . Umbilical hernia     Past Surgical History:  Procedure Laterality Date  . CATARACT EXTRACTION  08-10-11   rt. eye 2-3 months ago.  Marland Kitchen COLONOSCOPY W/ POLYPECTOMY  08-10-11   1 month ago  . VENTRAL HERNIA REPAIR  08/22/2011   Procedure: HERNIA REPAIR  VENTRAL ADULT;  Surgeon: Adin Hector, MD;  Location: [facility];  Service: General;  Laterality: N/A;  incarcerated    Family History  Problem Relation Age of Onset  . Hyperlipidemia Mother   . Diabetes Mother   . Heart disease Father   . Hyperlipidemia Father   . Colon cancer Neg Hx     Social History:  reports that he has never smoked. He has never used smokeless tobacco. He reports that he does not drink alcohol or use drugs.  Allergies:  Allergies  Allergen Reactions  . Methylprednisolone Acetate     Respiratory failure   Prior to Admission medications   Medication Sig Start Date End Date Taking? Authorizing Provider  albuterol (PROVENTIL HFA;VENTOLIN HFA) 108 (90 BASE) MCG/ACT inhaler Inhale 2 puffs into the lungs every 6 (six) hours as needed for wheezing. 06/04/14   Aquilla Hacker, MD  amLODipine (NORVASC) 10 MG tablet Take 1 tablet (10 mg total) by mouth daily before breakfast. 03/11/16   Janora Norlander, DO  aspirin 325 MG tablet Take 325 mg by mouth daily.    Historical Provider, MD  Blood Glucose Monitoring Suppl (ACCU-CHEK AVIVA PLUS) W/DEVICE KIT 1 application by Does not apply route daily. 04/08/15   York Ram Melancon, MD  fluticasone (FLONASE) 50 MCG/ACT nasal spray Place 2 sprays into the nose daily as needed. Allergies 02/07/12  Carolin Guernsey, MD  glipiZIDE (GLUCOTROL) 10 MG tablet Take 1 tablet (10 mg total) by mouth 2 (two) times daily. 06/03/15   Aquilla Hacker, MD  glucose blood test strip Use as instructed 05/15/13   Waldemar Dickens, MD  lisinopril (PRINIVIL,ZESTRIL) 10 MG tablet Take 1 tablet (10 mg total) by mouth daily. 06/03/15   Aquilla Hacker, MD  meloxicam (MOBIC) 15 MG tablet Take 1 tablet (15 mg total) by mouth daily. 11/26/15   Archie Patten, MD  metFORMIN (GLUCOPHAGE) 1000 MG tablet TAKE 1 TABLET BY MOUTH TWICE A DAY WITH MEALS 04/12/16   Janora Norlander, DO  rosuvastatin (CRESTOR) 20 MG tablet Take 1 tablet (20 mg total) by mouth daily.  03/11/16   Janora Norlander, DO     Results for orders placed or performed during the hospital encounter of 06/08/16 (from the past 48 hour(s))  Lipase, blood     Status: None   Collection Time: 06/08/16 11:15 AM  Result Value Ref Range   Lipase 26 11 - 51 U/L  Comprehensive metabolic panel     Status: Abnormal   Collection Time: 06/08/16 11:15 AM  Result Value Ref Range   Sodium 137 135 - 145 mmol/L   Potassium 4.1 3.5 - 5.1 mmol/L    Comment: SLIGHT HEMOLYSIS   Chloride 101 101 - 111 mmol/L   CO2 27 22 - 32 mmol/L   Glucose, Bld 295 (H) 65 - 99 mg/dL   BUN 9 6 - 20 mg/dL   Creatinine, Ser 0.95 0.61 - 1.24 mg/dL   Calcium 9.3 8.9 - 10.3 mg/dL   Total Protein 7.1 6.5 - 8.1 g/dL   Albumin 3.3 (L) 3.5 - 5.0 g/dL   AST 22 15 - 41 U/L   ALT 16 (L) 17 - 63 U/L   Alkaline Phosphatase 72 38 - 126 U/L   Total Bilirubin 0.7 0.3 - 1.2 mg/dL   GFR calc non Af Amer >60 >60 mL/min   GFR calc Af Amer >60 >60 mL/min    Comment: (NOTE) The eGFR has been calculated using the CKD EPI equation. This calculation has not been validated in all clinical situations. eGFR's persistently <60 mL/min signify possible Chronic Kidney Disease.    Anion gap 9 5 - 15  CBC     Status: Abnormal   Collection Time: 06/08/16 11:15 AM  Result Value Ref Range   WBC 11.7 (H) 4.0 - 10.5 K/uL   RBC 4.80 4.22 - 5.81 MIL/uL   Hemoglobin 13.5 13.0 - 17.0 g/dL   HCT 39.1 39.0 - 52.0 %   MCV 81.5 78.0 - 100.0 fL   MCH 28.1 26.0 - 34.0 pg   MCHC 34.5 30.0 - 36.0 g/dL   RDW 12.9 11.5 - 15.5 %   Platelets 199 150 - 400 K/uL  Urinalysis, Routine w reflex microscopic     Status: Abnormal   Collection Time: 06/08/16  1:48 PM  Result Value Ref Range   Color, Urine YELLOW YELLOW   APPearance HAZY (A) CLEAR   Specific Gravity, Urine 1.021 1.005 - 1.030   pH 5.0 5.0 - 8.0   Glucose, UA >=500 (A) NEGATIVE mg/dL   Hgb urine dipstick NEGATIVE NEGATIVE   Bilirubin Urine NEGATIVE NEGATIVE   Ketones, ur NEGATIVE  NEGATIVE mg/dL   Protein, ur 100 (A) NEGATIVE mg/dL   Nitrite NEGATIVE NEGATIVE   Leukocytes, UA NEGATIVE NEGATIVE   RBC / HPF 0-5 0 - 5 RBC/hpf  WBC, UA 0-5 0 - 5 WBC/hpf   Bacteria, UA NONE SEEN NONE SEEN   Squamous Epithelial / LPF NONE SEEN NONE SEEN   Mucous PRESENT   I-stat troponin, ED     Status: None   Collection Time: 06/08/16  2:10 PM  Result Value Ref Range   Troponin i, poc 0.00 0.00 - 0.08 ng/mL   Comment 3            Comment: Due to the release kinetics of cTnI, a negative result within the first hours of the onset of symptoms does not rule out myocardial infarction with certainty. If myocardial infarction is still suspected, repeat the test at appropriate intervals.     Dg Abdomen Acute W/chest  Result Date: 06/08/2016 CLINICAL DATA:  Right-sided abdominal pain radiating to the back over the last 5 days. EXAM: DG ABDOMEN ACUTE W/ 1V CHEST COMPARISON:  Ultrasound same day.  Chest radiography 08/25/2011. FINDINGS: Chest shows normal heart size. Mild tortuosity of the aorta. The right lung is clear. Chronic elevation of the left hemidiaphragm with mild chronic scarring at the left base. Two view abdominal exam shows a moderate amount of fecal matter but no evidence of ileus, obstruction or free air. Multiple gallstones noted in the right upper quadrant. Ordinary lower lumbar degenerative changes. IMPRESSION: Multiple small gallstones. Gas pattern unremarkable, with a moderate amount of fecal matter but no sign of ileus or obstruction. Chronic elevation of the left hemidiaphragm with left base scarring. Electronically Signed   By: Nelson Chimes M.D.   On: 06/08/2016 13:32   US Abdomen Limited Ruq  Result Date: 06/08/2016 CLINICAL DATA:  Severe right upper quadrant pain since last night. EXAM: US ABDOMEN LIMITED - RIGHT UPPER QUADRANT COMPARISON:  None. FINDINGS: Gallbladder: Multiple shadowing gallstones measuring up to 1.2 cm in size. No gallbladder wall thickening or  pericholecystic fluid. Negative sonographic Murphy sign per the sonographer. Common bile duct: Diameter: 5.5 mm Liver: Diffusely increased echogenicity suggestive of steatosis with 4 cm mildly ill-defined region of decreased echogenicity adjacent to the gallbladder fossa most compatible with fatty sparing. IMPRESSION: 1. Cholelithiasis without evidence of acute cholecystitis. 2. Hepatic steatosis. Electronically Signed   By: Logan Bores M.D.   On: 06/08/2016 13:24    Review of Systems  Constitutional: Positive for chills. Negative for diaphoresis, fever (never took temprature), malaise/fatigue and weight loss.  HENT: Negative.   Eyes: Negative.   Respiratory: Positive for cough and shortness of breath (DOE 50 feet with chest pain also). Negative for hemoptysis and sputum production.   Cardiovascular: Positive for chest pain (he gets short of breath with this and it occurs about every other day.), orthopnea and PND.  Gastrointestinal: Positive for abdominal pain, constipation, heartburn, nausea and vomiting. Negative for blood in stool, diarrhea and melena.  Genitourinary: Negative for frequency, hematuria and urgency.       BPH like slow and poor stream.  Up q1h at night to void  Musculoskeletal: Positive for back pain (he's vague about where it is. appears to be on the right side..  he also has some chronic back pain he cannot localize.  Take Meloxicam for this.).  Skin: Negative.   Neurological: Positive for loss of consciousness and headaches. Negative for weakness.       Pain and numbness both feet  Episode of syncope about 1 year ago also.  Endo/Heme/Allergies: Negative.   Psychiatric/Behavioral: Positive for depression.       On disability for some time due to  depression and anxiety issues.     Blood pressure 147/92, pulse 86, temperature 98.3 F (36.8 C), temperature source Oral, resp. rate 18, SpO2 98 %. Physical Exam  Constitutional: [name] oriented to person, place, and time. He  appears well-developed and well-nourished.  Obese WM no acute distress, very anxious and tearful at times during the interview.    HENT:  Head: Normocephalic and atraumatic.  Mouth/Throat: No oropharyngeal exudate.  Eyes: Right eye exhibits no discharge. Left eye exhibits no discharge. No scleral icterus.  Neck: Normal range of motion. Neck supple. No JVD present. No tracheal deviation present. No thyromegaly present.  Cardiovascular: Normal rate, regular rhythm, normal heart sounds and intact distal pulses.   No murmur heard. Respiratory: Effort normal and breath sounds normal. No respiratory distress. He has no wheezes. He has no rales. He exhibits no tenderness.  GI: Soft. Bowel sounds are normal. He exhibits no distension (Mostly RUQ) and no mass. There is tenderness. There is no rebound and no guarding.  Musculoskeletal: He exhibits edema (trace RLE). He exhibits no tenderness or deformity.  Complains of numbness and tingling both lower feet  Lymphadenopathy:    He has no cervical adenopathy.  Neurological: [name] alert and oriented to person, place, and time. No cranial nerve deficit.  Skin: Skin is warm and dry. No rash noted. No erythema. No pallor.  Psychiatric: His behavior is normal. Judgment and thought content normal.  Very anxious and tearful after getting pain meds.      Assessment/Plan: RUQ abdominal pain with cholelithiasis/normal lipase and LFT's Syncope in ED with blood draw Chest pain with SOB DOE Chronic back pain Sleep apnea hx, never tested, refused CPAP Hypertension Obesity BMI 37.8 AODM poor control Diabetic neuropathy both feet by Hx. Depression - on disability for this NSAID USE- not certain how much Prior umbilical hernia repair with Mesh   Plan:  Medicine admit, rule out cardiac issues, GI issues.  Clearance for surgery.  He can have clears tonight and the NPO after MN.  We will get a HIDA scan in the AM and if positive plan surgery when cleared by  Medicine.  I will order the HIDA scan for AM, he just got dilaudid this PM.   Recheck labs in AM.      [name],Jacob 06/08/2016, 2:51 PM

## 2016-06-08 NOTE — ED Triage Notes (Signed)
Pt states that he has had RUQ abdominal pain and increased urination. Pt states that pain is worse with movement.

## 2016-06-08 NOTE — ED Triage Notes (Signed)
Pt had a syncopal episode in triage. Pt became diaphoretic and lost consciousness for approx 2 minutes. Pt alert and oriented now. EKG done and blood work drawn. IV started in triage as well.

## 2016-06-08 NOTE — H&P (Signed)
[facility] Admission History and Physical Service Pager: [phone]  Patient name: Jacob [name] Medical record number: [id number] Date of birth: February 15, 1956 Age: 61 y.o. Gender: male  Primary Care Provider: Ronnie Doss, DO Consultants: None Code Status: FULL confirmed this admission  Chief Complaint: RUQ pain  Assessment and Plan: Jacob [name] is a 61 y.o. male presenting with RUQ pain, found to have cholelithiasis, also with chest pain. PMH is significant for T2DM, HTN, HLD  RUQ Pain - most concerning ddx item would be acute cholecystitis (though none found on imaging). Patient has RUQ pain, leukocytosis to 11.7 present with subjective fevers (afebrile at admission). RUQ U/S showed multiple gall stones, no GB wall thickening or pericholecystic fluid, negative sonographic murphy sign.  Abdominal XR shows gallstones, no ileus or obstruction.  Lipase 26, WNL to think of pancreatitis. FOBT was negative and Hgb stable. Patient was seen by surgery in the ED and HIDA was ordered, ACS r/o and cardiac clearance required prior to intervention. - Admit to FPTS attending Dr. McDiarmid - monitor on telemetry - pain control with 2 mg morphine q6h PRN, acetaminophen - nausea control with zofran PRN - tolerating PO, hold off IVF for now - HIDA - blood cultures pending - Gen surg consulting, appreciate recs  Chest tightness - Concerning for cardiac etiology given associated dyspnea on exertion and PND. Troponins negative x1 in the ED. EKG unchanged from previous. Heart score 2 points (low). - cycle troponin - repeat EKG in AM - cardiac echo - risk strat labs - HbA1c, lipid panel  - Consider AM cardiology consult for pre-op clearance  HLD - lipid profile from 2016 Chol 161, HDL 19, Triglycerides 559. Home regimen includes ASA 325, Rosuvostatin 20 mg - ordered lipid panel - Continue home statin and ASA   T2DM - Last last HbA1c 8.5 in 03/2016. Home regimen  includes metformin 1000 mg BID. Glipizide noted in the computer but patient indicates he is no longer taking this. - hold metformin - SSI - HbA1c ordered  HTN - controlled on admission. Home regimen amlodipine 10 mg qd at home , lisinopril 10 mg qd at home. - Continue home BP regimen  LE neuropathy, stable - hold home meloxicam for now  FEN/GI: Cardiac/Carb modified diet, NPO at midnight incase needs surgery Prophylaxis: lovenox  Disposition: Admit to FPTS, telemetry  History of Present Illness:  Jacob [name] is a 61 y.o. male presenting with RUQ pain and chest tightness. RUQ pain of 10/10 severity that came on suddenly 4 days ago, worse when bends to the side or lays on his right side. [name] unsure what he was doing when pain started. Pain radiates to mid back. No previous episodes. Pain is not worse with eating. Associated symptoms include NBNB vomiting for 4 days, 7-8 episodes over the past 24 hours. No diarrhea. Subjective fevers . No sick contacts.  He did receive a flu shot this year. + congestion, + urinary frequency, no burning with urination or hematuria. He has been able to stay hydrated by mouth, states that he has been drinking plenty of water.  Patient also endorses 6 months of intermittent mid-sternal chest pressure described as "mild", no pain, not worse with ambulation, not better with rest, not associated with eating. He has dyspnea walking to the mailbox and back. He describes PND, wakes up in the middle of the night with suffocating feeling, and sit up to breath, has to walk around to get air in. No  associated diaphoresis. No known family history of heart disease. [name] a never smoker. He does not drink alcohol.    Notably the patient got bit by a ferrel cat 1 week ago, bit in two places on his hand. Endorses swelling in the days after the bite which has now resolved. Did not seek medical care at the time of the bites. Poured alcohol over the bites after they  occurred.   Review Of Systems: Per HPI.  ROS  Patient Active Problem List   Diagnosis Date Noted  . Left knee pain 12/08/2015  . Mass on back 04/09/2015  . Myalgia 03/19/2014  . Erectile dysfunction 08/23/2013  . Dental infection 06/19/2013  . Skin lesion 06/19/2013  . Ventral hernia with obstruction 09/09/2011  . 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 OSA (obstructive sleep apnea) 08/23/2011  . Colonic polyp 08/04/2011  . Cataract 12/01/2010  . Chest pain 07/15/2010  . Type II diabetes mellitus with neurological manifestations, uncontrolled ([place]) 10/03/2008  . HYPERLIPIDEMIA 02/19/2007  . Gout, unspecified 06/29/2006  . OBESITY, NOS 06/29/2006  . DEPRESSION, MAJOR, RECURRENT 06/29/2006  . ANXIETY 06/29/2006  . HYPERTENSION, BENIGN SYSTEMIC 06/29/2006    Past Medical History: Past Medical History:  Diagnosis Date  . Arthritis 08-10-11   1 month ago-ankles/feet  . Chronic cough 08-10-11   more than 6 yrs from sinus drainage  . Depression 08-10-11   depression(due to childhood issues)  . Diabetes mellitus 08-10-11   tx. oral meds.  . Hypertension 08-10-11   tx. meds  . Kidney stones 08-10-11   once in past, none recent  . Methylprednisolone-induced hypoxia 08/23/2011  . Neuromuscular disorder ([place]) 08-10-11   "burning/tingling sensation In feet"  . Obesity   . OSA (obstructive sleep apnea) 08/23/2011  . Shortness of breath 08-10-11   with increased activity only  . Umbilical hernia     Past Surgical History: Past Surgical History:  Procedure Laterality Date  . CATARACT EXTRACTION  08-10-11   rt. eye 2-3 months ago.  Marland Kitchen COLONOSCOPY W/ POLYPECTOMY  08-10-11   1 month ago  . VENTRAL HERNIA REPAIR  08/22/2011   Procedure: HERNIA REPAIR VENTRAL ADULT;  Surgeon: Adin Hector, MD;  Location: [facility];  Service: General;  Laterality: N/A;  incarcerated    Social History: Social History  Substance Use Topics  . Smoking status: Never Smoker  . Smokeless tobacco: Never Used  .  Alcohol use No   Additional social history:  Please also refer to relevant sections of EMR.  Family History: Family History  Problem Relation Age of Onset  . Hyperlipidemia Mother   . Diabetes Mother   . Heart disease Father   . Hyperlipidemia Father   . Colon cancer Neg Hx     Allergies and Medications: Allergies  Allergen Reactions  . Methylprednisolone Acetate     Respiratory failure   No current facility-administered medications on file prior to encounter.    Current Outpatient Prescriptions on File Prior to Encounter  Medication Sig Dispense Refill  . albuterol (PROVENTIL HFA;VENTOLIN HFA) 108 (90 BASE) MCG/ACT inhaler Inhale 2 puffs into the lungs every 6 (six) hours as needed for wheezing. 2 Inhaler 4  . amLODipine (NORVASC) 10 MG tablet Take 1 tablet (10 mg total) by mouth daily before breakfast. 30 tablet 11  . aspirin 325 MG tablet Take 325 mg by mouth daily.    Marland Kitchen lisinopril (PRINIVIL,ZESTRIL) 10 MG tablet Take 1 tablet (10 mg total) by mouth daily. 90 tablet 3  .  metFORMIN (GLUCOPHAGE) 1000 MG tablet TAKE 1 TABLET BY MOUTH TWICE A DAY WITH MEALS 60 tablet 0  . Blood Glucose Monitoring Suppl (ACCU-CHEK AVIVA PLUS) W/DEVICE KIT 1 application by Does not apply route daily. 1 kit 0  . fluticasone (FLONASE) 50 MCG/ACT nasal spray Place 2 sprays into the nose daily as needed. Allergies (Patient not taking: Reported on 06/08/2016) 16 g 3  . glipiZIDE (GLUCOTROL) 10 MG tablet Take 1 tablet (10 mg total) by mouth 2 (two) times daily. (Patient not taking: Reported on 06/08/2016) 180 tablet 3  . glucose blood test strip Use as instructed 100 each 12  . meloxicam (MOBIC) 15 MG tablet Take 1 tablet (15 mg total) by mouth daily. (Patient not taking: Reported on 06/08/2016) 30 tablet 0  . rosuvastatin (CRESTOR) 20 MG tablet Take 1 tablet (20 mg total) by mouth daily. (Patient not taking: Reported on 06/08/2016) 90 tablet 3    Objective: BP 146/93   Pulse 74   Temp 98.3 F (36.8 C)  (Oral)   Resp 15   SpO2 94%  Exam: General: NAD, rests comfortably in bed, pleasant  Eyes: PERRL, EOMI, no conjunctival injection or pallor, no scleral icterus ENTM: no pharyngeal erythema or exudate, tonsils mildly enlarged but not kissing, no rhinorrhea Neck: full ROM, no cervical lymphadenopathy Cardiovascular: RRR, no m/r/g Respiratory: CTA bil, no W/R/R Gastrointestinal: obese abdomen, normoactive BS, mildly tender to deep palpation in RUQ and RLQ, negative murphy sign, negative rosving sign MSK: moves 4 extremities equally Derm: +back lipoma (chronic), +two small scabs noted right hand c/w cat bite without erythema or exudate, no drainage Neuro: CN II-XII grossly intact Psych: AAOx3, appropriate affect  Labs and Imaging: CBC BMET   Recent Labs Lab 06/08/16 1115  WBC 11.7*  HGB 13.5  HCT 39.1  PLT 199    Recent Labs Lab 06/08/16 1115  NA 137  K 4.1  CL 101  CO2 27  BUN 9  CREATININE 0.95  GLUCOSE 295*  CALCIUM 9.3      Troponin 0 EKG normal sinus rhythm, nonischemic   EXAM: US ABDOMEN LIMITED - RIGHT UPPER QUADRANT 06/08/2016  FINDINGS: Gallbladder: Multiple shadowing gallstones measuring up to 1.2 cm in size. No gallbladder wall thickening or pericholecystic fluid. Negative sonographic Murphy sign per the sonographer. Common bile duct: Diameter: 5.5 mm Liver: Diffusely increased echogenicity suggestive of steatosis with 4 cm mildly ill-defined region of decreased echogenicity adjacent to the gallbladder fossa most compatible with fatty sparing.  IMPRESSION: 1. Cholelithiasis without evidence of acute cholecystitis. 2. Hepatic steatosis.   DG ABDOMEN ACUTE W/ 1V CHEST FINDINGS: Chest shows normal heart size. Mild tortuosity of the aorta. The right lung is clear. Chronic elevation of the left hemidiaphragm with mild chronic scarring at the left base.  Two view abdominal exam shows a moderate amount of fecal matter but no evidence of ileus,  obstruction or free air. Multiple gallstones noted in the right upper quadrant. Ordinary lower lumbar degenerative changes.  IMPRESSION:  - Multiple small gallstones. - Gas pattern unremarkable, with a moderate amount of fecal matter but no sign of ileus or obstruction. - Chronic elevation of the left hemidiaphragm with left base scarring.  Everrett Coombe, MD 06/08/2016, 5:52 PM PGY-1, [place] [facility] pager: [phone], text pages welcome  Upper Level Addendum:  I have seen and evaluated this patient along with Dr. Burr Medico and reviewed the above note, making necessary revisions in pink.  Virginia Crews, MD, MPH PGY-3,  [place] 06/08/2016 8:51 PM

## 2016-06-08 NOTE — ED Notes (Signed)
Pt made aware of bed assignment 

## 2016-06-09 ENCOUNTER — Encounter (HOSPITAL_COMMUNITY): Payer: Self-pay | Admitting: Family Medicine

## 2016-06-09 ENCOUNTER — Observation Stay (HOSPITAL_COMMUNITY): Payer: Medicaid Other

## 2016-06-09 DIAGNOSIS — R0789 Other chest pain: Secondary | ICD-10-CM | POA: Diagnosis present

## 2016-06-09 DIAGNOSIS — Z79899 Other long term (current) drug therapy: Secondary | ICD-10-CM | POA: Diagnosis not present

## 2016-06-09 DIAGNOSIS — G4733 Obstructive sleep apnea (adult) (pediatric): Secondary | ICD-10-CM

## 2016-06-09 DIAGNOSIS — Z6835 Body mass index (BMI) 35.0-35.9, adult: Secondary | ICD-10-CM | POA: Diagnosis not present

## 2016-06-09 DIAGNOSIS — Z7984 Long term (current) use of oral hypoglycemic drugs: Secondary | ICD-10-CM | POA: Diagnosis not present

## 2016-06-09 DIAGNOSIS — K8050 Calculus of bile duct without cholangitis or cholecystitis without obstruction: Secondary | ICD-10-CM | POA: Diagnosis not present

## 2016-06-09 DIAGNOSIS — Z9111 Patient's noncompliance with dietary regimen: Secondary | ICD-10-CM | POA: Diagnosis not present

## 2016-06-09 DIAGNOSIS — I10 Essential (primary) hypertension: Secondary | ICD-10-CM

## 2016-06-09 DIAGNOSIS — I471 Supraventricular tachycardia: Secondary | ICD-10-CM | POA: Diagnosis present

## 2016-06-09 DIAGNOSIS — E1149 Type 2 diabetes mellitus with other diabetic neurological complication: Secondary | ICD-10-CM | POA: Diagnosis present

## 2016-06-09 DIAGNOSIS — E1165 Type 2 diabetes mellitus with hyperglycemia: Secondary | ICD-10-CM

## 2016-06-09 DIAGNOSIS — IMO0002 Reserved for concepts with insufficient information to code with codable children: Secondary | ICD-10-CM

## 2016-06-09 DIAGNOSIS — R1011 Right upper quadrant pain: Secondary | ICD-10-CM | POA: Diagnosis present

## 2016-06-09 DIAGNOSIS — R55 Syncope and collapse: Secondary | ICD-10-CM | POA: Diagnosis present

## 2016-06-09 DIAGNOSIS — Z7982 Long term (current) use of aspirin: Secondary | ICD-10-CM | POA: Diagnosis not present

## 2016-06-09 DIAGNOSIS — Z791 Long term (current) use of non-steroidal anti-inflammatories (NSAID): Secondary | ICD-10-CM | POA: Diagnosis not present

## 2016-06-09 DIAGNOSIS — I249 Acute ischemic heart disease, unspecified: Secondary | ICD-10-CM | POA: Diagnosis not present

## 2016-06-09 DIAGNOSIS — E785 Hyperlipidemia, unspecified: Secondary | ICD-10-CM | POA: Diagnosis present

## 2016-06-09 DIAGNOSIS — K8000 Calculus of gallbladder with acute cholecystitis without obstruction: Principal | ICD-10-CM

## 2016-06-09 DIAGNOSIS — K760 Fatty (change of) liver, not elsewhere classified: Secondary | ICD-10-CM | POA: Diagnosis present

## 2016-06-09 DIAGNOSIS — E669 Obesity, unspecified: Secondary | ICD-10-CM

## 2016-06-09 DIAGNOSIS — Z01818 Encounter for other preprocedural examination: Secondary | ICD-10-CM

## 2016-06-09 DIAGNOSIS — K8020 Calculus of gallbladder without cholecystitis without obstruction: Secondary | ICD-10-CM | POA: Diagnosis not present

## 2016-06-09 DIAGNOSIS — E782 Mixed hyperlipidemia: Secondary | ICD-10-CM | POA: Diagnosis not present

## 2016-06-09 DIAGNOSIS — E1140 Type 2 diabetes mellitus with diabetic neuropathy, unspecified: Secondary | ICD-10-CM | POA: Diagnosis not present

## 2016-06-09 DIAGNOSIS — K8010 Calculus of gallbladder with chronic cholecystitis without obstruction: Secondary | ICD-10-CM | POA: Diagnosis present

## 2016-06-09 HISTORY — DX: Right upper quadrant pain: R10.11

## 2016-06-09 HISTORY — DX: Syncope and collapse: R55

## 2016-06-09 LAB — BASIC METABOLIC PANEL
Anion gap: 9 (ref 5–15)
BUN: 8 mg/dL (ref 6–20)
CALCIUM: 8.9 mg/dL (ref 8.9–10.3)
CO2: 27 mmol/L (ref 22–32)
CREATININE: 0.86 mg/dL (ref 0.61–1.24)
Chloride: 102 mmol/L (ref 101–111)
GFR calc Af Amer: >60 mL/min (ref >60)
GFR calc non Af Amer: >60 mL/min (ref >60)
Glucose, Bld: 239 mg/dL — ABNORMAL HIGH (ref 65–99)
Potassium: 4.1 mmol/L (ref 3.5–5.1)
SODIUM: 138 mmol/L (ref 135–145)

## 2016-06-09 LAB — CBC
HCT: 39.3 % (ref 39.0–52.0)
Hemoglobin: 13 g/dL (ref 13.0–17.0)
MCH: 27.3 pg (ref 26.0–34.0)
MCHC: 33.1 g/dL (ref 30.0–36.0)
MCV: 82.6 fL (ref 78.0–100.0)
PLATELETS: 175 10*3/uL (ref 150–400)
RBC: 4.76 MIL/uL (ref 4.22–5.81)
RDW: 13 % (ref 11.5–15.5)
WBC: 10.1 10*3/uL (ref 4.0–10.5)

## 2016-06-09 LAB — LIPID PANEL
Cholesterol: 81 mg/dL (ref 0–200)
HDL: 18 mg/dL — ABNORMAL LOW (ref >40)
LDL Cholesterol: 15 mg/dL (ref 0–99)
Total CHOL/HDL Ratio: 4.5 RATIO
Triglycerides: 240 mg/dL — ABNORMAL HIGH (ref <150)
VLDL: 48 mg/dL — AB (ref 0–40)

## 2016-06-09 LAB — LIPASE, BLOOD: LIPASE: 26 U/L (ref 11–51)

## 2016-06-09 LAB — GLUCOSE, CAPILLARY
GLUCOSE-CAPILLARY: 227 mg/dL — AB (ref 65–99)
GLUCOSE-CAPILLARY: 173 mg/dL — AB (ref 65–99)
Glucose-Capillary: 244 mg/dL — ABNORMAL HIGH (ref 65–99)

## 2016-06-09 LAB — SURGICAL PCR SCREEN
MRSA, PCR: NEGATIVE
STAPHYLOCOCCUS AUREUS: POSITIVE — AB

## 2016-06-09 LAB — TROPONIN I
Troponin I: <0.03 ng/mL (ref <0.03)
Troponin I: <0.03 ng/mL (ref <0.03)

## 2016-06-09 MED ORDER — TECHNETIUM TC 99M MEBROFENIN IV KIT
5.2000 | PACK | Freq: Once | INTRAVENOUS | Status: AC | PRN
  Administered 2016-06-09: 5.2 via INTRAVENOUS

## 2016-06-09 MED ORDER — ASPIRIN EC 81 MG PO TBEC
81.0000 mg | DELAYED_RELEASE_TABLET | Freq: Every day | ORAL | Status: DC
  Administered 2016-06-11: 81 mg via ORAL
  Filled 2016-06-09: qty 1

## 2016-06-09 NOTE — Progress Notes (Signed)
Pt returned from Nuclear Med at this time. VSS.  pt c.o pain rght side below rib cage at this time. Pain med given. Will continue to monitor.  Ave Filter, RN

## 2016-06-09 NOTE — Consult Note (Addendum)
Cardiology Consult    Patient ID: Jacob [name] MRN: [id number], DOB/AGE: 1955-06-11   Admit date: 06/08/2016 Date of Consult: 06/09/2016  Primary Physician: Ronnie Doss, DO Reason for Consult: Pre operative evaluation Primary Cardiologist: New Requesting Provider: Dr McDiarmid   History of Present Illness    Jacob [name] is a 61 y.o. male with past medical history significant for Type 2 DM, HTN, HLD and obesity who presented to the [facility] ED for evaluation of LUQ pain and also had chest pain.   Pt has had an uncomfortable "clamping" type central chest discomfort, non radiating and not related to activity, lasts about 20 minutres for at least 6 months. He has chronic DOE, like walking to his amilbox, for many years. He has no orthopnea, PND, palpitations, or dizziness. He has OSA and was prescribed CPAP but did not tolerate. He has no history of cardiac events. Per his report he had a stress test many years ago that was normal. He does not smoke or drink alcohol.  Troponins negative X3 EKG Sinus rhythm 76 bpm, early r wave progression CXR Chest shows normal heart size. Mild tortuosity of the aorta. The right lung is clear. Chronic elevation of the left hemidiaphragm with mild chronic scarring at the left base.  Last echo 06/2004: This was a technically difficult study. Grade I diastolic dysfunction. Overall left ventricular systolic function was at the lower limits of normal. Left ventricular ejection fraction was estimated , range being 50 % to 55 %. There was possible hypokinesis of the inferior wall based on limited views. There was mild tricuspid valvular regurgitation.  Past Medical History   Past Medical History:  Diagnosis Date  . Arthritis    "hurt qwhere in my bones; especially early in the morning" (06/08/2016)  . Chronic cough 08-10-11   more than 6 yrs from sinus drainage  . 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   . Depression    depression(due to childhood issues)  . 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 Erectile dysfunction 08/23/2013  . Gout, unspecified 06/29/2006   Qualifier: Diagnosis of  By: Herma Ard    . High cholesterol   . History of gout   . HYPERLIPIDEMIA 02/19/2007   Qualifier: Diagnosis of  By: Darylene Price MD, Elta [place]    . Hypertension 08-10-11   tx. meds  . Increased urinary frequency   . Kidney stones 08-10-11   once in past, none recent  . Methylprednisolone-induced hypoxia 08/23/2011  . Neuromuscular disorder ([place]) 08-10-11   "burning/tingling sensation In feet"  . Obesity   . Obesity, Class II, BMI 35-39.9, with comorbidity 06/29/2006  . OSA (obstructive sleep apnea)    "couldn't deal w/CPAP" (06/08/2016)  . RUQ pain 06/09/2016  . Shortness of breath 08-10-11   with increased activity only  . Syncope and collapse 06/09/2016  . Syncope and collapse 06/09/2016  . Type II diabetes mellitus ([place])   . Umbilical hernia     Past Surgical History:  Procedure Laterality Date  . CATARACT EXTRACTION W/ INTRAOCULAR LENS  IMPLANT, BILATERAL Bilateral 08/10/2011 - ~ 2014   "right - left"  . COLONOSCOPY W/ BIOPSIES AND POLYPECTOMY  2013  . HERNIA REPAIR    . VENTRAL HERNIA REPAIR  08/22/2011   Procedure: HERNIA REPAIR VENTRAL ADULT;  Surgeon: Adin Hector, MD;  Location: [facility];  Service: General;  Laterality: N/A;  incarcerated     Allergies  Allergies  Allergen Reactions  . Methylprednisolone Acetate     Respiratory failure    Inpatient Medications    . amLODipine  10 mg Oral QAC breakfast  . aspirin  325 mg Oral Daily  . enoxaparin (LOVENOX) injection  40 mg Subcutaneous Q24H  . insulin aspart  0-9 Units Subcutaneous TID WC  .  lisinopril  10 mg Oral Daily  . rosuvastatin  20 mg Oral q1800  . sodium chloride flush  3 mL Intravenous Q12H    Family History    Family History  Problem Relation Age of Onset  . Hyperlipidemia Mother   . Diabetes Mother   . Heart disease Father   . Hyperlipidemia Father   . Colon cancer Neg Hx     Social History    Social History   Social History  . Marital status: Single    Spouse name: N/A  . Number of children: 0  . Years of education: N/A   Occupational History  . Unemployed    Social History Main Topics  . Smoking status: Never Smoker  . Smokeless tobacco: Never Used  . Alcohol use No  . Drug use: No  . Sexual activity: No   Other Topics Concern  . Not on file   Social History Narrative   Daily caffeine      Review of Systems    General:  No chills, fever, night sweats or weight changes.  Cardiovascular:  chest discomfort as above, mild dyspnea on exertion, No edema,no  orthopnea, no palpitations, no paroxysmal nocturnal dyspnea. Dermatological: No rash, lesions/masses, lipoma on back Respiratory: No cough, dyspnea Urologic: No hematuria, dysuria Abdominal:   No nausea, vomiting, diarrhea, bright red blood per rectum, melena, or hematemesis, RUQ pain Neurologic:  No visual changes, wkns, changes in mental status. All other systems reviewed and are otherwise negative except as noted above.  Physical Exam    Blood pressure 138/88, pulse 87, temperature 98 F (36.7 C), temperature source Oral, resp. rate 18, SpO2 96 %.  General: Pleasant, NAD Psych: Normal affect. Neuro: Alert and oriented X 3. Moves all extremities spontaneously. HEENT: Normal  Neck: Supple without bruits or JVD. Lungs:  Resp regular and unlabored, CTA. Heart: RRR no s3, s4, or murmurs. Abdomen: Tender RUQ, non-distended, BS + x 4.  Extremities: No clubbing, cyanosis or edema. DP/PT/Radials 2+ and equal bilaterally.  Labs    Troponin [facility] of Care Test)  Recent Labs   06/08/16 1410  TROPIPOC 0.00    Recent Labs  06/08/16 1921 06/08/16 2353 06/09/16 0542  TROPONINI <0.03 <0.03 <0.03   Lab Results  Component Value Date   WBC 10.1 06/09/2016   HGB 13.0 06/09/2016   HCT 39.3 06/09/2016   MCV 82.6 06/09/2016   PLT 175 06/09/2016    Recent Labs Lab 06/08/16 1921 06/09/16 0542  NA 135 138  K 3.6 4.1  CL 100* 102  CO2 26 27  BUN 7 8  CREATININE 0.80 0.86  CALCIUM 8.7* 8.9  PROT 7.2  --   BILITOT 0.6  --   ALKPHOS 72  --   ALT 15*  --   AST 18  --   GLUCOSE 223* 239*   Lab Results  Component Value Date   CHOL 81 06/09/2016   HDL 18 (L) 06/09/2016  [place] 15 06/09/2016   TRIG 240 (H) 06/09/2016   No results found for: [facility]   Radiology Studies    Nm Hepato W/eject Fract  Result Date: 06/09/2016 CLINICAL DATA:  Right upper quadrant pain, nausea, known gallstones EXAM: NUCLEAR MEDICINE HEPATOBILIARY IMAGING WITH GALLBLADDER EF TECHNIQUE: Sequential images of the abdomen were obtained out to 60 minutes following intravenous administration of radiopharmaceutical. After oral ingestion of Ensure, gallbladder ejection fraction was determined. At 60 min, normal ejection fraction is greater than 33%. RADIOPHARMACEUTICALS:  5.2 mCi Tc-21m  Choletec IV COMPARISON:  Ultrasound the abdomen of 06/08/2016 FINDINGS: Prompt uptake and biliary excretion of activity by the liver is seen. Gallbladder activity is visualized, consistent with patency of cystic duct. Biliary activity passes into small bowel, consistent with patent common bile duct. Calculated gallbladder ejection fraction is 80%. (Normal gallbladder ejection fraction with Ensure is greater than 33%.) IMPRESSION: 1. Normal nuclear medicine hepatobiliary scan. 2. Normal gallbladder ejection fraction of 80%. Some pain was noted with the administration of Ensure. Electronically Signed   By: Ivar Drape M.D.   On: 06/09/2016 10:34   Dg Abdomen Acute W/chest  Result Date: 06/08/2016 CLINICAL DATA:   Right-sided abdominal pain radiating to the back over the last 5 days. EXAM: DG ABDOMEN ACUTE W/ 1V CHEST COMPARISON:  Ultrasound same day.  Chest radiography 08/25/2011. FINDINGS: Chest shows normal heart size. Mild tortuosity of the aorta. The right lung is clear. Chronic elevation of the left hemidiaphragm with mild chronic scarring at the left base. Two view abdominal exam shows a moderate amount of fecal matter but no evidence of ileus, obstruction or free air. Multiple gallstones noted in the right upper quadrant. Ordinary lower lumbar degenerative changes. IMPRESSION: Multiple small gallstones. Gas pattern unremarkable, with a moderate amount of fecal matter but no sign of ileus or obstruction. Chronic elevation of the left hemidiaphragm with left base scarring. Electronically Signed   By: Nelson Chimes M.D.   On: 06/08/2016 13:32   US Abdomen Limited Ruq  Result Date: 06/08/2016 CLINICAL DATA:  Severe right upper quadrant pain since last night. EXAM: US ABDOMEN LIMITED - RIGHT UPPER QUADRANT COMPARISON:  None. FINDINGS: Gallbladder: Multiple shadowing gallstones measuring up to 1.2 cm in size. No gallbladder wall thickening or pericholecystic fluid. Negative sonographic Murphy sign per the sonographer. Common bile duct: Diameter: 5.5 mm Liver: Diffusely increased echogenicity suggestive of steatosis with 4 cm mildly ill-defined region of decreased echogenicity adjacent to the gallbladder fossa most compatible with fatty sparing. IMPRESSION: 1. Cholelithiasis without evidence of acute cholecystitis. 2. Hepatic steatosis. Electronically Signed   By: Logan Bores M.D.   On: 06/08/2016 13:24    EKG & Cardiac Imaging   EKG: Sinus rhythm 76 bpm, early r wave progression, no acute ischemic changes  Echocardiogram:   Last echo 06/2004: This was a technically difficult study. Grade I diastolic dysfunction. Overall left ventricular systolic function was at the lower limits of normal. Left ventricular  ejection fraction was estimated , range being 50 % to 55 %. There was possible hypokinesis of the inferior wall based on limited views. There was mild tricuspid valvular regurgitation.   Assessment & Plan    Atypical Chest pain -uncomfortable "clamping" type central chest discomfort, non radiating and not related to activity, lasts about 20 minutes for at least the past 6 months. Not typical anginal symptoms. -EKG without ischemic changes -Troponins negative X 3 -No previous cardiac events, remote normal stress test per pt report -Echo has been ordered.  Will check for wall motion and LV function -Patient is moderate risk due to poorly controlled diabetes, fair controlled HTN, and atypical chest pain with chronic DOE for this low risk endoscopic surgery.   HLD -Lipid panel 06/09/2016: LDL 15, triglycerides 240 (trig were 559 in 2016) -No longer taking crestor 20 mg  Diabetes Type II -Pt admits to constant dietary indiscretions -Hgb A1c 8.5 on 03/11/2016, was 11.6 in 04/2015 -Manage by IM  Hypertension -Continue home meds: lisinopril 10 mg, amlodipine 10 mg -Fair control  OSA -prescribed CPAP but has never wanted to use. Discussed benefits of treatment with pt.  Tildon Husky, NP-C 06/09/2016, 4:07 PM Pager: [phone]  Patient seen and examined. Agree with assessment and plan. Jacob [name] is a 61 year old Caucasian male who has a least a 10 year history of hypertension and a 6-7 year history of diabetes mellitus.  His diabetes mellitus has not been well controlled.  He has a history of significant mixed hyperlipidemia and previously it had triglycerides in excess of 500.  Remotely, he had been on Crestor but ultimately stopped taking this.  He denies any history of tobacco use or family history for premature CAD.  He has been found to have symptomatic gallstones.  He had a normal nuclear medicine hepatobiliary scan.  [name] under consideration for possible  cholecystectomy.  He denies any definitive exertional anginal symptomatology.  He has noticed a vague chest sensation which can occur any time and is typically not associated with activity but oftentimes more often occurs while he is sitting.  Remotely, he may have undergone a prior stress test.  He has a history of obesity and admits to dietary noncompliance with reference to optimal diabetic diet.  In the past he was told of having  sleep apnea but is not on CPAP therapy after only trying in a very short time and did not experiment with different mask types.  His ECG shows normal sinus rhythm at 71 bpm without ST segment changes and with normal intervals.  HCG echo Doppler study has been ordered but has not yet been done.  We will try to get this done as soon as possible.  He appears stable from a cardiovascular standpoint and he will be given clearance for surgical procedure long as his echo Doppler study does not show significant abnormality.  I would recommend postoperative telemetry and a postop ECG.  He had been on aspirin 325 mg at home, and I will change this to 81 mg.  I am not certain if his most recent lipid panel was correct and ultimately this will need to be repeated.  In addition, following hospitalization, the patient should be reevaluated for sleep apnea and I discussed with him the potential cardiovascular risks associated with untreated sleep apnea.  Troy Sine, MD, [facility] 06/09/2016 5:51 PM

## 2016-06-09 NOTE — Progress Notes (Signed)
Family Medicine Teaching Service Daily Progress Note Intern Pager: [phone]  Patient name: Jacob [name] Medical record number: [id number] Date of birth: 04-10-1956 Age: 61 y.o. Gender: male  Primary Care Provider: Ronnie Doss, DO Consultants: General Surgery, Cardiology Code Status: Full  Pt Overview and Major Events to Date:  02/07: Patient admitted for RUQ found to have cholelithiasis  Assessment and Plan: Jacob [name] is a 61 y.o. male presenting with RUQ pain, found to have cholelithiasis, also with chest pain. PMH is significant for T2DM, HTN, HLD  #Symptomatic cholelithiasis without cholecystitis: Acute, stable. Cholecystitis most concerning though imaging does not support this. Patient has RUQ pain, leukocytosis to 11.7. Remains afebrile. RUQ U/S showed multiple gallstones, no GB wall thickening or pericholecystic fluid, negative sonographic murphy sign.  AXR shows gallstones, no ileus or obstruction.  Lipase 26, pancreatitis unlikely. FOBT was negative and Hgb stable. Patient was seen by surgery in the ED and HIDA pending, ACS r/o and cardiac clearance required prior to intervention. --General surgery consult, appreciate recommendations --Cardiac monitoring --Morphine 2 mg q6h PRN --Tylenol PRN --Zofran PRN --Continue diet as tolerating, holding IVF --HIDA pending --Blood cultures pending  #Atypical chest tightness  Obstructive sleep apnea: Chronic, stable. Onset 6 months ago. Concerning for cardiac etiology given associated dyspnea on exertion, orthopnea, PND. Troponins negative. EKG unchanged from previous. HEART score 2. Patient has CPAP at home however not using. Could be related to OSA however heart failure of concern given history. --Echocardiogram pending --Cardiology consulted, appreciate recommendations, evaluating preop and possible undiagnosed heart failure  #Hyperlipidemia: Chronic, stable. Lipid profile from 2016 Chol 161, HDL 19, Triglycerides 559.  Home regimen includes ASA 325, Rosuvostatin 20 mg --Rosuvostatin 20 mg QD  --ASA 81 mg QD  #Non-insulin-dependent diabetes mellitus: Chronic, stable. Last last HbA1c 8.5 in 03/2016. Home regimen includes metformin 1000 mg BID. Glipizide noted in the computer but patient indicates he is no longer taking this. --Holding home metformin --Sensitive sliding scale --A1c pending  #Hypertension: Chronic, stable. Controlled on admission. Home regimen amlodipine 10 mg qd at home, lisinopril 10 mg qd at home. --Amlodipine 10 mg QD --Lisinopril 10 mg QD  #Lower extremity neuropathy: Chronic, stable.  --Hold home meloxicam for now  FEN/GI: Cardiac/Carb modified diet, NPO at midnight incase needs surgery Prophylaxis: Lovenox SQ  Disposition: Pending cholecystectomy and further cardiac workup.  Subjective:  Patient complaining of RUQ pain but denies nausea or vomiting. Denies SOB or CP at present but has been experiencing this on and off for over 6 months worse with exertion.  Objective: Temp:  [98.2 F (36.8 C)-98.5 F (36.9 C)] 98.2 F (36.8 C) (02/08 0545) Pulse Rate:  [74-92] 84 (02/08 0545) Resp:  [9-22] 18 (02/08 0545) BP: (122-163)/(81-94) 144/91 (02/08 0545) SpO2:  [88 %-99 %] 93 % (02/08 0545) Physical Exam: General: obese, well nourished, well developed, mild acute distress due to abdominal pain with non-toxic appearance HEENT: normocephalic, atraumatic, moist mucous membranes Neck: supple, non-tender without lymphadenopathy CV: regular rate and rhythm without murmurs, rubs, or gallops, no LE edema Lungs: clear to auscultation bilaterally with normal work of breathing Abdomen: soft, mildly tender with deep palpation w/o rebound or guarding, no masses or organomegaly palpable, normoactive bowel sounds Skin: warm, dry, no rashes or lesions, cap refill < 2 seconds Extremities: warm and well perfused, normal tone  Laboratory:  Recent Labs Lab 06/08/16 1115 06/09/16 0542   WBC 11.7* 10.1  HGB 13.5 13.0  HCT 39.1 39.3  PLT 199 175  Recent Labs Lab 06/08/16 1115 06/08/16 1921 06/09/16 0542  NA 137 135 138  K 4.1 3.6 4.1  CL 101 100* 102  CO2 27 26 27   BUN 9 7 8   CREATININE 0.95 0.80 0.86  CALCIUM 9.3 8.7* 8.9  PROT 7.1 7.2  --   BILITOT 0.7 0.6  --   ALKPHOS 72 72  --   ALT 16* 15*  --   AST 22 18  --   GLUCOSE 295* 223* 239*   Urinalysis    Component Value Date/Time   COLORURINE YELLOW 06/08/2016 1348   APPEARANCEUR HAZY (A) 06/08/2016 1348   LABSPEC 1.021 06/08/2016 1348   PHURINE 5.0 06/08/2016 1348   GLUCOSEU >=500 (A) 06/08/2016 1348   HGBUR NEGATIVE 06/08/2016 1348   BILIRUBINUR NEGATIVE 06/08/2016 1348   BILIRUBINUR NEG 04/08/2015 1021   KETONESUR NEGATIVE 06/08/2016 1348   PROTEINUR 100 (A) 06/08/2016 1348   UROBILINOGEN 0.2 04/08/2015 1021   UROBILINOGEN 0.2 08/10/2011 1534   NITRITE NEGATIVE 06/08/2016 1348   LEUKOCYTESUR NEGATIVE 06/08/2016 1348   Lipid Panel     Component Value Date/Time   CHOL 81 06/09/2016 0542   TRIG 240 (H) 06/09/2016 0542   HDL 18 (L) 06/09/2016 0542   CHOLHDL 4.5 06/09/2016 0542   VLDL 48 (H) 06/09/2016 0542   LDLCALC 15 06/09/2016 0542   LDLDIRECT 57 07/15/2010 2059   --Lipase: 26 (L) --Blood culture: Pending 2 --I-STAT troponin: Negative --Troponin-I: Negative 2 --FOBT: Negative --A1c: Pending  Imaging/Diagnostic Tests: NM Hepato W/EjeCT Fract (06/09/2016) Pending  DG Abdomen Acute W/Chest (06/08/2016) FINDINGS: Chest shows normal heart size. Mild tortuosity of the aorta. The right lung is clear. Chronic elevation of the left hemidiaphragm with mild chronic scarring at the left base. Two view abdominal exam shows a moderate amount of fecal matter but no evidence of ileus, obstruction or free air. Multiple gallstones noted in the right upper quadrant. Ordinary lower lumbar degenerative changes.  IMPRESSION: Multiple small gallstones. Gas pattern unremarkable, with a moderate  amount of fecal matter but no sign of ileus or obstruction. Chronic elevation of the left hemidiaphragm with left base scarring.  US Abdomen Limited RUQ (06/08/2016) FINDINGS: Gallbladder: Multiple shadowing gallstones measuring up to 1.2 cm in size. No gallbladder wall thickening or pericholecystic fluid. Negative sonographic Murphy sign per the sonographer.  Common bile duct: Diameter: 5.5 mm  Liver: Diffusely increased echogenicity suggestive of steatosis with 4 cm mildly ill-defined region of decreased echogenicity adjacent to the gallbladder fossa most compatible with fatty sparing.  IMPRESSION: 1. Cholelithiasis without evidence of acute cholecystitis. 2. Hepatic steatosis.    Watkins Bing, DO 06/09/2016, 8:03 AM PGY-1, [place] Intern pager: [phone], text pages welcome

## 2016-06-09 NOTE — Progress Notes (Signed)
RUQ pain  Subjective: Pt continues to have ruq pain, worse during NM exam this am.    Objective: Vital signs in last 24 hours: Temp:  [98 F (36.7 C)-98.5 F (36.9 C)] 98 F (36.7 C) (02/08 1027) Pulse Rate:  [74-92] 87 (02/08 1027) Resp:  [9-22] 18 (02/08 1027) BP: (122-163)/(81-94) 138/88 (02/08 1027) SpO2:  [88 %-99 %] 96 % (02/08 1027) Last BM Date: 06/08/16  Intake/Output from previous day: 02/07 0701 - 02/08 0700 In: 2000 [IV Piggyback:2000] Out: 800 [Urine:800] Intake/Output this shift: No intake/output data recorded.  General appearance: alert and cooperative GI: normal findings: soft, tender in RUQ  Lab Results:  Results for orders placed or performed during the hospital encounter of 06/08/16 (from the past 24 hour(s))  Lipase, blood     Status: None   Collection Time: 06/08/16 11:15 AM  Result Value Ref Range   Lipase 26 11 - 51 U/L  Comprehensive metabolic panel     Status: Abnormal   Collection Time: 06/08/16 11:15 AM  Result Value Ref Range   Sodium 137 135 - 145 mmol/L   Potassium 4.1 3.5 - 5.1 mmol/L   Chloride 101 101 - 111 mmol/L   CO2 27 22 - 32 mmol/L   Glucose, Bld 295 (H) 65 - 99 mg/dL   BUN 9 6 - 20 mg/dL   Creatinine, Ser 0.95 0.61 - 1.24 mg/dL   Calcium 9.3 8.9 - 10.3 mg/dL   Total Protein 7.1 6.5 - 8.1 g/dL   Albumin 3.3 (L) 3.5 - 5.0 g/dL   AST 22 15 - 41 U/L   ALT 16 (L) 17 - 63 U/L   Alkaline Phosphatase 72 38 - 126 U/L   Total Bilirubin 0.7 0.3 - 1.2 mg/dL   GFR calc non Af Amer >60 >60 mL/min   GFR calc Af Amer >60 >60 mL/min   Anion gap 9 5 - 15  CBC     Status: Abnormal   Collection Time: 06/08/16 11:15 AM  Result Value Ref Range   WBC 11.7 (H) 4.0 - 10.5 K/uL   RBC 4.80 4.22 - 5.81 MIL/uL   Hemoglobin 13.5 13.0 - 17.0 g/dL   HCT 39.1 39.0 - 52.0 %   MCV 81.5 78.0 - 100.0 fL   MCH 28.1 26.0 - 34.0 pg   MCHC 34.5 30.0 - 36.0 g/dL   RDW 12.9 11.5 - 15.5 %   Platelets 199 150 - 400 K/uL  Urinalysis, Routine w reflex  microscopic     Status: Abnormal   Collection Time: 06/08/16  1:48 PM  Result Value Ref Range   Color, Urine YELLOW YELLOW   APPearance HAZY (A) CLEAR   Specific Gravity, Urine 1.021 1.005 - 1.030   pH 5.0 5.0 - 8.0   Glucose, UA >=500 (A) NEGATIVE mg/dL   Hgb urine dipstick NEGATIVE NEGATIVE   Bilirubin Urine NEGATIVE NEGATIVE   Ketones, ur NEGATIVE NEGATIVE mg/dL   Protein, ur 100 (A) NEGATIVE mg/dL   Nitrite NEGATIVE NEGATIVE   Leukocytes, UA NEGATIVE NEGATIVE   RBC / HPF 0-5 0 - 5 RBC/hpf   WBC, UA 0-5 0 - 5 WBC/hpf   Bacteria, UA NONE SEEN NONE SEEN   Squamous Epithelial / LPF NONE SEEN NONE SEEN   Mucous PRESENT   I-stat troponin, ED     Status: None   Collection Time: 06/08/16  2:10 PM  Result Value Ref Range   Troponin i, poc 0.00 0.00 - 0.08 ng/mL   Comment  3          POC occult blood, ED     Status: None   Collection Time: 06/08/16  3:20 PM  Result Value Ref Range   Fecal Occult Bld NEGATIVE NEGATIVE  Comprehensive metabolic panel     Status: Abnormal   Collection Time: 06/08/16  7:21 PM  Result Value Ref Range   Sodium 135 135 - 145 mmol/L   Potassium 3.6 3.5 - 5.1 mmol/L   Chloride 100 (L) 101 - 111 mmol/L   CO2 26 22 - 32 mmol/L   Glucose, Bld 223 (H) 65 - 99 mg/dL   BUN 7 6 - 20 mg/dL   Creatinine, Ser 0.80 0.61 - 1.24 mg/dL   Calcium 8.7 (L) 8.9 - 10.3 mg/dL   Total Protein 7.2 6.5 - 8.1 g/dL   Albumin 3.4 (L) 3.5 - 5.0 g/dL   AST 18 15 - 41 U/L   ALT 15 (L) 17 - 63 U/L   Alkaline Phosphatase 72 38 - 126 U/L   Total Bilirubin 0.6 0.3 - 1.2 mg/dL   GFR calc non Af Amer >60 >60 mL/min   GFR calc Af Amer >60 >60 mL/min   Anion gap 9 5 - 15  Troponin I     Status: None   Collection Time: 06/08/16  7:21 PM  Result Value Ref Range   Troponin I <0.03 <0.03 ng/mL  Glucose, capillary     Status: Abnormal   Collection Time: 06/08/16 10:56 PM  Result Value Ref Range   Glucose-Capillary 243 (H) 65 - 99 mg/dL  Surgical pcr screen     Status: Abnormal    Collection Time: 06/08/16 10:58 PM  Result Value Ref Range   MRSA, PCR NEGATIVE NEGATIVE   Staphylococcus aureus POSITIVE (A) NEGATIVE  Troponin I     Status: None   Collection Time: 06/08/16 11:53 PM  Result Value Ref Range   Troponin I <0.03 <0.03 ng/mL  CBC     Status: None   Collection Time: 06/09/16  5:42 AM  Result Value Ref Range   WBC 10.1 4.0 - 10.5 K/uL   RBC 4.76 4.22 - 5.81 MIL/uL   Hemoglobin 13.0 13.0 - 17.0 g/dL   HCT 39.3 39.0 - 52.0 %   MCV 82.6 78.0 - 100.0 fL   MCH 27.3 26.0 - 34.0 pg   MCHC 33.1 30.0 - 36.0 g/dL   RDW 13.0 11.5 - 15.5 %   Platelets 175 150 - 400 K/uL  Lipase, blood     Status: None   Collection Time: 06/09/16  5:42 AM  Result Value Ref Range   Lipase 26 11 - 51 U/L  Troponin I     Status: None   Collection Time: 06/09/16  5:42 AM  Result Value Ref Range   Troponin I <0.03 <0.03 ng/mL  Lipid panel     Status: Abnormal   Collection Time: 06/09/16  5:42 AM  Result Value Ref Range   Cholesterol 81 0 - 200 mg/dL   Triglycerides 240 (H) <150 mg/dL   HDL 18 (L) >40 mg/dL   Total CHOL/HDL Ratio 4.5 RATIO   VLDL 48 (H) 0 - 40 mg/dL   LDL Cholesterol 15 0 - 99 mg/dL  Basic metabolic panel     Status: Abnormal   Collection Time: 06/09/16  5:42 AM  Result Value Ref Range   Sodium 138 135 - 145 mmol/L   Potassium 4.1 3.5 - 5.1 mmol/L   Chloride 102 101 -  111 mmol/L   CO2 27 22 - 32 mmol/L   Glucose, Bld 239 (H) 65 - 99 mg/dL   BUN 8 6 - 20 mg/dL   Creatinine, Ser 0.86 0.61 - 1.24 mg/dL   Calcium 8.9 8.9 - 10.3 mg/dL   GFR calc non Af Amer >60 >60 mL/min   GFR calc Af Amer >60 >60 mL/min   Anion gap 9 5 - 15  Glucose, capillary     Status: Abnormal   Collection Time: 06/09/16  6:16 AM  Result Value Ref Range   Glucose-Capillary 227 (H) 65 - 99 mg/dL     Studies/Results Radiology     MEDS, Scheduled . amLODipine  10 mg Oral QAC breakfast  . aspirin  325 mg Oral Daily  . enoxaparin (LOVENOX) injection  40 mg Subcutaneous Q24H  .  insulin aspart  0-9 Units Subcutaneous TID WC  . lisinopril  10 mg Oral Daily  . rosuvastatin  20 mg Oral q1800  . sodium chloride flush  3 mL Intravenous Q12H     Assessment: RUQ pain   Plan: Will await results of HIDA.  If he needs surgery, will need medical clearance for his chest pain first, although it seems more related to abdominal pathology to me.    LOS: 0 days    Rosario Adie, MD [facility], [place] [phone]   06/09/2016 10:39 AM

## 2016-06-09 NOTE — Progress Notes (Signed)
HIDA:  1. Normal nuclear medicine hepatobiliary scan. 2. Normal gallbladder ejection fraction of 80%. Some pain was noted with the administration of Ensure.  He still complains of pain RLQ.  Cardiology consult is pending.  Troponin's are negative x 4. Triglycerides 240.  I will put him back on clears, Dr. Brantley Stage will see and discuss possible cholecystectomy later today.

## 2016-06-09 NOTE — Progress Notes (Signed)
Offered pt pain medication, pt decline at this time, he c/o minimal pain and will wait at a later time. Will continue to monitor,   Ave Filter, RN

## 2016-06-10 ENCOUNTER — Encounter (HOSPITAL_COMMUNITY)
Admission: EM | Disposition: A | Discharge status: Home / Self Care | Payer: Self-pay | Source: Home / Self Care | Attending: Family Medicine

## 2016-06-10 ENCOUNTER — Inpatient Hospital Stay (HOSPITAL_COMMUNITY): Payer: Medicaid Other | Admitting: Anesthesiology

## 2016-06-10 ENCOUNTER — Inpatient Hospital Stay (HOSPITAL_COMMUNITY): Payer: Medicaid Other

## 2016-06-10 ENCOUNTER — Encounter (HOSPITAL_COMMUNITY): Payer: Self-pay | Admitting: Certified Registered Nurse Anesthetist

## 2016-06-10 DIAGNOSIS — R1011 Right upper quadrant pain: Secondary | ICD-10-CM

## 2016-06-10 DIAGNOSIS — R52 Pain, unspecified: Secondary | ICD-10-CM

## 2016-06-10 HISTORY — PX: CHOLECYSTECTOMY: SHX55

## 2016-06-10 LAB — CBC
HEMATOCRIT: 39.2 % (ref 39.0–52.0)
Hemoglobin: 13.3 g/dL (ref 13.0–17.0)
MCH: 28 pg (ref 26.0–34.0)
MCHC: 33.9 g/dL (ref 30.0–36.0)
MCV: 82.5 fL (ref 78.0–100.0)
PLATELETS: 162 10*3/uL (ref 150–400)
RBC: 4.75 MIL/uL (ref 4.22–5.81)
RDW: 13.3 % (ref 11.5–15.5)
WBC: 11.6 10*3/uL — AB (ref 4.0–10.5)

## 2016-06-10 LAB — GLUCOSE, CAPILLARY
GLUCOSE-CAPILLARY: 175 mg/dL — AB (ref 65–99)
GLUCOSE-CAPILLARY: 202 mg/dL — AB (ref 65–99)
GLUCOSE-CAPILLARY: 226 mg/dL — AB (ref 65–99)
Glucose-Capillary: 173 mg/dL — ABNORMAL HIGH (ref 65–99)
Glucose-Capillary: 178 mg/dL — ABNORMAL HIGH (ref 65–99)

## 2016-06-10 LAB — BASIC METABOLIC PANEL
ANION GAP: 10 (ref 5–15)
BUN: 9 mg/dL (ref 6–20)
CALCIUM: 9.2 mg/dL (ref 8.9–10.3)
CO2: 27 mmol/L (ref 22–32)
Chloride: 102 mmol/L (ref 101–111)
Creatinine, Ser: 0.89 mg/dL (ref 0.61–1.24)
GLUCOSE: 184 mg/dL — AB (ref 65–99)
POTASSIUM: 3.9 mmol/L (ref 3.5–5.1)
Sodium: 139 mmol/L (ref 135–145)

## 2016-06-10 LAB — ECHOCARDIOGRAM COMPLETE

## 2016-06-10 SURGERY — LAPAROSCOPIC CHOLECYSTECTOMY WITH INTRAOPERATIVE CHOLANGIOGRAM
Anesthesia: General | Site: Abdomen

## 2016-06-10 MED ORDER — SUGAMMADEX SODIUM 200 MG/2ML IV SOLN
INTRAVENOUS | Status: DC | PRN
  Administered 2016-06-10: 300 mg via INTRAVENOUS

## 2016-06-10 MED ORDER — LACTATED RINGERS IV SOLN
INTRAVENOUS | Status: DC | PRN
  Administered 2016-06-10 (×2): via INTRAVENOUS

## 2016-06-10 MED ORDER — ONDANSETRON HCL 4 MG/2ML IJ SOLN
INTRAMUSCULAR | Status: AC
  Filled 2016-06-10: qty 2

## 2016-06-10 MED ORDER — SUGAMMADEX SODIUM 500 MG/5ML IV SOLN
INTRAVENOUS | Status: AC
  Filled 2016-06-10: qty 5

## 2016-06-10 MED ORDER — MIDAZOLAM HCL 2 MG/2ML IJ SOLN
INTRAMUSCULAR | Status: AC
Start: 2016-06-10 — End: 2016-06-10
  Filled 2016-06-10: qty 2

## 2016-06-10 MED ORDER — MUPIROCIN 2 % EX OINT
TOPICAL_OINTMENT | Freq: Two times a day (BID) | CUTANEOUS | Status: DC
Start: 2016-06-10 — End: 2016-06-11
  Administered 2016-06-10: 21:00:00 via NASAL
  Filled 2016-06-10: qty 22

## 2016-06-10 MED ORDER — IOPAMIDOL (ISOVUE-300) INJECTION 61%
INTRAVENOUS | Status: AC
  Filled 2016-06-10: qty 50

## 2016-06-10 MED ORDER — EPHEDRINE SULFATE 50 MG/ML IJ SOLN
INTRAMUSCULAR | Status: DC | PRN
  Administered 2016-06-10: 5 mg via INTRAVENOUS
  Administered 2016-06-10: 10 mg via INTRAVENOUS

## 2016-06-10 MED ORDER — PHENYLEPHRINE 40 MCG/ML (10ML) SYRINGE FOR IV PUSH (FOR BLOOD PRESSURE SUPPORT)
PREFILLED_SYRINGE | INTRAVENOUS | Status: AC
  Filled 2016-06-10: qty 10

## 2016-06-10 MED ORDER — BUPIVACAINE HCL (PF) 0.25 % IJ SOLN
INTRAMUSCULAR | Status: AC
  Filled 2016-06-10: qty 30

## 2016-06-10 MED ORDER — ROCURONIUM BROMIDE 10 MG/ML (PF) SYRINGE
PREFILLED_SYRINGE | INTRAVENOUS | Status: DC | PRN
  Administered 2016-06-10: 50 mg via INTRAVENOUS

## 2016-06-10 MED ORDER — PROPOFOL 10 MG/ML IV BOLUS
INTRAVENOUS | Status: AC
  Filled 2016-06-10: qty 40

## 2016-06-10 MED ORDER — PERFLUTREN LIPID MICROSPHERE
1.0000 mL | INTRAVENOUS | Status: DC | PRN
  Administered 2016-06-10: 1 mL via INTRAVENOUS
  Administered 2016-06-10: 2 mL via INTRAVENOUS
  Filled 2016-06-10: qty 10

## 2016-06-10 MED ORDER — CEFAZOLIN SODIUM 1 G IJ SOLR
INTRAMUSCULAR | Status: DC | PRN
  Administered 2016-06-10: 2 g via INTRAMUSCULAR

## 2016-06-10 MED ORDER — SODIUM CHLORIDE 0.9 % IR SOLN
Status: DC | PRN
  Administered 2016-06-10: 2000 mL

## 2016-06-10 MED ORDER — PROPOFOL 10 MG/ML IV BOLUS
INTRAVENOUS | Status: DC | PRN
  Administered 2016-06-10: 200 mg via INTRAVENOUS

## 2016-06-10 MED ORDER — FENTANYL CITRATE (PF) 100 MCG/2ML IJ SOLN
INTRAMUSCULAR | Status: DC | PRN
  Administered 2016-06-10: 50 ug via INTRAVENOUS
  Administered 2016-06-10: 100 ug via INTRAVENOUS

## 2016-06-10 MED ORDER — PROMETHAZINE HCL 25 MG/ML IJ SOLN
INTRAMUSCULAR | Status: AC
  Filled 2016-06-10: qty 1

## 2016-06-10 MED ORDER — BUPIVACAINE-EPINEPHRINE 0.25% -1:200000 IJ SOLN
INTRAMUSCULAR | Status: DC | PRN
  Administered 2016-06-10: 3 mL

## 2016-06-10 MED ORDER — LIDOCAINE 2% (20 MG/ML) 5 ML SYRINGE
INTRAMUSCULAR | Status: DC | PRN
  Administered 2016-06-10: 100 mg via INTRAVENOUS

## 2016-06-10 MED ORDER — FENTANYL CITRATE (PF) 100 MCG/2ML IJ SOLN
INTRAMUSCULAR | Status: AC
  Filled 2016-06-10: qty 4

## 2016-06-10 MED ORDER — PROMETHAZINE HCL 25 MG/ML IJ SOLN
6.2500 mg | INTRAMUSCULAR | Status: DC | PRN
  Administered 2016-06-10: 6.25 mg via INTRAVENOUS

## 2016-06-10 MED ORDER — ONDANSETRON HCL 4 MG/2ML IJ SOLN
INTRAMUSCULAR | Status: DC | PRN
  Administered 2016-06-10: 4 mg via INTRAVENOUS

## 2016-06-10 MED ORDER — LACTATED RINGERS IV SOLN
Freq: Once | INTRAVENOUS | Status: AC
  Administered 2016-06-10: 13:00:00 via INTRAVENOUS

## 2016-06-10 MED ORDER — MIDAZOLAM HCL 2 MG/2ML IJ SOLN
INTRAMUSCULAR | Status: AC
  Filled 2016-06-10: qty 2

## 2016-06-10 MED ORDER — OXYCODONE-ACETAMINOPHEN 5-325 MG PO TABS
1.0000 | ORAL_TABLET | ORAL | Status: DC | PRN
  Administered 2016-06-10 – 2016-06-11 (×4): 1 via ORAL
  Filled 2016-06-10 (×4): qty 1

## 2016-06-10 MED ORDER — MIDAZOLAM HCL 5 MG/5ML IJ SOLN
INTRAMUSCULAR | Status: DC | PRN
  Administered 2016-06-10: 2 mg via INTRAVENOUS

## 2016-06-10 MED ORDER — METOPROLOL TARTRATE 12.5 MG HALF TABLET
12.5000 mg | ORAL_TABLET | Freq: Two times a day (BID) | ORAL | Status: DC
  Administered 2016-06-10 – 2016-06-11 (×3): 12.5 mg via ORAL
  Filled 2016-06-10 (×3): qty 1

## 2016-06-10 SURGICAL SUPPLY — 45 items
ADH SKN CLS APL DERMABOND .7 (GAUZE/BANDAGES/DRESSINGS) ×1
APPLIER CLIP 5 13 M/L LIGAMAX5 (MISCELLANEOUS) ×3
APPLIER CLIP ROT 10 11.4 M/L (STAPLE) ×3
APR CLP MED LRG 11.4X10 (STAPLE) ×1
APR CLP MED LRG 5 ANG JAW (MISCELLANEOUS) ×1
BAG SPEC RTRVL LRG 6X4 10 (ENDOMECHANICALS) ×1
BLADE SURG ROTATE 9660 (MISCELLANEOUS) IMPLANT
CANISTER SUCTION 2500CC (MISCELLANEOUS) ×3 IMPLANT
CHLORAPREP W/TINT 26ML (MISCELLANEOUS) ×3 IMPLANT
CLIP APPLIE 5 13 M/L LIGAMAX5 (MISCELLANEOUS) IMPLANT
CLIP APPLIE ROT 10 11.4 M/L (STAPLE) ×1 IMPLANT
COVER MAYO STAND STRL (DRAPES) ×3 IMPLANT
COVER SURGICAL LIGHT HANDLE (MISCELLANEOUS) ×3 IMPLANT
DECANTER SPIKE VIAL GLASS SM (MISCELLANEOUS) ×4 IMPLANT
DERMABOND ADVANCED (GAUZE/BANDAGES/DRESSINGS) ×2
DERMABOND ADVANCED .7 DNX12 (GAUZE/BANDAGES/DRESSINGS) ×1 IMPLANT
DRAPE C-ARM 42X72 X-RAY (DRAPES) ×3 IMPLANT
DRAPE WARM FLUID 44X44 (DRAPE) ×3 IMPLANT
ELECT REM PT RETURN 9FT ADLT (ELECTROSURGICAL) ×3
ELECTRODE REM PT RTRN 9FT ADLT (ELECTROSURGICAL) ×1 IMPLANT
GLOVE BIO SURGEON STRL SZ8 (GLOVE) ×3 IMPLANT
GLOVE BIOGEL PI IND STRL 8 (GLOVE) ×1 IMPLANT
GLOVE BIOGEL PI INDICATOR 8 (GLOVE) ×2
GOWN STRL REUS W/ TWL LRG LVL3 (GOWN DISPOSABLE) ×2 IMPLANT
GOWN STRL REUS W/ TWL XL LVL3 (GOWN DISPOSABLE) ×1 IMPLANT
GOWN STRL REUS W/TWL LRG LVL3 (GOWN DISPOSABLE) ×6
GOWN STRL REUS W/TWL XL LVL3 (GOWN DISPOSABLE) ×3
KIT BASIN OR (CUSTOM PROCEDURE TRAY) ×3 IMPLANT
KIT ROOM TURNOVER OR (KITS) ×3 IMPLANT
NS IRRIG 1000ML POUR BTL (IV SOLUTION) ×3 IMPLANT
PAD ARMBOARD 7.5X6 YLW CONV (MISCELLANEOUS) ×5 IMPLANT
POUCH SPECIMEN RETRIEVAL 10MM (ENDOMECHANICALS) ×3 IMPLANT
SCISSORS LAP 5X35 DISP (ENDOMECHANICALS) ×3 IMPLANT
SET CHOLANGIOGRAPH 5 50 .035 (SET/KITS/TRAYS/PACK) ×3 IMPLANT
SET IRRIG TUBING LAPAROSCOPIC (IRRIGATION / IRRIGATOR) ×3 IMPLANT
SLEEVE ENDOPATH XCEL 5M (ENDOMECHANICALS) ×5 IMPLANT
SPECIMEN JAR SMALL (MISCELLANEOUS) ×3 IMPLANT
SUT MNCRL AB 4-0 PS2 18 (SUTURE) ×3 IMPLANT
TOWEL OR 17X24 6PK STRL BLUE (TOWEL DISPOSABLE) ×3 IMPLANT
TOWEL OR 17X26 10 PK STRL BLUE (TOWEL DISPOSABLE) ×1 IMPLANT
TRAY LAPAROSCOPIC MC (CUSTOM PROCEDURE TRAY) ×3 IMPLANT
TROCAR XCEL BLUNT TIP 100MML (ENDOMECHANICALS) ×3 IMPLANT
TROCAR XCEL NON-BLD 11X100MML (ENDOMECHANICALS) ×3 IMPLANT
TROCAR XCEL NON-BLD 5MMX100MML (ENDOMECHANICALS) ×3 IMPLANT
TUBING INSUFFLATION (TUBING) ×3 IMPLANT

## 2016-06-10 NOTE — Progress Notes (Signed)
Pt states he tried to wear CPAP while he was at [facility] and was unable to tolerate it. Pt refuses CPAP for tonight but encouraged to call RT if he changes his mind.

## 2016-06-10 NOTE — Progress Notes (Signed)
Progress Note  Patient Name: Jacob [name] Date of Encounter: 06/10/2016  Primary Cardiologist: Dr Claiborne Billings (new)  Subjective   No chest, no complaints of palpitations  Inpatient Medications    Scheduled Meds: . amLODipine  10 mg Oral QAC breakfast  . aspirin EC  81 mg Oral Daily  . enoxaparin (LOVENOX) injection  40 mg Subcutaneous Q24H  . insulin aspart  0-9 Units Subcutaneous TID WC  . lisinopril  10 mg Oral Daily  . mupirocin ointment   Nasal BID  . rosuvastatin  20 mg Oral q1800  . sodium chloride flush  3 mL Intravenous Q12H   Continuous Infusions:  PRN Meds: sodium chloride, acetaminophen, morphine injection, nitroGLYCERIN, ondansetron (ZOFRAN) IV, sodium chloride flush   Vital Signs    Vitals:   06/09/16 0545 06/09/16 1027 06/09/16 2211 06/10/16 0700  BP: (!) 144/91 138/88 (!) 155/104 139/88  Pulse: 84 87 86 85  Resp: 18 18 16    Temp: 98.2 F (36.8 C) 98 F (36.7 C) 98.6 F (37 C) 98.7 F (37.1 C)  TempSrc: Oral Oral Oral   SpO2: 93% 96% 98% 97%    Intake/Output Summary (Last 24 hours) at 06/10/16 0955 Last data filed at 06/09/16 1700  Gross per 24 hour  Intake              120 ml  Output                0 ml  Net              120 ml   There were no vitals filed for this visit.  Telemetry    NSR with a run of PSVT last PM - Personally Reviewed  ECG    NSR - Personally Reviewed  Physical Exam   GEN: No acute distress.   Neck: No JVD Cardiac: RRR, no murmurs, rubs, or gallops.  Respiratory: Clear to auscultation bilaterally. MS: No edema; No deformity. Neuro:  Nonfocal  Psych: Normal affect   Labs    Chemistry Recent Labs Lab 06/08/16 1115 06/08/16 1921 06/09/16 0542  NA 137 135 138  K 4.1 3.6 4.1  CL 101 100* 102  CO2 27 26 27   GLUCOSE 295* 223* 239*  BUN 9 7 8   CREATININE 0.95 0.80 0.86  CALCIUM 9.3 8.7* 8.9  PROT 7.1 7.2  --   ALBUMIN 3.3* 3.4*  --   AST 22 18  --   ALT 16* 15*  --   ALKPHOS 72 72  --   BILITOT 0.7  0.6  --   GFRNONAA >60 >60 >60  GFRAA >60 >60 >60  ANIONGAP 9 9 9      Hematology Recent Labs Lab 06/08/16 1115 06/09/16 0542 06/10/16 0706  WBC 11.7* 10.1 11.6*  RBC 4.80 4.76 4.75  HGB 13.5 13.0 13.3  HCT 39.1 39.3 39.2  MCV 81.5 82.6 82.5  MCH 28.1 27.3 28.0  MCHC 34.5 33.1 33.9  RDW 12.9 13.0 13.3  PLT 199 175 162    Cardiac Enzymes Recent Labs Lab 06/08/16 1921 06/08/16 2353 06/09/16 0542  TROPONINI <0.03 <0.03 <0.03    Recent Labs Lab 06/08/16 1410  TROPIPOC 0.00     BNPNo results for input(s): BNP, PROBNP in the last 168 hours.   DDimer No results for input(s): DDIMER in the last 168 hours.   Radiology    Nm Hepato W/eject Fract  Result Date: 06/09/2016 CLINICAL DATA:  Right upper quadrant pain, nausea, known gallstones EXAM:  NUCLEAR MEDICINE HEPATOBILIARY IMAGING WITH GALLBLADDER EF TECHNIQUE: Sequential images of the abdomen were obtained out to 60 minutes following intravenous administration of radiopharmaceutical. After oral ingestion of Ensure, gallbladder ejection fraction was determined. At 60 min, normal ejection fraction is greater than 33%. RADIOPHARMACEUTICALS:  5.2 mCi Tc-99m  Choletec IV COMPARISON:  Ultrasound the abdomen of 06/08/2016 FINDINGS: Prompt uptake and biliary excretion of activity by the liver is seen. Gallbladder activity is visualized, consistent with patency of cystic duct. Biliary activity passes into small bowel, consistent with patent common bile duct. Calculated gallbladder ejection fraction is 80%. (Normal gallbladder ejection fraction with Ensure is greater than 33%.) IMPRESSION: 1. Normal nuclear medicine hepatobiliary scan. 2. Normal gallbladder ejection fraction of 80%. Some pain was noted with the administration of Ensure. Electronically Signed   By: Ivar Drape M.D.   On: 06/09/2016 10:34   Dg Abdomen Acute W/chest  Result Date: 06/08/2016 CLINICAL DATA:  Right-sided abdominal pain radiating to the back over the last 5 days.  EXAM: DG ABDOMEN ACUTE W/ 1V CHEST COMPARISON:  Ultrasound same day.  Chest radiography 08/25/2011. FINDINGS: Chest shows normal heart size. Mild tortuosity of the aorta. The right lung is clear. Chronic elevation of the left hemidiaphragm with mild chronic scarring at the left base. Two view abdominal exam shows a moderate amount of fecal matter but no evidence of ileus, obstruction or free air. Multiple gallstones noted in the right upper quadrant. Ordinary lower lumbar degenerative changes. IMPRESSION: Multiple small gallstones. Gas pattern unremarkable, with a moderate amount of fecal matter but no sign of ileus or obstruction. Chronic elevation of the left hemidiaphragm with left base scarring. Electronically Signed   By: Nelson Chimes M.D.   On: 06/08/2016 13:32   US Abdomen Limited Ruq  Result Date: 06/08/2016 CLINICAL DATA:  Severe right upper quadrant pain since last night. EXAM: US ABDOMEN LIMITED - RIGHT UPPER QUADRANT COMPARISON:  None. FINDINGS: Gallbladder: Multiple shadowing gallstones measuring up to 1.2 cm in size. No gallbladder wall thickening or pericholecystic fluid. Negative sonographic Murphy sign per the sonographer. Common bile duct: Diameter: 5.5 mm Liver: Diffusely increased echogenicity suggestive of steatosis with 4 cm mildly ill-defined region of decreased echogenicity adjacent to the gallbladder fossa most compatible with fatty sparing. IMPRESSION: 1. Cholelithiasis without evidence of acute cholecystitis. 2. Hepatic steatosis. Electronically Signed   By: Logan Bores M.D.   On: 06/08/2016 13:24    Cardiac Studies   Echo is pending  Patient Profile     61 y.o. male with past medical history significant for Type 2 DM, HTN, HLD and obesity who presented to the [facility] ED for evaluation of LUQ pain and also had chest pain. He has been diagnosed with cholecystitis and need cholecystectomy. Surgery is on hold till his echo is reviewed.   Assessment & Plan    Atypical Chest  pain -Echo has been ordered. Will check for wall motion and LV function -Patient is moderate risk due to poorly controlled diabetes, fair controlled HTN, and atypical chest pain with chronic DOE for this low risk endoscopic surgery.   HLD -Lipid panel 06/09/2016: LDL 15, triglycerides 240 (trig were 559 in 2016) -No longer taking crestor 20 mg  Diabetes Type II -Pt admits to constant dietary indiscretions -Hgb A1c 8.5 on 03/11/2016, was 11.6 in 04/2015 -Manage by IM  Hypertension -Continue home meds: lisinopril 10 mg, amlodipine 10 mg -Fair control  OSA -prescribed CPAP but has never wanted to use. Discussed benefits of treatment with  pt.  PSVT Noted on telemetry after admission. Add low dose beta blocker.  Plan: Add lopressor. Awaiting echo.  Angelena Form, PA-C  06/10/2016, 9:55 AM    Patient seen and examined. Agree with assessment and plan. Echo I currently being done. Preliminary view by me shows nl LV fxn with grade I diastolic dysfunction, LVH probably contributed by HTN. No significant valvular abnormalities.  [place] for surgery. Discussed need for weight loss, improved diabetic management and need for f/u eval of OSA.    Troy Sine, MD, [facility] 06/10/2016 10:15 AM

## 2016-06-10 NOTE — Progress Notes (Signed)
FPTS Interim Progress Note  S: Patient seen and examined at bedside this evening. Patient is status post echocardiogram and lap cholecystectomy today. Still feels drowsy after having surgery. States his pain is now controlled, and his abdomen only hurts when he coughs.  O: BP 138/89 (BP Location: Right Arm)   Pulse 85   Temp 98 F (36.7 C) (Oral)   Resp 16   SpO2 92%   Gen: rests comfortably in bed CARD: RRR, no m/r/g PULM: CTA bil, no W/R/R ABD: soft, appropriately tender, non-distended, laparoscopy scars  EXT: moves 4 extremities equally, no LE edema  A/P: Recovering from surgery well. - Continue plan per daily progress notes  Everrett Coombe, MD 06/10/2016, 9:54 PM PGY-1, [place] Medicine Service pager [phone]

## 2016-06-10 NOTE — H&P (View-Only) (Signed)
HIDA:  1. Normal nuclear medicine hepatobiliary scan. 2. Normal gallbladder ejection fraction of 80%. Some pain was noted with the administration of Ensure.  He still complains of pain RLQ.  Cardiology consult is pending.  Troponin's are negative x 4. Triglycerides 240.  I will put him back on clears, Dr. Brantley Stage will see and discuss possible cholecystectomy later today.

## 2016-06-10 NOTE — Progress Notes (Signed)
**  Social Note**  Attempted to see patient this am.  He was not in room.  Will attempt to revisit this afternoon.  Ashly M. Lajuana Ripple, DO PGY-3, [facility] Family Medicine Residency

## 2016-06-10 NOTE — Progress Notes (Signed)
  Echocardiogram 2D Echocardiogram with Definity has been performed.  Jacob [name] 06/10/2016, 10:46 AM

## 2016-06-10 NOTE — Discharge Instructions (Signed)
Laparoscopic Cholecystectomy, Care After °This sheet gives you information about how to care for yourself after your procedure. Your doctor may also give you more specific instructions. If you have problems or questions, contact your doctor. °Follow these instructions at home: °Care for cuts from surgery (incisions) ° °· Follow instructions from your doctor about how to take care of your cuts from surgery. Make sure you: °? Wash your hands with soap and water before you change your bandage (dressing). If you cannot use soap and water, use hand sanitizer. °? Change your bandage as told by your doctor. °? Leave stitches (sutures), skin glue, or skin tape (adhesive) strips in place. They may need to stay in place for 2 weeks or longer. If tape strips get loose and curl up, you may trim the loose edges. Do not remove tape strips completely unless your doctor says it is okay. °· Do not take baths, swim, or use a hot tub until your doctor says it is okay. Ask your doctor if you can take showers. You may only be allowed to take sponge baths for bathing. °· Check your surgical cut area every day for signs of infection. Check for: °? More redness, swelling, or pain. °? More fluid or blood. °? Warmth. °? Pus or a bad smell. °Activity °· Do not drive or use heavy machinery while taking prescription pain medicine. °· Do not lift anything that is heavier than 10 lb (4.5 kg) until your doctor says it is okay. °· Do not play contact sports until your doctor says it is okay. °· Do not drive for 24 hours if you were given a medicine to help you relax (sedative). °· Rest as needed. Do not return to work or school until your doctor says it is okay. °General instructions °· Take over-the-counter and prescription medicines only as told by your doctor. °· To prevent or treat constipation while you are taking prescription pain medicine, your doctor may recommend that you: °? Drink enough fluid to keep your pee (urine) clear or pale  yellow. °? Take over-the-counter or prescription medicines. °? Eat foods that are high in fiber, such as fresh fruits and vegetables, whole grains, and beans. °? Limit foods that are high in fat and processed sugars, such as fried and sweet foods. °Contact a doctor if: °· You develop a rash. °· You have more redness, swelling, or pain around your surgical cuts. °· You have more fluid or blood coming from your surgical cuts. °· Your surgical cuts feel warm to the touch. °· You have pus or a bad smell coming from your surgical cuts. °· You have a fever. °· One or more of your surgical cuts breaks open. °Get help right away if: °· You have trouble breathing. °· You have chest pain. °· You have pain that is getting worse in your shoulders. °· You faint or feel dizzy when you stand. °· You have very bad pain in your belly (abdomen). °· You are sick to your stomach (nauseous) for more than one day. °· You have throwing up (vomiting) that lasts for more than one day. °· You have leg pain. °This information is not intended to replace advice given to you by your health care provider. Make sure you discuss any questions you have with your health care provider. °Document Released: 01/26/2008 Document Revised: 11/07/2015 Document Reviewed: 10/05/2015 °Elsevier Interactive Patient Education © 2017 [employer] ° °

## 2016-06-10 NOTE — Anesthesia Procedure Notes (Signed)
Procedure Name: Intubation Date/Time: 06/10/2016 3:00 PM Performed by: Rejeana Brock L Pre-anesthesia Checklist: Patient identified, Emergency Drugs available, Suction available and Patient being monitored Patient Re-evaluated:Patient Re-evaluated prior to inductionOxygen Delivery Method: Circle System Utilized Preoxygenation: Pre-oxygenation with 100% oxygen Intubation Type: IV induction Ventilation: Mask ventilation without difficulty Laryngoscope Size: Mac and 4 Grade View: Grade I Tube type: Oral Tube size: 8.0 mm Number of attempts: 1 Airway Equipment and Method: Stylet and Oral airway Placement Confirmation: ETT inserted through vocal cords under direct vision,  positive ETCO2 and breath sounds checked- equal and bilateral Secured at: 22 cm Tube secured with: Tape Dental Injury: Teeth and Oropharynx as per pre-operative assessment

## 2016-06-10 NOTE — Interval H&P Note (Signed)
History and Physical Interval Note:  06/10/2016 2:27 PM  Jacob [name]  has presented today for surgery, with the diagnosis of SYMPTOMATIC CHOLELITHIASIS  The various methods of treatment have been discussed with the patient and family. After consideration of risks, benefits and other options for treatment, the patient has consented to  Procedure(s): LAPAROSCOPIC CHOLECYSTECTOMY WITH INTRAOPERATIVE CHOLANGIOGRAM (N/A) as a surgical intervention .  The patient's history has been reviewed, patient examined, no change in status, stable for surgery.  I have reviewed the patient's chart and labs.  Questions were answered to the patient's satisfaction.   The procedure has been discussed with the patient. Operative and non operative treatments have been discussed. Risks of surgery include bleeding, infection,  Common bile duct injury,  Injury to the stomach,liver, colon,small intestine, abdominal wall,  Diaphragm,  Major blood vessels,  And the need for an open procedure.  Other risks include worsening of medical problems, death,  DVT and pulmonary embolism, and cardiovascular events.   Medical options have also been discussed. The patient has been informed of long term expectations of surgery and non surgical options,  The patient agrees to proceed.    Dink Creps A.

## 2016-06-10 NOTE — Progress Notes (Signed)
Echo completed, cardiology MD made aware.  Awaiting clearance for surgery.

## 2016-06-10 NOTE — Transfer of Care (Signed)
Immediate Anesthesia Transfer of Care Note  Patient: Jacob [name]  Procedure(s) Performed: Procedure(s): LAPAROSCOPIC CHOLECYSTECTOMY WITH INTRAOPERATIVE CHOLANGIOGRAM (N/A)  Patient Location: PACU  Anesthesia Type:General  Level of Consciousness: awake  Airway & Oxygen Therapy: Patient Spontanous Breathing and Patient connected to nasal cannula oxygen  Post-op Assessment: Report given to RN, Post -op Vital signs reviewed and stable and Patient moving all extremities X 4  Post vital signs: Reviewed and stable  Last Vitals:  Vitals:   06/10/16 0700 06/10/16 1618  BP: 139/88 (!) 150/90  Pulse: 85 97  Resp:  (!) 25  Temp: 37.1 C 36.1 C    Last Pain:  Vitals:   06/10/16 1618  TempSrc:   PainSc: Asleep         Complications: No apparent anesthesia complications

## 2016-06-10 NOTE — Progress Notes (Signed)
Family Medicine Teaching Service Daily Progress Note Intern Pager: [phone]  Patient name: Jacob [name] Medical record number: [id number] Date of birth: 02-07-1956 Age: 61 y.o. Gender: male  Primary Care Provider: Ronnie Doss, DO Consultants: General Surgery, Cardiology Code Status: Full  Pt Overview and Major Events to Date:  02/07: Patient admitted for RUQ found to have cholelithiasis  Assessment and Plan: Jacob [name] is a 61 y.o. male presenting with RUQ pain, found to have cholelithiasis, also with chest pain. PMH is significant for T2DM, HTN, HLD  #biliary colic without cholecystitis: Acute, stable. Cholecystitis most concerning though imaging does not support this. Patient has RUQ pain, leukocytosis to 11.7>11.6. Remains afebrile. RUQ U/S showed multiple gallstones, no GB wall thickening or pericholecystic fluid, negative sonographic murphy sign.  Surgery plans for chole possibly today pending cardiac clearance. HIDA was WNL. - following gen surg recs - Cardiac monitoring - Morphine 2 mg q6h PRN - Tylenol PRN - Zofran PRN - NPO for possible cholecystectomy today - Blood Cx NGTD  #Atypical chest tightness  Obstructive sleep apnea: Chronic, stable. Onset 6 months ago. Concerning for cardiac etiology given associated dyspnea on exertion, orthopnea, PND. Troponins negative x4. EKG NSR. HEART score 2. Patient has CPAP at home however not using. Could be related to OSA however heart failure of concern given history. - Echocardiogram pending - Appreciate cards recs: Clear for surgery if no significant abnormalities on echo - rec outpt sleep study  #Hyperlipidemia: Chronic, stable. Lipid profile this admission Chol 81, Triglycerides 240, HDL 18. Pre-hospital home regimen includes ASA 325, Rosuvostatin 20 mg - Rosuvostatin 20 mg QD  - Dec to ASA 81 mg QD this admission  #Non-insulin-dependent diabetes mellitus: Chronic, stable. Last last HbA1c 8.5 in 03/2016. Home  regimen includes metformin 1000 mg BID. Glipizide noted in the computer but patient indicates he is no longer taking this. - CBG's 170s o/n - Holding home metformin - Sensitive sliding scale - A1c pending  #Hypertension: Chronic, stable. Controlled on admission. Home regimen amlodipine 10 mg qd at home, lisinopril 10 mg qd at home. - Amlodipine 10 mg QD - Lisinopril 10 mg QD  #Lower extremity neuropathy: Chronic, stable.  - Hold home meloxicam for now  FEN/GI: Cardiac/Carb modified diet, NPO at midnight incase needs surgery Prophylaxis: Lovenox SQ  Disposition: Pending cholecystectomy and further cardiac workup.  Subjective:  No acute events overnight.   Objective: Temp:  [98 F (36.7 C)-98.7 F (37.1 C)] 98.7 F (37.1 C) (02/09 0700) Pulse Rate:  [85-87] 85 (02/09 0700) Resp:  [16-18] 16 (02/08 2211) BP: (138-155)/(88-104) 139/88 (02/09 0700) SpO2:  [96 %-98 %] 97 % (02/09 0700)   Laboratory:  Recent Labs Lab 06/08/16 1115 06/09/16 0542 06/10/16 0706  WBC 11.7* 10.1 11.6*  HGB 13.5 13.0 13.3  HCT 39.1 39.3 39.2  PLT 199 175 162    Recent Labs Lab 06/08/16 1115 06/08/16 1921 06/09/16 0542  NA 137 135 138  K 4.1 3.6 4.1  CL 101 100* 102  CO2 27 26 27   BUN 9 7 8   CREATININE 0.95 0.80 0.86  CALCIUM 9.3 8.7* 8.9  PROT 7.1 7.2  --   BILITOT 0.7 0.6  --   ALKPHOS 72 72  --   ALT 16* 15*  --   AST 22 18  --   GLUCOSE 295* 223* 239*   Urinalysis    Component Value Date/Time   COLORURINE YELLOW 06/08/2016 1348   APPEARANCEUR HAZY (A) 06/08/2016 1348   LABSPEC  1.021 06/08/2016 1348   PHURINE 5.0 06/08/2016 1348   GLUCOSEU >=500 (A) 06/08/2016 1348   HGBUR NEGATIVE 06/08/2016 1348   BILIRUBINUR NEGATIVE 06/08/2016 1348   BILIRUBINUR NEG 04/08/2015 1021   KETONESUR NEGATIVE 06/08/2016 1348   PROTEINUR 100 (A) 06/08/2016 1348   UROBILINOGEN 0.2 04/08/2015 1021   UROBILINOGEN 0.2 08/10/2011 1534   NITRITE NEGATIVE 06/08/2016 1348   LEUKOCYTESUR  NEGATIVE 06/08/2016 1348   Lipid Panel     Component Value Date/Time   CHOL 81 06/09/2016 0542   TRIG 240 (H) 06/09/2016 0542   HDL 18 (L) 06/09/2016 0542   CHOLHDL 4.5 06/09/2016 0542   VLDL 48 (H) 06/09/2016 0542   LDLCALC 15 06/09/2016 0542   LDLDIRECT 57 07/15/2010 2059   --Lipase: 26 (L) --Blood culture: Pending 2 --I-STAT troponin: Negative --Troponin-I: Negative 2 --FOBT: Negative --A1c: Pending  Imaging/Diagnostic Tests: NM Hepato W/EjeCT Fract (06/09/2016) Pending  DG Abdomen Acute W/Chest (06/08/2016) FINDINGS: Chest shows normal heart size. Mild tortuosity of the aorta. The right lung is clear. Chronic elevation of the left hemidiaphragm with mild chronic scarring at the left base. Two view abdominal exam shows a moderate amount of fecal matter but no evidence of ileus, obstruction or free air. Multiple gallstones noted in the right upper quadrant. Ordinary lower lumbar degenerative changes.  IMPRESSION: Multiple small gallstones. Gas pattern unremarkable, with a moderate amount of fecal matter but no sign of ileus or obstruction. Chronic elevation of the left hemidiaphragm with left base scarring.  US Abdomen Limited RUQ (06/08/2016) FINDINGS: Gallbladder: Multiple shadowing gallstones measuring up to 1.2 cm in size. No gallbladder wall thickening or pericholecystic fluid. Negative sonographic Murphy sign per the sonographer.  Common bile duct: Diameter: 5.5 mm  Liver: Diffusely increased echogenicity suggestive of steatosis with 4 cm mildly ill-defined region of decreased echogenicity adjacent to the gallbladder fossa most compatible with fatty sparing.  IMPRESSION: 1. Cholelithiasis without evidence of acute cholecystitis. 2. Hepatic steatosis.    Jacob Coombe, MD 06/10/2016, 9:51 AM PGY-1, [place] Intern pager: [phone], text pages welcome

## 2016-06-10 NOTE — Op Note (Signed)
Laparoscopic Cholecystectomy  Procedure Note  Indications: This patient presents with symptomatic gallbladder disease and will undergo laparoscopic cholecystectomy.  Pre-operative Diagnosis: Calculus of gallbladder with acute cholecystitis, without mention of obstruction  Post-operative Diagnosis: Same  Surgeon: Jakirah Zaun A.   Assistants: OR staff   Anesthesia: General endotracheal anesthesia and Local anesthesia 0.25.% bupivacaine  ASA Class: 3  Procedure Details  The patient was seen again in the Holding Room. The risks, benefits, complications, treatment options, and expected outcomes were discussed with the patient. The possibilities of reaction to medication, pulmonary aspiration, perforation of viscus, bleeding, recurrent infection, finding a normal gallbladder, the need for additional procedures, failure to diagnose a condition, the possible need to convert to an open procedure, and creating a complication requiring transfusion or operation were discussed with the patient. The patient and/or family concurred with the proposed plan, giving informed consent. The site of surgery properly noted/marked. The patient was taken to Operating Room, identified as Jacob [name] and the procedure verified as Laparoscopic Cholecystectomy with Intraoperative Cholangiograms. A Time Out was held and the above information confirmed.  Prior to the induction of general anesthesia, antibiotic prophylaxis was administered. General endotracheal anesthesia was then administered and tolerated well. After the induction, the abdomen was prepped in the usual sterile fashion. The patient was positioned in the supine position with the left arm comfortably tucked, along with some reverse Trendelenburg.  Local anesthetic agent was injected into the skin in the epigastrium.  Pt has an umbilical hernia repair in the past and did not have an umbilicus.  I placed a 5 mm optiview in the epigastrium under direct  vision without difficulty. Pneumoperitoneum was then created with CO2 and tolerated well without any adverse changes in the patient's vital signs. Additional trocars were introduced under direct vision with an 11 mm trocar above the umbilicus  and two 5 mm trocars in the right upper quadrant. All skin incisions were infiltrated with a local anesthetic agent before making the incision and placing the trocars.   The gallbladder was identified, the fundus grasped and retracted cephalad. Adhesions were lysed bluntly and with the electrocautery where indicated, taking care not to injure any adjacent organs or viscus. The infundibulum was grasped and retracted laterally, exposing the peritoneum overlying the triangle of Calot. This was then divided and exposed in a blunt fashion. The cystic duct was clearly identified and bluntly dissected circumferentially. The junctions of the gallbladder, cystic duct and common bile duct were clearly identified prior to the division of any linear structure.   An incision was made in the cystic duct. and the cholangiogram catheter introduced.  The duct was very small and friable.  I was concerned about perforating the cystic duct .  I could see the junction of the cystic duct and CBD.  I elected to forgo the cholangiogram.    The cystic duct was then  ligated with surgical clips  on the patient side and  clipped on the gallbladder side and divided. The cystic artery was identified, dissected free, ligated with clips and divided as well. Posterior cystic artery clipped and divided.  The gallbladder was dissected from the liver bed in retrograde fashion with the electrocautery. The gallbladder was removed with bag via 11 mm port site.   The liver bed was irrigated and inspected. Hemostasis was achieved with the electrocautery. Copious irrigation was utilized and was repeatedly aspirated until clear all particulate matter. Hemostasis was achieved with no signs of bleeding or bile  leakage.  Pneumoperitoneum was completely reduced after viewing removal of the trocars under direct vision. The wound was thoroughly irrigated and the fascia was then closed with a figure of eight suture; the skin was then closed with 4 O monocryl  and a sterile dressing of Dermabond was applied.  Instrument, sponge, and needle counts were correct at closure and at the conclusion of the case.   Findings: Cholecystitis ct Cholelithiasis  Estimated Blood Loss: Minimal         Drains: none         Total IV Fluids: 1000 mL         Specimens: Gallbladder           Complications: None; patient tolerated the procedure well.         Disposition: PACU - hemodynamically stable.         Condition: stable

## 2016-06-10 NOTE — Anesthesia Preprocedure Evaluation (Addendum)
Anesthesia Evaluation  Patient identified by MRN, date of birth, ID band Patient awake    Reviewed: Allergy & Precautions, H&P , NPO status , Patient's Chart, lab work & pertinent test results  History of Anesthesia Complications Negative for: history of anesthetic complications  Airway Mallampati: II   Neck ROM: full    Dental   Pulmonary sleep apnea ,    breath sounds clear to auscultation       Cardiovascular hypertension,  Rhythm:regular Rate:Normal     Neuro/Psych PSYCHIATRIC DISORDERS Anxiety Depression    GI/Hepatic negative GI ROS, Neg liver ROS,   Endo/Other  diabetes, Type 2Morbid obesity  Renal/GU      Musculoskeletal   Abdominal   Peds  Hematology   Anesthesia Other Findings   Reproductive/Obstetrics                            Anesthesia Physical Anesthesia Plan  ASA: III  Anesthesia Plan: General   Post-op Pain Management:    Induction: Intravenous  Airway Management Planned: Oral ETT  Additional Equipment:   Intra-op Plan:   Post-operative Plan: Extubation in OR  Informed Consent: I have reviewed the patients History and Physical, chart, labs and discussed the procedure including the risks, benefits and alternatives for the proposed anesthesia with the patient or authorized representative who has indicated his/her understanding and acceptance.     Plan Discussed with: CRNA, Anesthesiologist and Surgeon  Anesthesia Plan Comments:        Anesthesia Quick Evaluation

## 2016-06-11 DIAGNOSIS — K8020 Calculus of gallbladder without cholecystitis without obstruction: Secondary | ICD-10-CM

## 2016-06-11 LAB — BASIC METABOLIC PANEL
Anion gap: 10 (ref 5–15)
BUN: 9 mg/dL (ref 6–20)
CO2: 30 mmol/L (ref 22–32)
Calcium: 8.6 mg/dL — ABNORMAL LOW (ref 8.9–10.3)
Chloride: 96 mmol/L — ABNORMAL LOW (ref 101–111)
Creatinine, Ser: 0.95 mg/dL (ref 0.61–1.24)
GFR calc Af Amer: >60 mL/min (ref >60)
GLUCOSE: 172 mg/dL — AB (ref 65–99)
POTASSIUM: 3.9 mmol/L (ref 3.5–5.1)
Sodium: 136 mmol/L (ref 135–145)

## 2016-06-11 LAB — CBC
HCT: 39.4 % (ref 39.0–52.0)
Hemoglobin: 12.9 g/dL — ABNORMAL LOW (ref 13.0–17.0)
MCH: 27.6 pg (ref 26.0–34.0)
MCHC: 32.7 g/dL (ref 30.0–36.0)
MCV: 84.4 fL (ref 78.0–100.0)
PLATELETS: 150 10*3/uL (ref 150–400)
RBC: 4.67 MIL/uL (ref 4.22–5.81)
RDW: 13.7 % (ref 11.5–15.5)
WBC: 14.2 10*3/uL — ABNORMAL HIGH (ref 4.0–10.5)

## 2016-06-11 LAB — GLUCOSE, CAPILLARY
Glucose-Capillary: 165 mg/dL — ABNORMAL HIGH (ref 65–99)
Glucose-Capillary: 321 mg/dL — ABNORMAL HIGH (ref 65–99)

## 2016-06-11 LAB — HEMOGLOBIN A1C
HEMOGLOBIN A1C: 9.7 % — AB (ref 4.8–5.6)
MEAN PLASMA GLUCOSE: 232 mg/dL

## 2016-06-11 MED ORDER — PHENOL 1.4 % MT LIQD: 1.0000 | OROMUCOSAL | Status: DC | PRN

## 2016-06-11 MED ORDER — ASPIRIN 81 MG PO TBEC: 81.0000 mg | DELAYED_RELEASE_TABLET | Freq: Every day | ORAL | 0 refills | Status: DC

## 2016-06-11 MED ORDER — OXYCODONE-ACETAMINOPHEN 5-325 MG PO TABS: 1.0000 | ORAL_TABLET | Freq: Four times a day (QID) | ORAL | 0 refills | Status: AC | PRN

## 2016-06-11 NOTE — Anesthesia Postprocedure Evaluation (Addendum)
Anesthesia Post Note  Patient: Jacob [name]  Procedure(s) Performed: Procedure(s) (LRB): LAPAROSCOPIC CHOLECYSTECTOMY WITH INTRAOPERATIVE CHOLANGIOGRAM (N/A)  Patient location during evaluation: PACU Anesthesia Type: General Level of consciousness: awake and alert and patient cooperative Pain management: pain level controlled Vital Signs Assessment: post-procedure vital signs reviewed and stable Respiratory status: spontaneous breathing and respiratory function stable Cardiovascular status: stable Anesthetic complications: no        Last Vitals:  Vitals:   06/11/16 0507 06/11/16 1125  BP: 140/89 134/80  Pulse: 81 (!) 107  Resp: 16   Temp: 36.5 C     Last Pain:  Vitals:   06/11/16 0720  TempSrc:   PainSc: 2    Pain Goal:                 Gearlene Godsil S

## 2016-06-11 NOTE — Progress Notes (Signed)
Family Medicine Teaching Service Daily Progress Note Intern Pager: [phone]  Patient name: Jacob [name] Medical record number: [id number] Date of birth: [birth year] Age: 61 y.o. Gender: male  Primary Care Provider: Ronnie Doss, DO Consultants: General Surgery, Cardiology Code Status: Full  Pt Overview and Major Events to Date:  02/07: Patient admitted for RUQ found to have cholelithiasis 2/09: lap chole, cardiac echo completed  Assessment and Plan: Jacob [name] is a 61 y.o. male presenting with RUQ pain, found to have cholelithiasis, also with chest pain. PMH is significant for T2DM, HTN, HLD  #biliary colic without cholecystitis: Day 1 s/p lap chole. Patient tolerated surgery well. Afebrile. Pain is controlled. - following gen surg recs - maybe DC today - Cardiac monitoring - oxycodone-acetaminophen 5-325 q4H PRN pain - Tylenol q4H PRN - Zofran PRN - Blood Cx NGTD - chloraseptic mouth spray for throat irritiation s/p intubation  #Atypical chest tightness  Obstructive sleep apnea: Chronic, stable. Trop neg x4. Cardiac echo completed this admission 60-65% EF, G1DD, LVH. CPAP at home, patient not using. - Cards recs  - f/u eval OSA as outpatient - continued diabetes management - cont weight loss discussion  #Hyperlipidemia: Chronic, stable. Lipid profile this admission Chol 81, Triglycerides 240, HDL 18. Pre-hospital home regimen includes ASA 325, Rosuvostatin 20 mg - Rosuvostatin 20 mg QD  - Dec to ASA 81 mg QD this admission  #Non-insulin-dependent diabetes mellitus: Chronic, stable. Last last HbA1c 8.5 in 03/2016. Home regimen includes metformin 1000 mg BID. Glipizide noted in the computer but patient indicates he is no longer taking this. - CBG's 170s o/n - Holding home metformin - Sensitive sliding scale - A1c pending  #Hypertension: Chronic, stable. Controlled on admission. Home regimen amlodipine 10 mg qd at home, lisinopril 10 mg qd at home. - Amlodipine  10 mg QD - Lisinopril 10 mg QD  #Lower extremity neuropathy: Chronic, stable.  - Hold home meloxicam for now  FEN/GI: Cardiac/Carb modified diet, NPO at midnight incase needs surgery Prophylaxis: Lovenox SQ  Disposition: Plan for DC home today  Subjective:  No acute events overnight. Patient walking around his room, anxious to leave the hospital this AM. Notes some pain over surgical sites, worse with coughing.  Objective: Temp:  [97 F (36.1 C)-98.4 F (36.9 C)] 97.7 F (36.5 C) (02/10 0507) Pulse Rate:  [68-97] 81 (02/10 0507) Resp:  [15-25] 16 (02/10 0507) BP: (123-150)/(87-91) 140/89 (02/10 0507) SpO2:  [92 %-100 %] 92 % (02/10 0507) Weight:  [239 lb 13.8 oz (108.8 kg)] 239 lb 13.8 oz (108.8 kg) (02/10 0507)  Physical Exam: GEN: Comfortable, NAD, walking around room CARD: RRR, no m/r/g PULM: CTA bil, no W/R/R ABD: soft, +laproscopic sites clean, intact and dry, middle incision with some distension behind it, appropriately tender EXT: no LE edema  Laboratory:  Recent Labs Lab 06/09/16 0542 06/10/16 0706 06/11/16 0546  WBC 10.1 11.6* 14.2*  HGB 13.0 13.3 12.9*  HCT 39.3 39.2 39.4  PLT 175 162 150    Recent Labs Lab 06/08/16 1115 06/08/16 1921 06/09/16 0542 06/10/16 0940 06/11/16 0546  NA 137 135 138 139 136  K 4.1 3.6 4.1 3.9 3.9  CL 101 100* 102 102 96*  CO2 27 26 27 27 30   BUN 9 7 8 9 9   CREATININE 0.95 0.80 0.86 0.89 0.95  CALCIUM 9.3 8.7* 8.9 9.2 8.6*  PROT 7.1 7.2  --   --   --   BILITOT 0.7 0.6  --   --   --  ALKPHOS 72 72  --   --   --   ALT 16* 15*  --   --   --   AST 22 18  --   --   --   GLUCOSE 295* 223* 239* 184* 172*   Urinalysis    Component Value Date/Time   COLORURINE YELLOW 06/08/2016 1348   APPEARANCEUR HAZY (A) 06/08/2016 1348   LABSPEC 1.021 06/08/2016 1348   PHURINE 5.0 06/08/2016 1348   GLUCOSEU >=500 (A) 06/08/2016 1348   HGBUR NEGATIVE 06/08/2016 1348   BILIRUBINUR NEGATIVE 06/08/2016 1348   BILIRUBINUR NEG  04/08/2015 1021   KETONESUR NEGATIVE 06/08/2016 1348   PROTEINUR 100 (A) 06/08/2016 1348   UROBILINOGEN 0.2 04/08/2015 1021   UROBILINOGEN 0.2 08/10/2011 1534   NITRITE NEGATIVE 06/08/2016 1348   LEUKOCYTESUR NEGATIVE 06/08/2016 1348   Lipid Panel     Component Value Date/Time   CHOL 81 06/09/2016 0542   TRIG 240 (H) 06/09/2016 0542   HDL 18 (L) 06/09/2016 0542   CHOLHDL 4.5 06/09/2016 0542   VLDL 48 (H) 06/09/2016 0542   LDLCALC 15 06/09/2016 0542   LDLDIRECT 57 07/15/2010 2059   --Lipase: 26 (L) --Blood culture: Pending 2 --I-STAT troponin: Negative --Troponin-I: Negative 2 --FOBT: Negative --A1c: Pending  Imaging/Diagnostic Tests: NM Hepato W/EjeCT Fract (06/09/2016) Pending  DG Abdomen Acute W/Chest (06/08/2016) FINDINGS: Chest shows normal heart size. Mild tortuosity of the aorta. The right lung is clear. Chronic elevation of the left hemidiaphragm with mild chronic scarring at the left base. Two view abdominal exam shows a moderate amount of fecal matter but no evidence of ileus, obstruction or free air. Multiple gallstones noted in the right upper quadrant. Ordinary lower lumbar degenerative changes.  IMPRESSION: Multiple small gallstones. Gas pattern unremarkable, with a moderate amount of fecal matter but no sign of ileus or obstruction. Chronic elevation of the left hemidiaphragm with left base scarring.  US Abdomen Limited RUQ (06/08/2016) FINDINGS: Gallbladder: Multiple shadowing gallstones measuring up to 1.2 cm in size. No gallbladder wall thickening or pericholecystic fluid. Negative sonographic Murphy sign per the sonographer.  Common bile duct: Diameter: 5.5 mm  Liver: Diffusely increased echogenicity suggestive of steatosis with 4 cm mildly ill-defined region of decreased echogenicity adjacent to the gallbladder fossa most compatible with fatty sparing.  IMPRESSION: 1. Cholelithiasis without evidence of acute cholecystitis. 2. Hepatic  steatosis.    Everrett Coombe, MD 06/11/2016, 7:20 AM PGY-1, [place] Intern pager: [phone], text pages welcome

## 2016-06-11 NOTE — Discharge Summary (Signed)
[facility] Discharge Summary  Patient name: Jacob [name] Medical record number: [id number] Date of birth: 09-03-1955 Age: 61 y.o. Gender: male Date of Admission: 06/08/2016  Date of Discharge: 06/11/2016 Admitting Physician: Blane Ohara McDiarmid, MD  Primary Care Provider: Ronnie Doss, DO Consultants: Cardiology, General surgery  Indication for Hospitalization: biliary colic  Discharge Diagnoses/Problem List:  Patient Active Problem List   Diagnosis Date Noted  . Cholelithiasis   . Pain   . RUQ pain 06/09/2016  . Syncope and collapse 06/09/2016  . Hepatic steatosis 06/09/2016  . Biliary colic   . Calculus of gallbladder with cholecystitis without biliary obstruction   . Uncontrolled type 2 diabetes mellitus with diabetic neuropathy ([place])   . Preoperative clearance   . ACS (acute coronary syndrome) ([place]) 06/08/2016  . Erectile dysfunction 08/23/2013  . Ventral hernia with obstruction 09/09/2011  . 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 OSA (obstructive sleep apnea) 08/23/2011  . Colonic polyp 08/04/2011  . Cataract 12/01/2010  . Chest pain 07/15/2010  . Type II diabetes mellitus with neurological manifestations, uncontrolled ([place]) 10/03/2008  . HYPERLIPIDEMIA 02/19/2007  . Gout, unspecified 06/29/2006  . Obesity, Class II, BMI 35-39.9, with comorbidity 06/29/2006  . DEPRESSION, MAJOR, RECURRENT 06/29/2006  . ANXIETY 06/29/2006  . Essential hypertension 06/29/2006     Disposition: Home  Discharge Condition: Stable  Discharge Exam: Please see the exam documented on the day of discharge.  Brief Hospital Course:  Patient is a 61 year old gentleman who presented with right upper quadrant pain, and was found to have cholelithiasis on right upper quadrant ultrasound, no cholecystitis. HIDA scan was normal. Patient was seen and evaluated by surgery and decision was made to undergo laparoscopic cholecystectomy while he was here in hospital.  The patient  tolerated the surgery well. He was afebrile, vitals are stable, pain was well controlled. He was considered stable for discharge.   Because the patient also was complaining of intermittent chest pressure over the last 6 months at admission, he was cleared by cardiology prior to surgery. Troponins were negative. He underwent cardiac echo which demonstrated 60-65% ejection fraction grade 1 diastolic dysfunction and left ventricular hypertrophy. Cardiology saw the patient and recommended further outpatient evaluation of obstructive sleep apnea, decreasing aspirin to 81 mg daily, continuing diabetes management and weight loss counseling.  The patient's other chronic medical conditions were stable throughout hospitalization.  Issues for Follow Up:  1. Patient was discharged with 7 days of Percocet for surgical pain.  Significant Procedures: Laparoscopic cholecystectomy 06/10/2016  Significant Labs and Imaging:   Recent Labs Lab 06/09/16 0542 06/10/16 0706 06/11/16 0546  WBC 10.1 11.6* 14.2*  HGB 13.0 13.3 12.9*  HCT 39.3 39.2 39.4  PLT 175 162 150    Recent Labs Lab 06/08/16 1115 06/08/16 1921 06/09/16 0542 06/10/16 0940 06/11/16 0546  NA 137 135 138 139 136  K 4.1 3.6 4.1 3.9 3.9  CL 101 100* 102 102 96*  CO2 _0 GLUCOSE 295* 223* 239* 184* 172*  BUN _1 CREATININE 0.95 0.80 0.86 0.89 0.95  CALCIUM 9.3 8.7* 8.9 9.2 8.6*  ALKPHOS 72 72  --   --   --   AST 22 18  --   --   --   ALT 16* 15*  --   --   --   ALBUMIN 3.3* 3.4*  --   --   --    Nm Hepato W/eject Fract  Result Date:  06/09/2016 CLINICAL DATA:  Right upper quadrant pain, nausea, known gallstones EXAM: NUCLEAR MEDICINE HEPATOBILIARY IMAGING WITH GALLBLADDER EF TECHNIQUE: Sequential images of the abdomen were obtained out to 60 minutes following intravenous administration of radiopharmaceutical. After oral ingestion of Ensure, gallbladder ejection fraction was determined. At 60 min, normal ejection  fraction is greater than 33%. RADIOPHARMACEUTICALS:  5.2 mCi Tc-14m Choletec IV COMPARISON:  Ultrasound the abdomen of 06/08/2016 FINDINGS: Prompt uptake and biliary excretion of activity by the liver is seen. Gallbladder activity is visualized, consistent with patency of cystic duct. Biliary activity passes into small bowel, consistent with patent common bile duct. Calculated gallbladder ejection fraction is 80%. (Normal gallbladder ejection fraction with Ensure is greater than 33%.) IMPRESSION: 1. Normal nuclear medicine hepatobiliary scan. 2. Normal gallbladder ejection fraction of 80%. Some pain was noted with the administration of Ensure. Electronically Signed   By: PIvar DrapeM.D.   On: 06/09/2016 10:34   Dg Abdomen Acute W/chest  Result Date: 06/08/2016 CLINICAL DATA:  Right-sided abdominal pain radiating to the back over the last 5 days. EXAM: DG ABDOMEN ACUTE W/ 1V CHEST COMPARISON:  Ultrasound same day.  Chest radiography 08/25/2011. FINDINGS: Chest shows normal heart size. Mild tortuosity of the aorta. The right lung is clear. Chronic elevation of the left hemidiaphragm with mild chronic scarring at the left base. Two view abdominal exam shows a moderate amount of fecal matter but no evidence of ileus, obstruction or free air. Multiple gallstones noted in the right upper quadrant. Ordinary lower lumbar degenerative changes. IMPRESSION: Multiple small gallstones. Gas pattern unremarkable, with a moderate amount of fecal matter but no sign of ileus or obstruction. Chronic elevation of the left hemidiaphragm with left base scarring. Electronically Signed   By: MNelson ChimesM.D.   On: 06/08/2016 13:32   [place]oreaAbdomen Limited Ruq  Result Date: 06/08/2016 CLINICAL DATA:  Severe right upper quadrant pain since last night. EXAM: [place]DOM[place] LIMITED - RIGHT UPPER QUADRANT COMPARISON:  None. FINDINGS: Gallbladder: Multiple shadowing gallstones measuring up to 1.2 cm in size. No gallbladder wall thickening or  pericholecystic fluid. Negative sonographic Murphy sign per the sonographer. Common bile duct: Diameter: 5.5 mm Liver: Diffusely increased echogenicity suggestive of steatosis with 4 cm mildly ill-defined region of decreased echogenicity adjacent to the gallbladder fossa most compatible with fatty sparing. IMPRESSION: 1. Cholelithiasis without evidence of acute cholecystitis. 2. Hepatic steatosis. Electronically Signed   By: ALogan BoresM.D.   On: 06/08/2016 13:24    Results/Tests Pending at Time of Discharge: None  Discharge Medications:  Allergies as of 06/11/2016      Reactions   Methylprednisolone Acetate    Respiratory failure      Medication List    STOP taking these medications   aspirin 325 MG tablet Replaced by:  aspirin 81 MG EC tablet     TAKE these medications   ACCU-CHEK AVIVA PLUS w/Device Kit 1 application by Does not apply route daily.   albuterol 108 (90 Base) MCG/ACT inhaler Commonly known as:  PROVENTIL HFA;VENTOLIN HFA Inhale 2 puffs into the lungs every 6 (six) hours as needed for wheezing.   amLODipine 10 MG tablet Commonly known as:  NORVASC Take 1 tablet (10 mg total) by mouth daily before breakfast.   aspirin 81 MG EC tablet Take 1 tablet (81 mg total) by mouth daily. Replaces:  aspirin 325 MG tablet   glucose blood test strip Use as instructed   lisinopril 10 MG tablet Commonly known as:  PRINIVIL,ZESTRIL Take 1 tablet (10 mg total) by mouth daily.   metFORMIN 1000 MG tablet Commonly known as:  GLUCOPHAGE TAKE 1 TABLET BY MOUTH TWICE A DAY WITH MEALS   oxyCODONE-acetaminophen 5-325 MG tablet Commonly known as:  PERCOCET/ROXICET Take 1 tablet by mouth every 6 (six) hours as needed for moderate pain.   rosuvastatin 20 MG tablet Commonly known as:  CRESTOR Take 1 tablet (20 mg total) by mouth daily.       Discharge Instructions: Please refer to Patient Instructions section of EMR for full details.  Patient was counseled important signs  and symptoms that should prompt return to medical care, changes in medications, dietary instructions, activity restrictions, and follow up appointments.   Follow-Up Appointments: Follow-up Information    [place], DO. Go on 06/14/2016.   Specialty:  Family Medicine Why:  Go to appointment at 2:45 PM. Contact information: [address] [address] [name], [place] Follow up in 3 week(s).   Specialty:  General Surgery Why:  CALL MONDAY [phone] [id number] [id number]  [id number] information: [address] [place] [phone]          Everrett Coombe, MD 06/11/2016, 8:53 PM PGY-1, [place]

## 2016-06-11 NOTE — Progress Notes (Signed)
Discharge instructions and prescriptions provided to patient.  IV removed.  No questions at time of discharge.

## 2016-06-11 NOTE — Progress Notes (Signed)
1 Day Post-Op  Subjective: PT SORE   Objective: Vital signs in last 24 hours: Temp:  [97 F (36.1 C)-98.4 F (36.9 C)] 97.7 F (36.5 C) (02/10 0507) Pulse Rate:  [68-97] 81 (02/10 0507) Resp:  [15-25] 16 (02/10 0507) BP: (123-150)/(87-91) 140/89 (02/10 0507) SpO2:  [92 %-100 %] 92 % (02/10 0507) Weight:  [108.8 kg (239 lb 13.8 oz)] 108.8 kg (239 lb 13.8 oz) (02/10 0507) Last BM Date: 06/09/16  Intake/Output from previous day: 02/09 0701 - 02/10 0700 In: 1000 [I.V.:1000] Out: 5 [Blood:5] Intake/Output this shift: No intake/output data recorded.  Incision/Wound:HEMATOMA AT UMBILICAL PORT SITE   OTHER SITES OK   Lab Results:   Recent Labs  06/10/16 0706 06/11/16 0546  WBC 11.6* 14.2*  HGB 13.3 12.9*  HCT 39.2 39.4  PLT 162 150   BMET  Recent Labs  06/10/16 0940 06/11/16 0546  NA 139 136  K 3.9 3.9  CL 102 96*  CO2 27 30  GLUCOSE 184* 172*  BUN 9 9  CREATININE 0.89 0.95  CALCIUM 9.2 8.6*   PT/INR No results for input(s): LABPROT, INR in the last 72 hours. ABG No results for input(s): PHART, HCO3 in the last 72 hours.  Invalid input(s): PCO2, PO2  Studies/Results: Nm Hepato W/eject Fract  Result Date: 06/09/2016 CLINICAL DATA:  Right upper quadrant pain, nausea, known gallstones EXAM: NUCLEAR MEDICINE HEPATOBILIARY IMAGING WITH GALLBLADDER EF TECHNIQUE: Sequential images of the abdomen were obtained out to 60 minutes following intravenous administration of radiopharmaceutical. After oral ingestion of Ensure, gallbladder ejection fraction was determined. At 60 min, normal ejection fraction is greater than 33%. RADIOPHARMACEUTICALS:  5.2 mCi Tc-51m  Choletec IV COMPARISON:  Ultrasound the abdomen of 06/08/2016 FINDINGS: Prompt uptake and biliary excretion of activity by the liver is seen. Gallbladder activity is visualized, consistent with patency of cystic duct. Biliary activity passes into small bowel, consistent with patent common bile duct. Calculated  gallbladder ejection fraction is 80%. (Normal gallbladder ejection fraction with Ensure is greater than 33%.) IMPRESSION: 1. Normal nuclear medicine hepatobiliary scan. 2. Normal gallbladder ejection fraction of 80%. Some pain was noted with the administration of Ensure. Electronically Signed   By: Ivar Drape M.D.   On: 06/09/2016 10:34    Anti-infectives: Anti-infectives    None      Assessment/Plan: s/p Procedure(s): LAPAROSCOPIC CHOLECYSTECTOMY WITH INTRAOPERATIVE CHOLANGIOGRAM (N/A) Can go home per medical service  Ice for umbilical port site  Instructions given  Follow up with [facility] 2-3 weeks   LOS: 2 days    Meliton Samad A. 06/11/2016

## 2016-06-12 ENCOUNTER — Encounter (HOSPITAL_COMMUNITY): Payer: Self-pay | Admitting: Surgery

## 2016-06-13 LAB — CULTURE, BLOOD (ROUTINE X 2)
CULTURE: NO GROWTH
Culture: NO GROWTH

## 2016-06-14 ENCOUNTER — Encounter: Payer: Self-pay | Admitting: Family Medicine

## 2016-06-14 ENCOUNTER — Ambulatory Visit (INDEPENDENT_AMBULATORY_CARE_PROVIDER_SITE_OTHER): Payer: Medicaid Other | Admitting: Family Medicine

## 2016-06-14 VITALS — BP 150/86 | HR 63 | Temp 98.1°F | Ht 69.0 in | Wt 248.2 lb

## 2016-06-14 DIAGNOSIS — R1011 Right upper quadrant pain: Secondary | ICD-10-CM

## 2016-06-14 DIAGNOSIS — G4730 Sleep apnea, unspecified: Secondary | ICD-10-CM | POA: Diagnosis not present

## 2016-06-14 DIAGNOSIS — Z9049 Acquired absence of other specified parts of digestive tract: Secondary | ICD-10-CM | POA: Diagnosis not present

## 2016-06-14 MED ORDER — POLYETHYLENE GLYCOL 3350 17 GM/SCOOP PO POWD: 17.0000 g | Freq: Two times a day (BID) | ORAL | 1 refills | Status: DC | PRN

## 2016-06-14 NOTE — Patient Instructions (Addendum)
Thank you for coming in to clinic today.  If you are having persistent abdominal pain despite good bowel movements by this time next week, please make sure to tell your surgeon.  1. Your symptoms are consistent with Constipation, likely cause of your General Abdominal Pain / Cramping. 2. Sstart 17g or 1 capful daily, may adjust dose up or down by half a capful every few days. Recommend to take this medicine daily for next 1-2 weeks, then may need to use it longer if needed. - Goal is to have soft regular bowel movement 1-3x daily, if too runny or diarrhea, then reduce dose of the medicine  Improve water intake, hydration will help Also recommend increased vegetables, fruits, fiber intake Can try daily Metamucil or Fiber supplement at pharmacy over the counter  Follow-up if symptoms are not improving with bowel movements, or if pain worsens, develop fevers, nausea, vomiting.  If you have any other questions or concerns, please feel free to call the clinic to contact me. You may also schedule an earlier appointment if necessary.  However, if your symptoms get significantly worse, please go to the Emergency Department to seek immediate medical attention.  I think that you would greatly benefit from seeing a nutritionist.  Please call Dr Jenne Campus at [phone] to schedule an appointment.   Check out "Group[employer]" for cheaper medical massages.

## 2016-06-14 NOTE — Assessment & Plan Note (Signed)
Continues to have RUQ pain s/p chole.  [name] tolerating PO without difficulty.  No systemic signs of infection.  Surgical sites look to be well healing.  I suspect that pain is likely related to recent surgery vs constipation.  If persistent, would consider repeat RUQ imaging to assess for retained ductal stone.  Return precautions reviewed.

## 2016-06-14 NOTE — Progress Notes (Signed)
    Subjective: CC: Hospital follow up HPI: Jacob [name] is a 61 y.o. male presenting to clinic today for:  Hospital follow up Patient hospitalized from [id number] for biliary colic associated with calculus gallbladder w/ cholecystitis w/out obstruction.  He underwent laparoscopic cholecystectomy.  Additionally, he had cardiac evaluation for intermittent CP.  He was cleared by cards but they recommended for outpatient OSA eval. He reports first BM last night.  Normally goes BID.  On stool softener but not having significant stools.  He reports that pain is somewhat controlled but that he is only taking 1/2 tablet of the Percocet because it makes him feel funny.  He reports that he continues to have RUQ pain that is similar to what he was experiencing prior to cholecystectomy.  He notes that he also has intermittent pain on the right mid back.  He denies fevers, chills, vomiting, purulence of surgical sites.  Endorses mild TTP to surgical sites.  Endorses daytime sleepiness, increased appetite.  Denies snoring, gasping, coughing at night.  Reports poor sleep and poor energy throughout the day.  He notes that he was prescribed a CPAP machine in the past and was unable to tolerate mask.  Social Hx reviewed. MedHx, medications and allergies reviewed.  Please see EMR. ROS: Per HPI  Objective: Office vital signs reviewed. BP (!) 150/86   Pulse 63   Temp 98.1 F (36.7 C) (Oral)   Ht 5\' 9"  (1.753 m)   Wt 248 lb 3.2 oz (112.6 kg)   SpO2 97%   BMI 36.65 kg/m   Physical Examination:  General: Awake, alert, obese, No acute distress HEENT: Normal    Eyes: EOMI, sclera white    Nose: nasal turbinates moist    Throat: moist mucus membranes Cardio: regular rate and rhythm, S1S2 heard, no murmurs appreciated Pulm: clear to auscultation bilaterally, no wheezes, rhonchi or rales; normal work of breathing on room air GI: protuberant, soft, mild TTP near surgical sites but sites are intact appear to  be well healing and demonstrate no evidence of infection, bowel sounds present x4 MSK: no midline TTP to spine. No parapspinal TTP.  Has normal gait.  Softball sized lipoma on the right posterior thorax near ribs 8-11.  Assessment/ Plan: 61 y.o. male   RUQ pain Continues to have RUQ pain s/p chole.  [name] tolerating PO without difficulty.  No systemic signs of infection.  Surgical sites look to be well healing.  I suspect that pain is likely related to recent surgery vs constipation.  If persistent, would consider repeat RUQ imaging to assess for retained ductal stone.  Return precautions reviewed.  S/P cholecystectomy Patient to schedule f/u appt with surgeon as directed.  He continues to have abdominal pain after surgery.  I have ordered Miralax to take with the stool softener, as he is not having regular BMs yet and is taking an opioid medication.  Surgical sites well appearing.  Sleep apnea, unspecified type - Split night study; Future  Janora Norlander, DO PGY-3, [facility]

## 2016-06-22 ENCOUNTER — Encounter (HOSPITAL_COMMUNITY): Payer: Self-pay | Admitting: *Deleted

## 2016-06-22 ENCOUNTER — Emergency Department (HOSPITAL_COMMUNITY)
Admission: EM | Admit: 2016-06-22 | Discharge: 2016-06-22 | Disposition: A | Discharge status: Home / Self Care | Payer: Medicaid Other | Attending: Emergency Medicine | Admitting: Emergency Medicine

## 2016-06-22 ENCOUNTER — Emergency Department (HOSPITAL_COMMUNITY): Payer: Medicaid Other

## 2016-06-22 DIAGNOSIS — Z7982 Long term (current) use of aspirin: Secondary | ICD-10-CM | POA: Insufficient documentation

## 2016-06-22 DIAGNOSIS — R1011 Right upper quadrant pain: Secondary | ICD-10-CM | POA: Insufficient documentation

## 2016-06-22 DIAGNOSIS — Z79899 Other long term (current) drug therapy: Secondary | ICD-10-CM | POA: Insufficient documentation

## 2016-06-22 DIAGNOSIS — I10 Essential (primary) hypertension: Secondary | ICD-10-CM | POA: Insufficient documentation

## 2016-06-22 DIAGNOSIS — E1140 Type 2 diabetes mellitus with diabetic neuropathy, unspecified: Secondary | ICD-10-CM | POA: Insufficient documentation

## 2016-06-22 DIAGNOSIS — Z7984 Long term (current) use of oral hypoglycemic drugs: Secondary | ICD-10-CM | POA: Diagnosis not present

## 2016-06-22 LAB — CBC
HCT: 38.5 % — ABNORMAL LOW (ref 39.0–52.0)
HEMOGLOBIN: 12.8 g/dL — AB (ref 13.0–17.0)
MCH: 27.8 pg (ref 26.0–34.0)
MCHC: 33.2 g/dL (ref 30.0–36.0)
MCV: 83.5 fL (ref 78.0–100.0)
PLATELETS: 211 10*3/uL (ref 150–400)
RBC: 4.61 MIL/uL (ref 4.22–5.81)
RDW: 13.8 % (ref 11.5–15.5)
WBC: 9.6 10*3/uL (ref 4.0–10.5)

## 2016-06-22 LAB — LIPASE, BLOOD: Lipase: 23 U/L (ref 11–51)

## 2016-06-22 LAB — COMPREHENSIVE METABOLIC PANEL
ALBUMIN: 3.7 g/dL (ref 3.5–5.0)
ALT: 17 U/L (ref 17–63)
AST: 18 U/L (ref 15–41)
Alkaline Phosphatase: 70 U/L (ref 38–126)
Anion gap: 8 (ref 5–15)
BUN: 7 mg/dL (ref 6–20)
CHLORIDE: 103 mmol/L (ref 101–111)
CO2: 28 mmol/L (ref 22–32)
CREATININE: 0.88 mg/dL (ref 0.61–1.24)
Calcium: 9.5 mg/dL (ref 8.9–10.3)
GFR calc non Af Amer: >60 mL/min (ref >60)
GLUCOSE: 218 mg/dL — AB (ref 65–99)
Potassium: 4.3 mmol/L (ref 3.5–5.1)
SODIUM: 139 mmol/L (ref 135–145)
Total Bilirubin: 0.8 mg/dL (ref 0.3–1.2)
Total Protein: 7.4 g/dL (ref 6.5–8.1)

## 2016-06-22 LAB — URINALYSIS, ROUTINE W REFLEX MICROSCOPIC
Bilirubin Urine: NEGATIVE
GLUCOSE, UA: NEGATIVE mg/dL
HGB URINE DIPSTICK: NEGATIVE
Ketones, ur: NEGATIVE mg/dL
Leukocytes, UA: NEGATIVE
Nitrite: NEGATIVE
Protein, ur: NEGATIVE mg/dL
SPECIFIC GRAVITY, URINE: 1.017 (ref 1.005–1.030)
pH: 7 (ref 5.0–8.0)

## 2016-06-22 MED ORDER — SODIUM CHLORIDE 0.9 % IV SOLN
INTRAVENOUS | Status: DC
  Administered 2016-06-22: 15:00:00 via INTRAVENOUS

## 2016-06-22 MED ORDER — FENTANYL CITRATE (PF) 100 MCG/2ML IJ SOLN
50.0000 ug | Freq: Once | INTRAMUSCULAR | Status: AC
  Administered 2016-06-22: 50 ug via INTRAVENOUS
  Filled 2016-06-22: qty 2

## 2016-06-22 MED ORDER — ONDANSETRON HCL 4 MG/2ML IJ SOLN
4.0000 mg | Freq: Once | INTRAMUSCULAR | Status: AC
  Administered 2016-06-22: 4 mg via INTRAVENOUS
  Filled 2016-06-22: qty 2

## 2016-06-22 MED ORDER — IOPAMIDOL (ISOVUE-300) INJECTION 61%
INTRAVENOUS | Status: AC
  Administered 2016-06-22: 100 mL
  Filled 2016-06-22: qty 100

## 2016-06-22 MED ORDER — HYDROCODONE-ACETAMINOPHEN 5-325 MG PO TABS: 2.0000 | ORAL_TABLET | ORAL | 0 refills | Status: DC | PRN

## 2016-06-22 NOTE — ED Notes (Signed)
Attempted to site iv x2 with no success.

## 2016-06-22 NOTE — ED Triage Notes (Signed)
Pt c/o RUQ pain onset last week, pt reports having gallbladder removed last week & was discharged home Saturday, pt reports running out of pain meds last night, denies n/v/d, pt c/o mid back pain, pt has stitches at umbilical area, incision approximated, no drainage present, A&O x4

## 2016-06-22 NOTE — Discharge Instructions (Signed)
Your CT scan showed no acute findings - you should follow up with your doctor in the next 3 days - we can only give you a couple of days of pain medicine to treat your pain.

## 2016-06-22 NOTE — ED Notes (Signed)
Patient transported to CT 

## 2016-06-22 NOTE — ED Provider Notes (Signed)
[facility] DEPT Provider Note   CSN: [id number] Arrival date & time: 06/22/16  1011   By signing my name below, I, Jacob [name], attest that this documentation has been prepared under the direction and in the presence of Jacob Chapel, MD . Electronically Signed: Ethelle Lyon [name], Scribe. 06/22/2016. 12:40 PM.   History   Chief Complaint Chief Complaint  Patient presents with  . Abdominal Pain    The history is provided by the patient. No language interpreter was used.    HPI Comments:  Jacob [name] is an obese 61 y.o. male with PMHx of Colonic Polyp, HLD, HTN, Renal Calculi, and DM, who presents to the Emergency Department complaining of gradually worsening, burning, URQ pain onset three days ago. Pt had laparoscopic colon surgery 10 days ago with persistent RUQ since that time. He notes he also had gallbladder surgery and an umbilical patch placed. [name] currently taking medications for constipation that are successful. Pt is not currently on anticoagulant or antiplatelet therapy. Pt denies N/V/D, distension, diarrhea, urgency, frequency, hematuria, dysuria, difficulty urinating, and any other associated symptoms at this time.   Past Medical History:  Diagnosis Date  . Arthritis    "hurt qwhere in my bones; especially early in the morning" (06/08/2016)  . Chronic cough 08-10-11   more than 6 yrs from sinus drainage  . 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   . Depression    depression(due to childhood issues)  . 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 Erectile dysfunction 08/23/2013  . Gout, unspecified 06/29/2006   Qualifier: Diagnosis of  By: Herma Ard    . High  cholesterol   . History of gout   . HYPERLIPIDEMIA 02/19/2007   Qualifier: Diagnosis of  By: Darylene Price MD, Elta Guadeloupe    . Hypertension 08-10-11   tx. meds  . Increased urinary frequency   . Kidney stones 08-10-11   once in past, none recent  . Methylprednisolone-induced hypoxia 08/23/2011  . Neuromuscular disorder ([place]) 08-10-11   "burning/tingling sensation In feet"  . Obesity   . Obesity, Class II, BMI 35-39.9, with comorbidity 06/29/2006  . OSA (obstructive sleep apnea)    "couldn't deal w/CPAP" (06/08/2016)  . RUQ pain 06/09/2016  . Shortness of breath 08-10-11   with increased activity only  . Syncope and collapse 06/09/2016  . Syncope and collapse 06/09/2016  . Type II diabetes mellitus ([place])   . Umbilical hernia     Patient Active Problem List   Diagnosis Date Noted  . Pain   . RUQ pain 06/09/2016  . Syncope and collapse 06/09/2016  . Hepatic steatosis 06/09/2016  . Biliary colic   . Uncontrolled type 2 diabetes mellitus with diabetic neuropathy ([place])   . Preoperative clearance   . ACS (acute coronary syndrome) ([place]) 06/08/2016  . Erectile dysfunction 08/23/2013  . Ventral hernia with obstruction 09/09/2011  . 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 OSA (obstructive sleep apnea) 08/23/2011  . Colonic polyp 08/04/2011  . Cataract 12/01/2010  . Chest pain 07/15/2010  . Type II diabetes mellitus with neurological manifestations, uncontrolled ([place]) 10/03/2008  . HYPERLIPIDEMIA 02/19/2007  . Gout, unspecified 06/29/2006  . Obesity, Class II, BMI 35-39.9, with comorbidity 06/29/2006  . DEPRESSION, MAJOR, RECURRENT 06/29/2006  . ANXIETY 06/29/2006  .  Essential hypertension 06/29/2006    Past Surgical History:  Procedure Laterality Date  . CATARACT EXTRACTION W/ INTRAOCULAR LENS  IMPLANT, BILATERAL Bilateral 08/10/2011 - ~ 2014   "right - left"  . CHOLECYSTECTOMY N/A 06/10/2016   Procedure: LAPAROSCOPIC CHOLECYSTECTOMY WITH INTRAOPERATIVE CHOLANGIOGRAM;  Surgeon: Erroll Luna, MD;   Location: [facility];  Service: General;  Laterality: N/A;  . COLONOSCOPY W/ BIOPSIES AND POLYPECTOMY  2013  . HERNIA REPAIR    . VENTRAL HERNIA REPAIR  08/22/2011   Procedure: HERNIA REPAIR VENTRAL ADULT;  Surgeon: Adin Hector, MD;  Location: [facility];  Service: General;  Laterality: N/A;  incarcerated       Home Medications    Prior to Admission medications   Medication Sig Start Date End Date Taking? Authorizing Provider  albuterol (PROVENTIL HFA;VENTOLIN HFA) 108 (90 BASE) MCG/ACT inhaler Inhale 2 puffs into the lungs every 6 (six) hours as needed for wheezing. 06/04/14  Yes Aquilla Hacker, MD  amLODipine (NORVASC) 10 MG tablet Take 1 tablet (10 mg total) by mouth daily before breakfast. 03/11/16  Yes Ashly M Gottschalk, DO  aspirin 81 MG EC tablet Take 1 tablet (81 mg total) by mouth daily. 06/11/16  Yes Tazewell Bing, DO  Blood Glucose Monitoring Suppl (ACCU-CHEK AVIVA PLUS) W/DEVICE KIT 1 application by Does not apply route daily. 04/08/15  Yes Aquilla Hacker, MD  glucose blood test strip Use as instructed 05/15/13  Yes Waldemar Dickens, MD  lisinopril (PRINIVIL,ZESTRIL) 10 MG tablet Take 1 tablet (10 mg total) by mouth daily. 06/03/15  Yes Aquilla Hacker, MD  metFORMIN (GLUCOPHAGE) 1000 MG tablet TAKE 1 TABLET BY MOUTH TWICE A DAY WITH MEALS 04/12/16  Yes Ashly M Gottschalk, DO  polyethylene glycol powder (GLYCOLAX/MIRALAX) powder Take 17 g by mouth 2 (two) times daily as needed. 06/14/16  Yes Ashly Windell Moulding, DO  HYDROcodone-acetaminophen (NORCO/VICODIN) 5-325 MG tablet Take 2 tablets by mouth every 4 (four) hours as needed. 06/22/16   Jacob Chapel, MD  rosuvastatin (CRESTOR) 20 MG tablet Take 1 tablet (20 mg total) by mouth daily. Patient not taking: Reported on 06/08/2016 03/11/16   Janora Norlander, DO    Family History Family History  Problem Relation Age of Onset  . Hyperlipidemia Mother   . Diabetes Mother   . Heart disease Father   . Hyperlipidemia Father   . Colon  cancer Neg Hx     Social History Social History  Substance Use Topics  . Smoking status: Never Smoker  . Smokeless tobacco: Never Used  . Alcohol use No     Allergies   Methylprednisolone acetate   Review of Systems Review of Systems  Gastrointestinal: Positive for abdominal pain. Negative for abdominal distention, diarrhea, nausea and vomiting.  Genitourinary: Negative for difficulty urinating, dysuria, frequency, hematuria and urgency.  All other systems reviewed and are negative.    Physical Exam Updated Vital Signs BP 148/93   Pulse 80   Temp 98.8 F (37.1 C) (Oral)   Resp 18   Ht 5' 7" (1.702 m)   Wt 256 lb (116.1 kg)   SpO2 95%   BMI 40.10 kg/m   Physical Exam  Constitutional: [name] oriented to person, place, and time. He appears well-developed and well-nourished.  HENT:  Head: Normocephalic.  Eyes: Conjunctivae are normal.  Cardiovascular: Normal rate.   Pulmonary/Chest: Effort normal.  Abdominal: Soft. He exhibits no distension. There is tenderness in the right upper quadrant. There is no guarding.  Abdominal  incisions are Clean, Dry, and Intact.  No guarding or peritoneal signs.  Ecchymosis present across lower abdomen Right more than Left.   Musculoskeletal: Normal range of motion.  Neurological: [name] alert and oriented to person, place, and time.  Skin: Skin is warm and dry.  Psychiatric: He has a normal mood and affect.  Nursing note and vitals reviewed.    ED Treatments / Results  DIAGNOSTIC STUDIES:  Oxygen Saturation is 100% on RA, normal by my interpretation.    COORDINATION OF CARE:  12:16 PM Discussed treatment plan with pt at bedside including Xray of the abdomen and pain medication, and pt agreed to plan.  abs (all labs ordered are listed, but only abnormal results are displayed) Labs Reviewed  COMPREHENSIVE METABOLIC PANEL - Abnormal; Notable for the following:       Result Value   Glucose, Bld 218 (*)    All other  components within normal limits  CBC - Abnormal; Notable for the following:    Hemoglobin 12.8 (*)    HCT 38.5 (*)    All other components within normal limits  LIPASE, BLOOD  URINALYSIS, ROUTINE W REFLEX MICROSCOPIC     Radiology Ct Abdomen Pelvis W Contrast  Result Date: 06/22/2016 CLINICAL DATA:  60-year-old male status post cholecystectomy 10 days ago with right upper quadrant abdominal pain. Initial encounter. EXAM: CT ABDOMEN AND PELVIS WITH CONTRAST TECHNIQUE: Multidetector CT imaging of the abdomen and pelvis was performed using the standard protocol following bolus administration of intravenous contrast. CONTRAST:  100mL ISOVUE-300 IOPAMIDOL (ISOVUE-300) INJECTION 61% COMPARISON:  Preoperative Nuclear medicine hepatobiliary scan 06/09/2016. CTA Abdomen and Pelvis 06/02/2004 FINDINGS: Lower chest: Mild cardiomegaly. No pericardial effusion. Chronic elevation of the left hemidiaphragm with mild associated left lung base atelectasis. Calcified right lower lobe granuloma. No pleural effusion. Hepatobiliary: Cholecystectomy surgical clips and minimal stranding in the gallbladder fossa compatible with expected postoperative appearance of cholecystectomy. No biliary ductal enlargement. Negative liver. Pancreas: Negative. Spleen: Negative. Adrenals/Urinary Tract: Normal adrenal glands. Bilateral renal enhancement and contrast excretion is normal. No abdominal free air or free fluid. Unremarkable urinary bladder. Stomach/Bowel: Decompressed distal colon. Mild diverticulosis in the sigmoid colon. Negative left colon and splenic flexure except for retained stool and some redundancy. Similar retained stool in the transverse colon and at the hepatic flexure which otherwise appear normal. The cecum is on a lax mesentery. Normal appendix. Negative terminal ileum. No dilated small bowel. Decompressed stomach. The duodenum is within normal limits. Vascular/Lymphatic: Aortoiliac calcified atherosclerosis noted.  Major arterial structures in the abdomen and pelvis are patent. Portal venous system is patent. Reproductive: Negative. Other: No pelvic free fluid.  Fat containing left inguinal hernia. Mild postoperative changes to the ventral abdominal wall including a 4-5 cm area of confluent subcutaneous intermediate density and stranding (series 2, image 53). No ventral abdominal hernia identified. No fluid collection. No subcutaneous gas. Musculoskeletal: No acute osseous abnormality identified. IMPRESSION: 1. Satisfactory intra-abdominal appearance status post cholecystectomy. 2. There is a 4-5 cm area of the density in the ventral abdomen wall subcutaneous fat which most resembles a hematoma. 3. No other acute findings. Calcified aortic atherosclerosis. 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 Mild diverticulosis of the sigmoid colon. Electronically Signed   By: H  Hall M.D.   On: 06/22/2016 15:06    Procedures Procedures (including critical care time)  Medications Ordered in ED Medications  0.9 %  sodium chloride infusion ( Intravenous New Bag/Given 06/22/16   1435)  fentaNYL (SUBLIMAZE) injection 50 mcg (50 mcg Intravenous Given 06/22/16 1435)  ondansetron (ZOFRAN) injection 4 mg (4 mg Intravenous Given 06/22/16 1435)  iopamidol (ISOVUE-300) 61 % injection (100 mLs  Contrast Given 06/22/16 1444)     Initial Impression / Assessment and Plan / ED Course  I have reviewed the triage vital signs and the nursing notes.  Pertinent labs & imaging results that were available during my care of the patient were reviewed by me and considered in my medical decision making (see chart for details).     The patient appears well, he is ambulatory, he is making urine without difficulty, his labs are unremarkable including no anemia, imaging shows no intra-abdominal hemorrhage. The patient was informed of these results and will be discharged with a small  amount of pain medication.  Final Clinical Impressions(s) / ED Diagnoses   Final diagnoses:  Right upper quadrant abdominal pain    New Prescriptions New Prescriptions   HYDROCODONE-ACETAMINOPHEN (NORCO/VICODIN) 5-325 MG TABLET    Take 2 tablets by mouth every 4 (four) hours as needed.   I personally performed the services described in this documentation, which was scribed in my presence. The recorded information has been reviewed and is accurate.        Jacob Chapel, MD 06/22/16 1540

## 2016-06-22 NOTE — ED Notes (Signed)
ED Provider at bedside. 

## 2016-06-24 ENCOUNTER — Other Ambulatory Visit: Payer: Self-pay | Admitting: *Deleted

## 2016-06-24 NOTE — Telephone Encounter (Signed)
Patient called stating he is still experiencing pain from his ED visit on 06-22-16 and would like a refill of his pain medication and even an increase of the strength if possible.  I informed patient that he would need an appointment to discuss this since he was prescribed the pain medication by her and also if he is not better he needs to be re-evaluated.  Patient voiced understanding but would prefer to check with provider first.  I also let him that it is Friday afternoon and it would not be completed today and that he would have to come pick up the script if she even wrote it.  Will forward to MD. Johnney Ou

## 2016-06-24 NOTE — Telephone Encounter (Signed)
LM for patient to call back and schedule an appointment.  Please assist him in making one to follow up on his pain.  Jazmin Hartsell,CMA

## 2016-06-28 ENCOUNTER — Ambulatory Visit (INDEPENDENT_AMBULATORY_CARE_PROVIDER_SITE_OTHER): Payer: Medicaid Other | Admitting: Family Medicine

## 2016-06-28 ENCOUNTER — Encounter: Payer: Self-pay | Admitting: Family Medicine

## 2016-06-28 VITALS — BP 130/72 | HR 85 | Temp 98.0°F | Ht 69.0 in | Wt 242.8 lb

## 2016-06-28 DIAGNOSIS — R1011 Right upper quadrant pain: Secondary | ICD-10-CM

## 2016-06-28 DIAGNOSIS — Z9049 Acquired absence of other specified parts of digestive tract: Secondary | ICD-10-CM | POA: Diagnosis not present

## 2016-06-28 MED ORDER — IBUPROFEN 600 MG PO TABS: 600.0000 mg | ORAL_TABLET | Freq: Three times a day (TID) | ORAL | 0 refills | Status: DC | PRN

## 2016-06-28 MED ORDER — HYDROCODONE-ACETAMINOPHEN 5-325 MG PO TABS: 1.0000 | ORAL_TABLET | ORAL | 0 refills | Status: DC | PRN

## 2016-06-28 MED ORDER — KETOROLAC TROMETHAMINE 30 MG/ML IJ SOLN
30.0000 mg | Freq: Once | INTRAMUSCULAR | Status: AC
  Administered 2016-06-28: 30 mg via INTRAMUSCULAR

## 2016-06-28 NOTE — [facility] Notes (Signed)
   Subjective: CC: RUQ pain [id number] [name] is a 61 y.o. male presenting to clinic today for same day appointment. PCP: Ronnie Doss, DO Concerns today include:  1. RUQ pain Patient reports persistent RUQ pain.  He reports that it is only minimally improved since surgery.  He denies nausea, vomiting, decreased PO intake, fevers, chills, dysuria, hematuria, constipation, diarrhea.  He reports that pain is worse with certain positions.  He describes pain as sharp.  He has not followed up with surgeon yet.  He states earliest appt is for 3/20 of this month.  He reports good relief of constipation with Miralax.  He was recently seen in ED for RUQ pain and had CT scan performed which revealed a 4-5cm ventral hematoma in the sub-q fat, otherwise no acute findings.  He was prescribed Norco at that time.  He notes he has been taking 1 tablet prn for pain.  He reports 50% improvement in pain with this medication.  He also has been taking Motrin 600mg  prn with mild improvement in symptoms.  Allergies  Allergen Reactions  . Methylprednisolone Acetate     Respiratory failure    Social Hx reviewed. MedHx, current medications and allergies reviewed.  Please see EMR. ROS: Per HPI  Objective: Office vital signs reviewed. BP 130/72   Pulse 85   Temp 98 F (36.7 C) (Oral)   Ht 5\' 9"  (1.753 m)   Wt 242 lb 12.8 oz (110.1 kg)   SpO2 95%   BMI 35.86 kg/m   Physical Examination:  General: Awake, alert, well nourished, No acute distress GI: protuberant, soft, mild tenderness to palpation in RUQ and epigastric areas, bowel sounds present x4, no hepatomegaly, no splenomegaly, no masses; surgical sites appear to be well healing without erythema or purulence. Skin: dry; intact; surgical sites as above.  Large ecchymosis appreciated along the inferior aspect of the abdomen.  Assessment/ Plan: 61 y.o. male   1. RUQ pain.  Patient is 18 days post op.  2/21 ED note reviewed.  2/21 CT abd report  reviewed.  No evidence of infection.  No peritoneal signs.  He appears well hydrated.  He has required 9 total days of opiate medications.  We discussed at length that opiate medications are not intended to be used long term after surgery.  Current CDC recommendations cited.  [facility] narcotic database reviewed.  No red flags.  Given that he does have improvement in pain by 50% with Norco, I have prescribed another 5 days of this.  We discussed that this would be the last fill of this type of medication.  This medication is to be used as a last resort if NSAIDs not helping.   - Reviewed post-cholecystectomy diet.  Avoid fats. - ketorolac (TORADOL) 30 MG/ML injection 30 mg; Inject 1 mL (30 mg total) into the muscle once. - Norco 5mg  1-2 tablets po q6 hours prn pain. - Motrin 600mg  q6 prn pain.  Ok to start 2/28. - Follow up with surgeon as scheduled - continue at home bowel regimen while taking opioid medications - return precautions reviewed  2. S/P cholecystectomy  Follow up prn  Janora Norlander, DO PGY-3, [facility]

## 2016-06-28 NOTE — Patient Instructions (Signed)
You received Toradol injection today for pain.  I have prescribed Ibuprofen 600mg  to take as directed for pain as well.  You can start this tomorrow.  We reviewed your CT scan of your abdomen together.  I have given you a 5 day supply of Hydrocodone.  As we discussed, this is NOT intended to be a long term medication and will be the final prescription for pain medication, as your post op pain should be improving.  Please follow up with your surgeon as scheduled.

## 2016-07-08 ENCOUNTER — Other Ambulatory Visit: Payer: Self-pay | Admitting: Family Medicine

## 2016-12-21 NOTE — Addendum Note (Signed)
Addendum  created 12/21/16 1145 by Albertha Ghee, MD   Sign clinical note

## 2017-01-16 ENCOUNTER — Other Ambulatory Visit: Payer: Self-pay | Admitting: *Deleted

## 2017-01-16 DIAGNOSIS — I10 Essential (primary) hypertension: Secondary | ICD-10-CM

## 2017-01-16 MED ORDER — METFORMIN HCL 1000 MG PO TABS: 1000.0000 mg | ORAL_TABLET | Freq: Two times a day (BID) | ORAL | 0 refills | Status: DC

## 2017-01-16 MED ORDER — ROSUVASTATIN CALCIUM 20 MG PO TABS: 20.0000 mg | ORAL_TABLET | Freq: Every day | ORAL | 3 refills | Status: DC

## 2017-01-16 MED ORDER — AMLODIPINE BESYLATE 10 MG PO TABS: 10.0000 mg | ORAL_TABLET | Freq: Every day | ORAL | 11 refills | Status: DC

## 2017-01-27 ENCOUNTER — Other Ambulatory Visit: Payer: Self-pay | Admitting: *Deleted

## 2017-01-27 DIAGNOSIS — I10 Essential (primary) hypertension: Secondary | ICD-10-CM

## 2017-01-27 MED ORDER — METFORMIN HCL 1000 MG PO TABS: 1000.0000 mg | ORAL_TABLET | Freq: Two times a day (BID) | ORAL | 0 refills | Status: DC

## 2017-01-27 MED ORDER — AMLODIPINE BESYLATE 10 MG PO TABS: 10.0000 mg | ORAL_TABLET | Freq: Every day | ORAL | 11 refills | Status: DC

## 2017-03-21 ENCOUNTER — Other Ambulatory Visit: Payer: Self-pay | Admitting: Family Medicine

## 2017-03-26 ENCOUNTER — Other Ambulatory Visit: Payer: Self-pay | Admitting: Family Medicine

## 2017-03-26 DIAGNOSIS — I10 Essential (primary) hypertension: Secondary | ICD-10-CM

## 2017-12-01 ENCOUNTER — Other Ambulatory Visit: Payer: Self-pay | Admitting: Family Medicine

## 2017-12-01 DIAGNOSIS — I10 Essential (primary) hypertension: Secondary | ICD-10-CM

## 2017-12-21 ENCOUNTER — Ambulatory Visit: Payer: Medicaid Other | Admitting: Family Medicine

## 2017-12-21 ENCOUNTER — Telehealth: Payer: Self-pay

## 2017-12-21 ENCOUNTER — Encounter: Payer: Self-pay | Admitting: Family Medicine

## 2017-12-21 VITALS — BP 140/80 | HR 84 | Temp 98.5°F | Ht 69.0 in | Wt 245.6 lb

## 2017-12-21 DIAGNOSIS — I10 Essential (primary) hypertension: Secondary | ICD-10-CM | POA: Diagnosis not present

## 2017-12-21 DIAGNOSIS — F411 Generalized anxiety disorder: Secondary | ICD-10-CM

## 2017-12-21 DIAGNOSIS — E1140 Type 2 diabetes mellitus with diabetic neuropathy, unspecified: Secondary | ICD-10-CM | POA: Diagnosis present

## 2017-12-21 DIAGNOSIS — R1011 Right upper quadrant pain: Secondary | ICD-10-CM

## 2017-12-21 DIAGNOSIS — E1165 Type 2 diabetes mellitus with hyperglycemia: Secondary | ICD-10-CM

## 2017-12-21 DIAGNOSIS — IMO0002 Reserved for concepts with insufficient information to code with codable children: Secondary | ICD-10-CM

## 2017-12-21 DIAGNOSIS — M542 Cervicalgia: Secondary | ICD-10-CM | POA: Diagnosis not present

## 2017-12-21 LAB — POCT URINALYSIS DIP (MANUAL ENTRY)
BILIRUBIN UA: NEGATIVE
Blood, UA: NEGATIVE
GLUCOSE UA: NEGATIVE mg/dL
Ketones, POC UA: NEGATIVE mg/dL
LEUKOCYTES UA: NEGATIVE
NITRITE UA: NEGATIVE
PH UA: 5.5 (ref 5.0–8.0)
Protein Ur, POC: NEGATIVE mg/dL
Spec Grav, UA: 1.02 (ref 1.010–1.025)
Urobilinogen, UA: 0.2 E.U./dL

## 2017-12-21 LAB — POCT GLYCOSYLATED HEMOGLOBIN (HGB A1C): HbA1c, POC (controlled diabetic range): 7.9 % — AB (ref 0.0–7.0)

## 2017-12-21 LAB — GLUCOSE, POCT (MANUAL RESULT ENTRY): POC Glucose: 189 mg/dl — AB (ref 70–99)

## 2017-12-21 MED ORDER — AMLODIPINE BESYLATE 10 MG PO TABS: 10.0000 mg | ORAL_TABLET | Freq: Every day | ORAL | 11 refills | Status: DC

## 2017-12-21 MED ORDER — AMITRIPTYLINE HCL 10 MG PO TABS: ORAL_TABLET | ORAL | 2 refills | Status: DC

## 2017-12-21 MED ORDER — BACLOFEN 10 MG PO TABS: 10.0000 mg | ORAL_TABLET | Freq: Three times a day (TID) | ORAL | 0 refills | Status: DC

## 2017-12-21 MED ORDER — ROSUVASTATIN CALCIUM 20 MG PO TABS: 20.0000 mg | ORAL_TABLET | Freq: Every day | ORAL | 3 refills | Status: DC

## 2017-12-21 MED ORDER — PREGABALIN 75 MG PO CAPS: 75.0000 mg | ORAL_CAPSULE | Freq: Two times a day (BID) | ORAL | 2 refills | Status: DC

## 2017-12-21 NOTE — [facility] Notes (Signed)
Subjective: Chief Complaint  Patient presents with  . Annual Exam    HPI: Jacob [name] is a 62 y.o. presenting to clinic today to discuss the following:  HTN Patient has not been taking mediations and has been out for several days. Poorly controlled at this visit. I will refill today, check blood work, and reassess at next visit. Counseled patient on importance of taking blood pressure medication. Poor diet and not exercising.  DM Patient is not checking blood sugar at home but states he is taking metformin as prescribed. Poorly controlled at today's visit and above goal at 7.9. I will arranged for him to have a diabetic counseling visit with Dr. Valentina Lucks as he has multiple issues he would like addressed today. I did educate patient about the importance of doing a better job of controlling his blood sugar. [name] drinking sodas and eating sweets regularly. I will also add another medication at his next visit. Poor diet and not exercising. Counseled patient to change his diet and begin to exercise and lose weight to improve his Diabetes or he will suffer long term complications.  Neck Pain Patient states he is having neck pain and numbness for 2-3 months and it is worse when he looks to his left. He describes the pain as a 7-8/10, intermittent, and comes with movement. He denies any recent injury or fall. He does endorse falling asleep and looking at TV for long hours with his head unsupported and he non-ergonomic positions. He has never had this before.   Abdominal Pain Patient has chronic abdominal pain that he states has been there "since my stomach surgery". Patient had gallbladder removed several years ago. Non-specific and unchanged from previous visits.  Urinary Frequency Patient states he is urinating frequently everyday. Endorses feeling like he doesn't completely void. No burning, pain, or abnormal discharge. No foul odor.  Anxiety/Depression Patient has history of anxiety and  depression and seems anxious at today's visit. He declines a [facility] visit now but was willing to take information and states he will contact [facility] to set up an appointment with them for management of his anxiety and depression. He endorses a difficult family situation and feeling lonely. GAD-7 score of 9. PHQ-9 score of 12.  He denies fever, chills, chest pain, SOB, nausea, vomiting, diarrhea, or constipation.  He does endorse dizziness  Health Maintenance: none     ROS noted in HPI.   Past Medical, Surgical, Social, and Family History Reviewed & Updated per EMR.   Pertinent Historical Findings include:   Social History   Tobacco Use  Smoking Status Never Smoker  Smokeless Tobacco Never Used    Objective: BP 140/80 (BP Location: Left Arm, Patient Position: Sitting, Cuff Size: Normal)   Pulse 84   Temp 98.5 F (36.9 C) (Oral)   Ht 5\' 9"  (1.753 m)   Wt 245 lb 9.6 oz (111.4 kg)   SpO2 96%   BMI 36.27 kg/m  Vitals and nursing notes reviewed  Physical Exam Gen: Alert and Oriented x 3, NAD HEENT: Normocephalic, atraumatic, PERRLA, EOMI, TM visible with good light reflex, non-swollen, non-erythematous turbinates, non-erythematous pharyngeal mucosa, no exudates Neck: trachea midline, no thyroidmegaly, no LAD CV: RRR, no murmurs, normal S1, S2 split Resp: CTAB, no wheezing, rales, or rhonchi, comfortable work of breathing Abd: non-distended, non-tender, soft, +bs in all four quadrants MSK: Moves all four extremities, raised arm with neck compression test negative Ext: no clubbing, cyanosis, or edema Neuro: CN II-XII  intact, No gross deficits Skin: warm, dry, intact, no rashes   Results for orders placed or performed in visit on 12/21/17 (from the past 72 hour(s))  HgB A1c     Status: Abnormal   Collection Time: 12/21/17  3:35 PM  Result Value Ref Range   Hemoglobin A1C     HbA1c POC (<> result, manual entry)     HbA1c, POC (prediabetic range)     HbA1c, POC (controlled diabetic  range) 7.9 (A) 0.0 - 7.0 %    Assessment/Plan:  Uncontrolled type 2 diabetes mellitus with diabetic neuropathy ([place]) Poorly controlled with A1c of 7.9. Patient is non-compliant with poor diet and no exercise and not checking his blood glucose at home. Will set up appointment with Dr. Valentina Lucks. He will return in 4 weeks and I will add SGLT-2 inhibitor to help with weight loss as well as better glucose control.  Anxiety state Patient appears anxious at today's visit. GAD-7 score of 9. [name] not taking any medication currently but I do believe this is contributing to his current abdominal pain and possibly neck pain.  Patient will reach out to [facility] here at [facility]. I will ensure he follow up when he returns in one month.  RUQ pain Likely chronic abdominal pain. I will obtain CMP as he has not had labs in over one year. I believe some of his pain could be related to poorly controlled anxiety/depression. However, if no improvement at next visit I will consider repeat ultrasound of RUQ to ensure a stone was retained.  Not a candidate for opioids as pain is chronic.  Essential hypertension, benign Poorly controlled. He has been out of his medications. I will obtain blood work today, he will get refills, and if still uncontrolled at next visit I will increase dose of Lisinopril.  Neck pain Most likely neck strain that was acute made worse by chronic poor posture. Advised patient to support his neck while watching TV and while sleeping. Also encouraged patient to do some stretches to help with tight muscles in his neck. Prescribed Baclofen to help as well.  Due to chronic multiple complaints of aches and pains I did consider Fibromyalgia. Depression/anxiety could also be a contributing factor. Amitriptyline 10mg  to see if it helps him.   PATIENT EDUCATION PROVIDED: See AVS    Diagnosis and plan along with any newly prescribed medication(s) were discussed in detail with  this patient today. The patient verbalized understanding and agreed with the plan. Patient advised if symptoms worsen return to clinic or ER.   Health Maintainance:   Orders Placed This Encounter  Procedures  . HgB A1c  . POCT glucose (manual entry)    Meds ordered this encounter  Medications  . amLODipine (NORVASC) 10 MG tablet    Sig: Take 1 tablet (10 mg total) by mouth daily before breakfast.    Dispense:  30 tablet    Refill:  11  . rosuvastatin (CRESTOR) 20 MG tablet    Sig: Take 1 tablet (20 mg total) by mouth daily.    Dispense:  90 tablet    Refill:  3  . baclofen (LIORESAL) 10 MG tablet    Sig: Take 1 tablet (10 mg total) by mouth 3 (three) times daily.    Dispense:  30 each    Refill:  0  . pregabalin (LYRICA) 75 MG capsule    Sig: Take 1 capsule (75 mg total) by mouth 2 (two) times daily.  Dispense:  60 capsule    Refill:  2  . amitriptyline (ELAVIL) 10 MG tablet    Sig: Take one tablet daily 1 to 3 hours before bedtime    Dispense:  30 tablet    Refill:  [place], DO 12/21/2017, 3:40 PM PGY-2 [place]

## 2017-12-21 NOTE — Telephone Encounter (Signed)
Received fax from [facility] requesting prior authorization of Pregabalin 75 mg.  Form placed in MD's box for completion along with Medicaid formulary.   Danley Danker, RN [facility] RN)

## 2017-12-21 NOTE — Patient Instructions (Addendum)
It was great to meet you today! Thank you for letting me participate in your care!  Today, we discussed your neck pain. I believe your neck pain is due to some muscle tension and stress. I recommend supporting your neck at night, using an OTC cream, and taking baclofen as needed up to 3 times per day. I also sent in a prescription for a medication called Amitriptyline for your chronic pain and for some difficulty with anxiety and feeling depressed.   I have sent in a prescription for Lyrica for your neuropathic pain.   I am obtaining some lab work for you and will call you if anything is abnormal. I will see you back in one month to see how you are doing.  Be well, Harolyn Rutherford, DO PGY-2, [facility]

## 2017-12-22 LAB — COMPREHENSIVE METABOLIC PANEL
A/G RATIO: 1.7 (ref 1.2–2.2)
ALT: 13 IU/L (ref 0–44)
AST: 11 IU/L (ref 0–40)
Albumin: 4.7 g/dL (ref 3.6–4.8)
Alkaline Phosphatase: 75 IU/L (ref 39–117)
BUN/Creatinine Ratio: 16 (ref 10–24)
BUN: 13 mg/dL (ref 8–27)
Bilirubin Total: 0.5 mg/dL (ref 0.0–1.2)
CALCIUM: 9.1 mg/dL (ref 8.6–10.2)
CO2: 24 mmol/L (ref 20–29)
Chloride: 102 mmol/L (ref 96–106)
Creatinine, Ser: 0.8 mg/dL (ref 0.76–1.27)
GFR calc Af Amer: 111 mL/min/{1.73_m2} (ref >59)
GFR, EST NON AFRICAN AMERICAN: 96 mL/min/{1.73_m2} (ref >59)
GLOBULIN, TOTAL: 2.7 g/dL (ref 1.5–4.5)
Glucose: 197 mg/dL — ABNORMAL HIGH (ref 65–99)
POTASSIUM: 4.4 mmol/L (ref 3.5–5.2)
Sodium: 142 mmol/L (ref 134–144)
Total Protein: 7.4 g/dL (ref 6.0–8.5)

## 2017-12-22 LAB — CBC WITH DIFFERENTIAL/PLATELET
Basophils Absolute: 0 10*3/uL (ref 0.0–0.2)
Basos: 0 %
EOS (ABSOLUTE): 0.2 10*3/uL (ref 0.0–0.4)
EOS: 2 %
HEMATOCRIT: 41 % (ref 37.5–51.0)
Hemoglobin: 14.2 g/dL (ref 13.0–17.7)
Immature Grans (Abs): 0 10*3/uL (ref 0.0–0.1)
Immature Granulocytes: 0 %
Lymphocytes Absolute: 1.9 10*3/uL (ref 0.7–3.1)
Lymphs: 19 %
MCH: 28.3 pg (ref 26.6–33.0)
MCHC: 34.6 g/dL (ref 31.5–35.7)
MCV: 82 fL (ref 79–97)
MONOS ABS: 0.6 10*3/uL (ref 0.1–0.9)
Monocytes: 6 %
NEUTROS ABS: 7.3 10*3/uL — AB (ref 1.4–7.0)
Neutrophils: 73 %
Platelets: 153 10*3/uL (ref 150–450)
RBC: 5.01 x10E6/uL (ref 4.14–5.80)
RDW: 14.7 % (ref 12.3–15.4)
WBC: 10.1 10*3/uL (ref 3.4–10.8)

## 2017-12-22 LAB — TSH: TSH: 1.67 u[IU]/mL (ref 0.450–4.500)

## 2017-12-22 LAB — T3, FREE: T3, Free: 2.9 pg/mL (ref 2.0–4.4)

## 2017-12-22 LAB — LIPID PANEL
CHOL/HDL RATIO: 3.6 ratio (ref 0.0–5.0)
Cholesterol, Total: 112 mg/dL (ref 100–199)
HDL: 31 mg/dL — ABNORMAL LOW (ref >39)
LDL Calculated: 27 mg/dL (ref 0–99)
Triglycerides: 271 mg/dL — ABNORMAL HIGH (ref 0–149)
VLDL Cholesterol Cal: 54 mg/dL — ABNORMAL HIGH (ref 5–40)

## 2017-12-22 NOTE — Telephone Encounter (Signed)
Prior approval for Pregabalin completed via [employer].  Med approved for 12/22/17 - 12/21/17. Prior approval # [id number]. [facility] informed.  Danley Danker, RN [facility] RN)

## 2017-12-24 DIAGNOSIS — M542 Cervicalgia: Secondary | ICD-10-CM | POA: Insufficient documentation

## 2017-12-24 NOTE — Assessment & Plan Note (Signed)
Patient appears anxious at today's visit. GAD-7 score of 9. [name] not taking any medication currently but I do believe this is contributing to his current abdominal pain and possibly neck pain.  Patient will reach out to [place] [place] here at [facility]. I will ensure he follow up when he returns in one month.

## 2017-12-24 NOTE — Assessment & Plan Note (Signed)
Poorly controlled with A1c of 7.9. Patient is non-compliant with poor diet and no exercise and not checking his blood glucose at home. Will set up appointment with Dr. Valentina Lucks. He will return in 4 weeks and I will add SGLT-2 inhibitor to help with weight loss as well as better glucose control.

## 2017-12-24 NOTE — Assessment & Plan Note (Signed)
Likely chronic abdominal pain. I will obtain CMP as he has not had labs in over one year. I believe some of his pain could be related to poorly controlled anxiety/depression. However, if no improvement at next visit I will consider repeat ultrasound of RUQ to ensure a stone was retained.  Not a candidate for opioids as pain is chronic.

## 2017-12-24 NOTE — Assessment & Plan Note (Addendum)
Most likely neck strain that was acute made worse by chronic poor posture. Advised patient to support his neck while watching TV and while sleeping. Also encouraged patient to do some stretches to help with tight muscles in his neck. Prescribed Baclofen to help as well.  Due to chronic multiple complaints of aches and pains I did consider Fibromyalgia. Depression/anxiety could also be a contributing factor. Amitriptyline 10mg  to see if it helps him.

## 2017-12-24 NOTE — Assessment & Plan Note (Signed)
Poorly controlled. He has been out of his medications. I will obtain blood work today, he will get refills, and if still uncontrolled at next visit I will increase dose of Lisinopril.

## 2018-02-06 ENCOUNTER — Ambulatory Visit (INDEPENDENT_AMBULATORY_CARE_PROVIDER_SITE_OTHER): Payer: Medicaid Other | Admitting: Family Medicine

## 2018-02-06 VITALS — BP 140/80 | HR 87 | Temp 98.1°F | Wt 247.0 lb

## 2018-02-06 DIAGNOSIS — IMO0002 Reserved for concepts with insufficient information to code with codable children: Secondary | ICD-10-CM

## 2018-02-06 DIAGNOSIS — E1140 Type 2 diabetes mellitus with diabetic neuropathy, unspecified: Secondary | ICD-10-CM | POA: Diagnosis not present

## 2018-02-06 DIAGNOSIS — Z23 Encounter for immunization: Secondary | ICD-10-CM

## 2018-02-06 DIAGNOSIS — E1165 Type 2 diabetes mellitus with hyperglycemia: Secondary | ICD-10-CM

## 2018-02-06 DIAGNOSIS — R350 Frequency of micturition: Secondary | ICD-10-CM | POA: Diagnosis not present

## 2018-02-06 LAB — POCT GLYCOSYLATED HEMOGLOBIN (HGB A1C): HBA1C, POC (CONTROLLED DIABETIC RANGE): 8.8 % — AB (ref 0.0–7.0)

## 2018-02-06 MED ORDER — TAMSULOSIN HCL 0.4 MG PO CAPS: 0.4000 mg | ORAL_CAPSULE | Freq: Every day | ORAL | 3 refills | Status: DC

## 2018-02-06 MED ORDER — SITAGLIPTIN PHOSPHATE 100 MG PO TABS: 100.0000 mg | ORAL_TABLET | Freq: Every day | ORAL | 5 refills | Status: DC

## 2018-02-06 NOTE — Progress Notes (Signed)
Subjective: Chief Complaint  Patient presents with  . Polyuria  . skin tag removal  . Flu Vaccine    HPI: Jacob [name] is a 62 y.o. presenting to clinic today to discuss the following:  Poorly controlled T2DM Patient presents for follow up for his T2DM. [name] still not taking his Metformin regularly and admits to continue to eat sugary sweets, candy, sugary drinks, and eat late at night. [name] not maintaining a balanced healthy diet and is not exercising regularly.  We discussed the importance of exercise, eating healthy, not eating before bed, and taking medications regularly every day and checking his blood sugar regularly.  Urinary Frequency He states he is still having urinary frequency this dark yellow urine but no foul odor, no pain, and no burning. He states he has "never had a strong stream" and denies feelings of incomplete emptying. He does wake up at night frequently. He endorses staying up late and drinking sugary drinks and sodas until 3am. He also consumes caffinated beverages.  Health Maintenance: Influenza vaccine administered today  ROS noted in HPI.   Past Medical, Surgical, Social, and Family History Reviewed & Updated per EMR.   Pertinent Historical Findings include:   Social History   Tobacco Use  Smoking Status Never Smoker  Smokeless Tobacco Never Used    Objective: BP 140/80 (BP Location: Left Arm, Patient Position: Sitting, Cuff Size: Large)   Pulse 87   Temp 98.1 F (36.7 C) (Oral)   Wt 247 lb (112 kg)   SpO2 98%   BMI 36.48 kg/m  Vitals and nursing notes reviewed  Physical Exam Gen: Alert and Oriented x 3, NAD HEENT: Normocephalic, atraumatic, PERRLA, EOMI Neck: trachea midline, no thyroidmegaly, no LAD CV: RRR, no murmurs, normal S1, S2 split Resp: CTAB, no wheezing, rales, or rhonchi, comfortable work of breathing Abd: non-distended, non-tender, soft, +bs in all four quadrants MSK: Moves all four extremities Ext: no clubbing,  cyanosis, or edema Neuro: No gross deficits Skin: warm, dry, intact, no rashes   No results found for this or any previous visit (from the past 72 hour(s)).  Assessment/Plan:  Uncontrolled type 2 diabetes mellitus with diabetic neuropathy ([place]) Poorly controlled with A1c increased from previous visit up to 8.8 at today's visit.  - Reinforced need to take Metformin 1000mg  BID every day - Started Januvia 100mg ; did not start SGLT-2 inhibitor due to continued urinary symptoms - Advised to stop consuming sweets and to stop all together consuming sodas, sweet tea, juice, etc.  - Advised to start exercising 5 times per week at least 30 minutes per day as optimal but starting anything will be beneficial.  Urinary frequency Etiologies considered include UTI vs BPH vs Prostatitis vs secondary cause due to T2DM being poorly controlled. Given his history of consuming sodas late at night before bed, no weak stream or feeling of incomplete voiding, no pain or burning on urination, and no fever or chills, most likely this is a symptoms of his poorly controlled diabetes. U/A from last visit was negative and his symptoms have not changed from last visit. - Encouraged patient to get better control of his diabetes with taking medications and diet modification and stop consuming sodas at night and before bed. - Did start patient on a trial of Flomax in the event some of his symptoms are being caused by BPH.  Follow up with Dr. Valentina Lucks  Follow up with me in 3 months.  PATIENT EDUCATION PROVIDED: See  AVS    Diagnosis and plan along with any newly prescribed medication(s) were discussed in detail with this patient today. The patient verbalized understanding and agreed with the plan. Patient advised if symptoms worsen return to clinic or ER.   Health Maintainance:   Orders Placed This Encounter  Procedures  . Flu Vaccine QUAD 36+ mos IM  . HgB A1c    Meds ordered this encounter  Medications  .  sitaGLIPtin (JANUVIA) 100 MG tablet    Sig: Take 1 tablet (100 mg total) by mouth daily.    Dispense:  30 tablet    Refill:  5  . tamsulosin (FLOMAX) 0.4 MG CAPS capsule    Sig: Take 1 capsule (0.4 mg total) by mouth daily.    Dispense:  30 capsule    Refill:  [place], DO 02/06/2018, 3:25 PM PGY-2 [place]

## 2018-02-06 NOTE — Patient Instructions (Addendum)
It was great to see you today! Thank you for letting me participate in your care!  Today, we discussed your urinary frequency. I believe that given your symptoms and history of diabetes your urinary frequency is caused by having too much sugar in your blood. You really need to eliminate from your diet all sugary drinks such as sodas, sweet tea, snacks, candy,etc.  I have started you on a new medication for your blood sugar called Januvia. Please take it once daily. I would like you to make an appointment with Dr. Valentina Lucks for further diabetes management.  I have also started you on a medication called Flomax to help with urinary frequency that might be due to having prostate enlargement.  Please schedule an appointment in our Dermatology clinic to have your skin tags removed.  Be well, Harolyn Rutherford, DO PGY-2, [facility]

## 2018-02-08 ENCOUNTER — Other Ambulatory Visit (HOSPITAL_COMMUNITY)
Admission: RE | Admit: 2018-02-08 | Discharge: 2018-02-08 | Disposition: A | Discharge status: Home / Self Care | Payer: Medicaid Other | Source: Ambulatory Visit | Attending: Family Medicine | Admitting: Family Medicine

## 2018-02-08 ENCOUNTER — Ambulatory Visit (INDEPENDENT_AMBULATORY_CARE_PROVIDER_SITE_OTHER): Payer: Medicaid Other | Admitting: Family Medicine

## 2018-02-08 VITALS — BP 144/76 | HR 99 | Temp 98.7°F | Ht 69.0 in | Wt 246.6 lb

## 2018-02-08 DIAGNOSIS — IMO0002 Reserved for concepts with insufficient information to code with codable children: Secondary | ICD-10-CM

## 2018-02-08 DIAGNOSIS — L819 Disorder of pigmentation, unspecified: Secondary | ICD-10-CM | POA: Diagnosis present

## 2018-02-08 DIAGNOSIS — E1165 Type 2 diabetes mellitus with hyperglycemia: Secondary | ICD-10-CM | POA: Diagnosis not present

## 2018-02-08 DIAGNOSIS — E1149 Type 2 diabetes mellitus with other diabetic neurological complication: Secondary | ICD-10-CM | POA: Diagnosis present

## 2018-02-08 LAB — GLUCOSE, POCT (MANUAL RESULT ENTRY): POC Glucose: 263 mg/dl — AB (ref 70–99)

## 2018-02-08 NOTE — Progress Notes (Signed)
  Subjective:    Patient ID: Jacob [name], male    DOB: Dec 01, 1955, 62 y.o.   MRN: [id number]   CC:  HPI:  Nevus on right jaw. Patient concerned about nevus on her right jaw that has been there for 2 months it bleeds when he shaves.  This area is not itchy.  Hyperpigmented area on lower middle back. Patient reports that he has had a hyperpigmented areas on his lower back for many years now.  Previously he has had an area biopsied on his back which came back negative for cancer.  Patient is concerned that this may be cancer.  Would like for [place] to perform punch biopsy today.     Smoking status reviewed  ROS: 10 point ROS is otherwise negative, except as mentioned in HPI  Patient Active Problem List   Diagnosis Date Noted  . Neck pain 12/24/2017  . Pain   . RUQ pain 06/09/2016  . Syncope and collapse 06/09/2016  . Hepatic steatosis 06/09/2016  . Biliary colic   . Uncontrolled type 2 diabetes mellitus with diabetic neuropathy ([place])   . Preoperative clearance   . ACS (acute coronary syndrome) ([place]) 06/08/2016  . Erectile dysfunction 08/23/2013  . Ventral hernia with obstruction 09/09/2011  . 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 OSA (obstructive sleep apnea) 08/23/2011  . Colonic polyp 08/04/2011  . Cataract 12/01/2010  . Chest pain 07/15/2010  . Type II diabetes mellitus with neurological manifestations, uncontrolled ([place]) 10/03/2008  . HYPERLIPIDEMIA 02/19/2007  . Gout, unspecified 06/29/2006  . Obesity, Class II, BMI 35-39.9, with comorbidity 06/29/2006  . DEPRESSION, MAJOR, RECURRENT 06/29/2006  . Anxiety state 06/29/2006  . Essential hypertension, benign 06/29/2006     Objective:  BP (!) 144/76   Pulse 99   Temp 98.7 F (37.1 C) (Oral)   Ht 5\' 9"  (1.753 m)   Wt 246 lb 9.6 oz (111.9 kg)   SpO2 93%   BMI 36.42 kg/m  Vitals and nursing note reviewed  General: NAD, pleasant Extremities: no edema or cyanosis. WWP. Skin: Hyperpigmented papule on middle lower back  that is greater than 7 mm with atypical borders.(see photos) Neuro: alert and oriented, no focal deficits Psych: normal affect  Assessment & Plan:   Pigmented lesion Punch biopsy performed on pigmented lesion with pathology sent.  Consent was obtained and risk and benefits discussed with patient.  Patient in agreement to continue with punch biopsy   Local anesthetic with 1% epi applied under area and punch biopsy performed.  During procedure patient became diaphoretic and clammy and reported he felt lightheaded.  CBG at time was 263.  Patient given water and allowed to lay back and no further procedures were performed.  Patient stable prior to going home.  Will return to have benign appearing nevus on jaw removed with shave bx   [place] Davan Hark, DO Family Medicine Resident PGY-2   I was present during the history evaluation and procedure and agree with above Lind Covert

## 2018-02-09 DIAGNOSIS — R350 Frequency of micturition: Secondary | ICD-10-CM | POA: Insufficient documentation

## 2018-02-09 NOTE — Assessment & Plan Note (Addendum)
Poorly controlled with A1c increased from previous visit up to 8.8 at today's visit.  - Reinforced need to take Metformin 1000mg  BID every day - Started Januvia 100mg ; did not start SGLT-2 inhibitor due to continued urinary symptoms - Advised to stop consuming sweets and to stop all together consuming sodas, sweet tea, juice, etc.  - Advised to start exercising 5 times per week at least 30 minutes per day as optimal but starting anything will be beneficial.

## 2018-02-09 NOTE — Assessment & Plan Note (Signed)
Etiologies considered include UTI vs BPH vs Prostatitis vs secondary cause due to T2DM being poorly controlled. Given his history of consuming sodas late at night before bed, no weak stream or feeling of incomplete voiding, no pain or burning on urination, and no fever or chills, most likely this is a symptoms of his poorly controlled diabetes. U/A from last visit was negative and his symptoms have not changed from last visit. - Encouraged patient to get better control of his diabetes with taking medications and diet modification and stop consuming sodas at night and before bed. - Did start patient on a trial of Flomax in the event some of his symptoms are being caused by BPH.

## 2018-02-14 ENCOUNTER — Encounter: Payer: Self-pay | Admitting: Family Medicine

## 2018-02-14 NOTE — Progress Notes (Signed)
Will send results from biopsy to patient which showed: dysplastic nevus with mild atypia.

## 2018-02-15 LAB — HM DIABETES EYE EXAM

## 2018-03-06 ENCOUNTER — Encounter: Payer: Self-pay | Admitting: Family Medicine

## 2018-06-18 ENCOUNTER — Other Ambulatory Visit: Payer: Self-pay | Admitting: Family Medicine

## 2018-06-30 ENCOUNTER — Encounter: Payer: Self-pay | Admitting: Gastroenterology

## 2018-07-16 ENCOUNTER — Ambulatory Visit: Payer: Self-pay | Admitting: Surgery

## 2018-07-16 ENCOUNTER — Other Ambulatory Visit: Payer: Self-pay | Admitting: Family Medicine

## 2018-07-16 NOTE — H&P (Signed)
[name] Documented: 07/16/2018 2:24 PM Location: [facility] Patient #: [id number] DOB: Dec 06, 1955 Single / Language: Jacob [name] / Race: White Male  History of Present Illness Jacob [name] A. Olajuwon Fosdick MD; 07/16/2018 3:39 PM) Patient words: Patient returns for follow-up of a large lipoma over his right scapula. Skin present for a couple of years. He desires excision due to this increasing size and causing discomfort and pain. His been no redness or drainage. The patient is over his right scapula. He also has a second smaller one just above it he states.  The patient is a 63 year old male.   Problem List/Past Medical Sharyn Lull R. Brooks, CMA; 07/16/2018 2:25 PM) LIPOMA OF BACK (D17.1) STATUS POST LAPAROSCOPIC CHOLECYSTECTOMY (Z90.49)  Past Surgical History Sharyn Lull R. Brooks, CMA; 07/16/2018 2:25 PM) Colon Polyp Removal - Colonoscopy Colon Polyp Removal - Open Gallbladder Surgery - Open  Diagnostic Studies History Sharyn Lull R. Brooks, CMA; 07/16/2018 2:25 PM) Colonoscopy 1-5 years ago  Allergies Sharyn Lull R. Brooks, CMA; 07/16/2018 2:25 PM) methylPREDNISolone Ace-Lido  Medication History Sharyn Lull R. Brooks, CMA; 07/16/2018 2:25 PM) Rosuvastatin Calcium (20MG  Tablet, Oral) Active. Polyethylene Glycol 3350 (Oral) Active. MetFORMIN HCl (1000MG  Tablet, Oral) Active. Lisinopril (10MG  Tablet, Oral) Active. Ibuprofen (600MG  Tablet, Oral) Active. Aspirin (81MG  Tablet DR, Oral) Active. AmLODIPine Besylate (10MG  Tablet, Oral) Active. Proventil HFA (108 (90 Base)MCG/ACT Aerosol Soln, Inhalation) Active. Medications Reconciled  Social History Sharyn Lull R. Brooks, CMA; 07/16/2018 2:25 PM) Caffeine use Carbonated beverages. No alcohol use Tobacco use Never smoker.  Family History Sharyn Lull R. Rolena Infante, CMA; 07/16/2018 2:25 PM) First Degree Relatives No pertinent family history  Other Problems Sharyn Lull R. Brooks, CMA; 07/16/2018 2:25 PM) Back  Pain Cholelithiasis Depression Diabetes Mellitus Umbilical Hernia Repair    Vitals Sharyn Lull R. Brooks CMA; 07/16/2018 2:24 PM) 07/16/2018 2:24 PM Weight: 236.38 lb Height: 69in Body Surface Area: 2.22 m Body Mass Index: 34.91 kg/m  Pulse: 107 (Regular)  BP: 136/82 (Sitting, Left Arm, Standard)      Physical Exam (Shedrick Sarli A. Shaquna Geigle MD; 07/16/2018 3:39 PM)  Integumentary Note: 15 x 20 cm fatty mass over his right scapula. Just superior to that is a 3 x 5 cm fatty mass posterior subcutaneous and mobile. These appear to be lipoma  Chest and Lung Exam Chest and lung exam reveals -quiet, even and easy respiratory effort with no use of accessory muscles and on auscultation, normal breath sounds, no adventitious sounds and normal vocal resonance. Inspection Chest Wall - Normal. Back - normal.  Cardiovascular Cardiovascular examination reveals -on palpation PMI is normal in location and amplitude, no palpable S3 or S4. Normal cardiac borders., normal heart sounds, regular rate and rhythm with no murmurs, carotid auscultation reveals no bruits and normal pedal pulses bilaterally.  Abdomen Note: Scarring noted. Soft nontender without rebound or guarding.  Neurologic Neurologic evaluation reveals -alert and oriented x 3 with no impairment of recent or remote memory. Mental Status-Normal.  Musculoskeletal Normal Exam - Left-Upper Extremity Strength Normal and Lower Extremity Strength Normal. Normal Exam - Right-Upper Extremity Strength Normal, Lower Extremity Weakness.    Assessment & Plan (Shady Padron A. Nhyira Leano MD; 07/16/2018 3:38 PM)  LIPOMA OF BACK (D17.1) Impression: lipoma upper right back times 2 pt desires excision  Risk of bleeding, infection, seroma, use of drain, cosmetic deformity, nerve injury, vessel injury, cardiovascular event, DVT, stroke, death, organ failure. Observation discussed as well. These are quite large and feel benefit from  excision of this point.  Current Plans Pt Education - [facility] Free Text Education/Instructions: discussed  with patient and provided information. The pathophysiology of skin & subcutaneous masses was discussed. Natural history risks without surgery were discussed. I recommended surgery to remove the mass. I explained the technique of removal with use of local anesthesia & possible need for more aggressive sedation/anesthesia for patient comfort.  Risks such as bleeding, infection, wound breakdown, heart attack, death, and other risks were discussed. I noted a good likelihood this will help address the problem. Possibility that this will not correct all symptoms was explained. Possibility of regrowth/recurrence of the mass was discussed. We will work to minimize complications. Questions were answered. The patient expresses understanding & wishes to proceed with surgery

## 2018-09-06 ENCOUNTER — Ambulatory Visit: Payer: Self-pay | Admitting: Surgery

## 2018-09-10 ENCOUNTER — Telehealth: Payer: Self-pay

## 2018-09-14 NOTE — Telephone Encounter (Signed)
Late entry, patient called two days ago. Pt stated he needs his depression medication switched up, pt stated he is taking amitriptyline and this is making him more depressed. I advise a telephone visit would be best for this. Pt scheduled for phone visit next week.

## 2018-09-17 ENCOUNTER — Telehealth (INDEPENDENT_AMBULATORY_CARE_PROVIDER_SITE_OTHER): Payer: Medicaid Other | Admitting: Family Medicine

## 2018-09-17 ENCOUNTER — Telehealth: Payer: Self-pay | Admitting: *Deleted

## 2018-09-17 ENCOUNTER — Other Ambulatory Visit: Payer: Self-pay

## 2018-09-17 DIAGNOSIS — F339 Major depressive disorder, recurrent, unspecified: Secondary | ICD-10-CM | POA: Diagnosis not present

## 2018-09-17 MED ORDER — PAROXETINE HCL 20 MG PO TABS: 20.0000 mg | ORAL_TABLET | Freq: Every day | ORAL | 0 refills | Status: DC

## 2018-09-17 NOTE — Telephone Encounter (Signed)
Left message at number that was given to reach pt for tele-visit. Tried to call and do pre-call prior to doctor calling to meet with pt. April Zimmerman Rumple, CMA

## 2018-09-17 NOTE — Progress Notes (Signed)
[place] Telemedicine Visit  Patient consented to have virtual visit. Method of visit: Telephone  Encounter participants: Patient: Jacob [name] - located at home Provider: Nuala Alpha - located at [facility] Others (if applicable):  Chief Complaint: "All I want to do is sleep and cry all day"  HPI: Patient has a long history of depression. He states over the past few days his depression has gotten acutely worse. He states he was taking Amitriptyline 10mg  at night and it was not helping and he thinks it made him more depressed. I did inform patient this is not a therapeutic dose for depression and this was started to help with chronic pain. [name] not taking any other anti-depressant medications. He denies any intent or plan for self-harm or to end his life. He has no intent to harm others. He thinks his depression is worse acutely due to the declining health of his father who is currently under hospice care.   He states he has used paxil in the past and he felt like it worked.   ROS: per HPI  Pertinent PMHx: Depression, Anxiety, Chronic Abdominal Pain, DMT2  Exam:  Respiratory: NWOB, speaking in full sentences Psych: shows insight and understanding of his condition, good judgement, depressed mood, tearful in conversation.  Assessment/Plan:  Depression: Acute on Chronic Patient has depressed mood symptoms for 5 days. 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 - Referral to Psych; patient in my opinion would benefit from seeing psychiatry, being on a medication consistently and further discussing goals to better control depression and anxiety - Start Paxil 20mg  daily; he states this has helped him in the past. Not my first choice of SSRIs however will do trial given he had good response to it in the past. - Follow up in one week - Stop taking  Amitriptyline  Time spent during visit with patient: 11-15 minutes

## 2018-09-19 ENCOUNTER — Ambulatory Visit (HOSPITAL_BASED_OUTPATIENT_CLINIC_OR_DEPARTMENT_OTHER): Admit: 2018-09-19 | Payer: Medicaid Other | Admitting: Surgery

## 2018-09-19 ENCOUNTER — Encounter (HOSPITAL_BASED_OUTPATIENT_CLINIC_OR_DEPARTMENT_OTHER): Payer: Self-pay

## 2018-09-19 SURGERY — EXCISION LIPOMA
Anesthesia: General | Laterality: Right

## 2018-09-27 ENCOUNTER — Ambulatory Visit (INDEPENDENT_AMBULATORY_CARE_PROVIDER_SITE_OTHER): Payer: Medicaid Other | Admitting: Licensed Clinical Social Worker

## 2018-09-27 ENCOUNTER — Ambulatory Visit (INDEPENDENT_AMBULATORY_CARE_PROVIDER_SITE_OTHER): Payer: Medicaid Other | Admitting: Family Medicine

## 2018-09-27 ENCOUNTER — Encounter (HOSPITAL_COMMUNITY): Payer: Self-pay

## 2018-09-27 ENCOUNTER — Other Ambulatory Visit: Payer: Self-pay

## 2018-09-27 ENCOUNTER — Ambulatory Visit (INDEPENDENT_AMBULATORY_CARE_PROVIDER_SITE_OTHER): Payer: Medicaid Other

## 2018-09-27 ENCOUNTER — Ambulatory Visit (HOSPITAL_COMMUNITY)
Admission: EM | Admit: 2018-09-27 | Discharge: 2018-09-27 | Disposition: A | Discharge status: Home / Self Care | Payer: Medicaid Other | Attending: Internal Medicine | Admitting: Internal Medicine

## 2018-09-27 VITALS — BP 148/80 | HR 79 | Temp 98.4°F

## 2018-09-27 DIAGNOSIS — J069 Acute upper respiratory infection, unspecified: Secondary | ICD-10-CM

## 2018-09-27 DIAGNOSIS — F339 Major depressive disorder, recurrent, unspecified: Secondary | ICD-10-CM

## 2018-09-27 DIAGNOSIS — F32A Depression, unspecified: Secondary | ICD-10-CM

## 2018-09-27 DIAGNOSIS — F329 Major depressive disorder, single episode, unspecified: Secondary | ICD-10-CM | POA: Diagnosis not present

## 2018-09-27 MED ORDER — SERTRALINE HCL 50 MG PO TABS: 50.0000 mg | ORAL_TABLET | Freq: Every day | ORAL | 3 refills | Status: DC

## 2018-09-27 NOTE — BH Specialist Note (Signed)
Integrated Behavioral Health Initial Visit  MRN: [id number] Name: Jacob [name] Number of [name] Clinician visits: 1/6 Session Start time: 3:10  Session End time: 3:25 Total time: 15 minutes Type of Service: Integrated Behavioral Health  Warm Hand Off Completed.     Patient verbally consented to meet with [name] Consultant about presenting concerns. Review of patient's consultants notes from appropriate care team members was performed as part of care coordination referral SUBJECTIVE: Jacob [name] is a 63 y.o. male referred by CMA for concerns with elevated PHQ-9 and GAD-7. Patient is also concerned that his mental health medication is not working.   ASSESSMENT: Mood: Depressed and Affect: Appropriate Patient is currently experiencing symptoms of depression which are exacerbated by psychosocial stressors.  Patient may benefit from further assessment and brief therapeutic interventions to assist with managing his symptoms, however he is not interested at this time.  GOALS: 1. Reduce symptoms of: anxiety, depression and stress 2. Increase knowledge and/or ability of: coping skills and self-management skills  Plan:  1.Patient declined F/U and resource information from LCSW, states he will call office if he needs assistance 2. No additional social work F/U at this time. _______________________________________________________  Duration of CURRENT symptoms:on and off for many years Age of onset of first mood disturbance:[age] Impact on function:difficult functioning at home and caring for himself.  Psychiatric History - Diagnoses:anxiety and depression - Hospitalizations:  none - Pharmacotherapy: zolfot, paxil - Outpatient therapy: many years ago  Risk of harm to self or others: No plan to harm self or others Other: patient has history of trauma when he was a child Resources Utilized in the past: community mental health LIFE CONTEXT: Family and  Social: lives in his father's basement, has limited support system Strengths: wants to feel better and get his depression under control School/Work: SSI income Self-Care: none identified Life Changes:patient is concerned about his health (has OSA and not on CPAP therapy) father is sick, . Patient is concerned that he will have to move out of the basement when his father passes away and he has never lived on his on.  Concerned that he cannot care for himself. INTERVENTION: Client interviewed and appropriate assessments performed. also utilized engagement as part of my intervention.  Other interventions include:  Supportive Counseling,  Consult MD patient has long history or mental health and previously treated by psychiatry in the past.  per provider patient has been referred to psychiatry. LCSW gave provider a list of community psychiatry to share with patient.   Jacob [name], [place]   [phone] 4:29 PM

## 2018-09-27 NOTE — Discharge Instructions (Addendum)
Take OTC allergy medications daily. Drink plenty of water (at least 6 water bottles) daily to keep secretions thin.   Be sure to sleep at least 8 hours nightly. Return if you develop fever, productive cough, shortness of breath. Follow-up with your PCP about your Paxil. Follow-up with psychiatry (contact information given) for further therapy.

## 2018-09-27 NOTE — Patient Instructions (Addendum)
Thank you for coming to see me today. It was a pleasure! Today we talked about:   Hospice with grief counseling: [phone]  I have started you on zoloft instead of paxil, please stop the paxil.  You may take Zoloft 50 mg once nightly.  If you start having any thoughts of hurting yourself or other than please do not hesitate to call 911, go to the emergency room or come into the clinic during regular business hours.  Please follow-up with Dr. Garlan Fillers in 2 weeks or sooner as needed.  If you have any questions or concerns, please do not hesitate to call the office at [phone].  Take Care,   [place] Kiyla Ringler, DO  10 LITTLE Things To Do When You're Feeling Too Down To Do Anything  Take a shower. Even if you plan to stay in all day long and not see a soul, take a shower. It takes the most effort to hop in to the shower but once you do, you'll feel immediate results. It will wake you up and you'll be feeling much fresher (and cleaner too).  Brush and floss your teeth. Give your teeth a good brushing with a floss finish. It's a small task but it feels so good and you can check 'taking care of your health' off the list of things to do.  Do something small on your list. Most of [place] have some small thing we would like to get done (load of laundry, sew a button, email a friend). Doing one of these things will make you feel like you've accomplished something.  Drink water. Drinking water is easy right? It's also really beneficial for your health so keep a glass beside you all day and take sips often. It gives you energy and prevents you from boredom eating.  Do some floor exercises. The last thing you want to do is exercise but it might be just the thing you need the most. Keep it simple and do exercises that involve sitting or laying on the floor. Even the smallest of exercises release chemicals in the brain that make you feel good. Yoga stretches or core exercises are going to make you feel  good with minimal effort.  Make your bed. Making your bed takes a few minutes but it's productive and you'll feel relieved when it's done. An unmade bed is a huge visual reminder that you're having an unproductive day. Do it and consider it your housework for the day.  Put on some nice clothes. Take the sweatpants off even if you don't plan to go anywhere. Put on clothes that make you feel good. Take a look in the mirror so your brain recognizes the sweatpants have been replaced with clothes that make you look great. It's an instant confidence booster.  Wash the dishes. A pile of dirty dishes in the sink is a reflection of your mood. It's possible that if you wash up the dishes, your mood will follow suit. It's worth a try.  Cook a real meal. If you have the luxury to have a "do nothing" day, you have time to make a real meal for yourself. Make a meal that you love to eat. The process is good to get you out of the funk and the food will ensure you have more energy for tomorrow.  Write out your thoughts by hand. When you hand write, you stimulate your brain to focus on the moment that you're in so make yourself comfortable and write whatever comes  into your mind. Put those thoughts out on paper so they stop spinning around in your head. Those thoughts might be the very thing holding you down.

## 2018-09-27 NOTE — ED Provider Notes (Signed)
Jacob [name]    CSN: [id number] Arrival date & time: 09/27/18  1056     History   Chief Complaint Chief Complaint  Patient presents with   Depression    HPI Jacob [name] is a 63 y.o. male presenting for acute concern of runny nose and sore throat for the last 3 to 4 days.  Patient denies fever, chills, myalgia, decreased appetite, headache, nasal congestion, sinus pressure, eye/ear pain odynophagia, dysphagia, choking, nausea, vomiting, abdominal pain, diarrhea.  Patient has not tried anything for this.  No known alleviating or aggravating factors. Patient also notes that his Paxil is not working.  Patient states that he has been on this for 1 week, prescribed by his PCP.  Patient has not contacted his PCP about this.  States he is tried several SSRIs before.  Patient used to go to therapy, not currently doing so.  Patient states that his father is home on hospice.  Patient states "have never lived alone".  States that his stepmother is going to move away" only to find a place to stay ".  Says this has exacerbated his depression.  Denies SI/HI: Denies previous attempts.     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 meds   Increased urinary frequency    Kidney stones 08-10-11   once in past, none recent   Methylprednisolone-induced hypoxia 08/23/2011   Neuromuscular disorder ([place]) 08-10-11   "burning/tingling sensation In feet"   Obesity    Obesity, Class II, BMI 35-39.9, with comorbidity 06/29/2006   OSA (obstructive sleep apnea)    "couldn't deal w/CPAP" (06/08/2016)   RUQ pain 06/09/2016   Shortness of breath 08-10-11   with increased activity only   Syncope and collapse 06/09/2016   Syncope and collapse 06/09/2016   Type II diabetes mellitus ([place])    Umbilical hernia     Patient Active Problem List   Diagnosis Date Noted   Depression, recurrent ([place]) 09/17/2018   Urinary frequency 02/09/2018   Neck pain 12/24/2017   Pain    RUQ pain 06/09/2016   Syncope and collapse 06/09/2016   Hepatic steatosis 11/91/4782   Biliary colic    Uncontrolled type 2 diabetes mellitus with diabetic neuropathy (HCC)    Preoperative clearance    ACS (acute coronary syndrome) ([place]) 06/08/2016   Erectile dysfunction 08/23/2013   Ventral hernia with obstruction 09/09/2011   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   OSA (obstructive sleep apnea) 08/23/2011   Colonic polyp 08/04/2011   Cataract 12/01/2010   Chest pain 07/15/2010   Type II diabetes mellitus with neurological manifestations, uncontrolled ([place]) 10/03/2008  HYPERLIPIDEMIA 02/19/2007   Gout, unspecified 06/29/2006   Obesity, Class II, BMI 35-39.9, with comorbidity 06/29/2006   DEPRESSION, MAJOR, RECURRENT 06/29/2006   Anxiety state 06/29/2006   Essential hypertension, benign 06/29/2006    Past Surgical History:  Procedure Laterality Date   CATARACT EXTRACTION W/ INTRAOCULAR LENS  IMPLANT, BILATERAL Bilateral 08/10/2011 - ~ 2014   "right - left"   CHOLECYSTECTOMY N/A 06/10/2016   Procedure: LAPAROSCOPIC CHOLECYSTECTOMY WITH INTRAOPERATIVE  CHOLANGIOGRAM;  Surgeon: Erroll Luna, MD;  Location: [facility];  Service: General;  Laterality: N/A;   COLONOSCOPY W/ BIOPSIES AND POLYPECTOMY  2013   HERNIA REPAIR     VENTRAL HERNIA REPAIR  08/22/2011   Procedure: HERNIA REPAIR VENTRAL ADULT;  Surgeon: Adin Hector, MD;  Location: [facility];  Service: General;  Laterality: N/A;  incarcerated       Home Medications    Prior to Admission medications   Medication Sig Start Date End Date Taking? Authorizing Provider  albuterol (PROVENTIL HFA;VENTOLIN HFA) 108 (90 BASE) MCG/ACT inhaler Inhale 2 puffs into the lungs every 6 (six) hours as needed for wheezing. 06/04/14   Melancon, [place] Ram, MD  amLODipine (NORVASC) 10 MG tablet Take 1 tablet (10 mg total) by mouth daily before breakfast. 12/21/17   Nuala Alpha, DO  aspirin 81 MG EC tablet TAKE 1 TABLET (81 MG TOTAL) BY MOUTH DAILY. 07/08/16   Ronnie Doss M, DO  baclofen (LIORESAL) 10 MG tablet TAKE 1 TABLET BY MOUTH THREE TIMES A DAY 06/18/18   Lockamy, Timothy, DO  Blood Glucose Monitoring Suppl (ACCU-CHEK AVIVA PLUS) W/DEVICE KIT 1 application by Does not apply route daily. 04/08/15   Melancon, York Ram, MD  glucose blood test strip Use as instructed 05/15/13   Waldemar Dickens, MD  HYDROcodone-acetaminophen (NORCO/VICODIN) 5-325 MG tablet Take 1-2 tablets by mouth every 4 (four) hours as needed. 06/28/16   Janora Norlander, DO  lisinopril (PRINIVIL,ZESTRIL) 10 MG tablet Take 1 tablet (10 mg total) by mouth daily. 06/03/15   Melancon, York Ram, MD  metFORMIN (GLUCOPHAGE) 1000 MG tablet TAKE 1 TABLET BY MOUTH TWICE A DAY WITH MEALS 07/16/18   Lockamy, Timothy, DO  PARoxetine (PAXIL) 20 MG tablet Take 1 tablet (20 mg total) by mouth daily. 09/17/18   Nuala Alpha, DO  polyethylene glycol powder (GLYCOLAX/MIRALAX) powder Take 17 g by mouth 2 (two) times daily as needed. 06/14/16   Janora Norlander, DO  pregabalin (LYRICA) 75 MG capsule Take 1 capsule (75 mg total) by mouth 2 (two) times  daily. 12/21/17   Nuala Alpha, DO  rosuvastatin (CRESTOR) 20 MG tablet Take 1 tablet (20 mg total) by mouth daily. 12/21/17   Lockamy, Christia Reading, DO  sitaGLIPtin (JANUVIA) 100 MG tablet Take 1 tablet (100 mg total) by mouth daily. 02/06/18   Nuala Alpha, DO  tamsulosin (FLOMAX) 0.4 MG CAPS capsule Take 1 capsule (0.4 mg total) by mouth daily. 02/06/18   Nuala Alpha, DO    Family History Family History  Problem Relation Age of Onset   Hyperlipidemia Mother    Diabetes Mother    Heart disease Father    Hyperlipidemia Father    Colon cancer Neg Hx     Social History Social History   Tobacco Use   Smoking status: Never Smoker   Smokeless tobacco: Never Used  Substance Use Topics   Alcohol use: No   Drug use: No     Allergies   Methylprednisolone acetate   Review of Systems Review of Systems  as per HPI   Physical Exam Triage Vital Signs ED Triage Vitals [09/27/18 1123]  Enc Vitals Group     BP (!) 139/92     Pulse Rate 84     Resp 18     Temp 99.4 F (37.4 C)     Temp Source Oral     SpO2 98 %     Weight 252 lb (114.3 kg)     Height      Head Circumference      Peak Flow      Pain Score 7     Pain Loc      Pain Edu?      Excl. in [place]?    No data found.  Updated Vital Signs BP (!) 139/92 (BP Location: Right Arm)    Pulse 84    Temp 99.4 F (37.4 C) (Oral)    Resp 18    Wt 252 lb (114.3 kg)    SpO2 98%    BMI 37.21 kg/m   Visual Acuity Right Eye Distance:   Left Eye Distance:   Bilateral Distance:    Right Eye Near:   Left Eye Near:    Bilateral Near:     Physical Exam Vitals signs reviewed.  Constitutional:      General: [name] not in acute distress.    Appearance: [name] normal weight.  HENT:     Head: Normocephalic and atraumatic.     Jaw: No tenderness or pain on movement.     Right Ear: Hearing, tympanic membrane, ear canal and external ear normal.     Left Ear: Hearing, tympanic membrane, ear canal and external ear normal.      Nose:     Right Nostril: No epistaxis.     Left Nostril: No epistaxis.     Right Turbinates: Swollen. Not pale.     Left Turbinates: Swollen. Not pale.     Right Sinus: No maxillary sinus tenderness or frontal sinus tenderness.     Left Sinus: No maxillary sinus tenderness or frontal sinus tenderness.     Mouth/Throat:     Lips: Pink. No lesions.     Mouth: Mucous membranes are moist. No oral lesions.     Tongue: Tongue does not deviate from midline.     Palate: No mass.     Pharynx: Uvula midline. Posterior oropharyngeal erythema present. No pharyngeal swelling, oropharyngeal exudate or uvula swelling.     Tonsils: No tonsillar exudate or tonsillar abscesses. 2+ on the right. 2+ on the left.     Comments: Cobblestoning present in posterior pharynx Neck:     Musculoskeletal: Neck supple. No muscular tenderness.  Cardiovascular:     Rate and Rhythm: Normal rate.     Heart sounds: No murmur.  Pulmonary:     Effort: Pulmonary effort is normal. No respiratory distress.     Breath sounds: No wheezing or rales.     Comments: Breath sounds absent in the left lower quadrant Lymphadenopathy:     Cervical: No cervical adenopathy.  Neurological:     Mental Status: [name] alert.      UC Treatments / Results  Labs (all labs ordered are listed, but only abnormal results are displayed) Labs Reviewed - No data to display  EKG None  Radiology Dg Chest 2 View  Result Date: 09/27/2018 CLINICAL DATA:  Abnormal breath sounds. EXAM: CHEST - 2 VIEW COMPARISON:  06/08/2016. FINDINGS: Mediastinum and hilar structures normal. Heart size stable. Stable  atelectasis/scarring left lung base with stable elevation left hemidiaphragm. No pleural effusion or pneumothorax. No acute bony abnormality. Degenerative change thoracic spine. IMPRESSION: Stable atelectasis/scarring left lung base with stable elevation left hemidiaphragm. No acute abnormality identified. Electronically Signed   By: Marcello Moores   Register   On: 09/27/2018 12:38    Procedures Procedures (including critical care time)  Medications Ordered in UC Medications - No data to display  Initial Impression / Assessment and Plan / UC Course  I have reviewed the triage vital signs and the nursing notes.  Pertinent labs & imaging results that were available during my care of the patient were reviewed by me and considered in my medical decision making (see chart for details).     63 year old male presenting for upper respiratory symptoms.  Physical exam suggestive of viral versus allergy.  Chest x-ray performed due to absent left lower lobe lung sounds: Patient has elevated diaphragm negative for acute pathology per radiology.  Patient instructed to treat supportively.  Return precautions discussed, patient verbalized understanding. Final Clinical Impressions(s) / UC Diagnoses   Final diagnoses:  Depression, unspecified depression type  Viral upper respiratory tract infection     Discharge Instructions     Take OTC allergy medications daily. Drink plenty of water (at least 6 water bottles) daily to keep secretions thin.   Be sure to sleep at least 8 hours nightly. Return if you develop fever, productive cough, shortness of breath. Follow-up with your PCP about your Paxil. Follow-up with psychiatry (contact information given) for further therapy.    ED Prescriptions    None     Controlled Substance Prescriptions [place] Controlled Substance Registry consulted? Not Applicable   Quincy Sheehan, [place] 09/27/18 1255
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
## 2018-09-27 NOTE — ED Triage Notes (Signed)
Pt cc states he has some family issues that has him very depressed for weeks. Pt states his nose and throat is raw and painful.t states he takes Paxal and its not working at all.

## 2018-09-29 ENCOUNTER — Other Ambulatory Visit: Payer: Self-pay | Admitting: Family Medicine

## 2018-09-29 NOTE — Assessment & Plan Note (Addendum)
Patient interested in oral therapy.  Given handout on behavioral health resources and locations that accept Medicaid.  Patient advised to walk-in if he is feeling more down than normal.  Patient with PHQ 9 of 23 and gad 7 of 20 today during her visit.  Patient reports that his father is also going through hospice secondary to Alzheimer's and has palliative medicine at his house which is causing him much anxiety.  Also may lose housing.  Patient has spoken with our social worker Neoma Laming who is set him up with some housing resources.  Patient reports that he has cats and is worried that he may need to get rid of them however he may look into having them as emotional support animals.  [name] to speak with his PCP regarding emotional support animal letter. -Stop patient's Paxil given side effects -Start patient on Zoloft 50 mg nightly -Patient to follow-up in 2 weeks in order to see progress if any on Zoloft and to monitor for side effects, counseled that he may not notice effect of medication for 4-6 weeks -Patient encouraged to reach out to hospice for grief counseling, he is dealing with his father's illness -Patient given strict return precautions and when to return to the ED if he is having any thoughts of hurting himself or others however patient endorses multiple times that he has had no thoughts today in the office of SI/HI. -patient may also benefit from buspar for anxiety if he continues to not improve on zoloft  -Also of note, patient requested Norco multiple times during the visit in order to help with sleep and he was counseled that that is not an indication for that medication

## 2018-10-03 ENCOUNTER — Emergency Department (HOSPITAL_COMMUNITY): Payer: Medicaid Other

## 2018-10-03 ENCOUNTER — Other Ambulatory Visit: Payer: Self-pay

## 2018-10-03 ENCOUNTER — Ambulatory Visit (HOSPITAL_COMMUNITY)
Admission: RE | Admit: 2018-10-03 | Discharge: 2018-10-03 | Disposition: A | Discharge status: Home / Self Care | Payer: Medicaid Other | Attending: Psychiatry | Admitting: Psychiatry

## 2018-10-03 ENCOUNTER — Encounter (HOSPITAL_COMMUNITY): Payer: Self-pay | Admitting: Emergency Medicine

## 2018-10-03 ENCOUNTER — Emergency Department (HOSPITAL_COMMUNITY)
Admission: EM | Admit: 2018-10-03 | Discharge: 2018-10-04 | Disposition: A | Discharge status: Other Acute Inpatient Hospital | Payer: Medicaid Other | Attending: Emergency Medicine | Admitting: Emergency Medicine

## 2018-10-03 DIAGNOSIS — R0602 Shortness of breath: Secondary | ICD-10-CM | POA: Diagnosis not present

## 2018-10-03 DIAGNOSIS — R109 Unspecified abdominal pain: Secondary | ICD-10-CM | POA: Insufficient documentation

## 2018-10-03 DIAGNOSIS — R079 Chest pain, unspecified: Secondary | ICD-10-CM | POA: Diagnosis not present

## 2018-10-03 DIAGNOSIS — F332 Major depressive disorder, recurrent severe without psychotic features: Secondary | ICD-10-CM | POA: Diagnosis not present

## 2018-10-03 DIAGNOSIS — F32A Depression, unspecified: Secondary | ICD-10-CM

## 2018-10-03 DIAGNOSIS — F419 Anxiety disorder, unspecified: Secondary | ICD-10-CM | POA: Insufficient documentation

## 2018-10-03 DIAGNOSIS — Z0489 Encounter for examination and observation for other specified reasons: Secondary | ICD-10-CM | POA: Diagnosis present

## 2018-10-03 DIAGNOSIS — R45851 Suicidal ideations: Secondary | ICD-10-CM | POA: Diagnosis not present

## 2018-10-03 DIAGNOSIS — Z20828 Contact with and (suspected) exposure to other viral communicable diseases: Secondary | ICD-10-CM | POA: Insufficient documentation

## 2018-10-03 DIAGNOSIS — F329 Major depressive disorder, single episode, unspecified: Secondary | ICD-10-CM | POA: Insufficient documentation

## 2018-10-03 DIAGNOSIS — E1140 Type 2 diabetes mellitus with diabetic neuropathy, unspecified: Secondary | ICD-10-CM | POA: Diagnosis not present

## 2018-10-03 DIAGNOSIS — Z7982 Long term (current) use of aspirin: Secondary | ICD-10-CM | POA: Insufficient documentation

## 2018-10-03 DIAGNOSIS — R450 Nervousness: Secondary | ICD-10-CM | POA: Insufficient documentation

## 2018-10-03 DIAGNOSIS — I10 Essential (primary) hypertension: Secondary | ICD-10-CM | POA: Diagnosis not present

## 2018-10-03 DIAGNOSIS — Z79899 Other long term (current) drug therapy: Secondary | ICD-10-CM | POA: Diagnosis not present

## 2018-10-03 DIAGNOSIS — Z7984 Long term (current) use of oral hypoglycemic drugs: Secondary | ICD-10-CM | POA: Insufficient documentation

## 2018-10-03 LAB — COMPREHENSIVE METABOLIC PANEL
ALT: 24 U/L (ref 0–44)
AST: 22 U/L (ref 15–41)
Albumin: 4.1 g/dL (ref 3.5–5.0)
Alkaline Phosphatase: 65 U/L (ref 38–126)
Anion gap: 12 (ref 5–15)
BUN: 13 mg/dL (ref 8–23)
CO2: 22 mmol/L (ref 22–32)
Calcium: 9.4 mg/dL (ref 8.9–10.3)
Chloride: 106 mmol/L (ref 98–111)
Creatinine, Ser: 1.05 mg/dL (ref 0.61–1.24)
GFR calc Af Amer: >60 mL/min (ref >60)
GFR calc non Af Amer: >60 mL/min (ref >60)
Glucose, Bld: 135 mg/dL — ABNORMAL HIGH (ref 70–99)
Potassium: 4 mmol/L (ref 3.5–5.1)
Sodium: 140 mmol/L (ref 135–145)
Total Bilirubin: 1 mg/dL (ref 0.3–1.2)
Total Protein: 7.5 g/dL (ref 6.5–8.1)

## 2018-10-03 LAB — RAPID URINE DRUG SCREEN, HOSP PERFORMED
Amphetamines: NOT DETECTED
Barbiturates: NOT DETECTED
Benzodiazepines: NOT DETECTED
Cocaine: NOT DETECTED
Opiates: POSITIVE — AB
Tetrahydrocannabinol: NOT DETECTED

## 2018-10-03 LAB — TROPONIN I: Troponin I: <0.03 ng/mL (ref <0.03)

## 2018-10-03 LAB — ACETAMINOPHEN LEVEL: Acetaminophen (Tylenol), Serum: <10 ug/mL — ABNORMAL LOW (ref 10–30)

## 2018-10-03 LAB — CBC
HCT: 44.3 % (ref 39.0–52.0)
Hemoglobin: 14.7 g/dL (ref 13.0–17.0)
MCH: 27.1 pg (ref 26.0–34.0)
MCHC: 33.2 g/dL (ref 30.0–36.0)
MCV: 81.6 fL (ref 80.0–100.0)
Platelets: 229 10*3/uL (ref 150–400)
RBC: 5.43 MIL/uL (ref 4.22–5.81)
RDW: 13.4 % (ref 11.5–15.5)
WBC: 12.4 10*3/uL — ABNORMAL HIGH (ref 4.0–10.5)
nRBC: 0 % (ref 0.0–0.2)

## 2018-10-03 LAB — ETHANOL: Alcohol, Ethyl (B): <10 mg/dL (ref <10)

## 2018-10-03 LAB — SALICYLATE LEVEL: Salicylate Lvl: <7 mg/dL (ref 2.8–30.0)

## 2018-10-03 NOTE — BH Assessment (Signed)
Assessment Note  Jacob [name] is an 63 y.o. male.  -Patient is accompanied to [facility] by his sister, Delfin Gant.  He consents to having her present during assessment.  Patient is tearful throughout assessment.  He lives in the basement of his father's house with 5 cats.   He has done so for the last 27 years.  [name] on disability due to mental health problems.  Father is in hospice care.  His stepmother gave him a 2 page letter telling him that he needed to move out when father dies.  Patient is distraught about this.  He says he cannot take care of himself.  He says he has gotten so depressed that he has neglected the cat litter boxes and they are overflowing.  Patient has also neglected himself.  He says that he has not gotten out of bed much or cared for himself as he should.  Patient says he has been sleeping 12+ hours a day.    He has not taken his medications in awhile.  Patient is supposed to be on metformin but has not monitored his glucose or diet.  He says he took some oxycotin a few days ago from a past post surgery prescription.    Patient says he has thoughts of "not wanting to live anymore."  He has no current plan to kill himself however.  He has no previous suicide attempts.  Patient says he does not feel safe being by himself at this time.  He denies any HI or A/V hallucinations  Patient is tearful and talks about his mother not being good to him.  [name] not sure what he is going to do.  He says he needs a wife to help him.  He says "I don't know how to get along in the world."  Patient has no previous inpatient psychiatric experience.  He has had psychiatrists in the past but none now.  -Clinician discussed patient care with Lindon Romp, FNP.  He recommends medical clearance then admission to Observation unit at [facility].  Patient was transported to [facility].  Once cleared, nurse can call [facility] Wynonia Hazard at [phone]   Diagnosis: F33.2 MDD recurrent, severe  Past Medical History:   Past Medical History:  Diagnosis Date  . Arthritis    "hurt qwhere in my bones; especially early in the morning" (06/08/2016)  . Chronic cough 08-10-11   more than 6 yrs from sinus drainage  . 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   . Depression    depression(due to childhood issues)  . 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 Erectile dysfunction 08/23/2013  . Gout, unspecified 06/29/2006   Qualifier: Diagnosis of  By: Herma Ard    . High cholesterol   . History of gout   . HYPERLIPIDEMIA 02/19/2007   Qualifier: Diagnosis of  By: Darylene Price MD, Elta Guadeloupe    . Hypertension 08-10-11   tx. meds  . Increased urinary frequency   . Kidney stones 08-10-11   once in past, none recent  . Methylprednisolone-induced hypoxia 08/23/2011  . Neuromuscular disorder ([place]) 08-10-11   "burning/tingling sensation In feet"  . Obesity   . Obesity, Class II, BMI 35-39.9, with comorbidity 06/29/2006  . OSA (obstructive sleep apnea)    "  couldn't deal w/CPAP" (06/08/2016)  . Preoperative clearance   . RUQ pain 06/09/2016  . Shortness of breath 08-10-11   with increased activity only  . Syncope and collapse 06/09/2016  . Syncope and collapse 06/09/2016  . Type II diabetes mellitus ([place])   . Umbilical hernia     Past Surgical History:  Procedure Laterality Date  . CATARACT EXTRACTION W/ INTRAOCULAR LENS  IMPLANT, BILATERAL Bilateral 08/10/2011 - ~ 2014   "right - left"  . CHOLECYSTECTOMY N/A 06/10/2016   Procedure: LAPAROSCOPIC CHOLECYSTECTOMY WITH INTRAOPERATIVE CHOLANGIOGRAM;  Surgeon: Erroll Luna, MD;  Location: [place];  Service: General;  Laterality: N/A;  . COLONOSCOPY W/ BIOPSIES AND POLYPECTOMY  2013  . HERNIA  REPAIR    . VENTRAL HERNIA REPAIR  08/22/2011   Procedure: HERNIA REPAIR VENTRAL ADULT;  Surgeon: Adin Hector, MD;  Location: [facility];  Service: General;  Laterality: N/A;  incarcerated    Family History:  Family History  Problem Relation Age of Onset  . Hyperlipidemia Mother   . Diabetes Mother   . Heart disease Father   . Hyperlipidemia Father   . Colon cancer Neg Hx     Social History:  reports that he has never smoked. He has never used smokeless tobacco. He reports that he does not drink alcohol or use drugs.  Additional Social History:     CIWA: CIWA-Ar BP: (!) 171/103 Pulse Rate: 85 COWS:    Allergies:  Allergies  Allergen Reactions  . Methylprednisolone Acetate     Respiratory failure    Home Medications: (Not in a hospital admission)   OB/GYN Status:  No LMP for male patient.  General Assessment Data Location of Assessment: [facility] Assessment Services TTS Assessment: In system Is this a Tele or Face-to-Face Assessment?: Face-to-Face Is this an Initial Assessment or a Re-assessment for this encounter?: Initial Assessment Patient Accompanied by:: Other(Sister) Language Other than English: No Living Arrangements: Other (Comment)(Living in father's basement) What gender do you identify as?: Male Marital status: Single Pregnancy Status: No Living Arrangements: Parent Can pt return to current living arrangement?: Yes Admission Status: Voluntary Is patient capable of signing voluntary admission?: Yes Referral Source: Self/Family/Friend(Sister ) Insurance type: MCD  Medical Screening Exam ([place]) Medical Exam completed: Yes(Jason Gwenlyn Found, FNP)  Crisis Care Plan Living Arrangements: Parent Name of Psychiatrist: None Name of Therapist: None  Education Status Is patient currently in school?: No Is the patient employed, unemployed or receiving disability?: Receiving disability income  Risk to self with the past 6 months Suicidal Ideation:  Yes-Currently Present Has patient been a risk to self within the past 6 months prior to admission? : No Suicidal Intent: No Has patient had any suicidal intent within the past 6 months prior to admission? : No Is patient at risk for suicide?: No Suicidal Plan?: No Has patient had any suicidal plan within the past 6 months prior to admission? : No Access to Means: No What has been your use of drugs/alcohol within the last 12 months?: N/A Previous Attempts/Gestures: No How many times?: 0 Other Self Harm Risks: None Triggers for Past Attempts: None known Intentional Self Injurious Behavior: None Family Suicide History: No Recent stressful life event(s): Turmoil (Comment)(Father in hospice care) Persecutory voices/beliefs?: Yes Depression: Yes Depression Symptoms: Despondent, Isolating, Tearfulness, Fatigue, Guilt, Loss of interest in usual pleasures, Feeling worthless/self pity Substance abuse history and/or treatment for substance abuse?: No Suicide prevention information given to non-admitted patients: Not applicable  Risk to Others within the past  6 months Homicidal Ideation: No Does patient have any lifetime risk of violence toward others beyond the six months prior to admission? : No Thoughts of Harm to Others: No Current Homicidal Intent: No Current Homicidal Plan: No Access to Homicidal Means: No Identified Victim: No one History of harm to others?: No Assessment of Violence: None Noted Violent Behavior Description: No one Does patient have access to weapons?: No Criminal Charges Pending?: No Does patient have a court date: No Is patient on probation?: No  Psychosis Hallucinations: None noted Delusions: None noted  Mental Status Report Appearance/Hygiene: Disheveled, Unremarkable, Poor hygiene Eye Contact: Good Motor Activity: Freedom of movement, Unremarkable Speech: Logical/coherent Level of Consciousness: Alert, Crying Mood: Depressed, Anxious,  Apprehensive Affect: Anxious, Sad, Depressed Anxiety Level: Severe Thought Processes: Relevant, Coherent Judgement: Impaired Orientation: Person, Place, Situation Obsessive Compulsive Thoughts/Behaviors: None  Cognitive Functioning Concentration: Fair Memory: Recent Impaired, Remote Intact Is patient IDD: No Insight: Poor Impulse Control: Poor Appetite: Fair Have you had any weight changes? : No Change Sleep: Increased Total Hours of Sleep: 12 Vegetative Symptoms: Staying in bed, Decreased grooming, Not bathing  ADLScreening [facility] Assessment Services) Patient's cognitive ability adequate to safely complete daily activities?: Yes Patient able to express need for assistance with ADLs?: Yes Independently performs ADLs?: Yes (appropriate for developmental age)  Prior Inpatient Therapy Prior Inpatient Therapy: No  Prior Outpatient Therapy Prior Outpatient Therapy: No Does patient have an ACCT team?: No Does patient have Intensive In-House Services?  : No Does patient have [facility] services? : No Does patient have P4CC services?: No  ADL Screening (condition at time of admission) Patient's cognitive ability adequate to safely complete daily activities?: Yes Patient able to express need for assistance with ADLs?: Yes Independently performs ADLs?: Yes (appropriate for developmental age)             Advance Directives (For Healthcare) Does Patient Have a Medical Advance Directive?: No          Disposition:  Disposition Initial Assessment Completed for this Encounter: Yes Patient referred to: Other (Comment)(Med clearance at [facility])  On Site Evaluation by:   Reviewed with Physician:    Curlene Dolphin Ray 10/03/2018 11:47 PM

## 2018-10-03 NOTE — H&P (Signed)
Behavioral Health Medical Screening Exam  Jacob [name] is an 63 y.o. male.  Total Time spent with patient: 30 minutes  Psychiatric Specialty Exam: Physical Exam  Constitutional: [name] oriented to person, place, and time. He appears well-developed and well-nourished. No distress.  HENT:  Head: Normocephalic and atraumatic.  Right Ear: External ear normal.  Left Ear: External ear normal.  Eyes: Pupils are equal, round, and reactive to light. Conjunctivae are normal. Right eye exhibits no discharge. Left eye exhibits no discharge. No scleral icterus.  Respiratory: Effort normal. No respiratory distress.  Musculoskeletal: Normal range of motion.  Neurological: [name] alert and oriented to person, place, and time.  Skin: [name] not diaphoretic.  Psychiatric: His mood appears anxious. His speech is not delayed and not tangential. [name] not withdrawn and not actively hallucinating. Thought content is not paranoid and not delusional. He exhibits a depressed mood. He expresses suicidal ideation. He expresses no homicidal ideation. He expresses no suicidal plans and no homicidal plans.    Review of Systems  Constitutional: Negative for chills, diaphoresis, fever, malaise/fatigue and weight loss.  Respiratory: Positive for shortness of breath. Negative for cough.   Cardiovascular: Positive for chest pain.  Gastrointestinal: Positive for abdominal pain. Negative for diarrhea, nausea and vomiting.  Psychiatric/Behavioral: Positive for depression and suicidal ideas. Negative for hallucinations, memory loss and substance abuse. The patient is nervous/anxious. The patient does not have insomnia.     There were no vitals taken for this visit.There is no height or weight on file to calculate BMI.  General Appearance: Casual and Well Groomed  Eye Contact:  Fair  Speech:  Clear and Coherent and Normal Rate  Volume:  Normal  Mood:  Anxious, Depressed, Dysphoric, Hopeless and Worthless  Affect:  Congruent,  Depressed and Tearful  Thought Process:  Coherent, Goal Directed and Descriptions of Associations: Intact  Orientation:  Full (Time, Place, and Person)  Thought Content:  Logical and Hallucinations: None  Suicidal Thoughts:  Endorse passive suicidal thoughts. Denies intent/plan.  Homicidal Thoughts:  No  Memory:  Immediate;   Good Recent;   Fair  Judgement:  Fair  Insight:  Lacking  Psychomotor Activity:  Normal  Concentration: Concentration: Good and Attention Span: Good  Recall:  Good  Fund of Knowledge:Good  Language: Good  Akathisia:  Negative  Handed:  Right  AIMS (if indicated):     Assets:  Communication Skills Desire for Improvement Housing Leisure Time Physical Health  Sleep:       Musculoskeletal: Strength & Muscle Tone: within normal limits Gait & Station: normal   There were no vitals taken for this visit.  Recommendations:  Patient endorses chest pain of 8/10 and shortness of breath. Reports that he was evaluated at Urgent Care on 5/28. Respirations even and unlabored. Does not appear to be in any acute distress, will send to ED via [facility].   Patient will need medical clearance. Plan to return to [facility] OBS unit after medical clearance.  Rozetta Nunnery, NP 10/03/2018, 10:05 PM

## 2018-10-03 NOTE — ED Triage Notes (Signed)
Pt c/o chest pain and abdominal pain x "a while", pt also endorses depression, states he is under a lot of stress at home and he had a hard childhood. Pt tearful in triage, states "he doesn't have anything to live for".

## 2018-10-04 ENCOUNTER — Encounter (HOSPITAL_COMMUNITY): Payer: Self-pay

## 2018-10-04 ENCOUNTER — Observation Stay (HOSPITAL_COMMUNITY)
Admission: AD | Admit: 2018-10-04 | Discharge: 2018-10-04 | Disposition: A | Discharge status: Home / Self Care | Payer: Medicaid Other | Source: Intra-hospital | Attending: Psychiatry | Admitting: Psychiatry

## 2018-10-04 ENCOUNTER — Other Ambulatory Visit: Payer: Self-pay

## 2018-10-04 DIAGNOSIS — E785 Hyperlipidemia, unspecified: Secondary | ICD-10-CM | POA: Diagnosis not present

## 2018-10-04 DIAGNOSIS — Z7982 Long term (current) use of aspirin: Secondary | ICD-10-CM | POA: Insufficient documentation

## 2018-10-04 DIAGNOSIS — E7800 Pure hypercholesterolemia, unspecified: Secondary | ICD-10-CM | POA: Diagnosis not present

## 2018-10-04 DIAGNOSIS — Z7951 Long term (current) use of inhaled steroids: Secondary | ICD-10-CM | POA: Insufficient documentation

## 2018-10-04 DIAGNOSIS — M109 Gout, unspecified: Secondary | ICD-10-CM | POA: Insufficient documentation

## 2018-10-04 DIAGNOSIS — G4733 Obstructive sleep apnea (adult) (pediatric): Secondary | ICD-10-CM | POA: Diagnosis not present

## 2018-10-04 DIAGNOSIS — E119 Type 2 diabetes mellitus without complications: Secondary | ICD-10-CM | POA: Diagnosis not present

## 2018-10-04 DIAGNOSIS — Z7984 Long term (current) use of oral hypoglycemic drugs: Secondary | ICD-10-CM | POA: Diagnosis not present

## 2018-10-04 DIAGNOSIS — F330 Major depressive disorder, recurrent, mild: Secondary | ICD-10-CM | POA: Diagnosis not present

## 2018-10-04 DIAGNOSIS — Z6839 Body mass index (BMI) 39.0-39.9, adult: Secondary | ICD-10-CM | POA: Insufficient documentation

## 2018-10-04 DIAGNOSIS — R41843 Psychomotor deficit: Secondary | ICD-10-CM | POA: Diagnosis not present

## 2018-10-04 DIAGNOSIS — E669 Obesity, unspecified: Secondary | ICD-10-CM | POA: Insufficient documentation

## 2018-10-04 DIAGNOSIS — F332 Major depressive disorder, recurrent severe without psychotic features: Secondary | ICD-10-CM | POA: Diagnosis present

## 2018-10-04 DIAGNOSIS — Z79899 Other long term (current) drug therapy: Secondary | ICD-10-CM | POA: Diagnosis not present

## 2018-10-04 DIAGNOSIS — I10 Essential (primary) hypertension: Secondary | ICD-10-CM | POA: Diagnosis not present

## 2018-10-04 LAB — URINALYSIS, ROUTINE W REFLEX MICROSCOPIC
Bilirubin Urine: NEGATIVE
Glucose, UA: NEGATIVE mg/dL
Hgb urine dipstick: NEGATIVE
Ketones, ur: NEGATIVE mg/dL
Leukocytes,Ua: NEGATIVE
Nitrite: NEGATIVE
Protein, ur: NEGATIVE mg/dL
Specific Gravity, Urine: 1.023 (ref 1.005–1.030)
pH: 5 (ref 5.0–8.0)

## 2018-10-04 LAB — SARS CORONAVIRUS 2 BY RT PCR (HOSPITAL ORDER, PERFORMED IN ~~LOC~~ HOSPITAL LAB): SARS Coronavirus 2: NEGATIVE

## 2018-10-04 LAB — GLUCOSE, CAPILLARY: Glucose-Capillary: 156 mg/dL — ABNORMAL HIGH (ref 70–99)

## 2018-10-04 MED ORDER — HYDROXYZINE HCL 25 MG PO TABS: 25.0000 mg | ORAL_TABLET | Freq: Three times a day (TID) | ORAL | Status: DC | PRN

## 2018-10-04 MED ORDER — AMLODIPINE BESYLATE 5 MG PO TABS
10.0000 mg | ORAL_TABLET | Freq: Every day | ORAL | Status: DC
  Administered 2018-10-04: 10 mg via ORAL
  Filled 2018-10-04: qty 2

## 2018-10-04 MED ORDER — ALUM & MAG HYDROXIDE-SIMETH 200-200-20 MG/5ML PO SUSP: 30.0000 mL | ORAL | Status: DC | PRN

## 2018-10-04 MED ORDER — ACETAMINOPHEN 325 MG PO TABS: 650.0000 mg | ORAL_TABLET | Freq: Four times a day (QID) | ORAL | Status: DC | PRN

## 2018-10-04 MED ORDER — TAMSULOSIN HCL 0.4 MG PO CAPS
0.4000 mg | ORAL_CAPSULE | Freq: Every day | ORAL | Status: DC
  Administered 2018-10-04: 09:00:00 0.4 mg via ORAL
  Filled 2018-10-04: qty 1

## 2018-10-04 MED ORDER — LISINOPRIL 10 MG PO TABS
10.0000 mg | ORAL_TABLET | Freq: Every day | ORAL | Status: DC
  Administered 2018-10-04: 10 mg via ORAL
  Filled 2018-10-04: qty 1

## 2018-10-04 MED ORDER — PREGABALIN 75 MG PO CAPS
75.0000 mg | ORAL_CAPSULE | Freq: Two times a day (BID) | ORAL | Status: DC
  Administered 2018-10-04: 09:00:00 75 mg via ORAL
  Filled 2018-10-04: qty 1

## 2018-10-04 MED ORDER — LINAGLIPTIN 5 MG PO TABS
5.0000 mg | ORAL_TABLET | Freq: Every day | ORAL | Status: DC
  Administered 2018-10-04: 5 mg via ORAL
  Filled 2018-10-04 (×3): qty 1

## 2018-10-04 MED ORDER — ACETAMINOPHEN 325 MG PO TABS
650.0000 mg | ORAL_TABLET | Freq: Once | ORAL | Status: AC
  Administered 2018-10-04: 650 mg via ORAL
  Filled 2018-10-04: qty 2

## 2018-10-04 MED ORDER — SERTRALINE HCL 50 MG PO TABS
50.0000 mg | ORAL_TABLET | Freq: Every day | ORAL | Status: DC
  Administered 2018-10-04: 09:00:00 50 mg via ORAL
  Filled 2018-10-04: qty 1

## 2018-10-04 MED ORDER — MAGNESIUM HYDROXIDE 400 MG/5ML PO SUSP: 30.0000 mL | Freq: Every day | ORAL | Status: DC | PRN

## 2018-10-04 MED ORDER — ASPIRIN EC 81 MG PO TBEC
81.0000 mg | DELAYED_RELEASE_TABLET | Freq: Every day | ORAL | Status: DC
  Administered 2018-10-04: 81 mg via ORAL
  Filled 2018-10-04: qty 1

## 2018-10-04 MED ORDER — ROSUVASTATIN CALCIUM 5 MG PO TABS
20.0000 mg | ORAL_TABLET | Freq: Every day | ORAL | Status: DC
  Administered 2018-10-04: 20 mg via ORAL
  Filled 2018-10-04: qty 4

## 2018-10-04 NOTE — ED Notes (Signed)
Per [name] patient is medically cleared and can go back to [facility] as long as his bed is still available. This RN contacted Jacob [name] [facility] who advised that patient's bed is still available and that he can come back over to [facility] at this time. Report Called to [place], Therapist, sports at [facility].

## 2018-10-04 NOTE — H&P (Signed)
[facility] Provider Admission PAA/H&P  Patient Identification: Jacob [name] MRN:  [id number] Date of Evaluation:  10/04/2018 Chief Complaint:  Major depression disorder severe Principal Diagnosis: Severe recurrent major depression without psychotic features ([place]) Diagnosis:  Active Problems:   Severe recurrent major depression without psychotic features ([place])  History of Present Illness:    Jacob [name] is an 63 y.o. male.  -Patient is accompanied to [facility] by his sister, Jacob [name].  He consents to having her present during assessment.Patient is tearful throughout assessment.  He lives in the basement of his father's house with 5 cats.   He has done so for the last 27 years.  [name] on disability due to mental health problems.Father is in hospice care.  His stepmother gave him a 2 page letter telling him that he needed to move out when father dies.  Patient is distraught about this.  He says he cannot take care of himself.  He says he has gotten so depressed that he has neglected the cat litter boxes and they are overflowing.  Patient has also neglected himself.  He says that he has not gotten out of bed much or cared for himself as he should.  Patient says he has been sleeping 12+ hours a day.  He has not taken his medications in awhile.  Patient is supposed to be on metformin but has not monitored his glucose or diet.  He says he took some oxycotin a few days ago from a past post surgery prescription.  Patient says he has thoughts of "not wanting to live anymore."  He has no current plan to kill himself however.  He has no previous suicide attempts.  Patient says he does not feel safe being by himself at this time.He denies any HI or A/V hallucinations. Patient is tearful and talks about his mother not being good to him.  [name] not sure what he is going to do.  He says he needs a wife to help him.  He says "I don't know how to get along in the world."Patient has no previous inpatient  psychiatric experience.  He has had psychiatrists in the past but none now.   On evaluation patient is alert and oriented x 4, pleasant, and cooperative. Speech is clear and coherent. Mood is depressed and affect is congruent with mood. Tearful throughout the assessment. Thought process is coherent and thought process is logical. Denies suicidal ideations. He does endorse thoughts of death. States "Sometimes I lay in the bed and pray that I would just die." Denies homicidal ideations. Denies substance abuse. Although he states that he had and old prescription of oxycodone and has used them recently for abdominal pain. States that he continues to have pain after having surgery for a hernia. Denies audiovisual hallucinations. No indication that patient is responding to internal stimuli. Reports that he sleeps 14-18 hours per day.  Associated Signs/Symptoms: Depression Symptoms:  depressed mood, anhedonia, hypersomnia, psychomotor retardation, fatigue, feelings of worthlessness/guilt, difficulty concentrating, hopelessness, recurrent thoughts of death, panic attacks, (Hypo) Manic Symptoms:  Denies Anxiety Symptoms:  Excessive Worry, Psychotic Symptoms:  Denies PTSD Symptoms: Negative Total Time spent with patient: 30 minutes  Past Psychiatric History: MDD  Is the patient at risk to self? Yes.    Has the patient been a risk to self in the past 6 months? No.  Has the patient been a risk to self within the distant past? No.  Is the patient a risk to others?  No.  Has the patient been a risk to others in the past 6 months? No.  Has the patient been a risk to others within the distant past? No.   Prior Inpatient Therapy:   Prior Outpatient Therapy:    Alcohol Screening:   Substance Abuse History in the last 12 months:  No. Consequences of Substance Abuse: NA Previous Psychotropic Medications: Yes  Psychological Evaluations: Yes  Past Medical History:  Past Medical History:   Diagnosis Date  . Arthritis    "hurt qwhere in my bones; especially early in the morning" (06/08/2016)  . Chronic cough 08-10-11   more than 6 yrs from sinus drainage  . 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   . Depression    depression(due to childhood issues)  . 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 Erectile dysfunction 08/23/2013  . Gout, unspecified 06/29/2006   Qualifier: Diagnosis of  By: Herma Ard    . High cholesterol   . History of gout   . HYPERLIPIDEMIA 02/19/2007   Qualifier: Diagnosis of  By: Darylene Price MD, Elta Guadeloupe    . Hypertension 08-10-11   tx. meds  . Increased urinary frequency   . Kidney stones 08-10-11   once in past, none recent  . Methylprednisolone-induced hypoxia 08/23/2011  . Neuromuscular disorder ([place]) 08-10-11   "burning/tingling sensation In feet"  . Obesity   . Obesity, Class II, BMI 35-39.9, with comorbidity 06/29/2006  . OSA (obstructive sleep apnea)    "couldn't deal w/CPAP" (06/08/2016)  . Preoperative clearance   . RUQ pain 06/09/2016  . Shortness of breath 08-10-11   with increased activity only  . Syncope and collapse 06/09/2016  . Syncope and collapse 06/09/2016  . Type II diabetes mellitus ([place])   . Umbilical hernia     Past Surgical History:  Procedure Laterality Date  . CATARACT EXTRACTION W/ INTRAOCULAR LENS  IMPLANT, BILATERAL Bilateral 08/10/2011 - ~ 2014   "right - left"  . CHOLECYSTECTOMY N/A 06/10/2016   Procedure: LAPAROSCOPIC CHOLECYSTECTOMY WITH INTRAOPERATIVE CHOLANGIOGRAM;  Surgeon: Erroll Luna, MD;  Location: [place];  Service: General;  Laterality: N/A;  . COLONOSCOPY W/ BIOPSIES AND POLYPECTOMY  2013  . HERNIA REPAIR    . VENTRAL HERNIA  REPAIR  08/22/2011   Procedure: HERNIA REPAIR VENTRAL ADULT;  Surgeon: Adin Hector, MD;  Location: [facility];  Service: General;  Laterality: N/A;  incarcerated   Family History:  Family History  Problem Relation Age of Onset  . Hyperlipidemia Mother   . Diabetes Mother   . Heart disease Father   . Hyperlipidemia Father   . Colon cancer Neg Hx    Family Psychiatric History: Depression Tobacco Screening:   Social History:  Social History   Substance and Sexual Activity  Alcohol Use No     Social History   Substance and Sexual Activity  Drug Use No    Additional Social History:                           Allergies:   Allergies  Allergen Reactions  . Methylprednisolone Acetate     Respiratory failure   Lab Results:  Results for orders placed or performed during the hospital encounter of 10/03/18 (from the  past 48 hour(s))  Comprehensive metabolic panel     Status: Abnormal   Collection Time: 10/03/18 10:21 PM  Result Value Ref Range   Sodium 140 135 - 145 mmol/L   Potassium 4.0 3.5 - 5.1 mmol/L   Chloride 106 98 - 111 mmol/L   CO2 22 22 - 32 mmol/L   Glucose, Bld 135 (H) 70 - 99 mg/dL   BUN 13 8 - 23 mg/dL   Creatinine, Ser 1.05 0.61 - 1.24 mg/dL   Calcium 9.4 8.9 - 10.3 mg/dL   Total Protein 7.5 6.5 - 8.1 g/dL   Albumin 4.1 3.5 - 5.0 g/dL   AST 22 15 - 41 U/L   ALT 24 0 - 44 U/L   Alkaline Phosphatase 65 38 - 126 U/L   Total Bilirubin 1.0 0.3 - 1.2 mg/dL   GFR calc non Af Amer >60 >60 mL/min   GFR calc Af Amer >60 >60 mL/min   Anion gap 12 5 - 15    Comment: Performed at [facility], [address], [place], [place] [address]  cbc     Status: Abnormal   Collection Time: 10/03/18 10:21 PM  Result Value Ref Range   WBC 12.4 (H) 4.0 - 10.5 K/uL   RBC 5.43 4.22 - 5.81 MIL/uL   Hemoglobin 14.7 13.0 - 17.0 g/dL   HCT 44.3 39.0 - 52.0 %   MCV 81.6 80.0 - 100.0 fL   MCH 27.1 26.0 - 34.0 pg   MCHC 33.2 30.0 - 36.0 g/dL   RDW 13.4 11.5 - 15.5 %    Platelets 229 150 - 400 K/uL   nRBC 0.0 0.0 - 0.2 %    Comment: Performed at [facility], [place] [address], [place], [place] [address]  Troponin I - ONCE - STAT     Status: None   Collection Time: 10/03/18 10:21 PM  Result Value Ref Range   Troponin I <0.03 <0.03 ng/mL    Comment: Performed at [facility], [place] [address] [place], [place] [address]  Ethanol     Status: None   Collection Time: 10/03/18 10:22 PM  Result Value Ref Range   Alcohol, Ethyl (B) <10 <10 mg/dL    Comment: (NOTE) Lowest detectable limit for serum alcohol is 10 mg/dL. For medical purposes only. Performed at [facility], [place] [address], [place], [place] [address]   Salicylate level     Status: None   Collection Time: 10/03/18 10:22 PM  Result Value Ref Range   Salicylate Lvl <7.5 2.8 - 30.0 mg/dL    Comment: Performed at [place] [address], [place], [place] [address]  Acetaminophen level     Status: Abnormal   Collection Time: 10/03/18 10:22 PM  Result Value Ref Range   Acetaminophen (Tylenol), Serum <10 (L) 10 - 30 ug/mL    Comment: (NOTE) Therapeutic concentrations vary significantly. A range of 10-30 ug/mL  may be an effective concentration for many patients. However, some  are best treated at concentrations outside of this range. Acetaminophen concentrations >150 ug/mL at 4 hours after ingestion  and >50 ug/mL at 12 hours after ingestion are often associated with  toxic reactions. Performed at [facility], [place] [address] [place], [place] [address]   Rapid urine drug screen (hospital performed)     Status: Abnormal   Collection Time: 10/03/18 10:23 PM  Result Value Ref Range   Opiates POSITIVE (A) NONE DETECTED  Cocaine NONE DETECTED NONE DETECTED   Benzodiazepines NONE DETECTED NONE DETECTED   Amphetamines NONE DETECTED NONE DETECTED   Tetrahydrocannabinol NONE DETECTED NONE DETECTED   Barbiturates NONE DETECTED NONE DETECTED    Comment:  (NOTE) DRUG SCREEN FOR MEDICAL PURPOSES ONLY.  IF CONFIRMATION IS NEEDED FOR ANY PURPOSE, NOTIFY LAB WITHIN 5 DAYS. LOWEST DETECTABLE LIMITS FOR URINE DRUG SCREEN Drug Class                     Cutoff (ng/mL) Amphetamine and metabolites    1000 Barbiturate and metabolites    200 Benzodiazepine                 378 Tricyclics and metabolites     300 Opiates and metabolites        300 Cocaine and metabolites        300 THC                            50 Performed at [facility], [place] [address], [place], [place] [address]   Urinalysis, Routine w reflex microscopic     Status: None   Collection Time: 10/03/18 10:23 PM  Result Value Ref Range   Color, Urine YELLOW YELLOW   APPearance CLEAR CLEAR   Specific Gravity, Urine 1.023 1.005 - 1.030   pH 5.0 5.0 - 8.0   Glucose, UA NEGATIVE NEGATIVE mg/dL   Hgb urine dipstick NEGATIVE NEGATIVE   Bilirubin Urine NEGATIVE NEGATIVE   Ketones, ur NEGATIVE NEGATIVE mg/dL   Protein, ur NEGATIVE NEGATIVE mg/dL   Nitrite NEGATIVE NEGATIVE   Leukocytes,Ua NEGATIVE NEGATIVE    Comment: Performed at [place] [address] [place],  [address]    Blood Alcohol level:  Lab Results  Component Value Date   ETH <10 28/78/6767    Metabolic Disorder Labs:  Lab Results  Component Value Date   HGBA1C 8.8 (A) 02/06/2018   MPG 232 06/10/2016   MPG 200 (H) 08/23/2011   No results found for: PROLACTIN Lab Results  Component Value Date   CHOL 112 12/21/2017   TRIG 271 (H) 12/21/2017   HDL 31 (L) 12/21/2017   CHOLHDL 3.6 12/21/2017   VLDL 48 (H) 06/09/2016   LDLCALC 27 12/21/2017   LDLCALC 15 06/09/2016    Current Medications: No current facility-administered medications for this encounter.    Current Outpatient Medications  Medication Sig Dispense Refill  . albuterol (PROVENTIL HFA;VENTOLIN HFA) 108 (90 BASE) MCG/ACT inhaler Inhale 2 puffs into the lungs every 6 (six) hours as needed for wheezing. 2 Inhaler 4  .  amLODipine (NORVASC) 10 MG tablet Take 1 tablet (10 mg total) by mouth daily before breakfast. 30 tablet 11  . aspirin 81 MG EC tablet TAKE 1 TABLET (81 MG TOTAL) BY MOUTH DAILY. 30 tablet 0  . baclofen (LIORESAL) 10 MG tablet TAKE 1 TABLET BY MOUTH THREE TIMES A DAY 30 tablet 0  . Blood Glucose Monitoring Suppl (ACCU-CHEK AVIVA PLUS) W/DEVICE KIT 1 application by Does not apply route daily. 1 kit 0  . glucose blood test strip Use as instructed 100 each 12  . HYDROcodone-acetaminophen (NORCO/VICODIN) 5-325 MG tablet Take 1-2 tablets by mouth every 4 (four) hours as needed. 36 tablet 0  . lisinopril (PRINIVIL,ZESTRIL) 10 MG tablet Take 1 tablet (10 mg total) by mouth daily. 90 tablet 3  . metFORMIN (GLUCOPHAGE) 1000 MG tablet TAKE 1 TABLET  BY MOUTH TWICE A DAY WITH MEALS 60 tablet 0  . polyethylene glycol powder (GLYCOLAX/MIRALAX) powder Take 17 g by mouth 2 (two) times daily as needed. 225 g 1  . pregabalin (LYRICA) 75 MG capsule Take 1 capsule (75 mg total) by mouth 2 (two) times daily. 60 capsule 2  . rosuvastatin (CRESTOR) 20 MG tablet Take 1 tablet (20 mg total) by mouth daily. 90 tablet 3  . sertraline (ZOLOFT) 50 MG tablet Take 1 tablet (50 mg total) by mouth daily. 90 tablet 3  . sitaGLIPtin (JANUVIA) 100 MG tablet Take 1 tablet (100 mg total) by mouth daily. 30 tablet 5  . tamsulosin (FLOMAX) 0.4 MG CAPS capsule Take 1 capsule (0.4 mg total) by mouth daily. 30 capsule 3   PTA Medications: No medications prior to admission.    Musculoskeletal: Strength & Muscle Tone: within normal limits Gait & Station: normal Patient leans: Front  Psychiatric Specialty Exam: Physical Exam  Constitutional: [name] oriented to person, place, and time. He appears well-developed and well-nourished. No distress.  HENT:  Head: Normocephalic and atraumatic.  Right Ear: External ear normal.  Left Ear: External ear normal.  Eyes: Pupils are equal, round, and reactive to light. Conjunctivae are normal.   Respiratory: Effort normal. No respiratory distress.  Neurological: [name] alert and oriented to person, place, and time.  Skin: [name] not diaphoretic.  Psychiatric: His mood appears anxious. His speech is not delayed. [name] not withdrawn and not actively hallucinating. Thought content is not paranoid and not delusional. Cognition and memory are normal. He exhibits a depressed mood. He expresses no homicidal and no suicidal ideation.    Review of Systems  Constitutional: Negative for chills, diaphoresis, fever, malaise/fatigue and weight loss.  Respiratory: Negative for cough and shortness of breath.   Cardiovascular: Negative for chest pain.  Gastrointestinal: Negative for diarrhea, nausea and vomiting.  Psychiatric/Behavioral: Positive for depression and suicidal ideas. Negative for hallucinations, memory loss and substance abuse. The patient is nervous/anxious. The patient does not have insomnia.     Blood pressure (!) 154/85, pulse 78, temperature 97.8 F (36.6 C), temperature source Oral, resp. rate 18, SpO2 98 %.There is no height or weight on file to calculate BMI.  General Appearance: Casual and Well Groomed  Eye Contact:  Good  Speech:  Clear and Coherent and Normal Rate  Volume:  Normal  Mood:  Anxious, Depressed, Hopeless and Worthless  Affect:  Congruent, Depressed and Tearful  Thought Process:  Coherent, Goal Directed and Descriptions of Associations: Intact  Orientation:  Full (Time, Place, and Person)  Thought Content:  Logical and Hallucinations: None  Suicidal Thoughts:  No  Homicidal Thoughts:  No  Memory:  Immediate;   Good Recent;   Fair  Judgement:  Intact  Insight:  Lacking  Psychomotor Activity:  Normal  Concentration:  Concentration: Fair and Attention Span: Fair  Recall:  [employer] of Knowledge:  Good  Language:  Good  Akathisia:  Negative  Handed:  Right  AIMS (if indicated):     Assets:  Communication Skills Desire for Improvement Housing Leisure  Time Physical Health  ADL's:  Intact  Cognition:  WNL  Sleep:         Treatment Plan Summary: Daily contact with patient to assess and evaluate symptoms and progress in treatment and Medication management  Observation Level/Precautions:  15 minute checks Laboratory:  See ED labs Psychotherapy:  Individual Medications:  Resumed home medications Consultations:  As needed Discharge  Concerns:   Estimated LOS: Other:      Rozetta Nunnery, NP 6/4/20205:36 AM
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
## 2018-10-04 NOTE — BH Assessment (Signed)
[facility] Assessment Progress Note  Per Buford Dresser, DO, this pt does not require psychiatric hospitalization at this time.  Pt is to be discharged from [facility] with recommendation to follw up with [facility].  This has been included in pt's discharge instructions.  Pt may also benefit from a social work consult, which Dr Mariea Clonts will order.  Pt's nurse, Freda Munro, has been notified.  Jalene Mullet, [place] Triage Specialist [phone]

## 2018-10-04 NOTE — Discharge Instructions (Signed)
For your behavioral health needs, you are advised to follow up with [facility].  Call them at your earliest opportunity to schedule an intake appointment:       [facility] [place]      [place], [place] [address]      [phone]
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
## 2018-10-04 NOTE — Plan of Care (Signed)
[facility] Observation Crisis Plan  Reason for Crisis Plan:  Crisis Stabilization   Plan of Care:  Referral for Telepsychiatry/Psychiatric Consult  Family Support:      Current Living Environment:     Insurance:   Hospital Account    Name Acct ID Class Status Primary Coverage   Jacob [name], Jacob [name] [id number] [place]        Guarantor Account (for Hospital Account [id number])    Name Relation to Pt Service Area Active? Acct Type   Jacob [name] Self [facility] Yes Behavioral Health   Address Phone       [address] [place], [place] [address] [phone])          Coverage Information (for Hospital Account [id number])    F/O Payor/Plan Precert #   [facility] MEDICAID/[place]ICA[place]    Subscriber Subscriber #   Jacob [name], Jacob [name] [id number]   Address Phone   [address] [place], [place] [address] [phone]      Legal Guardian:     Primary Care Provider:  Nuala Alpha, DO  Current Outpatient Providers:  none  Psychiatrist:     Counselor/Therapist:     Compliant with Medications:  No  Additional Information:   Jacob [name] 6/4/20206:44 AM
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
## 2018-10-04 NOTE — ED Notes (Signed)
[facility] arrived in ED to transport patient to [facility]

## 2018-10-04 NOTE — Progress Notes (Signed)
Jacob [name] is a 63 year old male being admitted voluntarily to [facility] Obs unit room 206.  He came to [facility] as a walk in for suicidal ideation.  He reported current stressors as his father is currently in hospice and he is living in his father's basement.  Step mother told him that after his father passes, he will have to move out and he has been living there for the past 27 years.  He hasn't been taking his medications for a long time and has history of depression.  During [facility] Obs admission, he was pleasant and cooperative.  He denied SI/HI or A/V hallucinations.  He did report anxiety in his chest, tightness, abdominal distention and strong smelling urine.  Encouraged him to speak with MD today about this.  Oriented him to the unit.  BH-OBS paperwork completed and signed.  Belongings secured in tamper resistant bag and placed in locker # 26.  No contraband found.  Skin assessment completed and no skin issues noted.  Q 15 minute checks initiated for safety.

## 2018-10-04 NOTE — ED Provider Notes (Signed)
[facility] EMERGENCY DEPARTMENT Provider Note   CSN: [id number] Arrival date & time: 10/03/18  2156    History   Chief Complaint Chief Complaint  Patient presents with   Medical Clearance   Chest Pain    HPI Jacob [name] is a 63 y.o. male.     Patient to ED after being evaluated for depression and stress at [facility] for medical clearance with an already established plan to return to [facility] to obs unit. No SI/HI. He complains of chest tightness and abdominal pain. He also endorses difficulty maintaining a urinary stream until his bladder empties. His chest tightness is constant and does not radiate. No diaphoresis. No SOB. No modifying factors. He denies history of heart disease.   His abdominal pain is generalized and constant but increases and decreases in intensity. He notices he belches quite a bit. No constipation or diarrhea. He feels bloated. He denies any history of reflux.   Symptoms of chest tightness and abdominal pain have been ongoing for "a while".     The history is provided by the patient. No language interpreter was used.    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 meds   Increased urinary frequency    Kidney stones 08-10-11   once in past, none recent   Methylprednisolone-induced hypoxia 08/23/2011   Neuromuscular disorder ([place]) 08-10-11   "burning/tingling sensation In feet"   Obesity    Obesity, Class II, BMI 35-39.9, with comorbidity 06/29/2006   OSA (obstructive sleep apnea)    "couldn't deal w/CPAP" (06/08/2016)   Preoperative clearance    RUQ pain 06/09/2016   Shortness of breath 08-10-11   with increased activity only   Syncope and collapse 06/09/2016   Syncope and collapse 06/09/2016   Type II diabetes mellitus ([place])    Umbilical hernia     Patient Active Problem List   Diagnosis Date Noted   Depression, recurrent ([place]) 09/17/2018   Urinary frequency 02/09/2018   Syncope and collapse 06/09/2016   Hepatic steatosis 82/99/3716   Biliary colic    Uncontrolled type 2 diabetes mellitus with diabetic neuropathy (HCC)    ACS (acute coronary syndrome) ([place]) 06/08/2016   Erectile dysfunction 08/23/2013   Ventral hernia with obstruction 09/09/2011   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   OSA (obstructive sleep apnea) 08/23/2011   Colonic polyp 08/04/2011   Cataract 12/01/2010   Chest pain 07/15/2010   Type II diabetes mellitus with neurological manifestations, uncontrolled ([place]) 10/03/2008   HYPERLIPIDEMIA 02/19/2007   Gout, unspecified 06/29/2006   Obesity, Class II, BMI 35-39.9, with comorbidity 06/29/2006   DEPRESSION, MAJOR, RECURRENT  06/29/2006   Anxiety state 06/29/2006   Essential hypertension, benign 06/29/2006    Past Surgical History:  Procedure Laterality Date   CATARACT EXTRACTION W/ INTRAOCULAR LENS  IMPLANT, BILATERAL Bilateral 08/10/2011 - ~ 2014   "right - left"   CHOLECYSTECTOMY N/A 06/10/2016   Procedure: LAPAROSCOPIC CHOLECYSTECTOMY WITH INTRAOPERATIVE CHOLANGIOGRAM;  Surgeon: Erroll Luna, MD;  Location: [facility];  Service:  General;  Laterality: N/A;   COLONOSCOPY W/ BIOPSIES AND POLYPECTOMY  2013   HERNIA REPAIR     VENTRAL HERNIA REPAIR  08/22/2011   Procedure: HERNIA REPAIR VENTRAL ADULT;  Surgeon: Adin Hector, MD;  Location: [facility];  Service: General;  Laterality: N/A;  incarcerated        Home Medications    Prior to Admission medications   Medication Sig Start Date End Date Taking? Authorizing Provider  albuterol (PROVENTIL HFA;VENTOLIN HFA) 108 (90 BASE) MCG/ACT inhaler Inhale 2 puffs into the lungs every 6 (six) hours as needed for wheezing. 06/04/14   Melancon, York Ram, MD  amLODipine (NORVASC) 10 MG tablet Take 1 tablet (10 mg total) by mouth daily before breakfast. 12/21/17   Nuala Alpha, DO  aspirin 81 MG EC tablet TAKE 1 TABLET (81 MG TOTAL) BY MOUTH DAILY. 07/08/16   Ronnie Doss M, DO  baclofen (LIORESAL) 10 MG tablet TAKE 1 TABLET BY MOUTH THREE TIMES A DAY 06/18/18   Lockamy, Timothy, DO  Blood Glucose Monitoring Suppl (ACCU-CHEK AVIVA PLUS) W/DEVICE KIT 1 application by Does not apply route daily. 04/08/15   Melancon, York Ram, MD  glucose blood test strip Use as instructed 05/15/13   Waldemar Dickens, MD  HYDROcodone-acetaminophen (NORCO/VICODIN) 5-325 MG tablet Take 1-2 tablets by mouth every 4 (four) hours as needed. 06/28/16   Janora Norlander, DO  lisinopril (PRINIVIL,ZESTRIL) 10 MG tablet Take 1 tablet (10 mg total) by mouth daily. 06/03/15   Melancon, York Ram, MD  metFORMIN (GLUCOPHAGE) 1000 MG tablet TAKE 1 TABLET BY MOUTH TWICE A DAY WITH MEALS 07/16/18   Lockamy, Timothy, DO  polyethylene glycol powder (GLYCOLAX/MIRALAX) powder Take 17 g by mouth 2 (two) times daily as needed. 06/14/16   Janora Norlander, DO  pregabalin (LYRICA) 75 MG capsule Take 1 capsule (75 mg total) by mouth 2 (two) times daily. 12/21/17   Nuala Alpha, DO  rosuvastatin (CRESTOR) 20 MG tablet Take 1 tablet (20 mg total) by mouth daily. 12/21/17   Nuala Alpha, DO  sertraline (ZOLOFT) 50 MG tablet  Take 1 tablet (50 mg total) by mouth daily. 09/27/18   Shirley, [place], DO  sitaGLIPtin (JANUVIA) 100 MG tablet Take 1 tablet (100 mg total) by mouth daily. 02/06/18   Nuala Alpha, DO  tamsulosin (FLOMAX) 0.4 MG CAPS capsule Take 1 capsule (0.4 mg total) by mouth daily. 02/06/18   Nuala Alpha, DO    Family History Family History  Problem Relation Age of Onset   Hyperlipidemia Mother    Diabetes Mother    Heart disease Father    Hyperlipidemia Father    Colon cancer Neg Hx     Social History Social History   Tobacco Use   Smoking status: Never Smoker   Smokeless tobacco: Never Used  Substance Use Topics   Alcohol use: No   Drug use: No     Allergies   Methylprednisolone acetate   Review of Systems Review of Systems  Constitutional: Negative for chills and fever.  HENT: Negative.   Respiratory: Positive for chest tightness. Negative for cough and  shortness of breath.   Cardiovascular: Negative.   Gastrointestinal: Positive for abdominal distention and abdominal pain. Negative for constipation, diarrhea, nausea and vomiting.  Genitourinary: Positive for difficulty urinating. Negative for dysuria and hematuria.       See HPI.  Musculoskeletal: Negative.  Negative for myalgias.  Skin: Negative.   Neurological: Negative.   Psychiatric/Behavioral: Positive for dysphoric mood. The patient is nervous/anxious.      Physical Exam Updated Vital Signs BP 132/72 (BP Location: Right Arm)    Pulse 81    Temp 98.1 F (36.7 C) (Oral)    Resp 19    SpO2 100%   Physical Exam Vitals signs and nursing note reviewed.  Constitutional:      General: [name] not in acute distress.    Appearance: [name] well-developed. [name] obese. [name] not ill-appearing.  HENT:     Head: Normocephalic.  Neck:     Musculoskeletal: Normal range of motion and neck supple.  Cardiovascular:     Rate and Rhythm: Normal rate and regular rhythm.     Heart sounds: No murmur.  Pulmonary:      Effort: Pulmonary effort is normal.     Breath sounds: Normal breath sounds. No wheezing, rhonchi or rales.  Abdominal:     General: Bowel sounds are normal.     Palpations: Abdomen is soft.     Tenderness: There is abdominal tenderness (Diffusely tender. ). There is no guarding or rebound.  Musculoskeletal: Normal range of motion.  Skin:    General: Skin is warm and dry.     Findings: No rash.  Neurological:     Mental Status: [name] alert.  Psychiatric:        Attention and Perception: He does not perceive auditory or visual hallucinations.        Mood and Affect: Mood is depressed.        Speech: Speech normal.        Thought Content: Thought content does not include homicidal or suicidal ideation.      ED Treatments / Results  Labs (all labs ordered are listed, but only abnormal results are displayed) Labs Reviewed  COMPREHENSIVE METABOLIC PANEL - Abnormal; Notable for the following components:      Result Value   Glucose, Bld 135 (*)    All other components within normal limits  ACETAMINOPHEN LEVEL - Abnormal; Notable for the following components:   Acetaminophen (Tylenol), Serum <10 (*)    All other components within normal limits  CBC - Abnormal; Notable for the following components:   WBC 12.4 (*)    All other components within normal limits  RAPID URINE DRUG SCREEN, HOSP PERFORMED - Abnormal; Notable for the following components:   Opiates POSITIVE (*)    All other components within normal limits  SARS CORONAVIRUS 2 (HOSPITAL ORDER, Okay LAB)  ETHANOL  SALICYLATE LEVEL  TROPONIN I  URINALYSIS, ROUTINE W REFLEX MICROSCOPIC   Results for orders placed or performed during the hospital encounter of 10/03/18  Comprehensive metabolic panel  Result Value Ref Range   Sodium 140 135 - 145 mmol/L   Potassium 4.0 3.5 - 5.1 mmol/L   Chloride 106 98 - 111 mmol/L   CO2 22 22 - 32 mmol/L   Glucose, Bld 135 (H) 70 - 99 mg/dL   BUN 13 8 - 23  mg/dL   Creatinine, Ser 1.05 0.61 - 1.24 mg/dL   Calcium 9.4 8.9 - 10.3 mg/dL  Total Protein 7.5 6.5 - 8.1 g/dL   Albumin 4.1 3.5 - 5.0 g/dL   AST 22 15 - 41 U/L   ALT 24 0 - 44 U/L   Alkaline Phosphatase 65 38 - 126 U/L   Total Bilirubin 1.0 0.3 - 1.2 mg/dL   GFR calc non Af Amer >60 >60 mL/min   GFR calc Af Amer >60 >60 mL/min   Anion gap 12 5 - 15  Ethanol  Result Value Ref Range   Alcohol, Ethyl (B) <01 <75 mg/dL  Salicylate level  Result Value Ref Range   Salicylate Lvl <1.0 2.8 - 30.0 mg/dL  Acetaminophen level  Result Value Ref Range   Acetaminophen (Tylenol), Serum <10 (L) 10 - 30 ug/mL  cbc  Result Value Ref Range   WBC 12.4 (H) 4.0 - 10.5 K/uL   RBC 5.43 4.22 - 5.81 MIL/uL   Hemoglobin 14.7 13.0 - 17.0 g/dL   HCT 44.3 39.0 - 52.0 %   MCV 81.6 80.0 - 100.0 fL   MCH 27.1 26.0 - 34.0 pg   MCHC 33.2 30.0 - 36.0 g/dL   RDW 13.4 11.5 - 15.5 %   Platelets 229 150 - 400 K/uL   nRBC 0.0 0.0 - 0.2 %  Rapid urine drug screen (hospital performed)  Result Value Ref Range   Opiates POSITIVE (A) NONE DETECTED   Cocaine NONE DETECTED NONE DETECTED   Benzodiazepines NONE DETECTED NONE DETECTED   Amphetamines NONE DETECTED NONE DETECTED   Tetrahydrocannabinol NONE DETECTED NONE DETECTED   Barbiturates NONE DETECTED NONE DETECTED  Troponin I - ONCE - STAT  Result Value Ref Range   Troponin I <0.03 <0.03 ng/mL  Urinalysis, Routine w reflex microscopic  Result Value Ref Range   Color, Urine YELLOW YELLOW   APPearance CLEAR CLEAR   Specific Gravity, Urine 1.023 1.005 - 1.030   pH 5.0 5.0 - 8.0   Glucose, UA NEGATIVE NEGATIVE mg/dL   Hgb urine dipstick NEGATIVE NEGATIVE   Bilirubin Urine NEGATIVE NEGATIVE   Ketones, ur NEGATIVE NEGATIVE mg/dL   Protein, ur NEGATIVE NEGATIVE mg/dL   Nitrite NEGATIVE NEGATIVE   Leukocytes,Ua NEGATIVE NEGATIVE    EKG None EKG: normal sinus rhythm.  Radiology Dg Chest 2 View  Result Date: 10/03/2018 CLINICAL DATA:  Chest pain EXAM:  CHEST - 2 VIEW COMPARISON:  09/27/2018 FINDINGS: There is persistent elevation of the left hemidiaphragm. The cardiac size is stable from prior study. There is no pneumothorax. There is stable scarring versus atelectasis at the left lung base. There is no acute osseous abnormality. IMPRESSION: No active cardiopulmonary disease. Electronically Signed   By: Constance Holster M.D.   On: 10/03/2018 22:53    Procedures Procedures (including critical care time)  Medications Ordered in ED Medications - No data to display   Initial Impression / Assessment and Plan / ED Course  I have reviewed the triage vital signs and the nursing notes.  Pertinent labs & imaging results that were available during my care of the patient were reviewed by me and considered in my medical decision making (see chart for details).        Patient to ED after being evaluated for depression and stress at [facility] for medical clearance, plan to return to [facility] to obs unit. No SI/HI. He complains of chest tightness and abdominal pain. He also endorse difficulty maintaining a urinary stream until his bladder empties. No fever, SOB, cough, vomiting. He denies history of heart disease. Symptoms of chest tightness  and abdominal pain have been ongoing for "a while".   The patient is well appearing. His EKG is non-ischemic. He has a negative troponin and a clear CXR. No other lab abnormalities to suggest acute process. VSS.   COVID test collected for planned admission to [facility] and is pending.   [name] considered medically cleared for return to [facility].  Final Clinical Impressions(s) / ED Diagnoses   Final diagnoses:  None   1. Nonspecific chest pain 2. Abdominal pain 3. Depression  ED Discharge Orders    None       [name], Hershal Coria 10/04/18 3662    Mesner, Corene Cornea, MD 10/04/18 [phone]

## 2018-10-04 NOTE — Progress Notes (Addendum)
D: Patient verbalizes apprehension about discharge asking, "Is there some sort of opioid that you could give me to feel better before going home?" Denies suicidal and homicidal ideations. Denies auditory and visual hallucinations.  No complaints of pain. Sister here to pick up and escort home. Both receptive to discharge instructions.

## 2018-10-12 ENCOUNTER — Other Ambulatory Visit: Payer: Self-pay

## 2018-10-12 ENCOUNTER — Encounter (HOSPITAL_BASED_OUTPATIENT_CLINIC_OR_DEPARTMENT_OTHER): Payer: Self-pay | Admitting: *Deleted

## 2018-10-15 ENCOUNTER — Other Ambulatory Visit (HOSPITAL_COMMUNITY)
Admission: RE | Admit: 2018-10-15 | Discharge: 2018-10-15 | Disposition: A | Discharge status: Home / Self Care | Payer: Medicaid Other | Source: Ambulatory Visit | Attending: Surgery | Admitting: Surgery

## 2018-10-15 DIAGNOSIS — Z1159 Encounter for screening for other viral diseases: Secondary | ICD-10-CM | POA: Diagnosis not present

## 2018-10-17 LAB — NOVEL CORONAVIRUS, NAA (HOSP ORDER, SEND-OUT TO REF LAB; TAT 18-24 HRS): SARS-CoV-2, NAA: NOT DETECTED

## 2018-10-18 ENCOUNTER — Ambulatory Visit (HOSPITAL_BASED_OUTPATIENT_CLINIC_OR_DEPARTMENT_OTHER)
Admission: RE | Admit: 2018-10-18 | Discharge: 2018-10-18 | Disposition: A | Discharge status: Home / Self Care | Payer: Medicaid Other | Attending: Surgery | Admitting: Surgery

## 2018-10-18 ENCOUNTER — Encounter (HOSPITAL_BASED_OUTPATIENT_CLINIC_OR_DEPARTMENT_OTHER)
Admission: RE | Disposition: A | Discharge status: Home / Self Care | Payer: Self-pay | Source: Home / Self Care | Attending: Surgery

## 2018-10-18 ENCOUNTER — Ambulatory Visit (HOSPITAL_BASED_OUTPATIENT_CLINIC_OR_DEPARTMENT_OTHER): Payer: Medicaid Other | Admitting: Anesthesiology

## 2018-10-18 ENCOUNTER — Other Ambulatory Visit: Payer: Self-pay

## 2018-10-18 ENCOUNTER — Encounter (HOSPITAL_BASED_OUTPATIENT_CLINIC_OR_DEPARTMENT_OTHER): Payer: Self-pay | Admitting: Anesthesiology

## 2018-10-18 DIAGNOSIS — E119 Type 2 diabetes mellitus without complications: Secondary | ICD-10-CM | POA: Insufficient documentation

## 2018-10-18 DIAGNOSIS — Z79899 Other long term (current) drug therapy: Secondary | ICD-10-CM | POA: Diagnosis not present

## 2018-10-18 DIAGNOSIS — G4730 Sleep apnea, unspecified: Secondary | ICD-10-CM | POA: Insufficient documentation

## 2018-10-18 DIAGNOSIS — I10 Essential (primary) hypertension: Secondary | ICD-10-CM | POA: Diagnosis not present

## 2018-10-18 DIAGNOSIS — Z7984 Long term (current) use of oral hypoglycemic drugs: Secondary | ICD-10-CM | POA: Diagnosis not present

## 2018-10-18 DIAGNOSIS — D171 Benign lipomatous neoplasm of skin and subcutaneous tissue of trunk: Secondary | ICD-10-CM | POA: Insufficient documentation

## 2018-10-18 DIAGNOSIS — Z7982 Long term (current) use of aspirin: Secondary | ICD-10-CM | POA: Diagnosis not present

## 2018-10-18 HISTORY — DX: Other complications of anesthesia, initial encounter: T88.59XA

## 2018-10-18 HISTORY — PX: LIPOMA EXCISION: SHX5283

## 2018-10-18 HISTORY — DX: Anxiety disorder, unspecified: F41.9

## 2018-10-18 LAB — GLUCOSE, CAPILLARY
Glucose-Capillary: 97 mg/dL (ref 70–99)
Glucose-Capillary: 111 mg/dL — ABNORMAL HIGH (ref 70–99)

## 2018-10-18 SURGERY — EXCISION LIPOMA
Anesthesia: General | Site: Back | Laterality: Right

## 2018-10-18 SURGERY — EXCISION LIPOMA
Anesthesia: General | Laterality: Right

## 2018-10-18 MED ORDER — GLYCOPYRROLATE 0.2 MG/ML IJ SOLN
INTRAMUSCULAR | Status: DC | PRN
  Administered 2018-10-18: 0.2 mg via INTRAVENOUS

## 2018-10-18 MED ORDER — ACETAMINOPHEN 500 MG PO TABS
1000.0000 mg | ORAL_TABLET | Freq: Once | ORAL | Status: AC
  Administered 2018-10-18: 1000 mg via ORAL

## 2018-10-18 MED ORDER — PHENYLEPHRINE HCL (PRESSORS) 10 MG/ML IV SOLN
INTRAVENOUS | Status: DC | PRN
  Administered 2018-10-18 (×2): 80 ug via INTRAVENOUS

## 2018-10-18 MED ORDER — GABAPENTIN 300 MG PO CAPS
300.0000 mg | ORAL_CAPSULE | ORAL | Status: AC
  Administered 2018-10-18: 07:00:00 300 mg via ORAL

## 2018-10-18 MED ORDER — LIDOCAINE 2% (20 MG/ML) 5 ML SYRINGE
INTRAMUSCULAR | Status: AC
  Filled 2018-10-18: qty 5

## 2018-10-18 MED ORDER — FENTANYL CITRATE (PF) 100 MCG/2ML IJ SOLN
25.0000 ug | INTRAMUSCULAR | Status: DC | PRN
  Administered 2018-10-18: 09:00:00 50 ug via INTRAVENOUS

## 2018-10-18 MED ORDER — FENTANYL CITRATE (PF) 100 MCG/2ML IJ SOLN
INTRAMUSCULAR | Status: AC
  Filled 2018-10-18: qty 2

## 2018-10-18 MED ORDER — BUPIVACAINE-EPINEPHRINE (PF) 0.25% -1:200000 IJ SOLN
INTRAMUSCULAR | Status: AC
  Filled 2018-10-18: qty 30

## 2018-10-18 MED ORDER — CEFAZOLIN SODIUM-DEXTROSE 2-4 GM/100ML-% IV SOLN
INTRAVENOUS | Status: AC
  Filled 2018-10-18: qty 200

## 2018-10-18 MED ORDER — ROCURONIUM BROMIDE 100 MG/10ML IV SOLN
INTRAVENOUS | Status: DC | PRN
  Administered 2018-10-18: 70 mg via INTRAVENOUS

## 2018-10-18 MED ORDER — PROPOFOL 10 MG/ML IV BOLUS
INTRAVENOUS | Status: DC | PRN
  Administered 2018-10-18: 150 mg via INTRAVENOUS

## 2018-10-18 MED ORDER — CHLORHEXIDINE GLUCONATE CLOTH 2 % EX PADS: 6.0000 | MEDICATED_PAD | Freq: Once | CUTANEOUS | Status: DC

## 2018-10-18 MED ORDER — BUPIVACAINE-EPINEPHRINE (PF) 0.5% -1:200000 IJ SOLN
INTRAMUSCULAR | Status: AC
  Filled 2018-10-18: qty 30

## 2018-10-18 MED ORDER — SUGAMMADEX SODIUM 500 MG/5ML IV SOLN
INTRAVENOUS | Status: DC | PRN
  Administered 2018-10-18: 500 mg via INTRAVENOUS

## 2018-10-18 MED ORDER — IBUPROFEN 800 MG PO TABS: 800.0000 mg | ORAL_TABLET | Freq: Three times a day (TID) | ORAL | 0 refills | Status: DC | PRN

## 2018-10-18 MED ORDER — EPHEDRINE SULFATE 50 MG/ML IJ SOLN
INTRAMUSCULAR | Status: DC | PRN
  Administered 2018-10-18: 10 mg via INTRAVENOUS

## 2018-10-18 MED ORDER — PHENYLEPHRINE HCL (PRESSORS) 10 MG/ML IV SOLN
INTRAVENOUS | Status: AC
  Filled 2018-10-18: qty 1

## 2018-10-18 MED ORDER — ONDANSETRON HCL 4 MG/2ML IJ SOLN
INTRAMUSCULAR | Status: AC
  Filled 2018-10-18: qty 2

## 2018-10-18 MED ORDER — GABAPENTIN 300 MG PO CAPS: 300.0000 mg | ORAL_CAPSULE | ORAL | Status: DC

## 2018-10-18 MED ORDER — MIDAZOLAM HCL 2 MG/2ML IJ SOLN
INTRAMUSCULAR | Status: AC
  Filled 2018-10-18: qty 2

## 2018-10-18 MED ORDER — SCOPOLAMINE 1 MG/3DAYS TD PT72: 1.0000 | MEDICATED_PATCH | Freq: Once | TRANSDERMAL | Status: DC

## 2018-10-18 MED ORDER — GABAPENTIN 300 MG PO CAPS
ORAL_CAPSULE | ORAL | Status: AC
  Filled 2018-10-18: qty 1

## 2018-10-18 MED ORDER — ONDANSETRON HCL 4 MG/2ML IJ SOLN
INTRAMUSCULAR | Status: DC | PRN
  Administered 2018-10-18: 4 mg via INTRAVENOUS

## 2018-10-18 MED ORDER — PROPOFOL 500 MG/50ML IV EMUL
INTRAVENOUS | Status: AC
  Filled 2018-10-18: qty 50

## 2018-10-18 MED ORDER — DEXTROSE 5 % IV SOLN: 3.0000 g | INTRAVENOUS | Status: DC

## 2018-10-18 MED ORDER — BUPIVACAINE-EPINEPHRINE 0.25% -1:200000 IJ SOLN
INTRAMUSCULAR | Status: DC | PRN
  Administered 2018-10-18: 20 mL

## 2018-10-18 MED ORDER — DEXTROSE 5 % IV SOLN
3.0000 g | INTRAVENOUS | Status: AC
  Administered 2018-10-18: 08:00:00 2 g via INTRAVENOUS

## 2018-10-18 MED ORDER — EPHEDRINE 5 MG/ML INJ
INTRAVENOUS | Status: AC
  Filled 2018-10-18: qty 10

## 2018-10-18 MED ORDER — CELECOXIB 200 MG PO CAPS
200.0000 mg | ORAL_CAPSULE | ORAL | Status: AC
  Administered 2018-10-18: 07:00:00 200 mg via ORAL

## 2018-10-18 MED ORDER — HYDROCODONE-ACETAMINOPHEN 5-325 MG PO TABS: 1.0000 | ORAL_TABLET | Freq: Four times a day (QID) | ORAL | 0 refills | Status: DC | PRN

## 2018-10-18 MED ORDER — SUGAMMADEX SODIUM 500 MG/5ML IV SOLN
INTRAVENOUS | Status: AC
  Filled 2018-10-18: qty 5

## 2018-10-18 MED ORDER — CELECOXIB 200 MG PO CAPS
ORAL_CAPSULE | ORAL | Status: AC
  Filled 2018-10-18: qty 1

## 2018-10-18 MED ORDER — ACETAMINOPHEN 500 MG PO TABS: 1000.0000 mg | ORAL_TABLET | ORAL | Status: DC

## 2018-10-18 MED ORDER — ACETAMINOPHEN 500 MG PO TABS
ORAL_TABLET | ORAL | Status: AC
  Filled 2018-10-18: qty 2

## 2018-10-18 MED ORDER — MIDAZOLAM HCL 2 MG/2ML IJ SOLN
1.0000 mg | INTRAMUSCULAR | Status: DC | PRN
  Administered 2018-10-18: 2 mg via INTRAVENOUS

## 2018-10-18 MED ORDER — LACTATED RINGERS IV SOLN
INTRAVENOUS | Status: DC
  Administered 2018-10-18 (×2): via INTRAVENOUS

## 2018-10-18 MED ORDER — FENTANYL CITRATE (PF) 100 MCG/2ML IJ SOLN
50.0000 ug | INTRAMUSCULAR | Status: DC | PRN
  Administered 2018-10-18 (×2): 50 ug via INTRAVENOUS

## 2018-10-18 SURGICAL SUPPLY — 54 items
ADH SKN CLS APL DERMABOND .7 (GAUZE/BANDAGES/DRESSINGS) ×1
APL PRP STRL LF DISP 70% ISPRP (MISCELLANEOUS) ×1
APL SKNCLS STERI-STRIP NONHPOA (GAUZE/BANDAGES/DRESSINGS)
BENZOIN TINCTURE PRP APPL 2/3 (GAUZE/BANDAGES/DRESSINGS) IMPLANT
BIOPATCH RED 1 DISK 7.0 (GAUZE/BANDAGES/DRESSINGS) ×1 IMPLANT
BIOPATCH RED 1IN DISK 7.0MM (GAUZE/BANDAGES/DRESSINGS) ×1
BLADE SURG 10 STRL SS (BLADE) ×2 IMPLANT
BLADE SURG 15 STRL LF DISP TIS (BLADE) ×1 IMPLANT
BLADE SURG 15 STRL SS (BLADE) ×3
CANISTER SUCT 1200ML W/VALVE (MISCELLANEOUS) ×2 IMPLANT
CHLORAPREP W/TINT 26 (MISCELLANEOUS) ×3 IMPLANT
CLOSURE WOUND 1/2 X4 (GAUZE/BANDAGES/DRESSINGS)
COVER BACK TABLE REUSABLE LG (DRAPES) ×3 IMPLANT
COVER MAYO STAND REUSABLE (DRAPES) ×3 IMPLANT
COVER WAND RF STERILE (DRAPES) IMPLANT
DECANTER SPIKE VIAL GLASS SM (MISCELLANEOUS) IMPLANT
DERMABOND ADVANCED (GAUZE/BANDAGES/DRESSINGS) ×2
DERMABOND ADVANCED .7 DNX12 (GAUZE/BANDAGES/DRESSINGS) IMPLANT
DRAIN CHANNEL 19F RND (DRAIN) ×2 IMPLANT
DRAPE LAPAROSCOPIC ABDOMINAL (DRAPES) ×2 IMPLANT
DRAPE LAPAROTOMY 100X72 PEDS (DRAPES) ×3 IMPLANT
DRAPE UTILITY XL STRL (DRAPES) ×3 IMPLANT
ELECT COATED BLADE 2.86 ST (ELECTRODE) ×3 IMPLANT
ELECT REM PT RETURN 9FT ADLT (ELECTROSURGICAL) ×3
ELECTRODE REM PT RTRN 9FT ADLT (ELECTROSURGICAL) ×1 IMPLANT
EVACUATOR SILICONE 100CC (DRAIN) ×2 IMPLANT
GLOVE BIO SURGEON STRL SZ7 (GLOVE) ×2 IMPLANT
GLOVE BIOGEL PI IND STRL 7.0 (GLOVE) IMPLANT
GLOVE BIOGEL PI IND STRL 8 (GLOVE) ×1 IMPLANT
GLOVE BIOGEL PI INDICATOR 7.0 (GLOVE) ×2
GLOVE BIOGEL PI INDICATOR 8 (GLOVE) ×2
GLOVE ECLIPSE 8.0 STRL XLNG CF (GLOVE) ×3 IMPLANT
GLOVE EXAM NITRILE MD LF STRL (GLOVE) ×2 IMPLANT
GOWN STRL REUS W/ TWL LRG LVL3 (GOWN DISPOSABLE) ×2 IMPLANT
GOWN STRL REUS W/TWL LRG LVL3 (GOWN DISPOSABLE) ×6
NDL HYPO 25X1 1.5 SAFETY (NEEDLE) ×1 IMPLANT
NEEDLE HYPO 25X1 1.5 SAFETY (NEEDLE) ×3 IMPLANT
NS IRRIG 1000ML POUR BTL (IV SOLUTION) ×2 IMPLANT
PACK BASIN DAY SURGERY FS (CUSTOM PROCEDURE TRAY) ×3 IMPLANT
PENCIL BUTTON HOLSTER BLD 10FT (ELECTRODE) ×3 IMPLANT
SLEEVE SCD COMPRESS KNEE MED (MISCELLANEOUS) ×3 IMPLANT
SPONGE LAP 4X18 RFD (DISPOSABLE) ×2 IMPLANT
STAPLER VISISTAT 35W (STAPLE) IMPLANT
STRIP CLOSURE SKIN 1/2X4 (GAUZE/BANDAGES/DRESSINGS) IMPLANT
SUT ETHILON 2 0 FS 18 (SUTURE) ×2 IMPLANT
SUT MON AB 4-0 PC3 18 (SUTURE) ×3 IMPLANT
SUT VIC AB 2-0 SH 18 (SUTURE) ×4 IMPLANT
SUT VICRYL 3-0 CR8 SH (SUTURE) IMPLANT
SUT VICRYL AB 3 0 TIES (SUTURE) IMPLANT
SYR CONTROL 10ML LL (SYRINGE) ×3 IMPLANT
TOWEL GREEN STERILE FF (TOWEL DISPOSABLE) ×6 IMPLANT
TUBE CONNECTING 20'X1/4 (TUBING) ×1
TUBE CONNECTING 20X1/4 (TUBING) ×1 IMPLANT
YANKAUER SUCT BULB TIP NO VENT (SUCTIONS) ×2 IMPLANT

## 2018-10-18 NOTE — Transfer of Care (Signed)
Immediate Anesthesia Transfer of Care Note  Patient: Jacob [name]  Procedure(s) Performed: EXCISION RIGHT BACK  LIPOMA X 2 (Right Back)  Patient Location: PACU  Anesthesia Type:General  Level of Consciousness: sedated  Airway & Oxygen Therapy: Patient Spontanous Breathing and Patient connected to face mask oxygen  Post-op Assessment: Report given to RN and Post -op Vital signs reviewed and stable  Post vital signs: Reviewed and stable  Last Vitals:  Vitals Value Taken Time  BP 147/85 10/18/18 0908  Temp 36.7 C 10/18/18 0908  Pulse 92 10/18/18 0914  Resp 20 10/18/18 0914  SpO2 100 % 10/18/18 0914  Vitals shown include unvalidated device data.  Last Pain:  Vitals:   10/18/18 0908  TempSrc:   PainSc: Asleep         Complications: No apparent anesthesia complications

## 2018-10-18 NOTE — Interval H&P Note (Signed)
History and Physical Interval Note:  10/18/2018 7:25 AM  Jacob [name]  has presented today for surgery, with the diagnosis of lipoma times two upper right back.  The various methods of treatment have been discussed with the patient and family. After consideration of risks, benefits and other options for treatment, the patient has consented to  Procedure(s): EXCISION RIGHT BACK  LIPOMA X 2 (Right) as a surgical intervention.  The patient's history has been reviewed, patient examined, no change in status, stable for surgery.  I have reviewed the patient's chart and labs.  Questions were answered to the patient's satisfaction.     Advance

## 2018-10-18 NOTE — Op Note (Signed)
Preoperative diagnosis: Lipoma right lateral back measuring 15 x 20 cm subcutaneous and lipoma 3 x 5 cm central upper back both subcutaneous and multiloculated  Postoperative diagnosis: Same  Procedure: Excision of lipoma central upper back and lipoma right lateral back subcutaneous  Surgeon: Erroll Luna, MD  Anesthesia: General with 0.25% Sensorcaine local with epinephrine  Drains: 19 round to right lateral incision  EBL: 20 cc  Specimen lipoma as described above  IV fluids: Per anesthesia record  Indications for procedure: The patient presents for excision of 2 large lipoma on the right central upper back and the second in the midline in the upper back.  These are getting larger and causing pain.The procedure has been discussed with the patient.  Alternative therapies have been discussed with the patient.  Operative risks include bleeding,  Infection,  Organ injury,  Nerve injury,  Blood vessel injury,  DVT,  Pulmonary embolism,  Death,  And possible reoperation.  Medical management risks include worsening of present situation.  The success of the procedure is 50 -90 % at treating patients symptoms.  The patient understands and agrees to proceed.   The patient was seen in the holding area.  Both areas were marked with the assistance of the patient.  Questions were answered.  [name] taken to the operative room.  He was placed supine on the stretcher and intubated.  After achieving adequate general anesthesia, he was placed prone and padded by anesthesia.  His upper back was prepped and draped in sterile fashion.  Both areas were well marked.  Timeout was done.  Local anesthetic was infiltrated along the lipoma which was quite large in the right lateral back.  This was about 15 x 20 cm and subcutaneous.  Incision was made.  A large multiloculated lipoma was identified and excised from subcutaneous fat.  This was done until all lobulations were removed.  Hemostasis was achieved.  Through a  separate stab incision in 19 round drain was placed and secured to skin with 2-0 nylon.  Hemostasis was achieved and irrigation was used.  We then closed with 2-0 Vicryl and 4-0 Monocryl.  The lipoma in the upper central back measured 3 cm x 5 cm and was subcutaneous.  Local anesthetic was infiltrated around this.  Incision was made.  All tissue was excised with grossly negative margins.  This was also multiloculated.  We then closed the wound with 2-0 Vicryl and 4-0 Monocryl.  Dermabond applied to both.  All final counts were found to be correct.  The patient was then placed supine extubated on stretcher and taken to PACU in satisfactory condition.

## 2018-10-18 NOTE — Discharge Instructions (Signed)
No Tylenol or ibuprofen before 3:00pm    Surgical [facility] Surgical drains are used to remove extra fluid that normally builds up in a surgical wound after surgery. A surgical drain helps to heal a surgical wound. Different kinds of surgical drains include:  Active drains. These drains use suction to pull drainage away from the surgical wound. Drainage flows through a tube to a container outside of the body. It is important to keep the bulb or the drainage container flat (compressed) at all times, except while you empty it. Flattening the bulb or container creates suction. The two most common types of active drains are bulb drains and Hemovac drains.  Passive drains. These drains allow fluid to drain naturally, by gravity. Drainage flows through a tube to a bandage (dressing) or a container outside of the body. Passive drains do not need to be emptied. The most common type of passive drain is the Penrose drain. A drain is placed during surgery. Immediately after surgery, drainage is usually bright red and a little thicker than water. The drainage may gradually turn yellow or pink and become thinner. It is likely that your health care provider will remove the drain when the drainage stops or when the amount decreases to 1-2 Tbsp (15-30 mL) during a 24-hour period. How to care for your surgical drain It is important to care for your drain to prevent infection. If your drain is placed at your back, or any other hard-to-reach area, ask another person to assist you in performing the following steps:  Keep the skin around the drain dry and covered with a dressing at all times.  Check your drain area every day for signs of infection. Check for: ? More redness, swelling, or pain. ? Pus or a bad smell. ? Cloudy drainage. Follow instructions from your health care provider about how to take care of your drain and how to change your dressing. Change your dressing at least one time every day. Change it  more often if needed to keep the dressing dry. Make sure you: 1. Gather your supplies, including: ? Tape. ? Germ-free cleaning solution (sterile saline). ? Split gauze drain sponge: 4 x 4 inches (10 x 10 cm). ? Gauze square: 4 x 4 inches (10 x 10 cm). 2. Wash your hands with soap and water before you change your dressing. If soap and water are not available, use hand sanitizer. 3. Remove the old dressing. Avoid using scissors to do that. 4. Use sterile saline to clean your skin around the drain. 5. Place the tube through the slit in a drain sponge. Place the drain sponge so that it covers your wound. 6. Place the gauze square or another drain sponge on top of the drain sponge that is on the wound. Make sure the tube is between those layers. 7. Tape the dressing to your skin. 8. If you have an active bulb or Hemovac drain, tape the drainage tube to your skin 1-2 inches (2.5-5 cm) below the place where the tube enters your body. Taping keeps the tube from pulling on any stitches (sutures) that you have. 9. Wash your hands with soap and water. 10. Write down the color of your drainage and how often you change your dressing. How to empty your active bulb or Hemovac drain  1. Make sure that you have a measuring cup that you can empty your drainage into. 2. Wash your hands with soap and water. If soap and water are not available, use  hand sanitizer. 3. Gently move your fingers down the tube while squeezing very lightly. This is called stripping the tube. This clears any drainage, clots, or tissue from the tube. ? Do not pull on the tube. ? You may need to strip the tube several times every day to keep the tube clear. 4. Open the bulb cap or the drain plug. Do not touch the inside of the cap or the bottom of the plug. 5. Empty all of the drainage into the measuring cup. 6. Compress the bulb or the container and replace the cap or the plug. To compress the bulb or the container, squeeze it firmly in  the middle while you close the cap or plug the container. 7. Write down the amount of drainage that you have in each 24-hour period. If you have less than 2 Tbsp (30 mL) of drainage during 24 hours, contact your health care provider. 8. Flush the drainage down the toilet. 9. Wash your hands with soap and water. Contact a health care provider if:  You have more redness, swelling, or pain around your drain area.  The amount of drainage that you have is increasing instead of decreasing.  You have pus or a bad smell coming from your drain area.  You have a fever.  You have drainage that is cloudy.  There is a sudden stop or a sudden decrease in the amount of drainage that you have.  Your tube falls out.  Your active draindoes not stay compressedafter you empty it. Summary  Surgical drains are used to remove extra fluid that normally builds up in a surgical wound after surgery.  Different kinds of surgical drains include active drains and passive drains. Active drains use suction to pull drainage away from the surgical wound, and passive drains allow fluid to drain naturally.  It is important to care for your drain to prevent infection. If your drain is placed at your back, or any other hard-to-reach area, ask another person to assist you.  Contact your health care provider if you have more redness, swelling, or pain around your drain area. This information is not intended to replace advice given to you by your health care provider. Make sure you discuss any questions you have with your health care provider. Document Released: 04/15/2000 Document Revised: 05/11/2017 Document Reviewed: 11/05/2014 Elsevier Interactive Patient Education  2019 [place]: POST OP INSTRUCTIONS  ######################################################################  EAT Gradually transition to a high fiber diet with a fiber supplement over the next few weeks after discharge.   Start with a pureed / full liquid diet (see below)  WALK Walk an hour a day.  Control your pain to do that.    CONTROL PAIN Control pain so that you can walk, sleep, tolerate sneezing/coughing, go up/down stairs.  HAVE A BOWEL MOVEMENT DAILY Keep your bowels regular to avoid problems.  OK to try a laxative to override constipation.  OK to use an antidairrheal to slow down diarrhea.  Call if not better after 2 tries  CALL IF YOU HAVE PROBLEMS/CONCERNS Call if you are still struggling despite following these instructions. Call if you have concerns not answered by these instructions  ######################################################################    1. DIET: Follow a light bland diet the first 24 hours after arrival home, such as soup, liquids, crackers, etc.  Be sure to include lots of fluids daily.  Avoid fast food or heavy meals as your are more likely to get nauseated.  2. Take your usually prescribed home medications unless otherwise directed. 3. PAIN CONTROL: a. Pain is best controlled by a usual combination of three different methods TOGETHER: i. Ice/Heat ii. Over the counter pain medication iii. Prescription pain medication b. Most patients will experience some swelling and bruising around the incisions.  Ice packs or heating pads (30-60 minutes up to 6 times a day) will help. Use ice for the first few days to help decrease swelling and bruising, then switch to heat to help relax tight/sore spots and speed recovery.  Some people prefer to use ice alone, heat alone, alternating between ice & heat.  Experiment to what works for you.  Swelling and bruising can take several weeks to resolve.   c. It is helpful to take an over-the-counter pain medication regularly for the first few weeks.  Choose one of the following that works best for you: i. Naproxen (Aleve, etc)  Two 220mg  tabs twice a day ii. Ibuprofen (Advil, etc) Three 200mg  tabs four times a day (every meal &  bedtime) iii. Acetaminophen (Tylenol, etc) 500-650mg  four times a day (every meal & bedtime) d. A  prescription for pain medication (such as oxycodone, hydrocodone, etc) should be given to you upon discharge.  Take your pain medication as prescribed.  i. If you are having problems/concerns with the prescription medicine (does not control pain, nausea, vomiting, rash, itching, etc), please call us [phone] to see if we need to switch you to a different pain medicine that will work better for you and/or control your side effect better. ii. If you need a refill on your pain medication, please contact your pharmacy.  They will contact our office to request authorization. Prescriptions will not be filled after 5 pm or on week-ends. 4. Avoid getting constipated.  Between the surgery and the pain medications, it is common to experience some constipation.  Increasing fluid intake and taking a fiber supplement (such as Metamucil, Citrucel, FiberCon, MiraLax, etc) 1-2 times a day regularly will usually help prevent this problem from occurring.  A mild laxative (prune juice, Milk of Magnesia, MiraLax, etc) should be taken according to package directions if there are no bowel movements after 48 hours.   5. Wash / shower every day.  You may shower over the dressings as they are waterproof.  Continue to shower over incision(s) after the dressing is off. 6. Remove your waterproof bandages 5 days after surgery.  You may leave the incision open to air.  You may have skin tapes (Steri Strips) covering the incision(s).  Leave them on until one week, then remove.  You may replace a dressing/Band-Aid to cover the incision for comfort if you wish.      7. ACTIVITIES as tolerated:   a. You may resume regular (light) daily activities beginning the next day--such as daily self-care, walking, climbing stairs--gradually increasing activities as tolerated.  If you can walk 30 minutes without difficulty, it is safe to try  more intense activity such as jogging, treadmill, bicycling, low-impact aerobics, swimming, etc. b. Save the most intensive and strenuous activity for last such as sit-ups, heavy lifting, contact sports, etc  Refrain from any heavy lifting or straining until you are off narcotics for pain control.   c. DO NOT PUSH THROUGH PAIN.  Let pain be your guide: If it hurts to do something, don't do it.  Pain is your body warning you to avoid that activity for another week until the pain goes down. d. You may  drive when you are no longer taking prescription pain medication, you can comfortably wear a seatbelt, and you can safely maneuver your car and apply brakes. e. Dennis Bast may have sexual intercourse when it is comfortable.  8. FOLLOW UP in our office a. Please call [facility] at [phone] to set up an appointment to see your surgeon in the office for a follow-up appointment approximately 2-3 weeks after your surgery. b. Make sure that you call for this appointment the day you arrive home to insure a convenient appointment time. 9. IF YOU HAVE DISABILITY OR FAMILY LEAVE FORMS, BRING THEM TO THE OFFICE FOR PROCESSING.  DO NOT GIVE THEM TO YOUR DOCTOR.   WHEN TO CALL us [phone]: 1. Poor pain control 2. Reactions / problems with new medications (rash/itching, nausea, etc)  3. Fever over 101.5 F (38.5 C) 4. Worsening swelling or bruising 5. Continued bleeding from incision. 6. Increased pain, redness, or drainage from the incision 7. Difficulty breathing / swallowing   The clinic staff is available to answer your questions during regular business hours (8:30am-5pm).  Please dont hesitate to call and ask to speak to one of our nurses for clinical concerns.   If you have a medical emergency, go to the nearest emergency room or call 911.  A surgeon from [facility] is always on call at the [facility], [place], [place], [place], [place]  [id number]  ? MAIN: [phone] ? TOLL FREE: [phone] ?  FAX [phone] [email or web address]     Post Anesthesia Home Care Instructions  Activity: Get plenty of rest for the remainder of the day. A responsible individual must stay with you for 24 hours following the procedure.  For the next 24 hours, DO NOT: -Drive a car -Paediatric nurse -Drink alcoholic beverages -Take any medication unless instructed by your physician -Make any legal decisions or sign important papers.  Meals: Start with liquid foods such as gelatin or soup. Progress to regular foods as tolerated. Avoid greasy, spicy, heavy foods. If nausea and/or vomiting occur, drink only clear liquids until the nausea and/or vomiting subsides. Call your physician if vomiting continues.  Special Instructions/Symptoms: Your throat may feel dry or sore from the anesthesia or the breathing tube placed in your throat during surgery. If this causes discomfort, gargle with warm salt water. The discomfort should disappear within 24 hours.  If you had a scopolamine patch placed behind your ear for the management of post- operative nausea and/or vomiting:  1. The medication in the patch is effective for 72 hours, after which it should be removed.  Wrap patch in a tissue and discard in the trash. Wash hands thoroughly with soap and water. 2. You may remove the patch earlier than 72 hours if you experience unpleasant side effects which may include dry mouth, dizziness or visual disturbances. 3. Avoid touching the patch. Wash your hands with soap and water after contact with the patch.         JP Drain [employer] this sheet to all of your post-operative appointments while you have your drains.  Please measure your drains by CC's or ML's.  Make sure you drain and measure your JP Drains 2 or 3 times per day.  At the end of each day, add up totals for the left side and add up totals for the right side.    ( 9 am )      ( 3 pm )        (  9 pm )                Date L  R  L  R  L  R  Total L/R

## 2018-10-18 NOTE — H&P (Signed)
Jacob [name] Location: [facility] Patient #: [id number] DOB: May 20, 1955 Single / Language: Jacob [name] / Race: White Male  History of Present Illness  Patient words: Patient returns for follow-up of a large lipoma over his right scapula. Skin present for a couple of years. He desires excision due to this increasing size and causing discomfort and pain. His been no redness or drainage. The patient is over his right scapula. He also has a second smaller one just above it he states.  The patient is a 63 year old male.   Problem List/Past Medical  LIPOMA OF BACK (D17.1) STATUS POST LAPAROSCOPIC CHOLECYSTECTOMY (Z90.49)  Past Surgical History  Colon Polyp Removal - Colonoscopy Colon Polyp Removal - Open Gallbladder Surgery - Open  Diagnostic Studies History  Colonoscopy 1-5 years ago  Allergies  methylPREDNISolone Ace-Lido  Medication History  Rosuvastatin Calcium (20MG  Tablet, Oral) Active. Polyethylene Glycol 3350 (Oral) Active. MetFORMIN HCl (1000MG  Tablet, Oral) Active. Lisinopril (10MG  Tablet, Oral) Active. Ibuprofen (600MG  Tablet, Oral) Active. Aspirin (81MG  Tablet DR, Oral) Active. AmLODIPine Besylate (10MG  Tablet, Oral) Active. Proventil HFA (108 (90 Base)MCG/ACT Aerosol Soln, Inhalation) Active. Medications Reconciled  Social History  Caffeine use Carbonated beverages. No alcohol use Tobacco use Never smoker.  Family History  First Degree Relatives No pertinent family history  Other Problems  Back Pain Cholelithiasis Depression Diabetes Mellitus Umbilical Hernia Repair    Vitals  07/16/2018 2:24 PM Weight: 236.38 lb Height: 69in Body Surface Area: 2.22 m Body Mass Index: 34.91 kg/m  Pulse: 107 (Regular)  BP: 136/82 (Sitting, Left Arm, Standard)      Physical Exam   Integumentary Note: 15 x 20 cm fatty mass over his right scapula. Just superior to that is a 3 x 5 cm fatty mass  posterior subcutaneous and mobile. These appear to be lipoma  Chest and Lung Exam Chest and lung exam reveals -quiet, even and easy respiratory effort with no use of accessory muscles and on auscultation, normal breath sounds, no adventitious sounds and normal vocal resonance. Inspection Chest Wall - Normal. Back - normal.  Cardiovascular Cardiovascular examination reveals -on palpation PMI is normal in location and amplitude, no palpable S3 or S4. Normal cardiac borders., normal heart sounds, regular rate and rhythm with no murmurs, carotid auscultation reveals no bruits and normal pedal pulses bilaterally.  Abdomen Note: Scarring noted. Soft nontender without rebound or guarding.  Neurologic Neurologic evaluation reveals -alert and oriented x 3 with no impairment of recent or remote memory. Mental Status-Normal.  Musculoskeletal Normal Exam - Left-Upper Extremity Strength Normal and Lower Extremity Strength Normal. Normal Exam - Right-Upper Extremity Strength Normal, Lower Extremity Weakness.    Assessment & Plan   LIPOMA OF BACK (D17.1) Impression: lipoma upper right back times 2 pt desires excision  Risk of bleeding, infection, seroma, use of drain, cosmetic deformity, nerve injury, vessel injury, cardiovascular event, DVT, stroke, death, organ failure. Observation discussed as well. These are quite large and feel benefit from excision of this point.  Current Plans Pt Education - [facility] Free Text Education/Instructions: discussed with patient and provided information. The pathophysiology of skin & subcutaneous masses was discussed. Natural history risks without surgery were discussed. I recommended surgery to remove the mass. I explained the technique of removal with use of local anesthesia & possible need for more aggressive sedation/anesthesia for patient comfort.  Risks such as bleeding, infection, wound breakdown, heart attack, death, and other  risks were discussed. I noted a good likelihood this will help address the problem.  Possibility that this will not correct all symptoms was explained. Possibility of regrowth/recurrence of the mass was discussed. We will work to minimize complications. Questions were answered. The patient expresses understanding & wishes to proceed with surgery

## 2018-10-18 NOTE — Anesthesia Preprocedure Evaluation (Addendum)
Anesthesia Evaluation  Patient identified by MRN, date of birth, ID band Patient awake    Reviewed: Allergy & Precautions, NPO status , Patient's Chart, lab work & pertinent test results  Airway Mallampati: II  TM Distance: >3 FB Neck ROM: Full    Dental no notable dental hx. (+) Teeth Intact, Dental Advisory Given   Pulmonary sleep apnea (no CPAP) ,    Pulmonary exam normal breath sounds clear to auscultation       Cardiovascular hypertension, Pt. on medications Normal cardiovascular exam Rhythm:Regular Rate:Normal  TTE 2018 EF 60-65%, G1DD, no valvular abnormalities   Neuro/Psych PSYCHIATRIC DISORDERS Anxiety Depression negative neurological ROS     GI/Hepatic negative GI ROS, Neg liver ROS,   Endo/Other  negative endocrine ROSdiabetes, Type 2, Oral Hypoglycemic Agents  Renal/GU negative Renal ROS  negative genitourinary   Musculoskeletal  (+) Arthritis ,   Abdominal   Peds  Hematology negative hematology ROS (+)   Anesthesia Other Findings   Reproductive/Obstetrics                            Anesthesia Physical Anesthesia Plan  ASA: III  Anesthesia Plan: General   Post-op Pain Management:    Induction: Intravenous  PONV Risk Score and Plan: 2 and Ondansetron, Midazolam and Dexamethasone  Airway Management Planned: Oral ETT  Additional Equipment:   Intra-op Plan:   Post-operative Plan: Extubation in OR  Informed Consent: I have reviewed the patients History and Physical, chart, labs and discussed the procedure including the risks, benefits and alternatives for the proposed anesthesia with the patient or authorized representative who has indicated his/her understanding and acceptance.     Dental advisory given  Plan Discussed with: CRNA  Anesthesia Plan Comments:         Anesthesia Quick Evaluation

## 2018-10-18 NOTE — Anesthesia Postprocedure Evaluation (Signed)
Anesthesia Post Note  Patient: Jacob [name]  Procedure(s) Performed: EXCISION RIGHT BACK  LIPOMA X 2 (Right Back)     Patient location during evaluation: PACU Anesthesia Type: General Level of consciousness: awake and alert Pain management: pain level controlled Vital Signs Assessment: post-procedure vital signs reviewed and stable Respiratory status: spontaneous breathing, nonlabored ventilation, respiratory function stable and patient connected to nasal cannula oxygen Cardiovascular status: blood pressure returned to baseline and stable Postop Assessment: no apparent nausea or vomiting Anesthetic complications: no    Last Vitals:  Vitals:   10/18/18 1000 10/18/18 1015  BP: 117/83 117/79  Pulse: 76 83  Resp: 14 15  Temp:    SpO2: 97% 91%    Last Pain:  Vitals:   10/18/18 1045  TempSrc:   PainSc: 3                  Nolah Krenzer L Karthik Whittinghill

## 2018-10-18 NOTE — Anesthesia Procedure Notes (Signed)
Procedure Name: Intubation Date/Time: 10/18/2018 7:39 AM Performed by: Maryella Shivers, CRNA Pre-anesthesia Checklist: Patient identified, Emergency Drugs available, Suction available and Patient being monitored Patient Re-evaluated:Patient Re-evaluated prior to induction Oxygen Delivery Method: Circle system utilized Preoxygenation: Pre-oxygenation with 100% oxygen Induction Type: IV induction Ventilation: Mask ventilation without difficulty Laryngoscope Size: Mac and 3 Tube type: Oral Tube size: 8.0 mm Number of attempts: 1 Airway Equipment and Method: Stylet and Oral airway Placement Confirmation: ETT inserted through vocal cords under direct vision,  positive ETCO2 and breath sounds checked- equal and bilateral Secured at: 22 cm Tube secured with: Tape Dental Injury: Teeth and Oropharynx as per pre-operative assessment

## 2018-10-19 ENCOUNTER — Encounter (HOSPITAL_BASED_OUTPATIENT_CLINIC_OR_DEPARTMENT_OTHER): Payer: Self-pay | Admitting: Surgery

## 2019-01-08 ENCOUNTER — Other Ambulatory Visit: Payer: Self-pay

## 2019-01-08 DIAGNOSIS — I10 Essential (primary) hypertension: Secondary | ICD-10-CM

## 2019-01-08 MED ORDER — AMITRIPTYLINE HCL 10 MG PO TABS: 10.0000 mg | ORAL_TABLET | Freq: Every day | ORAL | 2 refills | Status: DC

## 2019-01-08 MED ORDER — ROSUVASTATIN CALCIUM 20 MG PO TABS: 20.0000 mg | ORAL_TABLET | Freq: Every day | ORAL | 3 refills | Status: DC

## 2019-01-08 MED ORDER — AMLODIPINE BESYLATE 10 MG PO TABS: 10.0000 mg | ORAL_TABLET | Freq: Every day | ORAL | 11 refills | Status: DC

## 2019-01-24 ENCOUNTER — Other Ambulatory Visit: Payer: Self-pay

## 2019-01-24 ENCOUNTER — Ambulatory Visit (INDEPENDENT_AMBULATORY_CARE_PROVIDER_SITE_OTHER): Payer: Medicaid Other | Admitting: Family Medicine

## 2019-01-24 ENCOUNTER — Encounter: Payer: Self-pay | Admitting: Family Medicine

## 2019-01-24 VITALS — BP 138/82 | HR 61 | Wt 222.0 lb

## 2019-01-24 DIAGNOSIS — E1149 Type 2 diabetes mellitus with other diabetic neurological complication: Secondary | ICD-10-CM | POA: Diagnosis not present

## 2019-01-24 DIAGNOSIS — R350 Frequency of micturition: Secondary | ICD-10-CM

## 2019-01-24 DIAGNOSIS — F339 Major depressive disorder, recurrent, unspecified: Secondary | ICD-10-CM

## 2019-01-24 DIAGNOSIS — I10 Essential (primary) hypertension: Secondary | ICD-10-CM

## 2019-01-24 DIAGNOSIS — E1165 Type 2 diabetes mellitus with hyperglycemia: Secondary | ICD-10-CM | POA: Diagnosis not present

## 2019-01-24 DIAGNOSIS — IMO0002 Reserved for concepts with insufficient information to code with codable children: Secondary | ICD-10-CM

## 2019-01-24 LAB — POCT UA - MICROSCOPIC ONLY

## 2019-01-24 LAB — POCT GLYCOSYLATED HEMOGLOBIN (HGB A1C): HbA1c, POC (controlled diabetic range): 6.2 % (ref 0.0–7.0)

## 2019-01-24 LAB — POCT URINALYSIS DIP (MANUAL ENTRY)
Bilirubin, UA: NEGATIVE
Glucose, UA: NEGATIVE mg/dL
Ketones, POC UA: NEGATIVE mg/dL
Leukocytes, UA: NEGATIVE
Nitrite, UA: NEGATIVE
Protein Ur, POC: NEGATIVE mg/dL
Spec Grav, UA: 1.025 (ref 1.010–1.025)
Urobilinogen, UA: 0.2 E.U./dL
pH, UA: 6 (ref 5.0–8.0)

## 2019-01-24 LAB — POCT UA - MICROALBUMIN
Albumin/Creatinine Ratio, Urine, POC: <30
Creatinine, POC: 200 mg/dL
Microalbumin Ur, POC: 30 mg/L

## 2019-01-24 MED ORDER — METFORMIN HCL 1000 MG PO TABS: 1000.0000 mg | ORAL_TABLET | Freq: Two times a day (BID) | ORAL | 11 refills | Status: DC

## 2019-01-24 MED ORDER — ROSUVASTATIN CALCIUM 20 MG PO TABS: 20.0000 mg | ORAL_TABLET | Freq: Every day | ORAL | 3 refills | Status: DC

## 2019-01-24 MED ORDER — AMLODIPINE BESYLATE 10 MG PO TABS: 10.0000 mg | ORAL_TABLET | Freq: Every day | ORAL | 11 refills | Status: DC

## 2019-01-24 MED ORDER — TAMSULOSIN HCL 0.4 MG PO CAPS: 0.4000 mg | ORAL_CAPSULE | Freq: Every day | ORAL | 3 refills | Status: DC

## 2019-01-24 MED ORDER — MELATONIN 5 MG PO CAPS: 1.0000 | ORAL_CAPSULE | Freq: Every day | ORAL | 1 refills | Status: DC

## 2019-01-24 NOTE — Progress Notes (Signed)
Subjective: Chief Complaint  Patient presents with  . Annual Exam    HPI: Jacob [name] is a 63 y.o. presenting to clinic today to discuss the following:  HTN Patient is well controlled today. [name] not taking his BP at home. [name] taking his BP medication with no reported side effects.  Depression - needs psych due to refractory depression and anxiety. Says "can't you give me anything" but then refuses medications offered saying "I have tried that in the past and nothing helps". No SI or HI. He states he has tried "several medications" in the past and they have never helped him so doesn't want to start anything today. He states his father passed away and since that time his mother in law has told him he will have to find another place to live. This has him very distressed as he states he has never lived on his own and doesn't know how to care for himself. I advised him to seek help at a local downtown crisis center/shelter.  T2DM Poorly controlled in the past but his A1c is at goal today at 6.2%. Unclear as to why as his poor dietary habits continue. I am concerned he is just eating less due to his recent depression and anxieyt.  ROS noted in HPI.    Social History   Tobacco Use  Smoking Status Never Smoker  Smokeless Tobacco Never Used    Objective: BP 138/82   Pulse 61   Wt 222 lb (100.7 kg)   SpO2 99%   BMI 32.78 kg/m  Vitals and nursing notes reviewed  Physical Exam Gen: Alert and Oriented x 3, NAD HEENT: Normocephalic, atraumatic CV: RRR, no murmurs, normal S1, S2 split Resp: CTAB, no wheezing, rales, or rhonchi, comfortable work of breathing Ext: no clubbing, cyanosis, or edema Skin: warm, dry, intact, no rashes  Assessment/Plan:  Type II diabetes mellitus with neurological manifestations, uncontrolled (HCC) Controlled today but I am concerned it is due to not eating from depression and not improved dietary habits and exercise. - See my note on  depression problem. - Cont Metformin 2000mg  daily  Essential hypertension, benign Well controlled at today's visit. Advised patient to start checking his BP at home and keep a record so I can review it at his next appointment. - Cont amlodipine 10mg   Urinary frequency Urinary frequency but no dysuria, fever, chills, or related infectious symptoms. Most likely from BPH. Offered PSA testing but patient declined at this time. - U/A was negative for signs of UTI  Depression, recurrent ([place]) Not well controlled and patient is no longer taking medication. - Referral to Psychology and Psychiatry given he has had poor response to multiple first line medications in the past. - No SI or HI but patient is distressed about his housing situation to which I am able to provide little relief.  - He has been in touch with a crisis center/shelter downtown here in [place] and I encouraged him to continue to seek their support.   PATIENT EDUCATION PROVIDED: See AVS    Diagnosis and plan along with any newly prescribed medication(s) were discussed in detail with this patient today. The patient verbalized understanding and agreed with the plan. Patient advised if symptoms worsen return to clinic or ER.    Orders Placed This Encounter  Procedures  . Urine Culture  . PSA    Standing Status:   Future    Standing Expiration Date:   04/25/2019  . HgB  A1c  . POCT urinalysis dipstick  . POCT UA - Microalbumin  . POCT UA - Microscopic Only    Meds ordered this encounter  Medications  . DISCONTD: amLODipine (NORVASC) 10 MG tablet    Sig: Take 1 tablet (10 mg total) by mouth daily before breakfast.    Dispense:  30 tablet    Refill:  11  . rosuvastatin (CRESTOR) 20 MG tablet    Sig: Take 1 tablet (20 mg total) by mouth daily.    Dispense:  90 tablet    Refill:  3  . metFORMIN (GLUCOPHAGE) 1000 MG tablet    Sig: Take 1 tablet (1,000 mg total) by mouth 2 (two) times daily with a meal.    Dispense:   60 tablet    Refill:  11  . tamsulosin (FLOMAX) 0.4 MG CAPS capsule    Sig: Take 1 capsule (0.4 mg total) by mouth daily.    Dispense:  30 capsule    Refill:  3  . Melatonin 5 MG CAPS    Sig: Take 1 capsule (5 mg total) by mouth at bedtime.    Dispense:  30 capsule    Refill:  1  . amLODipine (NORVASC) 10 MG tablet    Sig: Take 1 tablet (10 mg total) by mouth daily before breakfast.    Dispense:  30 tablet    Refill:  [place], DO 01/24/2019, 3:17 PM PGY-3 [place]

## 2019-01-24 NOTE — Patient Instructions (Addendum)
It was great to meet you today! Thank you for letting me participate in your care!  Today, we discussed your blood pressure and diabetes. I have obtained several labs to see where you are on those numbers and will contact you if I need to make any medication changes.  Be well, Jacob Rutherford, DO PGY-3, [facility]

## 2019-01-31 NOTE — Assessment & Plan Note (Signed)
Not well controlled and patient is no longer taking medication. - Referral to Psychology and Psychiatry given he has had poor response to multiple first line medications in the past. - No SI or HI but patient is distressed about his housing situation to which I am able to provide little relief.  - He has been in touch with a crisis center/shelter downtown here in [place] and I encouraged him to continue to seek their support.

## 2019-01-31 NOTE — Assessment & Plan Note (Signed)
Well controlled at today's visit. Advised patient to start checking his BP at home and keep a record so I can review it at his next appointment. - Cont amlodipine 10mg 

## 2019-01-31 NOTE — Assessment & Plan Note (Signed)
Urinary frequency but no dysuria, fever, chills, or related infectious symptoms. Most likely from BPH. Offered PSA testing but patient declined at this time. - U/A was negative for signs of UTI

## 2019-01-31 NOTE — Assessment & Plan Note (Signed)
Controlled today but I am concerned it is due to not eating from depression and not improved dietary habits and exercise. - See my note on depression problem. - Cont Metformin 2000mg  daily

## 2019-03-01 ENCOUNTER — Emergency Department (HOSPITAL_COMMUNITY): Payer: Medicaid Other

## 2019-03-01 ENCOUNTER — Encounter (HOSPITAL_COMMUNITY): Payer: Self-pay | Admitting: Emergency Medicine

## 2019-03-01 ENCOUNTER — Emergency Department (HOSPITAL_COMMUNITY)
Admission: EM | Admit: 2019-03-01 | Discharge: 2019-03-01 | Disposition: A | Discharge status: Home / Self Care | Payer: Medicaid Other | Attending: Emergency Medicine | Admitting: Emergency Medicine

## 2019-03-01 ENCOUNTER — Other Ambulatory Visit: Payer: Self-pay

## 2019-03-01 DIAGNOSIS — Z7984 Long term (current) use of oral hypoglycemic drugs: Secondary | ICD-10-CM | POA: Diagnosis not present

## 2019-03-01 DIAGNOSIS — Z79899 Other long term (current) drug therapy: Secondary | ICD-10-CM | POA: Diagnosis not present

## 2019-03-01 DIAGNOSIS — F329 Major depressive disorder, single episode, unspecified: Secondary | ICD-10-CM | POA: Diagnosis not present

## 2019-03-01 DIAGNOSIS — R0602 Shortness of breath: Secondary | ICD-10-CM | POA: Diagnosis present

## 2019-03-01 DIAGNOSIS — I10 Essential (primary) hypertension: Secondary | ICD-10-CM | POA: Insufficient documentation

## 2019-03-01 DIAGNOSIS — F32A Depression, unspecified: Secondary | ICD-10-CM

## 2019-03-01 DIAGNOSIS — Z7982 Long term (current) use of aspirin: Secondary | ICD-10-CM | POA: Diagnosis not present

## 2019-03-01 DIAGNOSIS — E1140 Type 2 diabetes mellitus with diabetic neuropathy, unspecified: Secondary | ICD-10-CM | POA: Diagnosis not present

## 2019-03-01 DIAGNOSIS — F419 Anxiety disorder, unspecified: Secondary | ICD-10-CM | POA: Insufficient documentation

## 2019-03-01 LAB — URINALYSIS, ROUTINE W REFLEX MICROSCOPIC
Bilirubin Urine: NEGATIVE
Glucose, UA: NEGATIVE mg/dL
Hgb urine dipstick: NEGATIVE
Ketones, ur: 5 mg/dL — AB
Leukocytes,Ua: NEGATIVE
Nitrite: NEGATIVE
Protein, ur: NEGATIVE mg/dL
Specific Gravity, Urine: 1.026 (ref 1.005–1.030)
pH: 5 (ref 5.0–8.0)

## 2019-03-01 MED ORDER — HYDROXYZINE HCL 25 MG PO TABS: 25.0000 mg | ORAL_TABLET | Freq: Four times a day (QID) | ORAL | 0 refills | Status: DC

## 2019-03-01 MED ORDER — LORAZEPAM 0.5 MG PO TABS
0.5000 mg | ORAL_TABLET | Freq: Once | ORAL | Status: AC
  Administered 2019-03-01: 0.5 mg via ORAL
  Filled 2019-03-01: qty 1

## 2019-03-01 MED ORDER — LORAZEPAM 1 MG PO TABS
1.0000 mg | ORAL_TABLET | Freq: Once | ORAL | Status: AC
  Administered 2019-03-01: 19:00:00 1 mg via ORAL
  Filled 2019-03-01: qty 1

## 2019-03-01 NOTE — Progress Notes (Addendum)
TOC CM spoke to pt at bedside. Provided pt with information on [employer] and [facility]. And information on Medicaid Transportation. Provided pt with [facility] number. Gave verbal consent to complete referral with [facility] for housing.  [name] not sure how long he will be able to stay in the home as his stepmother is selling his deceased father's home which he has lived in basement for 29 years. States his income is limited $600 per month for disability. Explained he will need to follow up with resources provided. Taxi voucher provided to get home. [place], [place] ED TOC CM [phone]

## 2019-03-01 NOTE — ED Triage Notes (Signed)
Per [facility] pt from [facility] for SOB. Vitals: 130/98, 88HR, 18R, 99% on RA. CBG 133.

## 2019-03-01 NOTE — Discharge Instructions (Signed)
Take Hydroxyzine as needed for anxiety and to help you sleep Please follow up with [facility] for counseling appointment Please go to the [facility] to get help with housing and financial resources Return to the Emergency Department for worsening symptoms

## 2019-03-01 NOTE — ED Notes (Signed)
Pt c/o anxiety getting worse and would like something for it. Pt refusing chest xray.  Made Claiborne Billings PA aware of refusing xray and requesting for medications for anxiety.

## 2019-03-01 NOTE — ED Triage Notes (Signed)
Pt reports that he has lived in his father's basement for the pas 28-29 years. 2 months ago his father died at 62 yo with Alzhiemer's and patient reports he watched his father take his last breath and to be taken out of the house in a body bag. Reports that his stepmother is now telling him he needs to find somewhere to live as she is selling the house due to not able to afford it on one income. Pt is having stress and anxiety about having to go live on his on. States, "It's just hard to find a woman that you can cuddle with, because no one wants someone who is sick. I just want to be cuddled by a woman". Denies SI or HI.

## 2019-03-01 NOTE — ED Provider Notes (Signed)
[facility] DEPT Provider Note   CSN: [id number] Arrival date & time: 03/01/19  1611   History   Chief Complaint Chief Complaint  Patient presents with  . Shortness of Breath  . Stress  . Depression    HPI Jacob [name] is a 63 y.o. male who presents with SOB, difficulty urinating, and stress/depression. The patient states that everything was fine until about 3 months ago when his stepmother gave him a letter that told him he needed to move out of his father's house. His father died and she has to sell the house because she can't afford to stay there on one income. The patient states he's been living in an apartment in the house for about 28-29 years and has never had to pay any bills. He can do his ADLs but he has never had any financial responsibility or been taught how to take care of himself and now he is "scared to death". He states that he has had long standing depression and has been on multiple SSRIs that haven't helped. He denies SI or HI but states that sometimes he wish God would take him rather than go through this difficult time. He states that his mental problems are manifesting as physical problems now. He reports a headache, intermittent chest tightness, SOB when he's anxious, and abdominal cramping all which he believes is from stress. He states that he is starved for affection and doesn't have good social support. He wants a woman to take care of him and to hold him. He doesn't think he needs to go to a mental health hospital at this time he just is very scared. He went to [facility] earlier today but was sent to the ED because he was short of breath from a panic attack   HPI  Past Medical History:  Diagnosis Date  . Anxiety   . Arthritis    "hurt qwhere in my bones; especially early in the morning" (06/08/2016)  . Chronic cough 08-10-11   more than 6 yrs from sinus drainage  . 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   . Complication of anesthesia    prolonged emergence  . Depression    depression(due to childhood issues)  . 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 Erectile dysfunction 08/23/2013  . Gout, unspecified 06/29/2006   Qualifier: Diagnosis of  By: Herma Ard    . High cholesterol   . History of gout   . HYPERLIPIDEMIA 02/19/2007   Qualifier: Diagnosis of  By: Darylene Price MD, Elta Guadeloupe    . Hypertension 08-10-11   tx. meds  . Increased urinary frequency   . Kidney stones 08-10-11   once in past, none recent  . Methylprednisolone-induced hypoxia 08/23/2011  . Neuromuscular disorder ([place]) 08-10-11   "burning/tingling sensation In feet"  . Obesity   . Obesity, Class II, BMI 35-39.9, with comorbidity 06/29/2006  . OSA (obstructive sleep apnea)    "couldn't deal w/CPAP" (06/08/2016)  . Preoperative clearance   . RUQ pain 06/09/2016  . Shortness of breath 08-10-11   with increased activity only  . Syncope and collapse 06/09/2016  . Syncope and collapse 06/09/2016  . Type II diabetes mellitus ([place])   . Umbilical hernia     Patient Active  Problem List   Diagnosis Date Noted  . Severe recurrent major depression without psychotic features ([place]) 10/04/2018  . Depression, recurrent ([place]) 09/17/2018  . Urinary frequency 02/09/2018  . Syncope and collapse 06/09/2016  . Hepatic steatosis 06/09/2016  . Biliary colic   . Uncontrolled type 2 diabetes mellitus with diabetic neuropathy ([place])   . ACS (acute coronary syndrome) ([place]) 06/08/2016  . Erectile dysfunction 08/23/2013  . Ventral hernia with obstruction 09/09/2011  . 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 OSA (obstructive sleep apnea) 08/23/2011  . Colonic polyp 08/04/2011  . Cataract  12/01/2010  . Chest pain 07/15/2010  . Type II diabetes mellitus with neurological manifestations, uncontrolled ([place]) 10/03/2008  . HYPERLIPIDEMIA 02/19/2007  . Gout, unspecified 06/29/2006  . Obesity, Class II, BMI 35-39.9, with comorbidity 06/29/2006  . MDD (major depressive disorder), recurrent episode, mild ([place]) 06/29/2006  . Anxiety state 06/29/2006  . Essential hypertension, benign 06/29/2006    Past Surgical History:  Procedure Laterality Date  . CATARACT EXTRACTION W/ INTRAOCULAR LENS  IMPLANT, BILATERAL Bilateral 08/10/2011 - ~ 2014   "right - left"  . CHOLECYSTECTOMY N/A 06/10/2016   Procedure: LAPAROSCOPIC CHOLECYSTECTOMY WITH INTRAOPERATIVE CHOLANGIOGRAM;  Surgeon: Erroll Luna, MD;  Location: [facility];  Service: General;  Laterality: N/A;  . COLONOSCOPY W/ BIOPSIES AND POLYPECTOMY  2013  . HERNIA REPAIR    . LIPOMA EXCISION Right 10/18/2018   Procedure: EXCISION RIGHT BACK  LIPOMA X 2;  Surgeon: Erroll Luna, MD;  Location: [facility];  Service: General;  Laterality: Right;  . VENTRAL HERNIA REPAIR  08/22/2011   Procedure: HERNIA REPAIR VENTRAL ADULT;  Surgeon: Adin Hector, MD;  Location: [facility];  Service: General;  Laterality: N/A;  incarcerated        Home Medications    Prior to Admission medications   Medication Sig Start Date End Date Taking? Authorizing Provider  amitriptyline (ELAVIL) 10 MG tablet Take 1 tablet (10 mg total) by mouth at bedtime. 01/08/19   Nuala Alpha, DO  amLODipine (NORVASC) 10 MG tablet Take 1 tablet (10 mg total) by mouth daily before breakfast. 01/24/19   Nuala Alpha, DO  aspirin 81 MG EC tablet TAKE 1 TABLET (81 MG TOTAL) BY MOUTH DAILY. 07/08/16   Ronnie Doss M, DO  HYDROcodone-acetaminophen (NORCO/VICODIN) 5-325 MG tablet Take 1 tablet by mouth every 6 (six) hours as needed for moderate pain. 10/18/18   Cornett, Marcello Moores, MD  ibuprofen (ADVIL) 800 MG tablet Take 1 tablet (800 mg total) by mouth every 8 (eight)  hours as needed. 10/18/18   Cornett, Marcello Moores, MD  Melatonin 5 MG CAPS Take 1 capsule (5 mg total) by mouth at bedtime. 01/24/19   Nuala Alpha, DO  metFORMIN (GLUCOPHAGE) 1000 MG tablet Take 1 tablet (1,000 mg total) by mouth 2 (two) times daily with a meal. 01/24/19   Lockamy, Timothy, DO  rosuvastatin (CRESTOR) 20 MG tablet Take 1 tablet (20 mg total) by mouth daily. 01/24/19   Nuala Alpha, DO  tamsulosin (FLOMAX) 0.4 MG CAPS capsule Take 1 capsule (0.4 mg total) by mouth daily. 01/24/19   Nuala Alpha, DO    Family History Family History  Problem Relation Age of Onset  . Hyperlipidemia Mother   . Diabetes Mother   . Heart disease Father   . Hyperlipidemia Father   . Colon cancer Neg Hx     Social History Social History   Tobacco Use  . Smoking status: Never Smoker  . Smokeless tobacco: Never Used  Substance Use  Topics  . Alcohol use: No  . Drug use: No     Allergies   Methylprednisolone acetate   Review of Systems Review of Systems  Constitutional: Negative for fever.  Respiratory: Positive for chest tightness and shortness of breath.   Cardiovascular: Negative for chest pain.  Gastrointestinal: Negative for abdominal pain.  Genitourinary: Positive for difficulty urinating.  Neurological: Positive for headaches.  Psychiatric/Behavioral: Positive for dysphoric mood. Negative for self-injury, sleep disturbance and suicidal ideas. The patient is nervous/anxious.   All other systems reviewed and are negative.    Physical Exam Updated Vital Signs BP (!) 148/89 (BP Location: Right Arm)   Pulse 80   Temp 98.2 F (36.8 C) (Oral)   SpO2 100%   Physical Exam Vitals signs and nursing note reviewed.  Constitutional:      General: [name] not in acute distress.    Appearance: [name] well-developed. [name] not ill-appearing.  HENT:     Head: Normocephalic and atraumatic.  Eyes:     General: No scleral icterus.       Right eye: No discharge.        Left eye: No  discharge.     Conjunctiva/sclera: Conjunctivae normal.     Pupils: Pupils are equal, round, and reactive to light.  Neck:     Musculoskeletal: Normal range of motion.  Cardiovascular:     Rate and Rhythm: Normal rate and regular rhythm.  Pulmonary:     Effort: Pulmonary effort is normal. No respiratory distress.     Breath sounds: Normal breath sounds.  Abdominal:     General: There is no distension.     Palpations: Abdomen is soft.     Tenderness: There is no abdominal tenderness.  Skin:    General: Skin is warm and dry.  Neurological:     Mental Status: [name] alert and oriented to person, place, and time.  Psychiatric:        Attention and Perception: Attention normal.        Mood and Affect: Mood is anxious. Affect is tearful.        Speech: Speech normal. [name] communicative.        Behavior: Behavior normal. Behavior is cooperative.        Thought Content: Thought content is not paranoid or delusional. Thought content does not include homicidal or suicidal ideation. Thought content does not include homicidal or suicidal plan.        Cognition and Memory: Cognition normal.        Judgment: Judgment normal.      ED Treatments / Results  Labs (all labs ordered are listed, but only abnormal results are displayed) Labs Reviewed  URINALYSIS, ROUTINE W REFLEX MICROSCOPIC - Abnormal; Notable for the following components:      Result Value   Ketones, ur 5 (*)    All other components within normal limits    EKG None  Radiology No results found.  Procedures Procedures (including critical care time)  Medications Ordered in ED Medications - No data to display   Initial Impression / Assessment and Plan / ED Course  I have reviewed the triage vital signs and the nursing notes.  Pertinent labs & imaging results that were available during my care of the patient were reviewed by me and considered in my medical decision making (see chart for details).  63 year old male  presents with anxiety, SOB, difficulty urinating. [name] hypertensive but otherwise vitals are normal.  He clearly relates that his symptoms are from stress and likely panic attacks from the prospect of losing his housing and poor social support. CXR was ordered but pt refused. [name] also asking for his prostate to be checked. Will check a UA.  UA is normal. Likely symptoms are related to BPH. I recommended for him to talk to his PCP regarding his prostate issues.   CM/SW was consulted regarding his social issues and they gave him resources for housing and a cab voucher. Although he was transported from [place] he did not actually see a provider there and was not sent here for medical clearance. At this time he does not express any SI/HI, AVH and I do not think he needs a TTS consult. He was given Ativan for anxiety and feels somewhat better. [name] requesting to be started on meds. Will give him rx for Hydroxyzine. He was encouraged to f/u with [facility] as an outpatient and to return if he starts to have any SI. He was also encouraged to follow up on housing resources given to him and to go to the [facility] as they may be able to assist him better than the ED can.  Final Clinical Impressions(s) / ED Diagnoses   Final diagnoses:  Depression, unspecified depression type  Anxiety    ED Discharge Orders    None       Recardo Evangelist, PA-C 03/01/19 2043    Lucrezia Starch, MD 03/02/19 [phone]

## 2019-03-08 ENCOUNTER — Telehealth: Payer: Self-pay

## 2019-03-08 ENCOUNTER — Other Ambulatory Visit: Payer: Self-pay | Admitting: Family Medicine

## 2019-03-08 MED ORDER — HYDROXYZINE HCL 10 MG PO TABS: 10.0000 mg | ORAL_TABLET | Freq: Three times a day (TID) | ORAL | 0 refills | Status: DC | PRN

## 2019-03-08 NOTE — Telephone Encounter (Signed)
Patient calls nurse line stating he was recently seen in the ED for a panic attack and prescribed Hydroxyzine. Patient is asking for a refill on this from PCP. Patient stated, "this really helped me." Patient requested Seroquel, however I informed him he would need to be seen before this could be prescribed. Per chart notes, PCP referred him at last office visit to behavioral health. Per referral note, [facility] tried contacting him to schedule with no answer. I informed patient of this and gave him their phone number to call and set up an apt. Patient was very appreciative of this.

## 2019-03-08 NOTE — Progress Notes (Unsigned)
Patient seen in ED for anxiety and got Hydroxyzine and requested refill from me as it "helped him a lot". Agree with one refill and re-emphasized he follow up with Behavioral Health to whom I referred him to at last visit with me. He has anxiety and depression resistant to first line medications (reported to me from patient) so I think he would benefit greatly from the care of a [facility] specialist.

## 2019-03-13 ENCOUNTER — Telehealth: Payer: Self-pay

## 2019-03-13 ENCOUNTER — Telehealth: Payer: Self-pay | Admitting: *Deleted

## 2019-03-13 ENCOUNTER — Telehealth: Payer: Self-pay | Admitting: Licensed Clinical Social Worker

## 2019-03-13 MED ORDER — HYDROXYZINE HCL 10 MG PO TABS: 10.0000 mg | ORAL_TABLET | Freq: Three times a day (TID) | ORAL | 0 refills | Status: DC | PRN

## 2019-03-13 NOTE — Telephone Encounter (Signed)
Patient walks into [facility] tearful asking to speak with someone. Patient was brought into the nurse room where is provided supportive listening. Patient states his home is about to be sold and he needs a new place to live. Patient stated his father recently died and he has never lived on his own and does not know how to go about getting housing on his little income. Patient asked to see a PSY provider, I provided him a handout of several in the area. Patient scheduled to see PCP next week and hopefully can be connected to LCSW for housing resources. Patient died any suicidal ideations and was very appreciative of my time.

## 2019-03-13 NOTE — Telephone Encounter (Signed)
Patient verified DOB Patient presented to our clinic on today requesting a psychiatrist. MA politely informed patient on only a SW being present and no psychiatrist at the office. Patient was provided counseling resources and contact information.  Patient shared that he is a 63 year old male who has resided with his father his entire life. Patient has never worked and lived in the basement of the fathers home. Patients father passed away recently and the home he resided in is on the market. Patient expresses his fear of adjusting to new home life and supporting himself.  Patient has a PCP appointment 11/17 with family medicine.  Patient is very tearful and distraught. MA spoke with SW and intern to determine the best way to support the patient at this time.

## 2019-03-13 NOTE — Telephone Encounter (Signed)
SW Intern spoke with pt to provide emotional support. Pt reports he is having difficulty processing grief of his father's passing 2 months ago and experiencing psychosocial stressors triggered by attempts to secure new housing situation, financial worry, loneliness, and concerns about losing Medicaid coverage. Pt has lived in basement of father's home most of his adult life. House is being sold and pt is forced to move in coming months.    Pt reports he receives SSDI $623/month. Pt has Medicaid coverage but is afraid he will lose it after he gets inheritance from father's death.   Pt reports he has been diagnosed with Major Depressive Disorder in the past. SW Intern encouraged pt to discuss antidepressant options at upcoming apt with PCP 03/19/19 per pt's interest in medication management. Pt identified social supports and calming activities he can practice to decrease stress. Pt reports his sister is helping him look for housing and he has also been in contact with [employer]. SW Intern commended pt for his efforts and encouraged him to continue to reach out to supports during his life transition. Pt denied SI/HI.

## 2019-03-13 NOTE — Telephone Encounter (Signed)
Patient walks into clinic asking for recent hydroxyzine medication to be transferred [facility]. I called Jacob [name] and cancelled the RX and [facility].

## 2019-03-19 ENCOUNTER — Other Ambulatory Visit: Payer: Self-pay

## 2019-03-19 ENCOUNTER — Ambulatory Visit (INDEPENDENT_AMBULATORY_CARE_PROVIDER_SITE_OTHER): Payer: Medicaid Other | Admitting: Family Medicine

## 2019-03-19 VITALS — BP 140/80 | HR 103 | Wt 219.6 lb

## 2019-03-19 DIAGNOSIS — Z599 Problem related to housing and economic circumstances, unspecified: Secondary | ICD-10-CM | POA: Diagnosis not present

## 2019-03-19 DIAGNOSIS — F411 Generalized anxiety disorder: Secondary | ICD-10-CM | POA: Diagnosis not present

## 2019-03-19 DIAGNOSIS — F330 Major depressive disorder, recurrent, mild: Secondary | ICD-10-CM

## 2019-03-19 DIAGNOSIS — R350 Frequency of micturition: Secondary | ICD-10-CM

## 2019-03-19 LAB — POCT URINALYSIS DIP (MANUAL ENTRY)
Bilirubin, UA: NEGATIVE
Blood, UA: NEGATIVE
Glucose, UA: NEGATIVE mg/dL
Ketones, POC UA: NEGATIVE mg/dL
Leukocytes, UA: NEGATIVE
Nitrite, UA: NEGATIVE
Protein Ur, POC: NEGATIVE mg/dL
Spec Grav, UA: 1.025 (ref 1.010–1.025)
Urobilinogen, UA: 0.2 E.U./dL
pH, UA: 5.5 (ref 5.0–8.0)

## 2019-03-19 MED ORDER — BUSPIRONE HCL 10 MG PO TABS: 10.0000 mg | ORAL_TABLET | Freq: Two times a day (BID) | ORAL | 0 refills | Status: DC

## 2019-03-19 MED ORDER — MIRTAZAPINE 15 MG PO TABS: 15.0000 mg | ORAL_TABLET | Freq: Every day | ORAL | 0 refills | Status: DC

## 2019-03-19 NOTE — Progress Notes (Signed)
Subjective: Chief Complaint  Patient presents with  . Urinary Frequency    HPI: Jacob [name] is a 63 y.o. presenting to clinic today to discuss the following:  Urinary Frequency Patient is reporting that he is urinating frequently more and more. [name] concerned. He reports this has been ongoing for the past few months but he hasn't paid it much attention due to his housing situation. He does states that it is getting worse in the past few weeks. He denies fever, chills, flank pain, dysuria, or urinary urgency.  No constipation.  Adjustment Disorder with Anxiety/MDD Patient is extremely anxious and emotionally distraught. He has a history of anxiety and depression and has been started on Zoloft before but says he didn't like the way it made him feel so he stopped taking it. He has no active or passive SI or HI. He states he does wish "God would just take him" if things aren't going to get better in reference to his housing issues. He was very close to his dad who recently passed. I have referred him to [facility] and [facility] numerous times. At today's appointment he is open to trying some medications to help manage his anxiety and depression. He does endorse feeling depressed, lack of sleep, lack of appetite, loss of interest in doing anything.  Housing Concerns Patient again is expressing concerns about not having housing as his dad has passed and he is very, very anxious about where he will live. His mother in-law and his siblings have told him he will have to find housing elsewhere. He states he has never had to do things like "find a place, apply for an apartment, pay bills, etc". [name] afraid he will end up homeless. I did discuss with patient I am happy to again give him resources but he has to contact these places and take advantage of what will be made available to him.  ROS noted in HPI.    Social History   Tobacco Use  Smoking Status Never Smoker  Smokeless Tobacco  Never Used    Objective: BP 140/80   Pulse (!) 103   Wt 219 lb 9.6 oz (99.6 kg)   SpO2 99%   BMI 32.43 kg/m  Vitals and nursing notes reviewed  Physical Exam Gen: Alert and Oriented x 3, NAD CV: RRR, no murmurs, normal S1, S2 split Resp: CTAB, no wheezing, rales, or rhonchi, comfortable work of breathing GU: declined Ext: no clubbing, cyanosis, or edema Skin: warm, dry, intact, no rashes Psych: anxious mood, judgement intact  Results for orders placed or performed in visit on 03/19/19 (from the past 72 hour(s))  POCT urinalysis dipstick     Status: None   Collection Time: 03/19/19  4:40 PM  Result Value Ref Range   Color, UA yellow yellow   Clarity, UA clear clear   Glucose, UA negative negative mg/dL   Bilirubin, UA negative negative   Ketones, POC UA negative negative mg/dL   Spec Grav, UA 1.025 1.010 - 1.025   Blood, UA negative negative   pH, UA 5.5 5.0 - 8.0   Protein Ur, POC negative negative mg/dL   Urobilinogen, UA 0.2 0.2 or 1.0 E.U./dL   Nitrite, UA Negative Negative   Leukocytes, UA Negative Negative  PSA     Status: Abnormal   Collection Time: 03/19/19  4:54 PM  Result Value Ref Range   Prostate Specific Ag, Serum 61.0 (H) 0.0 - 4.0 ng/mL  Comment: Roche ECLIA methodology. According to the American Urological Association, Serum PSA should decrease and remain at undetectable levels after radical prostatectomy. The AUA defines biochemical recurrence as an initial PSA value 0.2 ng/mL or greater followed by a subsequent confirmatory PSA value 0.2 ng/mL or greater. Values obtained with different assay methods or kits cannot be used interchangeably. Results cannot be interpreted as absolute evidence of the presence or absence of malignant disease.   Specimen Status     Status: None (Preliminary result)   Collection Time: 03/19/19  4:54 PM  Result Value Ref Range   WBC WILL FOLLOW    RBC WILL FOLLOW    Hemoglobin WILL FOLLOW    Hematocrit WILL FOLLOW     MCV WILL FOLLOW    MCH WILL FOLLOW    MCHC WILL FOLLOW    RDW WILL FOLLOW    Platelets WILL FOLLOW    Neutrophils WILL FOLLOW    Lymphs WILL FOLLOW    Monocytes WILL FOLLOW    Eos WILL FOLLOW    Basos WILL FOLLOW    Neutrophils Absolute WILL FOLLOW    Lymphocytes Absolute WILL FOLLOW    Monocytes Absolute WILL FOLLOW    EOS (ABSOLUTE) WILL FOLLOW    Basophils Absolute WILL FOLLOW    Immature Granulocytes WILL FOLLOW    Immature Grans (Abs) WILL FOLLOW     Assessment/Plan:  Housing problems Patient continues to have housing issues which is significantly impacting his anxiety and depression - Referral made to Neoma Laming here in [facility] for house needs made via CCM.  MDD (major depressive disorder), recurrent episode, mild (HCC) NO SI or HI but not well controlled. Has not responded well to SSRIs in the past according to patient.  - Buspar 10mg  BID to start for depression and anxiety given he has history of poor response to first line SSRIs - Seroquel to help also with anxiety and also with sleep. - F/u in 2 weeks in my clinic. - Given several handouts for community resources that will help find him both Psychiatry and Counseling services that take Medicaid. Stressed again that he needs to be seen by Psych.  Urinary frequency PSA significantly elevated at >60. Still more likely to be BPH than prostate cancer but patient will need further evaluation from Urology. - Refilling Flomax today - Referral to Urology   PATIENT EDUCATION PROVIDED: See AVS    Diagnosis and plan along with any newly prescribed medication(s) were discussed in detail with this patient today. The patient verbalized understanding and agreed with the plan. Patient advised if symptoms worsen return to clinic or ER.    Orders Placed This Encounter  Procedures  . Specimen Status  . Ambulatory referral to Chronic Care Management Services    Referral Priority:   Routine    Referral Type:   Consultation     Referral Reason:   Care Coordination    Number of Visits Requested:   1  . Ambulatory referral to Urology    Referral Priority:   Urgent    Referral Type:   Consultation    Referral Reason:   Specialty Services Required    Requested Specialty:   Urology    Number of Visits Requested:   1  . POCT urinalysis dipstick    Meds ordered this encounter  Medications  . busPIRone (BUSPAR) 10 MG tablet    Sig: Take 1 tablet (10 mg total) by mouth 2 (two) times daily.    Dispense:  60  tablet    Refill:  0  . mirtazapine (REMERON) 15 MG tablet    Sig: Take 1 tablet (15 mg total) by mouth at bedtime.    Dispense:  30 tablet    Refill:  0  . tamsulosin (FLOMAX) 0.4 MG CAPS capsule    Sig: Take 1 capsule (0.4 mg total) by mouth daily.    Dispense:  30 capsule    Refill:  [place], DO 03/19/2019, 4:07 PM PGY-3 [place]

## 2019-03-19 NOTE — Patient Instructions (Addendum)
It was great to see you today! Thank you for letting me participate in your care!  Today, we discussed your anxiety and depression and once again I recommend you see a Psychiatrist and seek professional counseling. I have again given you resources to seek out these services.   I have started you on two medication to help control your anxiety and depression. Please take them as prescribed.  I have also gotten some labs to further investigate your urinary frequency. I will call you if anything is abnormal.  Be well, Harolyn Rutherford, DO PGY-3, [facility]

## 2019-03-20 LAB — SPECIMEN STATUS

## 2019-03-21 DIAGNOSIS — Z599 Problem related to housing and economic circumstances, unspecified: Secondary | ICD-10-CM | POA: Insufficient documentation

## 2019-03-21 LAB — SPECIMEN STATUS REPORT

## 2019-03-21 LAB — PSA: Prostate Specific Ag, Serum: 61 ng/mL — ABNORMAL HIGH (ref 0.0–4.0)

## 2019-03-21 MED ORDER — TAMSULOSIN HCL 0.4 MG PO CAPS: 0.4000 mg | ORAL_CAPSULE | Freq: Every day | ORAL | 3 refills | Status: DC

## 2019-03-21 NOTE — Assessment & Plan Note (Addendum)
NO SI or HI but not well controlled. Has not responded well to SSRIs in the past according to patient.  - Buspar 10mg  BID to start for depression and anxiety given he has history of poor response to first line SSRIs - Seroquel to help also with anxiety and also with sleep. - F/u in 2 weeks in my clinic. - Given several handouts for community resources that will help find him both Psychiatry and Counseling services that take Medicaid. Stressed again that he needs to be seen by Psych.

## 2019-03-21 NOTE — Assessment & Plan Note (Signed)
Patient continues to have housing issues which is significantly impacting his anxiety and depression - Referral made to Neoma Laming here in [facility] for house needs made via CCM.

## 2019-03-21 NOTE — Assessment & Plan Note (Addendum)
PSA significantly elevated at >60. Still more likely to be BPH than prostate cancer but patient will need further evaluation from Urology. - Refilling Flomax today - Referral to Urology

## 2019-04-03 ENCOUNTER — Telehealth: Payer: Medicaid Other

## 2019-04-03 ENCOUNTER — Ambulatory Visit: Payer: Self-pay | Admitting: Licensed Clinical Social Worker

## 2019-04-03 ENCOUNTER — Other Ambulatory Visit: Payer: Self-pay

## 2019-04-03 ENCOUNTER — Ambulatory Visit: Payer: Medicaid Other | Admitting: Licensed Clinical Social Worker

## 2019-04-03 DIAGNOSIS — Z599 Problem related to housing and economic circumstances, unspecified: Secondary | ICD-10-CM

## 2019-04-03 DIAGNOSIS — F330 Major depressive disorder, recurrent, mild: Secondary | ICD-10-CM

## 2019-04-03 DIAGNOSIS — F411 Generalized anxiety disorder: Secondary | ICD-10-CM

## 2019-04-03 NOTE — Chronic Care Management (AMB) (Signed)
  Social Work Care Management  Unsuccessful Outreach  04/03/2019 Name: Jacob [name] MRN: [id number] DOB: [birth year]  Referred by: Nuala Alpha, DO Reason for referral : Care Coordination ( F/U call) An unsuccessful telephone outreach was attempted today. The patient was referred to the case management team by for assistance with care management and care coordination.  Outreach to patient to see if he connected to resources provided by PCP and to assess for SDOH.  Follow Up Plan: A HIPPA compliant phone message was left for the patient providing contact information and requesting a return call.  The care management team will reach out to the patient again over the next 2 days.   Casimer Lanius, LCSW Clinical Social Worker [place] / [place]   [phone] 2:29 PM

## 2019-04-03 NOTE — Patient Instructions (Signed)
Licensed Clinical Social Worker Visit Information Jacob [name]  it was nice speaking with you. Please call me directly if you have questions [phone] Goals we discussed today:  Goals Addressed            This Visit's Progress   . Connect with mental health provider for medication to manage and reduce symptoms       Current Barriers:  . Chronic Mental Health needs related to anxiety and depression  . Limited social support, lack of motivation and Follow through . States medication is not working for him . Does not want to go to [facility] as the wait is too long and is not open to therapy Clinical Social Work Goal(s):  Marland Kitchen Over the next 30 days, patient will work with LCSW weekly by telephone to reduce or manage symptoms related to depression and anxiety . Over the next 10 days, patient will follow up with [facility] as directed by LCSW to schedule psychiatry appointment Interventions: . Patient interviewed and assessed needs as well as discussed community options . Suicidal Ideation/Homicidal Ideation: No . Assessed previous mental health treatment  . Provided patient with information about relaxed breathing . Referred patient to [facility] and [facility] for long term therapy/counseling, ( patient declined ongoing therapy) Patient Self Care Activities:  . Performs ADL's independently . Calls provider office for new concerns or questions Patient Coping Strengths:  . Able to Communicate Effectively . Has safety plan ( discussed this with patient) Patient Self Care Deficits:  . Does not adhere to provider recommendations re: psychiatry  . Lacks social connections  Initial goal documentation     . I need help finding housing       Current Barriers:  . Limited support, knowledge or education related to Anxiety, Depression, and housing needs . Knowledge Deficits related to caring for self with IADL's . Financial constraints related to having enough money for  housing . Father recently passed away and he has to move out of the home . Two cats limits housing options . Mental Health Concerns  Clinical Goal(s)  Over the next 30 days, patient will:  . Work with LCSW to address care coordination needs  . Call care management team with questions or concerns . Verbalize basic understanding of patient centered plan of care established today Interventions :  . Assessed patient's understanding, education and care needs  . Provided basic education to patient about housing options ( ALF, group home, subsidized housing) . Collaborated with appropriate clinical care team members regarding patient needs . Discussed plans with patient for ongoing care management follow up and provided patient with direct contact information for care management team . Care Guide referral for housing options Patient Self Care Activities:  . Performs ADL's independently Patient Self Care Deficits:  . Lacks social connections  Initial goal documentation        Materials provided:  Jacob [name] was given information about Care Management services today including:  1. Care Management services include personalized support from designated clinical staff supervised by his physician, including individualized plan of care and coordination with other care providers 2. 24/7 contact [phone] for assistance for urgent and routine care needs. 3. Care Management services at any time by phone call to the office staff.  Patient agreed to services and verbal consent obtained.   The patient verbalized understanding of instructions provided today and declined a print copy of patient instruction materials.  Follow up plan:  Patient will contact PCP about  medication concerns. LCSW will Follow up in 1 week  Maurine Cane, LCSW

## 2019-04-03 NOTE — Chronic Care Management (AMB) (Signed)
Clinical Social Work Care Management  Initial Visit Note  04/03/2019 Name: Jacob [name] MRN: [id number] DOB: December 11, 1955  Assessment: Jacob [name] is a 63 y.o. year old male who sees Jacob [name], Jacob [place], Jacob [name] for primary care. The care management team was consulted for assistance with care management and care coordination needs related to [place] and housing resources.    Review of patient status, including review of consultants reports, relevant laboratory and other test results, and collaboration with appropriate care team members and the patient's provider was performed as part of comprehensive patient evaluation and provision of care management services.    SDOH (Social Determinants of Health) screening performed today: Housing  Depression  . See Care Plan for related entries.    Outpatient Encounter Medications as of 04/03/2019  Medication Sig   amitriptyline (ELAVIL) 10 MG tablet Take 1 tablet (10 mg total) by mouth at bedtime.   amLODipine (NORVASC) 10 MG tablet Take 1 tablet (10 mg total) by mouth daily before breakfast.   aspirin 81 MG EC tablet TAKE 1 TABLET (81 MG TOTAL) BY MOUTH DAILY.   busPIRone (BUSPAR) 10 MG tablet Take 1 tablet (10 mg total) by mouth 2 (two) times daily.   HYDROcodone-acetaminophen (NORCO/VICODIN) 5-325 MG tablet Take 1 tablet by mouth every 6 (six) hours as needed for moderate pain.   hydrOXYzine (ATARAX/VISTARIL) 10 MG tablet Take 1 tablet (10 mg total) by mouth 3 (three) times daily as needed.   hydrOXYzine (ATARAX/VISTARIL) 25 MG tablet Take 1 tablet (25 mg total) by mouth every 6 (six) hours.   ibuprofen (ADVIL) 800 MG tablet Take 1 tablet (800 mg total) by mouth every 8 (eight) hours as needed.   Melatonin 5 MG CAPS Take 1 capsule (5 mg total) by mouth at bedtime.   metFORMIN (GLUCOPHAGE) 1000 MG tablet Take 1 tablet (1,000 mg total) by mouth 2 (two) times daily with a meal.   mirtazapine (REMERON) 15 MG tablet Take 1 tablet  (15 mg total) by mouth at bedtime.   rosuvastatin (CRESTOR) 20 MG tablet Take 1 tablet (20 mg total) by mouth daily.   tamsulosin (FLOMAX) 0.4 MG CAPS capsule Take 1 capsule (0.4 mg total) by mouth daily.   No facility-administered encounter medications on file as of 04/03/2019.    Goals Addressed            This Visit's Progress    Connect with mental health provider for medication to manage and reduce symptoms       Current Barriers:   Chronic Mental Health needs related to anxiety and depression   Limited social support, lack of motivation and Follow through  States medication is not working for him  Does not want to go to [place] as the wait is too long and is not open to therapy Clinical Social Work Goal(s):   Over the next 30 days, patient will work with [facility] weekly by telephone to reduce or manage symptoms related to depression and anxiety  Over the next 10 days, patient will follow up with [facility] as directed by LCSW to schedule psychiatry appointment Interventions:  Patient interviewed and assessed needs as well as discussed community options  Suicidal Ideation/Homicidal Ideation: No  Assessed previous mental health treatment   Provided patient with information about relaxed breathing  Referred patient to [facility] and [facility] for long term therapy/counseling, ( patient declined ongoing therapy) Patient Self Care Activities:   Performs ADL's independently  Calls provider office for new  concerns or questions Patient Coping Strengths:   Able to Communicate Effectively  Has safety plan ( discussed this with patient) Patient Self Care Deficits:   Does not adhere to provider recommendations re: psychiatry   Lacks social connections  Initial goal documentation      I need help finding housing       Current Barriers:   Limited support, knowledge or education related to Anxiety, Depression, and housing needs  Knowledge Deficits  related to caring for self with IADL's  Financial constraints related to having enough money for housing  Father recently passed away and he has to move out of the home  Two cats limits housing options  Mental Health Concerns  Clinical Goal(s)  Over the next 30 days, patient will:   Work with LCSW to address care coordination needs   Call care management team with questions or concerns  Verbalize basic understanding of patient centered plan of care established today Interventions :   Assessed patient's understanding, education and care needs   Provided basic education to patient about housing options ( ALF, group home, subsidized housing)  Collaborated with appropriate clinical care team members regarding patient needs  Discussed plans with patient for ongoing care management follow up and provided patient with direct contact information for care management team  Care Guide referral for housing options Patient Self Care Activities:   Performs ADL's independently Patient Self Care Deficits:   Lacks social connections  Initial goal documentation      Follow up plan:  Telephone follow up appointment with LCSW scheduled for:04/10/2019 Follow up with provider re: concerns with medication   Jacob [name] was given information about Care Management services today including:  1. Care Management services include personalized support from designated clinical staff supervised by a physician, including individualized plan of care and coordination with other care providers 2. 24/7 contact phone numbers for assistance for urgent and routine care needs. 3. The patient may stop Care Management services at any time (effective at the end of the month) by phone call to the office staff.  Patient agreed to services and verbal consent obtained. Jacob Lanius, LCSW Clinical Social Worker [place] / [place]   [phone] 4:47 PM

## 2019-04-04 ENCOUNTER — Telehealth: Payer: Self-pay

## 2019-04-05 ENCOUNTER — Telehealth: Payer: Medicaid Other

## 2019-04-10 ENCOUNTER — Ambulatory Visit: Payer: Medicaid Other | Admitting: Licensed Clinical Social Worker

## 2019-04-10 ENCOUNTER — Other Ambulatory Visit: Payer: Self-pay

## 2019-04-10 ENCOUNTER — Ambulatory Visit: Payer: Self-pay | Admitting: Licensed Clinical Social Worker

## 2019-04-10 ENCOUNTER — Telehealth: Payer: Medicaid Other

## 2019-04-10 DIAGNOSIS — Z599 Problem related to housing and economic circumstances, unspecified: Secondary | ICD-10-CM

## 2019-04-10 DIAGNOSIS — F411 Generalized anxiety disorder: Secondary | ICD-10-CM

## 2019-04-10 DIAGNOSIS — F339 Major depressive disorder, recurrent, unspecified: Secondary | ICD-10-CM

## 2019-04-10 NOTE — Patient Instructions (Signed)
Licensed Clinical Social Worker Visit Information Jacob [name]  it was nice speaking with you. Please call me directly if you have questions [phone] Goals we discussed today:  Goals Addressed            This Visit's Progress   . Connect with mental health provider for medication to manage and reduce symptoms   Not on track    Current Barriers:  . Chronic Mental Health needs related to anxiety and depression  . Limited social support, lack of follow through . States medication is not working for him . Will not go to [place] states the wait is too long  . Is not interested in counseling  . Has not contact [facility] for psychiatry needs  Clinical Social Work Goal(s):  Marland Kitchen Over the next 30 days, patient will work with LCSW weekly by telephone to reduce or manage symptoms related to depression and anxiety until connected to psychiatry.  . Over the next 10 days, patient will follow up with [facility] [phone] as directed by LCSW to schedule psychiatry appointment Interventions: . Assessed needs and understanding for meeting mental health needs  . Discussed community community options . No Suicidal Ideation/Homicidal Ideation present . Reviewed relaxatio and coping skills . Providing information [facility] Patient Self Care Activities & Deficits: :  . Performs ADL's independently . Has a car for transportation . Does not adhere to provider recommendations re: psychiatry  . Has limited minutes on cell phone  Patient Coping Strengths:  . Able to Communicate Effectively . Has safety plan ( discussed this with patient) Please see past updates related to this goal by clicking on the "Past Updates" button in the selected goal     . I need help finding housing   Not on track    Current Barriers:  . Limited support, knowledge or education related to Anxiety, Depression, and housing needs . Knowledge Deficits related to caring for self with IADL's . Financial constraints  related to having enough money for housing . Father recently passed away and he has to move out of the home . Two cats limits housing options . Mental Health Concerns  Clinical Goal(s)  Over the next 30 days, patient will:  . Work with LCSW to address care coordination needs for housing . Call [place] to inquire application for 24 and older new apartment [phone]) . Call LCSW if he needs assistance with application process . Explore all options discussed for temp housing  . Verbalize basic understanding of patient centered plan of care established today Interventions :  . Assessed patient's understanding, education and care needs for housing . Provided basic education to patient about housing open waitlist for apartments with [employer]. Jacob [name] with [facility] to obtain information  Patient Self Care Activities & Deficits:  . Attends all scheduled provider appointments . Has limited minutes on his cell phone to make or receive calls . Limited support system  Please see past updates related to this goal by clicking on the "Past Updates" button in the selected goal       Materials provided:  Jacob [name] was given information about Care Management services today including:  1. Care Management services include personalized support from designated clinical staff supervised by his physician, including individualized plan of care and coordination with other care providers 2. 24/7 contact [phone] for assistance for urgent and routine care needs. 3. Care Management services at any time by phone call to the office staff.  The patient verbalized understanding of instructions provided today and declined a print copy of patient instruction materials.  Follow up plan:  Appointment scheduled for SW follow up with client by phone on: 04/17/19  Jacob Cane, LCSW

## 2019-04-10 NOTE — Chronic Care Management (AMB) (Signed)
  Social Work Care Management  Unsuccessful Phone Outreach    04/10/2019 Name: Jacob [name] MRN: [id number] DOB: [birth year]  Referred by: Nuala Alpha, DO Reason for Follow up : Care Coordination (F/U call for mental health support) Several  unsuccessful outreach attempts were made today for follow up appointment that was scheduled with LCSW. A HIPPA compliant phone message was left for the patient providing contact information and requesting a return call.  Follow Up Plan: Telephone follow up appointment for another attempt to contact patient is scheduled for 04/17/19  Casimer Lanius, [place] Worker [place] / [place]   [phone] 3:03 PM

## 2019-04-10 NOTE — Chronic Care Management (AMB) (Signed)
Social Work Care Management  Follow Up Note  04/10/2019 Name: Jacob [name] MRN: [id number] DOB: June 16, 1955  Referred by: Nuala Alpha, DO Reason for referral : Care Coordination (mental health provider and housing)  CHIKEZIE WITTMER is a 63 y.o. year old male who is a primary care patient of Nuala Alpha, DO.  Reason for follow-up: phone encounter with patient today for ongoing assessment, care coordination and brief interventions to assist with managing symptom of anxiety, housing resources and connecting with psychiatry. Assessment: Patient continues to experience stress with managing his housing situation.  [name] making minimal progress towards goal.  SDOH (Social Determinants of Health) screening performed today with not new challenges identified. See Care Plan for related entries.   Review of patient status, including review of consultants reports, relevant laboratory and other test results, and collaboration with appropriate care team members and the patient's provider was performed as part of comprehensive patient evaluation and provision of chronic care management services.   Goals Addressed            This Visit's Progress   . Connect with mental health provider for medication to manage and reduce symptoms   Not on track    Current Barriers:  . Chronic Mental Health needs related to anxiety and depression  . Limited social support, lack of follow through . States medication is not working for him . Will not go to [place] states the wait is too long  . Is not interested in counseling  . Has not contact [place] for psychiatry needs  Clinical Social Work Goal(s):  Marland Kitchen Over the next 30 days, patient will work with LCSW weekly by telephone to reduce or manage symptoms related to depression and anxiety until connected to psychiatry.  . Over the next 10 days, patient will follow up with [facility] [phone] as directed by LCSW to schedule psychiatry appointment  Interventions: . Assessed needs and understanding for meeting mental health needs  . Discussed community community options . No Suicidal Ideation/Homicidal Ideation present . Reviewed relaxatio and coping skills . Providing information [facility] Patient Self Care Activities & Deficits: :  . Performs ADL's independently . Has a car for transportation . Does not adhere to provider recommendations re: psychiatry  . Has limited minutes on cell phone  Patient Coping Strengths:  . Able to Communicate Effectively . Has safety plan ( discussed this with patient) Please see past updates related to this goal by clicking on the "Past Updates" button in the selected goal     . I need help finding housing   Not on track    Current Barriers:  . Limited support, knowledge or education related to Anxiety, Depression, and housing needs . Knowledge Deficits related to caring for self with IADL's . Financial constraints related to having enough money for housing . Father recently passed away and he has to move out of the home . Two cats limits housing options . Mental Health Concerns  Clinical Goal(s)  Over the next 30 days, patient will:  . Work with LCSW to address care coordination needs for housing . Call [place] to inquire application for 12 and older new apartment [phone]) . Call LCSW if he needs assistance with application process . Explore all options discussed for temp housing  . Verbalize basic understanding of patient centered plan of care established today Interventions :  . Assessed patient's understanding, education and care needs for housing . Provided basic education to patient  about housing open waitlist for apartments with [employer]. Nash Dimmer with [facility] to obtain information  Patient Self Care Activities & Deficits:  . Attends all scheduled provider appointments . Has limited minutes on his cell phone to make or receive calls  . Limited support system Please see past updates related to this goal by clicking on the "Past Updates" button in the selected goal      Plan: 1. Telephone follow up appointment with LCSW scheduled for: 04/17/19 2. The patient will call [place] [facility] [place], [place] / [place]   [phone] 4:18 PM

## 2019-04-10 NOTE — Telephone Encounter (Signed)
Opened chart in error. Jacob [name]

## 2019-04-16 ENCOUNTER — Ambulatory Visit: Payer: Medicaid Other | Admitting: Licensed Clinical Social Worker

## 2019-04-16 DIAGNOSIS — Z7189 Other specified counseling: Secondary | ICD-10-CM

## 2019-04-16 DIAGNOSIS — F411 Generalized anxiety disorder: Secondary | ICD-10-CM

## 2019-04-16 DIAGNOSIS — Z599 Problem related to housing and economic circumstances, unspecified: Secondary | ICD-10-CM

## 2019-04-16 NOTE — Chronic Care Management (AMB) (Signed)
Social Work Care Management  Follow Up Note 04/16/2019 Name: Jacob [name] MRN: [id number] DOB: April 10, 1956  Referred by: Nuala Alpha, DO Reason for referral : Care Coordination (mental health needs and housing )  Jacob [name] is a 63 y.o. year old male who is a primary care patient of Nuala Alpha, DO.  Reason for follow-up: phone encounter with patient today for ongoing assessment and brief interventions to assist with managing symptoms of anxiety and care coordination needs related to housing and connecting to psychiatry.   Assessment: Patient continues to experience symptoms of feeling anxious which are exacerbated by his housing concerns.  SDOH (Social Determinants of Health) screening performed today/ See Care Plan for related entries.   Review of patient status, including review of consultants reports, relevant laboratory and other test results, and collaboration with appropriate care team members and the patient's provider was performed as part of comprehensive patient evaluation and provision of chronic care management services.   Goals Addressed            This Visit's Progress   . Connect with mental health provider for medication to manage and reduce symptoms   On track    Current Barriers:  . Unmet Chronic Mental Health needs related to anxiety and depression for patient with HTN and DMII. Marland Kitchen Patient needs support, education, and care coordination needs for mental health support and counseling resources . Lack of follow through with contact [facility] for psychiatry needs  . Feeling anxious and overwhelmed with housing concerns  Clinical Social Work Goal(s):  Marland Kitchen Over the next 30 days, patient will work with LCSW bi-weekly by telephone to reduce or manage symptoms related to depression and anxiety until connected to psychiatry.  . Over the next 10 days, patient will follow up with [facility] [phone] psychiatry appointment scheduled 12/19/202 at 1:30  Interventions: . Assessed needs and understanding for mental health needs  . No Suicidal Ideation/Homicidal Ideation present . Provided basic mental health support and interventions (relaxation techniques, coping skills) . [facility] with patient on the phone so that he could make the appointment  . Patient is now willing to go to counseling support will get therapist at psychiatry appointment  . Motivational Interviewing as well as solution focused interventions completed  Patient Self Care Activities & Deficits: :  . Performs ADL's independently . Has a car for transportation . Does not adhere to provider recommendations re: psychiatry  . Has limited minutes on cell phone  Patient Coping Strengths:  . Able to Communicate Effectively . Has safety plan ( discussed this with patient)  Please see past updates related to this goal by clicking on the "Past Updates" button in the selected goal     . I need help finding housing   Not on track    Current Barriers:  . Unmet housing need for patient with HTN, DMII, Anxiety, and Depression . Patient needs support, education, and care coordination needs related to Locating new housing, is no longer able to live at current resident. . Knowledge Deficits related to caring for self with IADL's . Financial constraints related to having enough money for housing . Father recently passed away and he has to move out of the home . Two cats limits housing options . Mental Health Concerns  Clinical Goal(s)  Over the next 30 days, patient will:  . Work with LCSW to address care coordination needs for housing . Call [place] to inquire application for 55 and  older new apartment [phone]) . Patient will call list of resource mailed to him from care guide . Allow sister to support him with locating housing and go visit apartments as well as place applications Interventions :  . Assessed patient's understanding, care needs  and what he has accomplished for housing . Provided basic education to patient about housing options  Patient Self Care Activities & Deficits:  . Attends all scheduled provider appointments . Has limited minutes on his cell phone to make or receive calls . Limited support system  Please see past updates related to this goal by clicking on the "Past Updates" button in the selected goal      Plan:  LCSW will F/U with patient over the next two weeks  Casimer Lanius, [place] / [place]   [phone] 11:51 AM

## 2019-04-16 NOTE — Patient Instructions (Signed)
Licensed Clinical Social Worker Visit Information Mr. Delrosario  it was nice speaking with you. Please call me directly if you have questions [phone] Goals we discussed today:  Goals Addressed            This Visit's Progress   . Connect with mental health provider for medication to manage and reduce symptoms   On track    Current Barriers:  . Unmet Chronic Mental Health needs related to anxiety and depression for patient with HTN and DMII. Marland Kitchen Patient needs support, education, and care coordination needs for mental health support and counseling resources . Lack of follow through with contact [facility] for psychiatry needs  . Feeling anxious and overwhelmed with housing concerns  Clinical Social Work Goal(s):  Marland Kitchen Over the next 30 days, patient will work with LCSW bi-weekly by telephone to reduce or manage symptoms related to depression and anxiety until connected to psychiatry.  . Over the next 10 days, patient will follow up with [facility] [phone] psychiatry appointment scheduled 12/19/202 at 1:30 Interventions: . Assessed needs and understanding for mental health needs  . No Suicidal Ideation/Homicidal Ideation present . Provided basic mental health support and interventions (relaxation techniques, coping skills) . [facility] with patient on the phone so that he could make the appointment  . Patient is now willing to go to counseling support will get therapist at psychiatry appointment  . Motivational Interviewing as well as solution focused interventions completed  Patient Self Care Activities & Deficits: :  . Performs ADL's independently . Has a car for transportation . Does not adhere to provider recommendations re: psychiatry  . Has limited minutes on cell phone  Patient Coping Strengths:  . Able to Communicate Effectively . Has safety plan ( discussed this with patient)  Please see past updates related to this goal by clicking on the "Past Updates"  button in the selected goal      . I need help finding housing   Not on track    Current Barriers:  . Unmet housing need for patient with HTN, DMII, Anxiety, and Depression . Patient needs support, education, and care coordination needs related to Locating new housing, is no longer able to live at current resident. . Knowledge Deficits related to caring for self with IADL's . Financial constraints related to having enough money for housing . Father recently passed away and he has to move out of the home . Two cats limits housing options . Mental Health Concerns  Clinical Goal(s)  Over the next 30 days, patient will:  . Work with LCSW to address care coordination needs for housing . Call [place] to inquire application for 81 and older new apartment [phone]) . Patient will call list of resource mailed to him from care guide . Allow sister to support him with locating housing and go visit apartments as well as place applications Interventions :  . Assessed patient's understanding, care needs and what he has accomplished for housing . Provided basic education to patient about housing options  Patient Self Care Activities & Deficits:  . Attends all scheduled provider appointments . Has limited minutes on his cell phone to make or receive calls . Limited support system  Please see past updates related to this goal by clicking on the "Past Updates" button in the selected goal         Materials provided: Mr. Rallo was given information about Care Management services today including:  1. Care Management  services include personalized support from designated clinical staff supervised by his physician, including individualized plan of care and coordination with other care providers 2. 24/7 contact [phone] for assistance for urgent and routine care needs. 3. Care Management services at any time by phone call to the office staff. The patient verbalized understanding of  instructions provided today and declined a print copy of patient instruction materials.  Follow up plan:  SW will follow up with patient by phone over the next 2 weeks  Maurine Cane, LCSW

## 2019-04-17 ENCOUNTER — Telehealth: Payer: Self-pay

## 2019-04-17 ENCOUNTER — Telehealth: Payer: Medicaid Other

## 2019-04-17 NOTE — Telephone Encounter (Signed)
Copied from [place] [id number]. Topic: Referral - Status >> Apr 17, 2019  [id number] PM Simone Curia D wrote: [phone] Spoke with patient about [employer], Sport and exercise psychologist.  Patient stated that he would call them tomorrow.  Ambrose Mantle [phone]

## 2019-04-17 NOTE — Telephone Encounter (Signed)
Copied from [place] [phone]. Topic: Referral - Status >> Apr 17, 2019  [id number] PM Simone Curia D wrote: [phone] Left message on voicemail for patient to return my call regarding [place] housing resource. Ambrose Mantle [phone]

## 2019-04-20 ENCOUNTER — Other Ambulatory Visit: Payer: Self-pay

## 2019-04-20 ENCOUNTER — Emergency Department (HOSPITAL_COMMUNITY)
Admission: EM | Admit: 2019-04-20 | Discharge: 2019-04-20 | Disposition: A | Discharge status: Home / Self Care | Payer: Medicaid Other | Attending: Emergency Medicine | Admitting: Emergency Medicine

## 2019-04-20 DIAGNOSIS — Z7984 Long term (current) use of oral hypoglycemic drugs: Secondary | ICD-10-CM | POA: Insufficient documentation

## 2019-04-20 DIAGNOSIS — I10 Essential (primary) hypertension: Secondary | ICD-10-CM | POA: Diagnosis not present

## 2019-04-20 DIAGNOSIS — Z79899 Other long term (current) drug therapy: Secondary | ICD-10-CM | POA: Diagnosis not present

## 2019-04-20 DIAGNOSIS — F419 Anxiety disorder, unspecified: Secondary | ICD-10-CM | POA: Diagnosis present

## 2019-04-20 DIAGNOSIS — F411 Generalized anxiety disorder: Secondary | ICD-10-CM

## 2019-04-20 DIAGNOSIS — Z7982 Long term (current) use of aspirin: Secondary | ICD-10-CM | POA: Insufficient documentation

## 2019-04-20 DIAGNOSIS — E119 Type 2 diabetes mellitus without complications: Secondary | ICD-10-CM | POA: Diagnosis not present

## 2019-04-20 DIAGNOSIS — F331 Major depressive disorder, recurrent, moderate: Secondary | ICD-10-CM | POA: Diagnosis not present

## 2019-04-20 LAB — RAPID URINE DRUG SCREEN, HOSP PERFORMED
Amphetamines: NOT DETECTED
Barbiturates: NOT DETECTED
Benzodiazepines: NOT DETECTED
Cocaine: NOT DETECTED
Opiates: NOT DETECTED
Tetrahydrocannabinol: NOT DETECTED

## 2019-04-20 MED ORDER — AMLODIPINE BESYLATE 5 MG PO TABS
10.0000 mg | ORAL_TABLET | Freq: Once | ORAL | Status: DC
  Filled 2019-04-20: qty 2

## 2019-04-20 MED ORDER — TRAZODONE HCL 50 MG PO TABS: 50.0000 mg | ORAL_TABLET | Freq: Every day | ORAL | 0 refills | Status: DC

## 2019-04-20 NOTE — ED Notes (Signed)
Dr. Plunkett at bedside.  

## 2019-04-20 NOTE — BH Assessment (Signed)
Assessment Note  Jacob [name] is an 63 y.o. male who presented to [facility] because he is losing current housing.  He reports being told 6 months ago by Step Mother that she will be selling the family home.  Patient reports procrastinating and  a week ago she left a note on his door stating if he does not leave by the end of this month she will have the [facility] evict him.  Patient reports since that time only sleeping 2 hours per night and having to sleep during the day to make up the lost sleep.  He also reports being severely anxious because he has never lived independently and does not possess life skills to live on his own.  Patient presented orientated x4, mood "anxious ad depressed", affect congruent with mood.  Patient denied SI, HI, and AVH.  He reports Father died 3 months ago and he continues to experience grief along with feelings of worthlessness, daily crying episodes, not keeping personal items organized, being forgetful, and no desire to do anything.  He also reports experiencing high levels of anxiety with shaking, loss of balance, and some panic attacks.  Patient reports having 2 adult Sisters with families who are unable to take him in and the Step Mother is moving to another city.  Patient reports working with Education officer, museum on section 8 housing, however he reports being afraid to live in that type housing because of high crime level.  Patient reports being prescribed a sleep aid, however refill ran out a week ago.  Patient denied having an outpatient Psychiatrist or Therapist.  Patient reports only psychiatric hospitalization was in 10-2018 at [facility] for depression.  Patient denied any substance use.    Per Letitia Libra, NP:  Patient does not meet inpatient criteria, Social Worker consult can be placed.  Dr. Maryan Rued was provided with Patient's psyche disposition .            Diagnosis:  Major Depressive Disorder, recurrent, moderate  Past Medical History:  Past Medical History:  Diagnosis Date   . Anxiety   . Arthritis    "hurt qwhere in my bones; especially early in the morning" (06/08/2016)  . Chronic cough 08-10-11   more than 6 yrs from sinus drainage  . 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   . Complication of anesthesia    prolonged emergence  . Depression    depression(due to childhood issues)  . 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 Erectile dysfunction 08/23/2013  . Gout, unspecified 06/29/2006   Qualifier: Diagnosis of  By: Herma Ard    . High cholesterol   . History of gout   . HYPERLIPIDEMIA 02/19/2007   Qualifier: Diagnosis of  By: Darylene Price MD, Elta Guadeloupe    . Hypertension 08-10-11   tx. meds  . Increased urinary frequency   . Kidney stones 08-10-11   once in past, none recent  . Methylprednisolone-induced hypoxia 08/23/2011  . Neuromuscular disorder ([place]) 08-10-11   "burning/tingling sensation In feet"  . Obesity   . Obesity, Class II, BMI 35-39.9, with comorbidity 06/29/2006  . OSA (obstructive sleep apnea)    "couldn't deal w/CPAP" (06/08/2016)  . Preoperative clearance   . RUQ pain 06/09/2016  . Shortness of breath 08-10-11   with  increased activity only  . Syncope and collapse 06/09/2016  . Syncope and collapse 06/09/2016  . Type II diabetes mellitus ([place])   . Umbilical hernia     Past Surgical History:  Procedure Laterality Date  . CATARACT EXTRACTION W/ INTRAOCULAR LENS  IMPLANT, BILATERAL Bilateral 08/10/2011 - ~ 2014   "right - left"  . CHOLECYSTECTOMY N/A 06/10/2016   Procedure: LAPAROSCOPIC CHOLECYSTECTOMY WITH INTRAOPERATIVE CHOLANGIOGRAM;  Surgeon: Erroll Luna, MD;  Location: [place];  Service: General;  Laterality: N/A;  . COLONOSCOPY W/ BIOPSIES AND  POLYPECTOMY  2013  . HERNIA REPAIR    . LIPOMA EXCISION Right 10/18/2018   Procedure: EXCISION RIGHT BACK  LIPOMA X 2;  Surgeon: Erroll Luna, MD;  Location: [place];  Service: General;  Laterality: Right;  . VENTRAL HERNIA REPAIR  08/22/2011   Procedure: HERNIA REPAIR VENTRAL ADULT;  Surgeon: Adin Hector, MD;  Location: [facility];  Service: General;  Laterality: N/A;  incarcerated    Family History:  Family History  Problem Relation Age of Onset  . Hyperlipidemia Mother   . Diabetes Mother   . Heart disease Father   . Hyperlipidemia Father   . Colon cancer Neg Hx     Social History:  reports that he has never smoked. He has never used smokeless tobacco. He reports that he does not drink alcohol or use drugs.  Additional Social History:     CIWA: CIWA-Ar BP: (!) 148/92 Pulse Rate: 80 COWS:    Allergies:  Allergies  Allergen Reactions  . Methylprednisolone Sodium Succ     Respiratory failure    Home Medications: (Not in a hospital admission)   OB/GYN Status:  No LMP for male patient.  General Assessment Data Location of Assessment: [facility] TTS Assessment: In system Is this a Tele or Face-to-Face Assessment?: Tele Assessment Is this an Initial Assessment or a Re-assessment for this encounter?: Initial Assessment Patient Accompanied by:: N/A Language Other than English: No What gender do you identify as?: Male Marital status: Single Living Arrangements: Other relatives(with Step Mother) Can pt return to current living arrangement?: Yes Admission Status: Voluntary Is patient capable of signing voluntary admission?: Yes Referral Source: Self/Family/Friend Insurance type: Medicaid  Medical Screening Exam ([place]) Medical Exam completed: Yes  Crisis Care Plan Living Arrangements: Other relatives(with Step Mother) Legal Guardian: Other:(Self) Name of Psychiatrist: None Name of Therapist: None  Education Status Is patient currently  in school?: No Is the patient employed, unemployed or receiving disability?: Receiving disability income  Risk to self with the past 6 months Suicidal Ideation: No Has patient been a risk to self within the past 6 months prior to admission? : No Suicidal Intent: No Has patient had any suicidal intent within the past 6 months prior to admission? : No Is patient at risk for suicide?: No Suicidal Plan?: No Has patient had any suicidal plan within the past 6 months prior to admission? : No Access to Means: No What has been your use of drugs/alcohol within the last 12 months?: None Previous Attempts/Gestures: No How many times?: 0 Other Self Harm Risks: None Triggers for Past Attempts: None known Intentional Self Injurious Behavior: None Family Suicide History: Unknown Recent stressful life event(s): Other (Comment)(has 2 weeksa to find housing or he will be evicted) Persecutory voices/beliefs?: No Depression: Yes Depression Symptoms: Feeling worthless/self pity, Loss of interest in usual pleasures, Tearfulness Substance abuse history and/or treatment for substance abuse?: No Suicide prevention information given to non-admitted patients:  Not applicable  Risk to Others within the past 6 months Homicidal Ideation: No Does patient have any lifetime risk of violence toward others beyond the six months prior to admission? : No Thoughts of Harm to Others: No Current Homicidal Intent: No Current Homicidal Plan: No Access to Homicidal Means: No History of harm to others?: No Assessment of Violence: None Noted Does patient have access to weapons?: Yes (Comment)(Old shot gun that doesn't work) Pharmacist, community Pending?: No Does patient have a court date: No Is patient on probation?: No  Psychosis Hallucinations: None noted(Patient denied ) Delusions: None noted  Mental Status Report Appearance/Hygiene: In scrubs Eye Contact: Good Motor Activity: Restlessness Speech:  Logical/coherent Level of Consciousness: Alert Mood: Anxious, Depressed Affect: Anxious, Depressed, Sad Anxiety Level: Moderate Thought Processes: Coherent, Irrelevant Judgement: Unimpaired Orientation: Person, Place, Time, Situation Obsessive Compulsive Thoughts/Behaviors: None  Cognitive Functioning Concentration: Good Memory: Recent Intact, Remote Intact Is patient IDD: No Insight: Good Impulse Control: Good Appetite: Fair Have you had any weight changes? : No Change Sleep: Decreased Total Hours of Sleep: 3 Vegetative Symptoms: Decreased grooming  ADLScreening [facility] Assessment Services) Patient's cognitive ability adequate to safely complete daily activities?: Yes Patient able to express need for assistance with ADLs?: No Independently performs ADLs?: Yes (appropriate for developmental age)  Prior Inpatient Therapy Prior Inpatient Therapy: Yes Prior Therapy Dates: 10-2018 Prior Therapy Facilty/Provider(s): [facility] Reason for Treatment: Depression  Prior Outpatient Therapy Prior Outpatient Therapy: No Does patient have an ACCT team?: No Does patient have Intensive In-House Services?  : No Does patient have [facility] services? : No Does patient have P4CC services?: No  ADL Screening (condition at time of admission) Patient's cognitive ability adequate to safely complete daily activities?: Yes Is the patient deaf or have difficulty hearing?: No Does the patient have difficulty seeing, even when wearing glasses/contacts?: No Does the patient have difficulty concentrating, remembering, or making decisions?: No Patient able to express need for assistance with ADLs?: No Does the patient have difficulty dressing or bathing?: No Independently performs ADLs?: Yes (appropriate for developmental age) Does the patient have difficulty walking or climbing stairs?: No Weakness of Legs: None Weakness of Arms/Hands: None  Home Assistive Devices/Equipment Home Assistive  Devices/Equipment: None      Values / Beliefs Cultural Requests During Hospitalization: None Spiritual Requests During Hospitalization: None   Advance Directives (For Healthcare) Does Patient Have a Medical Advance Directive?: No Would patient like information on creating a medical advance directive?: No - Patient declined          Disposition:  Disposition Initial Assessment Completed for this Encounter: Yes Disposition of Patient: Discharge Patient refused recommended treatment: No Mode of transportation if patient is discharged/movement?: N/A Patient referred to: Other (Comment)(Socioal Work)  On Web designer by:   Reviewed with Physician:    Dey-Johnson,Traeson Dusza 04/20/2019 4:30 PM

## 2019-04-20 NOTE — ED Provider Notes (Signed)
[place] DEPT Provider Note   CSN: [id number] Arrival date & time: 04/20/19  1422     History Chief Complaint  Patient presents with  . Anxiety    Jacob [name] is a 63 y.o. male.  Patient is a 63 year old male with a history of anxiety, depression, hyperlipidemia, hypertension, diabetes, atypical chest pain who is presenting today with worsening anxiety. Patient has lived in the apartment under his father's home for the last 29 years of his life and his father died 3 months ago and his stepmom who he does not get along with well has informed him that he needs to be out of the house by December 31 because she is moving. Patient states he has never lived on his own before and he is extremely anxious. He doesn't know how to be alone or live on his own. He does have 2 sisters that are trying to help him find an apartment but he states he doesn't even know how to pay a water or an Warehouse manager. He has been seeing his regular doctor about this and they started him on an antidepressant and sleeping medication 3 weeks ago which he states is not helping. He continues to feel worse and is not sleeping at night, shaking all the time and feeling very nervous. His doctor recommended that he go to a psychiatrist who he did go to today and after starting to see the psychiatrist they told him he had too much going on and needed to go to the emergency room. Patient denies any SI or HI. No alcohol or drug use.  The history is provided by the patient.  Anxiety This is a chronic problem. Episode onset: worsening in the last month. The problem occurs constantly. The problem has been gradually worsening. Associated symptoms comments: Insomnia, palpitations, indigestion, feeling nervous all the time.  Hands shaking. Nothing aggravates the symptoms. Nothing relieves the symptoms. Treatments tried: pcp tried him on antidepressant and sleep meds 3 weeks aog but not helping. The  treatment provided no relief.       Past Medical History:  Diagnosis Date  . Anxiety   . Arthritis    "hurt qwhere in my bones; especially early in the morning" (06/08/2016)  . Chronic cough 08-10-11   more than 6 yrs from sinus drainage  . 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   . Complication of anesthesia    prolonged emergence  . Depression    depression(due to childhood issues)  . 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 Erectile dysfunction 08/23/2013  . Gout, unspecified 06/29/2006   Qualifier: Diagnosis of  By: Herma Ard    . High cholesterol   . History of gout   . HYPERLIPIDEMIA 02/19/2007   Qualifier: Diagnosis of  By: Darylene Price MD, Elta Guadeloupe    . Hypertension 08-10-11   tx. meds  . Increased urinary frequency   . Kidney stones 08-10-11   once in past, none recent  . Methylprednisolone-induced hypoxia 08/23/2011  . Neuromuscular disorder ([place]) 08-10-11   "burning/tingling sensation In feet"  . Obesity   . Obesity, Class II, BMI 35-39.9, with comorbidity 06/29/2006  . OSA (obstructive sleep apnea)    "couldn't deal w/CPAP" (06/08/2016)  . Preoperative clearance   .  RUQ pain 06/09/2016  . Shortness of breath 08-10-11   with increased activity only  . Syncope and collapse 06/09/2016  . Syncope and collapse 06/09/2016  . Type II diabetes mellitus ([place])   . Umbilical hernia     Patient Active Problem List   Diagnosis Date Noted  . Housing problems 03/21/2019  . Severe recurrent major depression without psychotic features ([place]) 10/04/2018  . Depression, recurrent ([place]) 09/17/2018  . Urinary frequency 02/09/2018  . Syncope and collapse 06/09/2016  . Hepatic steatosis 06/09/2016  .  Biliary colic   . Uncontrolled type 2 diabetes mellitus with diabetic neuropathy ([place])   . ACS (acute coronary syndrome) ([place]) 06/08/2016  . Erectile dysfunction 08/23/2013  . Ventral hernia with obstruction 09/09/2011  . 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 OSA (obstructive sleep apnea) 08/23/2011  . Colonic polyp 08/04/2011  . Cataract 12/01/2010  . Chest pain 07/15/2010  . Type II diabetes mellitus with neurological manifestations, uncontrolled ([place]) 10/03/2008  . HYPERLIPIDEMIA 02/19/2007  . Gout, unspecified 06/29/2006  . Obesity, Class II, BMI 35-39.9, with comorbidity 06/29/2006  . MDD (major depressive disorder), recurrent episode, mild ([place]) 06/29/2006  . Anxiety state 06/29/2006  . Essential hypertension, benign 06/29/2006    Past Surgical History:  Procedure Laterality Date  . CATARACT EXTRACTION W/ INTRAOCULAR LENS  IMPLANT, BILATERAL Bilateral 08/10/2011 - ~ 2014   "right - left"  . CHOLECYSTECTOMY N/A 06/10/2016   Procedure: LAPAROSCOPIC CHOLECYSTECTOMY WITH INTRAOPERATIVE CHOLANGIOGRAM;  Surgeon: Erroll Luna, MD;  Location: [place];  Service: General;  Laterality: N/A;  . COLONOSCOPY W/ BIOPSIES AND POLYPECTOMY  2013  . HERNIA REPAIR    . LIPOMA EXCISION Right 10/18/2018   Procedure: EXCISION RIGHT BACK  LIPOMA X 2;  Surgeon: Erroll Luna, MD;  Location: [place];  Service: General;  Laterality: Right;  . VENTRAL HERNIA REPAIR  08/22/2011   Procedure: HERNIA REPAIR VENTRAL ADULT;  Surgeon: Adin Hector, MD;  [facility];  Service: General;  Laterality: N/A;  incarcerated       Family History  Problem Relation Age of Onset  . Hyperlipidemia Mother   . Diabetes Mother   . Heart disease Father   . Hyperlipidemia Father   . Colon cancer Neg Hx     Social History   Tobacco Use  . Smoking status: Never Smoker  . Smokeless tobacco: Never Used  Substance Use Topics  . Alcohol use: No  . Drug use: No    Home Medications Prior to  Admission medications   Medication Sig Start Date End Date Taking? Authorizing Provider  amitriptyline (ELAVIL) 10 MG tablet Take 1 tablet (10 mg total) by mouth at bedtime. 01/08/19   Nuala Alpha, DO  amLODipine (NORVASC) 10 MG tablet Take 1 tablet (10 mg total) by mouth daily before breakfast. 01/24/19   Nuala Alpha, DO  aspirin 81 MG EC tablet TAKE 1 TABLET (81 MG TOTAL) BY MOUTH DAILY. 07/08/16   Ronnie Doss M, DO  busPIRone (BUSPAR) 10 MG tablet Take 1 tablet (10 mg total) by mouth 2 (two) times daily. 03/19/19   Nuala Alpha, DO  HYDROcodone-acetaminophen (NORCO/VICODIN) 5-325 MG tablet Take 1 tablet by mouth every 6 (six) hours as needed for moderate pain. 10/18/18   Cornett, Marcello Moores, MD  hydrOXYzine (ATARAX/VISTARIL) 10 MG tablet Take 1 tablet (10 mg total) by mouth 3 (three) times daily as needed. 03/13/19   Nuala Alpha, DO  hydrOXYzine (ATARAX/VISTARIL) 25 MG tablet Take 1 tablet (25 mg total) by mouth  every 6 (six) hours. 03/01/19   Recardo Evangelist, PA-C  ibuprofen (ADVIL) 800 MG tablet Take 1 tablet (800 mg total) by mouth every 8 (eight) hours as needed. 10/18/18   Cornett, Marcello Moores, MD  Melatonin 5 MG CAPS Take 1 capsule (5 mg total) by mouth at bedtime. 01/24/19   Nuala Alpha, DO  metFORMIN (GLUCOPHAGE) 1000 MG tablet Take 1 tablet (1,000 mg total) by mouth 2 (two) times daily with a meal. 01/24/19   Lockamy, Timothy, DO  mirtazapine (REMERON) 15 MG tablet Take 1 tablet (15 mg total) by mouth at bedtime. 03/19/19   Nuala Alpha, DO  rosuvastatin (CRESTOR) 20 MG tablet Take 1 tablet (20 mg total) by mouth daily. 01/24/19   Nuala Alpha, DO  tamsulosin (FLOMAX) 0.4 MG CAPS capsule Take 1 capsule (0.4 mg total) by mouth daily. 03/21/19   Nuala Alpha, DO    Allergies    Methylprednisolone sodium succ  Review of Systems   Review of Systems  All other systems reviewed and are negative.   Physical Exam Updated Vital Signs BP (!) 153/129 (BP  Location: Right Arm)   Pulse 90   Temp 98 F (36.7 C)   Resp 19   Ht 5\' 9"  (1.753 m)   Wt 102.1 kg   SpO2 99%   BMI 33.23 kg/m   Physical Exam Vitals and nursing note reviewed.  Constitutional:      General: [name] not in acute distress.    Appearance: [name] well-developed. [name] obese.  HENT:     Head: Normocephalic and atraumatic.  Eyes:     Conjunctiva/sclera: Conjunctivae normal.     Pupils: Pupils are equal, round, and reactive to light.  Cardiovascular:     Rate and Rhythm: Normal rate and regular rhythm.     Heart sounds: No murmur.  Pulmonary:     Effort: Pulmonary effort is normal. No respiratory distress.     Breath sounds: Normal breath sounds. No wheezing or rales.  Abdominal:     General: There is no distension.     Palpations: Abdomen is soft.     Tenderness: There is no abdominal tenderness. There is no guarding or rebound.  Musculoskeletal:        General: No tenderness. Normal range of motion.     Cervical back: Normal range of motion and neck supple.  Skin:    General: Skin is warm and dry.     Findings: No erythema or rash.  Neurological:     Mental Status: [name] alert and oriented to person, place, and time.  Psychiatric:        Attention and Perception: He does not perceive auditory or visual hallucinations.        Mood and Affect: Mood is anxious and depressed. Affect is blunt.        Speech: Speech normal.        Behavior: Behavior normal. Behavior is cooperative.        Thought Content: Thought content is not paranoid. Thought content does not include homicidal or suicidal ideation.     ED Results / Procedures / Treatments   Labs (all labs ordered are listed, but only abnormal results are displayed) Labs Reviewed  RAPID URINE DRUG SCREEN, HOSP PERFORMED  COMPREHENSIVE METABOLIC PANEL  ETHANOL  SALICYLATE LEVEL  ACETAMINOPHEN LEVEL  CBC    EKG None  Radiology No results found.  Procedures Procedures (including critical care  time)  Medications Ordered in ED Medications -  No data to display  ED Course  I have reviewed the triage vital signs and the nursing notes.  Pertinent labs & imaging results that were available during my care of the patient were reviewed by me and considered in my medical decision making (see chart for details).    MDM Rules/Calculators/A&P                      63 year old male presenting today with severe anxiety and difficulty sleeping. This is all situational as patient is being evicted from his home of 29 years and is never lived on his own. He has been trying medication given to him by his doctor but it is not helping. He saw a psychiatrist today who told him he had too much going on and needed to go to the emergency room. Discussed with patient that the emergency room does not usually prescribe psychiatric medications. However he does wish to talk to behavioral health. He has not taken his blood pressure medication today because he states he had too much going on and didn't remember to take it. Otherwise patient is medically clear. Will have TTS evaluate and advise.  4:27 PM Caryl Pina patient's blood pressure was elevated but repeat check was 148/92 and he has not taken his blood pressure medication today. Behavioral health evaluated the patient and he does not meet inpatient criteria. Spoke with a nurse practitioner who recommended trazodone 50 mg at night for sleep and also patient was given information for intensive or partial outpatient treatment with behavioral health as well as other psychiatric resources. Patient is recommended to follow-up with his doctor next week as well.  Final Clinical Impression(s) / ED Diagnoses Final diagnoses:  Anxiety    Rx / DC Orders ED Discharge Orders         Ordered    traZODone (DESYREL) 50 MG tablet  Daily at bedtime     04/20/19 1647           Blanchie Dessert, MD 04/20/19 1647

## 2019-04-20 NOTE — ED Triage Notes (Signed)
Pt states that he is being evicted from his home that he has lived in for 29 years and is having trouble accepting his responsibility and going to live on his own, as he has never done this. Pt states he has been very anxious every since his father died 3 months ago and wants medication to help him with this. Denies SI/HI. Alert and oriented.

## 2019-04-20 NOTE — Discharge Instructions (Addendum)
You can also call [phone] at behavior health for intensive or partial outpatient treatment.

## 2019-04-20 NOTE — ED Notes (Signed)
Pt speaking with TTS 

## 2019-04-27 ENCOUNTER — Emergency Department (HOSPITAL_COMMUNITY): Payer: Medicaid Other

## 2019-04-27 ENCOUNTER — Other Ambulatory Visit: Payer: Self-pay

## 2019-04-27 ENCOUNTER — Emergency Department (HOSPITAL_COMMUNITY)
Admission: EM | Admit: 2019-04-27 | Discharge: 2019-04-27 | Disposition: A | Discharge status: Home / Self Care | Payer: Medicaid Other | Attending: Emergency Medicine | Admitting: Emergency Medicine

## 2019-04-27 ENCOUNTER — Encounter (HOSPITAL_COMMUNITY): Payer: Self-pay

## 2019-04-27 DIAGNOSIS — Z7982 Long term (current) use of aspirin: Secondary | ICD-10-CM | POA: Diagnosis not present

## 2019-04-27 DIAGNOSIS — Z79899 Other long term (current) drug therapy: Secondary | ICD-10-CM | POA: Insufficient documentation

## 2019-04-27 DIAGNOSIS — Z20828 Contact with and (suspected) exposure to other viral communicable diseases: Secondary | ICD-10-CM | POA: Insufficient documentation

## 2019-04-27 DIAGNOSIS — F419 Anxiety disorder, unspecified: Secondary | ICD-10-CM | POA: Insufficient documentation

## 2019-04-27 DIAGNOSIS — E1140 Type 2 diabetes mellitus with diabetic neuropathy, unspecified: Secondary | ICD-10-CM | POA: Insufficient documentation

## 2019-04-27 DIAGNOSIS — Z20822 Contact with and (suspected) exposure to covid-19: Secondary | ICD-10-CM

## 2019-04-27 DIAGNOSIS — Z7984 Long term (current) use of oral hypoglycemic drugs: Secondary | ICD-10-CM | POA: Insufficient documentation

## 2019-04-27 DIAGNOSIS — I10 Essential (primary) hypertension: Secondary | ICD-10-CM | POA: Insufficient documentation

## 2019-04-27 LAB — BASIC METABOLIC PANEL
Anion gap: 11 (ref 5–15)
BUN: 15 mg/dL (ref 8–23)
CO2: 23 mmol/L (ref 22–32)
Calcium: 9 mg/dL (ref 8.9–10.3)
Chloride: 105 mmol/L (ref 98–111)
Creatinine, Ser: 1.05 mg/dL (ref 0.61–1.24)
GFR calc Af Amer: >60 mL/min (ref >60)
GFR calc non Af Amer: >60 mL/min (ref >60)
Glucose, Bld: 124 mg/dL — ABNORMAL HIGH (ref 70–99)
Potassium: 3.8 mmol/L (ref 3.5–5.1)
Sodium: 139 mmol/L (ref 135–145)

## 2019-04-27 LAB — CBC
HCT: 45.2 % (ref 39.0–52.0)
Hemoglobin: 15.2 g/dL (ref 13.0–17.0)
MCH: 27.9 pg (ref 26.0–34.0)
MCHC: 33.6 g/dL (ref 30.0–36.0)
MCV: 82.9 fL (ref 80.0–100.0)
Platelets: 172 10*3/uL (ref 150–400)
RBC: 5.45 MIL/uL (ref 4.22–5.81)
RDW: 13.7 % (ref 11.5–15.5)
WBC: 12.5 10*3/uL — ABNORMAL HIGH (ref 4.0–10.5)
nRBC: 0 % (ref 0.0–0.2)

## 2019-04-27 LAB — TROPONIN I (HIGH SENSITIVITY): Troponin I (High Sensitivity): 3 ng/L (ref <18)

## 2019-04-27 MED ORDER — SODIUM CHLORIDE 0.9% FLUSH: 3.0000 mL | Freq: Once | INTRAVENOUS | Status: DC

## 2019-04-27 NOTE — ED Triage Notes (Signed)
Pt states that his stepmother lives upstairs and has COVID. Pt states that for 2-3 days, he has been [facility] and has had chest pain, feeling like his heart is "in a clamp". Pt states that he has had clear emesis as well.

## 2019-04-27 NOTE — ED Provider Notes (Signed)
[facility] DEPT Provider Note   CSN: [id number] Arrival date & time: 04/27/19  1300     History Chief Complaint  Patient presents with  . Shortness of Breath  . Emesis  . Chest Pain    Jacob [name] is a 63 y.o. male presenting for covid testing.   Patient states he is concerned he may have Covid.  His stepmother is positive for Covid, however has been isolating upstairs.  [name] worried that Covid has made it through the ventilation system.  He states of the past several days he has felt more short of breath than normal.  He reports his chest feels like it is being squeezed, but this has been present for about a month.  He reports 2 episodes of "clear emesis" and 2 days of diarrhea.  He denies known fevers, chills, sore throat, chest pain.  He had mild coughing last night.  He denies abdominal pain, urinary symptoms.  Patient states he has been very anxious recently, as he has lived in his house for 29 years, and his stepmom is selling it and he will have to move.  He has never lived by himself, this is causing him to be feel very anxious.  He does feel this anxiety is contributing to his symptoms today.  [name] not currently on any medication for anxiety or depression.  HPI     Past Medical History:  Diagnosis Date  . Anxiety   . Arthritis    "hurt qwhere in my bones; especially early in the morning" (06/08/2016)  . Chronic cough 08-10-11   more than 6 yrs from sinus drainage  . 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   . Complication of anesthesia    prolonged emergence  . Depression    depression(due to childhood issues)  . 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 Erectile dysfunction 08/23/2013  . Gout, unspecified 06/29/2006   Qualifier: Diagnosis of  By: Herma Ard    . High cholesterol   . History of gout   . HYPERLIPIDEMIA 02/19/2007   Qualifier: Diagnosis of  By: Darylene Price MD, Elta Guadeloupe    . Hypertension 08-10-11   tx. meds  . Increased urinary frequency   . Kidney stones 08-10-11   once in past, none recent  . Methylprednisolone-induced hypoxia 08/23/2011  . Neuromuscular disorder ([place]) 08-10-11   "burning/tingling sensation In feet"  . Obesity   . Obesity, Class II, BMI 35-39.9, with comorbidity 06/29/2006  . OSA (obstructive sleep apnea)    "couldn't deal w/CPAP" (06/08/2016)  . Preoperative clearance   . RUQ pain 06/09/2016  . Shortness of breath 08-10-11   with increased activity only  . Syncope and collapse 06/09/2016  . Syncope and collapse 06/09/2016  . Type II diabetes mellitus ([place])   . Umbilical hernia     Patient Active Problem List   Diagnosis Date Noted  . Housing problems 03/21/2019  . Severe recurrent major depression without psychotic features ([place]) 10/04/2018  . Depression, recurrent ([place]) 09/17/2018  . Urinary frequency 02/09/2018  . Syncope and collapse 06/09/2016  . Hepatic steatosis 06/09/2016  . Biliary colic   . Uncontrolled type 2 diabetes mellitus with diabetic neuropathy ([place])   . ACS (acute coronary syndrome) ([place])  06/08/2016  . Erectile dysfunction 08/23/2013  . Ventral hernia with obstruction 09/09/2011  . 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 OSA (obstructive sleep apnea) 08/23/2011  . Colonic polyp 08/04/2011  . Cataract 12/01/2010  . Chest pain 07/15/2010  . Type II diabetes mellitus with neurological manifestations, uncontrolled ([place]) 10/03/2008  . HYPERLIPIDEMIA 02/19/2007  . Gout, unspecified 06/29/2006  . Obesity, Class II, BMI 35-39.9, with comorbidity 06/29/2006  . MDD (major depressive disorder), recurrent episode, mild ([place]) 06/29/2006  . Anxiety state 06/29/2006  . Essential  hypertension, benign 06/29/2006    Past Surgical History:  Procedure Laterality Date  . CATARACT EXTRACTION W/ INTRAOCULAR LENS  IMPLANT, BILATERAL Bilateral 08/10/2011 - ~ 2014   "right - left"  . CHOLECYSTECTOMY N/A 06/10/2016   Procedure: LAPAROSCOPIC CHOLECYSTECTOMY WITH INTRAOPERATIVE CHOLANGIOGRAM;  Surgeon: Erroll Luna, MD;  Location: [facility];  Service: General;  Laterality: N/A;  . COLONOSCOPY W/ BIOPSIES AND POLYPECTOMY  2013  . HERNIA REPAIR    . LIPOMA EXCISION Right 10/18/2018   Procedure: EXCISION RIGHT BACK  LIPOMA X 2;  Surgeon: Erroll Luna, MD;  Location: [facility];  Service: General;  Laterality: Right;  . VENTRAL HERNIA REPAIR  08/22/2011   Procedure: HERNIA REPAIR VENTRAL ADULT;  Surgeon: Adin Hector, MD;  Location: [facility];  Service: General;  Laterality: N/A;  incarcerated       Family History  Problem Relation Age of Onset  . Hyperlipidemia Mother   . Diabetes Mother   . Heart disease Father   . Hyperlipidemia Father   . Colon cancer Neg Hx     Social History   Tobacco Use  . Smoking status: Never Smoker  . Smokeless tobacco: Never Used  Substance Use Topics  . Alcohol use: No  . Drug use: No    Home Medications Prior to Admission medications   Medication Sig Start Date End Date Taking? Authorizing Provider  amitriptyline (ELAVIL) 10 MG tablet Take 1 tablet (10 mg total) by mouth at bedtime. 01/08/19   Nuala Alpha, DO  amLODipine (NORVASC) 10 MG tablet Take 1 tablet (10 mg total) by mouth daily before breakfast. 01/24/19   Nuala Alpha, DO  aspirin 81 MG EC tablet TAKE 1 TABLET (81 MG TOTAL) BY MOUTH DAILY. 07/08/16   Ronnie Doss M, DO  busPIRone (BUSPAR) 10 MG tablet Take 1 tablet (10 mg total) by mouth 2 (two) times daily. 03/19/19   Nuala Alpha, DO  HYDROcodone-acetaminophen (NORCO/VICODIN) 5-325 MG tablet Take 1 tablet by mouth every 6 (six) hours as needed for moderate pain. 10/18/18   Cornett, Marcello Moores, MD   hydrOXYzine (ATARAX/VISTARIL) 10 MG tablet Take 1 tablet (10 mg total) by mouth 3 (three) times daily as needed. 03/13/19   Nuala Alpha, DO  hydrOXYzine (ATARAX/VISTARIL) 25 MG tablet Take 1 tablet (25 mg total) by mouth every 6 (six) hours. 03/01/19   Recardo Evangelist, PA-C  ibuprofen (ADVIL) 800 MG tablet Take 1 tablet (800 mg total) by mouth every 8 (eight) hours as needed. 10/18/18   Cornett, Marcello Moores, MD  Melatonin 5 MG CAPS Take 1 capsule (5 mg total) by mouth at bedtime. 01/24/19   Nuala Alpha, DO  metFORMIN (GLUCOPHAGE) 1000 MG tablet Take 1 tablet (1,000 mg total) by mouth 2 (two) times daily with a meal. 01/24/19   Lockamy, Timothy, DO  rosuvastatin (CRESTOR) 20 MG tablet Take 1 tablet (20 mg total) by mouth daily. 01/24/19   Nuala Alpha, DO  tamsulosin (FLOMAX) 0.4 MG CAPS capsule Take  1 capsule (0.4 mg total) by mouth daily. 03/21/19   Nuala Alpha, DO  traZODone (DESYREL) 50 MG tablet Take 1 tablet (50 mg total) by mouth at bedtime. 04/20/19   Blanchie Dessert, MD  mirtazapine (REMERON) 15 MG tablet Take 1 tablet (15 mg total) by mouth at bedtime. 03/19/19 04/20/19  Nuala Alpha, DO    Allergies    Methylprednisolone sodium succ  Review of Systems   Review of Systems  Respiratory: Positive for shortness of breath.   Cardiovascular: Positive for chest pain (Chest squeezing).  Gastrointestinal: Positive for diarrhea and vomiting.  Psychiatric/Behavioral: The patient is nervous/anxious.   All other systems reviewed and are negative.   Physical Exam Updated Vital Signs BP (!) 183/106 (BP Location: Left Arm)   Pulse 65   Temp 98.7 F (37.1 C) (Oral)   Resp 16   Ht 5\' 9"  (1.753 m)   Wt 101.6 kg   SpO2 100%   BMI 33.08 kg/m   Physical Exam Vitals and nursing note reviewed.  Constitutional:      General: [name] not in acute distress.    Appearance: [name] [name]-developed.     Comments: Resting comfortably in the bed in no acute distress  HENT:      Head: Normocephalic and atraumatic.  Eyes:     Extraocular Movements: Extraocular movements intact.     Conjunctiva/sclera: Conjunctivae normal.     Pupils: Pupils are equal, round, and reactive to light.  Cardiovascular:     Rate and Rhythm: Normal rate and regular rhythm.     Pulses: Normal pulses.  Pulmonary:     Effort: Pulmonary effort is normal. No respiratory distress.     Breath sounds: Normal breath sounds. No wheezing.     Comments: Clear lung sounds in all fields.  No wheezing, rales, rhonchi.  Speaking in full sentences.  No signs of respiratory distress.  Sats stable on room air. Abdominal:     General: There is no distension.     Palpations: Abdomen is soft. There is no mass.     Tenderness: There is no abdominal tenderness. There is no guarding or rebound.  Musculoskeletal:        General: Normal range of motion.     Cervical back: Normal range of motion and neck supple.     Right lower leg: No edema.     Left lower leg: No edema.  Skin:    General: Skin is warm and dry.     Capillary Refill: Capillary refill takes less than 2 seconds.  Neurological:     Mental Status: [name] alert and oriented to person, place, and time.  Psychiatric:        Mood and Affect: Mood is anxious.     ED Results / Procedures / Treatments   Labs (all labs ordered are listed, but only abnormal results are displayed) Labs Reviewed  BASIC METABOLIC PANEL - Abnormal; Notable for the following components:      Result Value   Glucose, Bld 124 (*)    All other components within normal limits  CBC - Abnormal; Notable for the following components:   WBC 12.5 (*)    All other components within normal limits  NOVEL CORONAVIRUS, NAA (HOSP ORDER, SEND-OUT TO REF LAB; TAT 18-24 HRS)  TROPONIN I (HIGH SENSITIVITY)    EKG EKG Interpretation  Date/Time:  Saturday April 27 2019 13:12:47 EST Ventricular Rate:  87 PR Interval:    QRS Duration: 96 QT Interval:  370 QTC Calculation: 446 R  Axis:   24 Text Interpretation: Sinus rhythm Atrial premature complexes Abnormal R-wave progression, early transition Nonspecific repol abnormality, lateral leads Confirmed by Milton Ferguson [phone]) on 04/27/2019 10:14:24 PM   Radiology DG Chest 2 View  Result Date: 04/27/2019 CLINICAL DATA:  Shortness of breath and chest pain EXAM: CHEST - 2 VIEW COMPARISON:  October 03, 2018 FINDINGS: The heart size and mediastinal contours are within normal limits. Both lungs are clear. Chronic elevation of left hemidiaphragm is unchanged. The visualized skeletal structures are unremarkable. IMPRESSION: No active cardiopulmonary disease. Electronically Signed   By: Abelardo Diesel M.D.   On: 04/27/2019 13:45    Procedures Procedures (including critical care time)  Medications Ordered in ED Medications - No data to display  ED Course  I have reviewed the triage vital signs and the nursing notes.  Pertinent labs & imaging results that were available during my care of the patient were reviewed by me and considered in my medical decision making (see chart for details).    MDM Rules/Calculators/A&P                      Patient presenting for Covid testing.  On exam, patient appears nontoxic.  Pulmonary exam is reassuring.  Even if he is Covid positive, I do not believe he needs admission to the hospital at this time.  However I have low suspicion for coronavirus, I feel many of his symptoms are due to his anxiety about needing to leave his house.  Discussed at length the importance of following up with a psychiatrist and/or therapist for further management of his anxiety.  I saw patient 9 hours into his ER visit.  Labs obtained from triage are reassuring.  Nonspecific leukocytosis at 12.5, otherwise normal.  Initial troponin negative.  Chest x-ray viewed interpreted by me, no pneumonia, pneumothorax or effusion, cardiomegaly.  EKG without STEMI.  Discussed findings and plan with patient.  At this time, patient  present for discharge.  Return questions given.  Patient states understands and agrees to plan.  ZAMEER AWWAD was evaluated in Emergency Department on 04/27/2019 for the symptoms described in the history of present illness. He was evaluated in the context of the global COVID-19 pandemic, which necessitated consideration that the patient might be at risk for infection with the SARS-CoV-2 virus that causes COVID-19. Institutional protocols and algorithms that pertain to the evaluation of patients at risk for COVID-19 are in a state of rapid change based on information released by regulatory bodies including the CDC and federal and state organizations. These policies and algorithms were followed during the patient's care in the ED.  Final Clinical Impression(s) / ED Diagnoses Final diagnoses:  Encounter for screening laboratory testing for COVID-19 virus  Anxiety    Rx / DC Orders ED Discharge Orders    None       Franchot Heidelberg, PA-C 04/27/19 2231    Milton Ferguson, MD 04/28/19 2215

## 2019-04-27 NOTE — Discharge Instructions (Signed)
It is very important that you follow-up with a psychiatrist/therapist about your anxiety and depression. You were tested for coronavirus today.  If positive, you will receive a phone call.  If negative, you will not.  Either way, you may check online on MyChart. Return to the ER with any new, worsening, or concerning symptoms.

## 2019-04-29 LAB — NOVEL CORONAVIRUS, NAA (HOSP ORDER, SEND-OUT TO REF LAB; TAT 18-24 HRS): SARS-CoV-2, NAA: NOT DETECTED

## 2019-05-07 ENCOUNTER — Ambulatory Visit: Payer: Self-pay | Admitting: Licensed Clinical Social Worker

## 2019-05-07 ENCOUNTER — Ambulatory Visit: Payer: Medicaid Other | Admitting: Licensed Clinical Social Worker

## 2019-05-07 ENCOUNTER — Other Ambulatory Visit: Payer: Self-pay

## 2019-05-07 DIAGNOSIS — F339 Major depressive disorder, recurrent, unspecified: Secondary | ICD-10-CM

## 2019-05-07 DIAGNOSIS — Z7189 Other specified counseling: Secondary | ICD-10-CM

## 2019-05-07 DIAGNOSIS — F411 Generalized anxiety disorder: Secondary | ICD-10-CM

## 2019-05-07 NOTE — Patient Instructions (Signed)
Licensed Clinical Social Worker Visit Information Mr. Shonk  it was nice speaking with you. Please call me directly if you have questions [phone] Goals we discussed today:  Goals Addressed            This Visit's Progress   . Connect with mental health provider for medication to manage and reduce symptoms   On track    Current Barriers:  . Unmet Chronic Mental Health needs related to anxiety and depression for patient with HTN and DMII. Marland Kitchen Patient needs support, education, and care coordination needs for mental health support and counseling resources . Went to appointment at [facility] for psychiatry needs but they did not provide any medication . Feeling anxious and overwhelmed with housing concerns  Clinical Social Work Goal(s):  Marland Kitchen Over the next 30 days, patient will contact [facility] for therapy and psychiatry needs. Interventions: . Assessed needs for mental health  . No Suicidal Ideation/Homicidal Ideation present . Provided basic mental health support and interventions  . Offered to called [facility] with patient on the phone so that he could make the appointment however he declined but took the phone number. . Motivational Interviewing as well as solution focused interventions completed  Patient Self Care Activities & Deficits: :  . Performs ADL's independently . Has a car for transportation . Does not adhere to provider recommendations re: psychiatry  . Has limited minutes on cell phone  Patient Coping Strengths:  . Able to Communicate Effectively . Has safety plan if he needs it ( discussed this with patient again today) Please see past updates related to this goal by clicking on the "Past Updates" button in the selected goal     . COMPLETED: I need help finding housing   On track    Current Barriers:  . housing need for patient with HTN, DMII, Anxiety, and Depression . Patient informed LCSW he has located an apartment and will be moving in soon . Mental  Health Concerns  Clinical Goal(s)  Over the next 30 days, patient will:  . Allow sister to support him with moving into his new apartments  Interventions :  . Assessed patient's care needs and support system Patient Self Care Activities & Deficits:  . Attends all scheduled provider appointments . Has limited minutes on his cell phone to make or receive call Please see past updates related to this goal by clicking on the "Past Updates" button in the selected goal        Materials provided:  Mr. Hanish was given information about Care Management services today including:  1. Care Management services include personalized support from designated clinical staff supervised by his physician, including individualized plan of care and coordination with other care providers 2. 24/7 contact [phone] for assistance for urgent and routine care needs. 3. Care Management services at any time by phone call to the office staff. The patient verbalized understanding of instructions provided today and declined a print copy of patient instruction materials.  Follow up plan: Patient will contact LCSW or [facility] as needed  Maurine Cane, LCSW

## 2019-05-07 NOTE — Chronic Care Management (AMB) (Signed)
  Social Work Care Management  Unsuccessful phone outreach  05/07/2019 Name: Jacob [name] MRN: [id number] DOB: [birth year]  Referred by: Nuala Alpha, DO Reason for referral : Care Coordination (connect with psychiatry ) F/U call to patient to see if he kept appointment set up with psychiatry.    Follow Up Plan: A HIPPA compliant phone message was left for the patient providing contact information and requesting a return call.  No further follow up required by LCSW at this time.  Will wait for patient to return call. If patient calls back and would like to schedule a phone appointment place him on CCM social work schedule.  Casimer Lanius, LCSW Clinical Social Worker [place] / [place]   [phone] 11:18 AM

## 2019-05-07 NOTE — Chronic Care Management (AMB) (Signed)
Social Work Care Management  Follow Up Note   05/07/2019 Name: Jacob [name] MRN: [id number] DOB: June 16, 1955  Referred by: Nuala Alpha, DO Reason for referral : Care Coordination (F/U mental health support)  Jacob [name] is a 64 y.o. year old male who is a primary care patient of Nuala Alpha, DO.  Reason for follow-up: phone encounter with patient today for ongoing assessment and brief interventions to assist with managing stressors and connecting to psychiatry. Assessment: Patient continues to experience symptoms of stress and anxiety which are exacerbated by concerns with housing.     Patient has made progress towards goal of finding housing and will be moving in [place].   Recommendation: Patient may benefit from, connecting with psychiatry. Review of patient status, including review of consultants reports, relevant laboratory and other test results, and collaboration with appropriate care team members and the patient's provider was performed as part of comprehensive patient evaluation and provision of chronic care management services.   SDOH (Social Determinants of Health) screening performed today:  See Care Plan for related entries.   Goals Addressed            This Visit's Progress   . Connect with mental health provider for medication to manage and reduce symptoms   Not On track    Current Barriers:  . Unmet Chronic Mental Health needs related to anxiety and depression for patient with HTN and DMII. Marland Kitchen Patient needs support, education, and care coordination needs for mental health support and counseling resources . Went to appointment at [facility] for psychiatry needs but they did not provide any medication . Feeling anxious and overwhelmed with housing concerns  Clinical Social Work Goal(s):  Marland Kitchen Over the next 30 days, patient will contact [facility] for therapy and psychiatry needs. Interventions: . Assessed needs for mental health  . No Suicidal  Ideation/Homicidal Ideation present . Provided basic mental health support and interventions  . Offered to called [facility] with patient on the phone so that he could make the appointment however he declined but took the phone number. . Motivational Interviewing as well as solution focused interventions completed  Patient Self Care Activities & Deficits: :  . Performs ADL's independently . Has a car for transportation . Does not adhere to provider recommendations re: psychiatry  . Has limited minutes on cell phone  Patient Coping Strengths:  . Able to Communicate Effectively . Has safety plan if he needs it ( discussed this with patient again today) Please see past updates related to this goal by clicking on the "Past Updates" button in the selected goal     . COMPLETED: I need help finding housing   On track    Current Barriers:  . housing need for patient with HTN, DMII, Anxiety, and Depression . Patient informed LCSW he has located an apartment and will be moving in soon . Mental Health Concerns  Clinical Goal(s)  Over the next 30 days, patient will:  . Allow sister to support him with moving into his new apartments  Interventions :  . Assessed patient's care needs and support system Patient Self Care Activities & Deficits:  . Attends all scheduled provider appointments . Has limited minutes on his cell phone to make or receive call Please see past updates related to this goal by clicking on the "Past Updates" button in the selected goal      Plan:  1. Patient will call [facility] and set up appointment for therapy and  psychiatry when he is ready.   2. Patient will contact office if additional resources are needed 3. No additional follow up needed from LCSW at this time.  Casimer Lanius, LCSW Clinical Social Worker [place] / [place]   [phone] 2:20 PM

## 2019-05-13 ENCOUNTER — Telehealth: Payer: Self-pay | Admitting: Family Medicine

## 2019-05-13 NOTE — Telephone Encounter (Signed)
Patient sleeping pills (Xanax). Told patient he would most likely need appt to discuss this but patient says 'he doesn't have time for that and he has too much going on.' Patients call back number: [phone]

## 2019-05-14 NOTE — Telephone Encounter (Signed)
Attempted to call patient concerning request for RX.   No answer and no ability to leave message. Will try again later.  Ozella Almond, [place]

## 2019-07-29 ENCOUNTER — Ambulatory Visit: Payer: Medicaid Other | Attending: Internal Medicine

## 2019-07-29 DIAGNOSIS — Z23 Encounter for immunization: Secondary | ICD-10-CM

## 2019-07-29 NOTE — Progress Notes (Signed)
   Covid-19 Vaccination Clinic  Name:  Jacob [name]    MRN: [id number] DOB: Jan 21, 1956  07/29/2019  Jacob [name] was observed post Covid-19 immunization for 15 minutes without incident. He was provided with Vaccine Information Sheet and instruction to access the V-Safe system.   Jacob [name] was instructed to call 911 with any severe reactions post vaccine: Marland Kitchen Difficulty breathing  . Swelling of face and throat  . A fast heartbeat  . A bad rash all over body  . Dizziness and weakness   Immunizations Administered    Name Date Dose VIS Date Route   Pfizer COVID-19 Vaccine 07/29/2019  3:45 PM 0.3 mL 04/12/2019 Intramuscular   Manufacturer: [place]   Lot: [id number]   [place]: [id number]

## 2019-08-21 ENCOUNTER — Ambulatory Visit: Payer: Medicaid Other | Attending: Internal Medicine

## 2019-08-21 DIAGNOSIS — Z23 Encounter for immunization: Secondary | ICD-10-CM

## 2019-08-21 NOTE — Progress Notes (Signed)
   Covid-19 Vaccination Clinic  Name:  Jacob [name]    MRN: [id number] DOB: 1955/12/24  08/21/2019  Jacob [name] was observed post Covid-19 immunization for 15 minutes without incident. He was provided with Vaccine Information Sheet and instruction to access the V-Safe system.   Jacob [name] was instructed to call 911 with any severe reactions post vaccine: Marland Kitchen Difficulty breathing  . Swelling of face and throat  . A fast heartbeat  . A bad rash all over body  . Dizziness and weakness   Immunizations Administered    Name Date Dose VIS Date Route   Pfizer COVID-19 Vaccine 08/21/2019  3:30 PM 0.3 mL 06/26/2018 Intramuscular   Manufacturer: [place]   Lot: [id number]   [place]: [id number]

## 2019-11-15 ENCOUNTER — Ambulatory Visit (INDEPENDENT_AMBULATORY_CARE_PROVIDER_SITE_OTHER): Payer: Medicaid Other | Admitting: Family Medicine

## 2019-11-15 ENCOUNTER — Other Ambulatory Visit: Payer: Self-pay

## 2019-11-15 VITALS — BP 130/70 | HR 97 | Ht 69.0 in | Wt 227.0 lb

## 2019-11-15 DIAGNOSIS — E1140 Type 2 diabetes mellitus with diabetic neuropathy, unspecified: Secondary | ICD-10-CM | POA: Diagnosis present

## 2019-11-15 DIAGNOSIS — IMO0002 Reserved for concepts with insufficient information to code with codable children: Secondary | ICD-10-CM

## 2019-11-15 DIAGNOSIS — F411 Generalized anxiety disorder: Secondary | ICD-10-CM

## 2019-11-15 DIAGNOSIS — R82998 Other abnormal findings in urine: Secondary | ICD-10-CM

## 2019-11-15 DIAGNOSIS — E1165 Type 2 diabetes mellitus with hyperglycemia: Secondary | ICD-10-CM

## 2019-11-15 LAB — GLUCOSE, POCT (MANUAL RESULT ENTRY): POC Glucose: 132 mg/dl — AB (ref 70–99)

## 2019-11-15 LAB — POCT GLYCOSYLATED HEMOGLOBIN (HGB A1C): HbA1c, POC (controlled diabetic range): 5.8 % (ref 0.0–7.0)

## 2019-11-15 NOTE — Assessment & Plan Note (Addendum)
Ordered urinalysis and kidney function labs today. Unfortunately, no urine sample was obtained. Will obtain urine sample at next visit and UPC.  Patient agrees with close follow up with PCP for other symptoms he is having. Stressed importance of organizing visits and agenda setting to help maximize his time with the physician so that we can help him with medical issues and concerns.

## 2019-11-15 NOTE — Assessment & Plan Note (Signed)
Patient with severe anxiety that is currently managed with psychiatry and psychology.  Recommended that patient continue to follow with those doctors and counselors as they are his first-line.  We will not continue to manage his medications if he is already being seen.  He does report that he sees psychiatry once monthly.  Though, he does not think that they are helping very much.  Patient overall feels overwhelmed by life responsibilities and does not know what to do when problems come up.  [name] also very afraid that he is dying or has cancer.  He brings a long list of complaints with him today.  He understands that we will not be managing any psychiatric medication for him and that he will need to call his psychiatrist and psychologist once he leaves the office and will call them for future psychiatric issues. Encourage patient to follow very closely with PCP.  Would like patient to have 1 visit once every 1 to 2 weeks to review his symptoms and rule out medical causes.  I think this will be very helpful for him and his anxiety.  Patient understands that only a few topics will be covered each appointment and they will be prioritized and agreed upon by patient and physician.  I have reassured him that his hypertension and his diabetes are currently well controlled.

## 2019-11-15 NOTE — Progress Notes (Signed)
SUBJECTIVE:   CHIEF COMPLAINT / HPI:   Anxiety  Patient's dad passed away in February 19, 2023.  Patient has been living in dad's house until recently when his stepmom kicked him out.  [name] overwhelmed with his new responsibilities and is worried that his medication is not working.   Patient seen on 01/31/2019 with Dr. Garlan Fillers and per chart review, appears that his depression was not well controlled at that time and he was not taking any medication.  He was referred to psychology and psychiatry given poor response to multiple first-line medications.  He was seen again on 03/21/2019 where he was started on BuSpar 10 mg twice daily for depression and anxiety.  He was supposed to follow-up in 2 weeks but did not make an appointment.  His current medications include hydroxyzine, BuSpar, amitriptyline, trazodone.  It has been stressed multiple times to him that it is necessary for him to find counseling and psychiatry. Patient reports that he is taking Wellbutrin and two other medications.  Patient has two sisters who live in [place]. Don't see very often. Talks on the phone with them.   Per CCM note in 05/07/19, patient encouraged to call [facility], which he reports he did, but there was one issue that he didn't know how to deal with, so he didn't. CCM signed off at that time.   Patient has long list of complaints today and is very afraid that he is dying from cancer.   At the end of the visit, he reports amber urine x 3 months.   PERTINENT  PMH / PSH: Severe anxiety, depression  OBJECTIVE:   BP 130/70   Pulse 97   Ht 5\' 9"  (1.753 m)   Wt 227 lb (103 kg)   SpO2 98%   BMI 33.52 kg/m   General: Well-appearing male, nontoxic, mild distress Psych: Patient is well groomed with good hygiene.  Speech is normal with normal rate, latency, volume and intonation.  Behavior is normal with normal eye contact.  Patient is friendly, cooperative. He focuses on difficulties with facing barriers and common  problems that occur with life. His thought processes are not always logical. No SI/HI/AVH.   ASSESSMENT/PLAN:   Amber-colored urine Ordered urinalysis and kidney function labs today. Unfortunately, no urine sample was obtained. Will obtain urine sample at next visit and UPC.  Patient agrees with close follow up with PCP for other symptoms he is having. Stressed importance of organizing visits and agenda setting to help maximize his time with the physician so that we can help him with medical issues and concerns.   Anxiety state Patient with severe anxiety that is currently managed with psychiatry and psychology.  Recommended that patient continue to follow with those doctors and counselors as they are his first-line.  We will not continue to manage his medications if he is already being seen.  He does report that he sees psychiatry once monthly.  Though, he does not think that they are helping very much.  Patient overall feels overwhelmed by life responsibilities and does not know what to do when problems come up.  [name] also very afraid that he is dying or has cancer.  He brings a long list of complaints with him today.  He understands that we will not be managing any psychiatric medication for him and that he will need to call his psychiatrist and psychologist once he leaves the office and will call them for future psychiatric issues. Encourage patient to follow  very closely with PCP.  Would like patient to have 1 visit once every 1 to 2 weeks to review his symptoms and rule out medical causes.  I think this will be very helpful for him and his anxiety.  Patient understands that only a few topics will be covered each appointment and they will be prioritized and agreed upon by patient and physician.  I have reassured him that his hypertension and his diabetes are currently well controlled.   Wilber Oliphant, MD [facility]

## 2019-11-15 NOTE — Patient Instructions (Addendum)
I would like to get some labs for you today for your diabetes and high blood pressure.  We will also get a urinalysis given your 25months in your urine. Please follow-up with your psychiatrist and counselor soon as possible to talk about your medications and different ways that you can overcome the barriers that occurred during the day. Please call back to make an appointment or several appointments with your PCP to discuss the issues that you are currently having.  As I said, I encourage you to make a list of all of your issues so that you can go through a few at a time at next visit.  I think this would be very helpful for your anxiety and generic sure that we are being very thorough with your overall health.    Support in a Crisis What if I or someone I know is in crisis?  . If you are thinking about harming yourself or having thoughts of suicide, or if you know someone who is, seek help right away. . Call your doctor or mental health care provider. . Call 911 or go to a hospital emergency room to get immediate help, or ask a friend or family member to help you do these things. . Call the [place] National Suicide Prevention Lifeline's toll-free, 24-hour hotline at 1-800-273-TALK (208)206-1053) or TTY: 1-800-799-4 TTY 531-868-5248) to talk to a trained counselor. . If you are in crisis, make sure you are not left alone.  . If someone else is in crisis, make sure he or she is not left alone  24 Hour [facility]ailability [facility]  [address] , [place], [place] [address]  [phone]  For Crisis: [phone]   [employer] [facility] [employer] (Domestic Violence, Rape & Victim Assistance [phone]  [facility] [place]  [address], [place]  [address]               [phone] or [phone]  [place]    (ONLY from 8am-4pm)    [phone]  [facility] (24/7)    [phone]  [place] National Suicide Hotline   (304)355-6818 Diamantina Monks)

## 2019-11-16 LAB — CBC
Hematocrit: 43.7 % (ref 37.5–51.0)
Hemoglobin: 14.5 g/dL (ref 13.0–17.7)
MCH: 27.2 pg (ref 26.6–33.0)
MCHC: 33.2 g/dL (ref 31.5–35.7)
MCV: 82 fL (ref 79–97)
Platelets: 168 10*3/uL (ref 150–450)
RBC: 5.34 x10E6/uL (ref 4.14–5.80)
RDW: 14 % (ref 11.6–15.4)
WBC: 9.2 10*3/uL (ref 3.4–10.8)

## 2019-11-16 LAB — BASIC METABOLIC PANEL
BUN/Creatinine Ratio: 13 (ref 10–24)
BUN: 13 mg/dL (ref 8–27)
CO2: 25 mmol/L (ref 20–29)
Calcium: 9.7 mg/dL (ref 8.6–10.2)
Chloride: 101 mmol/L (ref 96–106)
Creatinine, Ser: 1.01 mg/dL (ref 0.76–1.27)
GFR calc Af Amer: 91 mL/min/{1.73_m2} (ref >59)
GFR calc non Af Amer: 79 mL/min/{1.73_m2} (ref >59)
Glucose: 126 mg/dL — ABNORMAL HIGH (ref 65–99)
Potassium: 4 mmol/L (ref 3.5–5.2)
Sodium: 139 mmol/L (ref 134–144)

## 2019-11-19 ENCOUNTER — Encounter: Payer: Self-pay | Admitting: Family Medicine

## 2020-01-28 ENCOUNTER — Other Ambulatory Visit: Payer: Self-pay | Admitting: Family Medicine

## 2020-01-28 DIAGNOSIS — I10 Essential (primary) hypertension: Secondary | ICD-10-CM

## 2020-01-29 NOTE — Telephone Encounter (Signed)
Needs appointment

## 2020-01-29 NOTE — Telephone Encounter (Signed)
LVM asking for a return call to schedule an appt. Ottis Stain, CMA

## 2020-02-14 NOTE — Telephone Encounter (Signed)
LVM on home and mobile number to call office back to assist in getting appointment scheduled per Dr. Sandi Carne. [name], CMA

## 2020-02-14 NOTE — Telephone Encounter (Signed)
Patient returns call to nurse line. Informed patient of need to schedule follow up appointment. Patient states that he will need to check if he still has insurance coverage and will then call us back to schedule.   To PCP  Talbot Grumbling, RN

## 2020-02-25 ENCOUNTER — Other Ambulatory Visit: Payer: Self-pay | Admitting: Family Medicine

## 2020-02-25 DIAGNOSIS — I10 Essential (primary) hypertension: Secondary | ICD-10-CM

## 2020-02-26 NOTE — Telephone Encounter (Signed)
Patient needs appointment in next 3 months.

## 2020-06-01 ENCOUNTER — Emergency Department (HOSPITAL_COMMUNITY)
Admission: EM | Admit: 2020-06-01 | Discharge: 2020-06-02 | Disposition: A | Discharge status: Home / Self Care | Payer: Medicaid Other | Attending: Emergency Medicine | Admitting: Emergency Medicine

## 2020-06-01 ENCOUNTER — Encounter (HOSPITAL_COMMUNITY): Payer: Self-pay | Admitting: *Deleted

## 2020-06-01 ENCOUNTER — Other Ambulatory Visit: Payer: Self-pay

## 2020-06-01 DIAGNOSIS — R1110 Vomiting, unspecified: Secondary | ICD-10-CM | POA: Diagnosis not present

## 2020-06-01 DIAGNOSIS — E1140 Type 2 diabetes mellitus with diabetic neuropathy, unspecified: Secondary | ICD-10-CM | POA: Diagnosis not present

## 2020-06-01 DIAGNOSIS — Z79899 Other long term (current) drug therapy: Secondary | ICD-10-CM | POA: Insufficient documentation

## 2020-06-01 DIAGNOSIS — R109 Unspecified abdominal pain: Secondary | ICD-10-CM | POA: Insufficient documentation

## 2020-06-01 DIAGNOSIS — Z7982 Long term (current) use of aspirin: Secondary | ICD-10-CM | POA: Diagnosis not present

## 2020-06-01 DIAGNOSIS — Z7984 Long term (current) use of oral hypoglycemic drugs: Secondary | ICD-10-CM | POA: Diagnosis not present

## 2020-06-01 DIAGNOSIS — I10 Essential (primary) hypertension: Secondary | ICD-10-CM | POA: Insufficient documentation

## 2020-06-01 DIAGNOSIS — G8929 Other chronic pain: Secondary | ICD-10-CM

## 2020-06-01 LAB — URINALYSIS, ROUTINE W REFLEX MICROSCOPIC
Bilirubin Urine: NEGATIVE
Glucose, UA: NEGATIVE mg/dL
Hgb urine dipstick: NEGATIVE
Ketones, ur: NEGATIVE mg/dL
Leukocytes,Ua: NEGATIVE
Nitrite: NEGATIVE
Protein, ur: NEGATIVE mg/dL
Specific Gravity, Urine: 1.02 (ref 1.005–1.030)
pH: 5 (ref 5.0–8.0)

## 2020-06-01 LAB — COMPREHENSIVE METABOLIC PANEL
ALT: 32 U/L (ref 0–44)
AST: 24 U/L (ref 15–41)
Albumin: 3.7 g/dL (ref 3.5–5.0)
Alkaline Phosphatase: 49 U/L (ref 38–126)
Anion gap: 13 (ref 5–15)
BUN: 17 mg/dL (ref 8–23)
CO2: 24 mmol/L (ref 22–32)
Calcium: 9.2 mg/dL (ref 8.9–10.3)
Chloride: 99 mmol/L (ref 98–111)
Creatinine, Ser: 1.27 mg/dL — ABNORMAL HIGH (ref 0.61–1.24)
GFR, Estimated: >60 mL/min (ref >60)
Glucose, Bld: 144 mg/dL — ABNORMAL HIGH (ref 70–99)
Potassium: 4.9 mmol/L (ref 3.5–5.1)
Sodium: 136 mmol/L (ref 135–145)
Total Bilirubin: 0.8 mg/dL (ref 0.3–1.2)
Total Protein: 7.7 g/dL (ref 6.5–8.1)

## 2020-06-01 LAB — CBC
HCT: 43 % (ref 39.0–52.0)
Hemoglobin: 14.9 g/dL (ref 13.0–17.0)
MCH: 27.9 pg (ref 26.0–34.0)
MCHC: 34.7 g/dL (ref 30.0–36.0)
MCV: 80.5 fL (ref 80.0–100.0)
Platelets: 280 10*3/uL (ref 150–400)
RBC: 5.34 MIL/uL (ref 4.22–5.81)
RDW: 13.4 % (ref 11.5–15.5)
WBC: 11.3 10*3/uL — ABNORMAL HIGH (ref 4.0–10.5)
nRBC: 0 % (ref 0.0–0.2)

## 2020-06-01 LAB — LIPASE, BLOOD: Lipase: 30 U/L (ref 11–51)

## 2020-06-01 MED ORDER — OXYCODONE-ACETAMINOPHEN 5-325 MG PO TABS
1.0000 | ORAL_TABLET | ORAL | Status: DC | PRN
  Administered 2020-06-01: 1 via ORAL
  Filled 2020-06-01: qty 1

## 2020-06-01 NOTE — ED Triage Notes (Signed)
Pt arrived by [facility] and has lower abd pain x 3 days with n/v and dark urine.

## 2020-06-02 ENCOUNTER — Emergency Department (HOSPITAL_COMMUNITY): Payer: Medicaid Other

## 2020-06-02 MED ORDER — OMEPRAZOLE 20 MG PO CPDR: 20.0000 mg | DELAYED_RELEASE_CAPSULE | Freq: Every day | ORAL | 0 refills | Status: DC

## 2020-06-02 MED ORDER — MORPHINE SULFATE (PF) 4 MG/ML IV SOLN
4.0000 mg | Freq: Once | INTRAVENOUS | Status: AC
  Administered 2020-06-02: 4 mg via INTRAVENOUS
  Filled 2020-06-02: qty 1

## 2020-06-02 NOTE — Discharge Instructions (Signed)
Please call and follow-up closely with your primary care provider for further evaluation and managements of your recurrent abdominal pain.  Take Prilosec as prescribed.  Discussed with your GI specialist for a follow-up colonoscopy if indicated.  Return if you have any concern.

## 2020-06-02 NOTE — ED Provider Notes (Signed)
[facility] EMERGENCY DEPARTMENT Provider Note   CSN: [id number] Arrival date & time: 06/01/20  1946     History Chief Complaint  Patient presents with  . Abdominal Pain  . Emesis    Jacob [name] is a 65 y.o. male.  The history is provided by the patient and medical records. No language interpreter was used.  Abdominal Pain Associated symptoms: vomiting   Emesis Associated symptoms: abdominal pain      65 year old male significant history of obesity, diabetes, hyperlipidemia, kidney stone, presenting for evaluation abdominal pain.  Unfortunately due to long wait, recently moved from one room to main area after 12 hours of waiting.  Patient arrived via EMS.  Patient report he has had recurrent abdominal discomfort ongoing for several years but within the past few weeks he noticed an increase in intensity and severity of his pain.  Pain is across his upper abdomen as well as lower abdomen, with increased belching.  He also endorsed noticing some night sweats, excessive sinus drainage, occasional bouts of nausea.  He feels as if he is dying he voiced concern for cancer.  He denies alcohol or tobacco use.  He mention he has not seen his PCP for several years because he does not feel he is receiving any appropriate care.  He has been vaccinated for COVID-19.  Denies any dysuria but did notice that his urine has been darker within the past several months.  He also did notice his stool is a bit lighter.  He denies any liver disease and denies alcohol abuse.  He has had colonoscopy in 2013 with some finding of tubular adenoma.  No constipation or diarrhea reported.  Current report pain is mild but pain has been constant.  Past Medical History:  Diagnosis Date  . Anxiety   . Arthritis    "hurt qwhere in my bones; especially early in the morning" (06/08/2016)  . Chronic cough 08-10-11   more than 6 yrs from sinus drainage  . 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   . Complication of anesthesia    prolonged emergence  . Depression    depression(due to childhood issues)  . 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 Erectile dysfunction 08/23/2013  . Gout, unspecified 06/29/2006   Qualifier: Diagnosis of  By: Herma Ard    . High cholesterol   . History of gout   . HYPERLIPIDEMIA 02/19/2007   Qualifier: Diagnosis of  By: Darylene Price MD, Elta Guadeloupe    . Hypertension 08-10-11   tx. meds  . Increased urinary frequency   . Kidney stones 08-10-11   once in past, none recent  . Methylprednisolone-induced hypoxia 08/23/2011  . Neuromuscular disorder ([place]) 08-10-11   "burning/tingling sensation In feet"  . Obesity   . Obesity, Class II, BMI 35-39.9, with comorbidity 06/29/2006  . OSA (obstructive sleep apnea)    "couldn't deal w/CPAP" (06/08/2016)  . Preoperative clearance   . RUQ pain 06/09/2016  . Shortness of breath 08-10-11   with increased activity only  . Syncope and collapse 06/09/2016  . Syncope and collapse 06/09/2016  . Type II diabetes mellitus ([place])   . Umbilical hernia     Patient Active Problem List   Diagnosis Date Noted  . Amber-colored urine  11/15/2019  . Housing problems 03/21/2019  . Severe recurrent major depression without psychotic features ([place]) 10/04/2018  . Depression, recurrent ([place]) 09/17/2018  . Urinary frequency 02/09/2018  . Syncope and collapse 06/09/2016  . Hepatic steatosis 06/09/2016  . Biliary colic   . Uncontrolled type 2 diabetes mellitus with diabetic neuropathy ([place])   . ACS (acute coronary syndrome) ([place]) 06/08/2016  . Erectile dysfunction 08/23/2013  . Ventral hernia with obstruction 09/09/2011  . 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 OSA  (obstructive sleep apnea) 08/23/2011  . Colonic polyp 08/04/2011  . Cataract 12/01/2010  . Chest pain 07/15/2010  . Type II diabetes mellitus with neurological manifestations, uncontrolled ([place]) 10/03/2008  . HYPERLIPIDEMIA 02/19/2007  . Gout, unspecified 06/29/2006  . Obesity, Class II, BMI 35-39.9, with comorbidity 06/29/2006  . MDD (major depressive disorder), recurrent episode, mild ([place]) 06/29/2006  . Anxiety state 06/29/2006  . Essential hypertension, benign 06/29/2006    Past Surgical History:  Procedure Laterality Date  . CATARACT EXTRACTION W/ INTRAOCULAR LENS  IMPLANT, BILATERAL Bilateral 08/10/2011 - ~ 2014   "right - left"  . CHOLECYSTECTOMY N/A 06/10/2016   Procedure: LAPAROSCOPIC CHOLECYSTECTOMY WITH INTRAOPERATIVE CHOLANGIOGRAM;  Surgeon: Erroll Luna, MD;  Location: [facility];  Service: General;  Laterality: N/A;  . COLONOSCOPY W/ BIOPSIES AND POLYPECTOMY  2013  . HERNIA REPAIR    . LIPOMA EXCISION Right 10/18/2018   Procedure: EXCISION RIGHT BACK  LIPOMA X 2;  Surgeon: Erroll Luna, MD;  Location: [facility];  Service: General;  Laterality: Right;  . VENTRAL HERNIA REPAIR  08/22/2011   Procedure: HERNIA REPAIR VENTRAL ADULT;  Surgeon: Adin Hector, MD;  Location: [facility];  Service: General;  Laterality: N/A;  incarcerated       Family History  Problem Relation Age of Onset  . Hyperlipidemia Mother   . Diabetes Mother   . Heart disease Father   . Hyperlipidemia Father   . Colon cancer Neg Hx     Social History   Tobacco Use  . Smoking status: Never Smoker  . Smokeless tobacco: Never Used  Substance Use Topics  . Alcohol use: No  . Drug use: No    Home Medications Prior to Admission medications   Medication Sig Start Date End Date Taking? Authorizing Provider  amitriptyline (ELAVIL) 10 MG tablet Take 1 tablet (10 mg total) by mouth at bedtime. 01/08/19   Lockamy, Christia Reading, DO  amLODipine (NORVASC) 10 MG tablet TAKE 1 TABLET (10 MG TOTAL)  BY MOUTH DAILY BEFORE BREAKFAST. 02/26/20   Meccariello, Bernita Raisin, DO  aspirin 81 MG EC tablet TAKE 1 TABLET (81 MG TOTAL) BY MOUTH DAILY. 07/08/16   Ronnie Doss M, DO  HYDROcodone-acetaminophen (NORCO/VICODIN) 5-325 MG tablet Take 1 tablet by mouth every 6 (six) hours as needed for moderate pain. 10/18/18   Cornett, Marcello Moores, MD  hydrOXYzine (ATARAX/VISTARIL) 10 MG tablet Take 1 tablet (10 mg total) by mouth 3 (three) times daily as needed. 03/13/19   Nuala Alpha, DO  hydrOXYzine (ATARAX/VISTARIL) 25 MG tablet Take 1 tablet (25 mg total) by mouth every 6 (six) hours. 03/01/19   Recardo Evangelist, PA-C  ibuprofen (ADVIL) 800 MG tablet Take 1 tablet (800 mg total) by mouth every 8 (eight) hours as needed. 10/18/18   Cornett, Marcello Moores, MD  Melatonin 5 MG CAPS Take 1 capsule (5 mg total) by mouth at bedtime. 01/24/19   Nuala Alpha, DO  metFORMIN (GLUCOPHAGE) 1000 MG tablet TAKE 1 TABLET (1,000 MG TOTAL) BY MOUTH 2 (TWO)  TIMES DAILY WITH A MEAL. 01/29/20   Meccariello, Bernita Raisin, DO  rosuvastatin (CRESTOR) 20 MG tablet TAKE 1 TABLET BY MOUTH EVERY DAY 02/26/20   Meccariello, Bernita Raisin, DO  traZODone (DESYREL) 50 MG tablet Take 1 tablet (50 mg total) by mouth at bedtime. 04/20/19   Blanchie Dessert, MD  mirtazapine (REMERON) 15 MG tablet Take 1 tablet (15 mg total) by mouth at bedtime. 03/19/19 04/20/19  Nuala Alpha, DO    Allergies    Methylprednisolone sodium succ  Review of Systems   Review of Systems  Gastrointestinal: Positive for abdominal pain and vomiting.  All other systems reviewed and are negative.   Physical Exam Updated Vital Signs BP 137/77 (BP Location: Right Arm)   Pulse 76   Temp 97.6 F (36.4 C) (Oral)   Resp 20   SpO2 97%   Physical Exam Vitals and nursing note reviewed.  Constitutional:      General: [name] not in acute distress.    Appearance: [name] well-developed and well-nourished. [name] obese.  HENT:     Head: Atraumatic.  Eyes:      Conjunctiva/sclera: Conjunctivae normal.  Cardiovascular:     Rate and Rhythm: Normal rate and regular rhythm.  Pulmonary:     Effort: Pulmonary effort is normal.     Breath sounds: Normal breath sounds.  Abdominal:     General: Abdomen is flat. Bowel sounds are normal.     Palpations: Abdomen is soft.     Tenderness: There is no abdominal tenderness.  Musculoskeletal:     Cervical back: Neck supple.  Skin:    Findings: No rash.  Neurological:     General: No focal deficit present.     Mental Status: [name] alert.  Psychiatric:        Mood and Affect: Mood and affect and mood normal.     ED Results / Procedures / Treatments   Labs (all labs ordered are listed, but only abnormal results are displayed) Labs Reviewed  COMPREHENSIVE METABOLIC PANEL - Abnormal; Notable for the following components:      Result Value   Glucose, Bld 144 (*)    Creatinine, Ser 1.27 (*)    All other components within normal limits  CBC - Abnormal; Notable for the following components:   WBC 11.3 (*)    All other components within normal limits  URINALYSIS, ROUTINE W REFLEX MICROSCOPIC - Abnormal; Notable for the following components:   APPearance HAZY (*)    All other components within normal limits  LIPASE, BLOOD    EKG None  Radiology CT Renal Stone Study  Result Date: 06/02/2020 CLINICAL DATA:  Flank pain. EXAM: CT ABDOMEN AND PELVIS WITHOUT CONTRAST TECHNIQUE: Multidetector CT imaging of the abdomen and pelvis was performed following the standard protocol without IV contrast. COMPARISON:  June 22, 2016. FINDINGS: Lower chest: No acute abnormality. Hepatobiliary: No focal liver abnormality is seen. Status post cholecystectomy. No biliary dilatation. Pancreas: Unremarkable. No pancreatic ductal dilatation or surrounding inflammatory changes. Spleen: Normal in size without focal abnormality. Adrenals/Urinary Tract: Adrenal glands appear normal. Stable small exophytic cyst is seen arising  posteriorly from right kidney. No hydronephrosis or renal obstruction is noted. No renal or ureteral calculi are noted. Urinary bladder is unremarkable. Stomach/Bowel: Stomach is within normal limits. Appendix appears normal. No evidence of bowel wall thickening, distention, or inflammatory changes. Vascular/Lymphatic: Aortic atherosclerosis. No enlarged abdominal or pelvic lymph nodes. Reproductive: Prostate is unremarkable. Other: No abdominal wall hernia or abnormality.  No abdominopelvic ascites. Musculoskeletal: No acute or significant osseous findings. IMPRESSION: Aortic atherosclerosis. No acute abnormality seen in the abdomen or pelvis. Aortic Atherosclerosis (ICD10-I70.0). Electronically Signed   By: Marijo Conception M.D.   On: 06/02/2020 08:55    Procedures Procedures   Medications Ordered in ED Medications  oxyCODONE-acetaminophen (PERCOCET/ROXICET) 5-325 MG per tablet 1 tablet (1 tablet Oral Given 06/01/20 2318)  morphine 4 MG/ML injection 4 mg (has no administration in time range)    ED Course  I have reviewed the triage vital signs and the nursing notes.  Pertinent labs & imaging results that were available during my care of the patient were reviewed by me and considered in my medical decision making (see chart for details).    MDM Rules/Calculators/A&P                          BP 130/89   Pulse 83   Temp 97.6 F (36.4 C) (Oral)   Resp 17   SpO2 96%   Final Clinical Impression(s) / ED Diagnoses Final diagnoses:  Chronic abdominal pain    Rx / DC Orders ED Discharge Orders         Ordered    omeprazole (PRILOSEC) 20 MG capsule  Daily        06/02/20 0923         8:23 AM Patient report acute on chronic abdominal discomfort.  He voiced concern for potential malignancy which worries him.  He request for CT scan of the abdomen pelvis.  At this time I have low suspicion for acute abdominal pathology present setting of previous colonoscopy showing tubular adenomas and  patient current concern, will obtain CT scan of abdomen pelvis.  9:20 AM CT scan of the abdomen pelvis demonstrated no acute finding.  Given the chronicity of his symptoms, I do not think he has any acute pathology warranting further work-up in the ED.  Mild elevations of his creatinine of 1.27.  Patient cannot tolerate p.o.  At this time I encourage patient to follow-up closely with his primary care provider for further care.  His last colonoscopy was approximately 9 years ago he may be due for a follow-up colonoscopy.  Patient was given return precaution.  For increased belching will prescribe prilosec.    Domenic Moras, PA-C 06/02/20 [place], [place], DO 06/02/20 [phone]

## 2020-06-12 ENCOUNTER — Other Ambulatory Visit: Payer: Self-pay | Admitting: Family Medicine

## 2020-06-12 DIAGNOSIS — I10 Essential (primary) hypertension: Secondary | ICD-10-CM

## 2020-06-15 NOTE — Telephone Encounter (Signed)
Needs appointment

## 2020-09-30 ENCOUNTER — Other Ambulatory Visit: Payer: Self-pay | Admitting: Family Medicine

## 2020-09-30 DIAGNOSIS — I10 Essential (primary) hypertension: Secondary | ICD-10-CM

## 2020-10-02 NOTE — Telephone Encounter (Signed)
Needs appointment

## 2020-10-12 NOTE — Telephone Encounter (Signed)
Called and lvm to call back and schedule appointment.

## 2020-11-04 ENCOUNTER — Other Ambulatory Visit: Payer: Self-pay | Admitting: Family Medicine

## 2020-11-04 DIAGNOSIS — I10 Essential (primary) hypertension: Secondary | ICD-10-CM

## 2020-12-14 ENCOUNTER — Other Ambulatory Visit: Payer: Self-pay | Admitting: Family Medicine

## 2020-12-14 DIAGNOSIS — I10 Essential (primary) hypertension: Secondary | ICD-10-CM

## 2020-12-19 IMAGING — DX CHEST - 2 VIEW
2 series · 2 of 2 positions shown · non-contrast
Comparison: 06/08/2016.

CLINICAL DATA: Abnormal breath sounds.

EXAM:
CHEST - 2 VIEW

[chest pa]
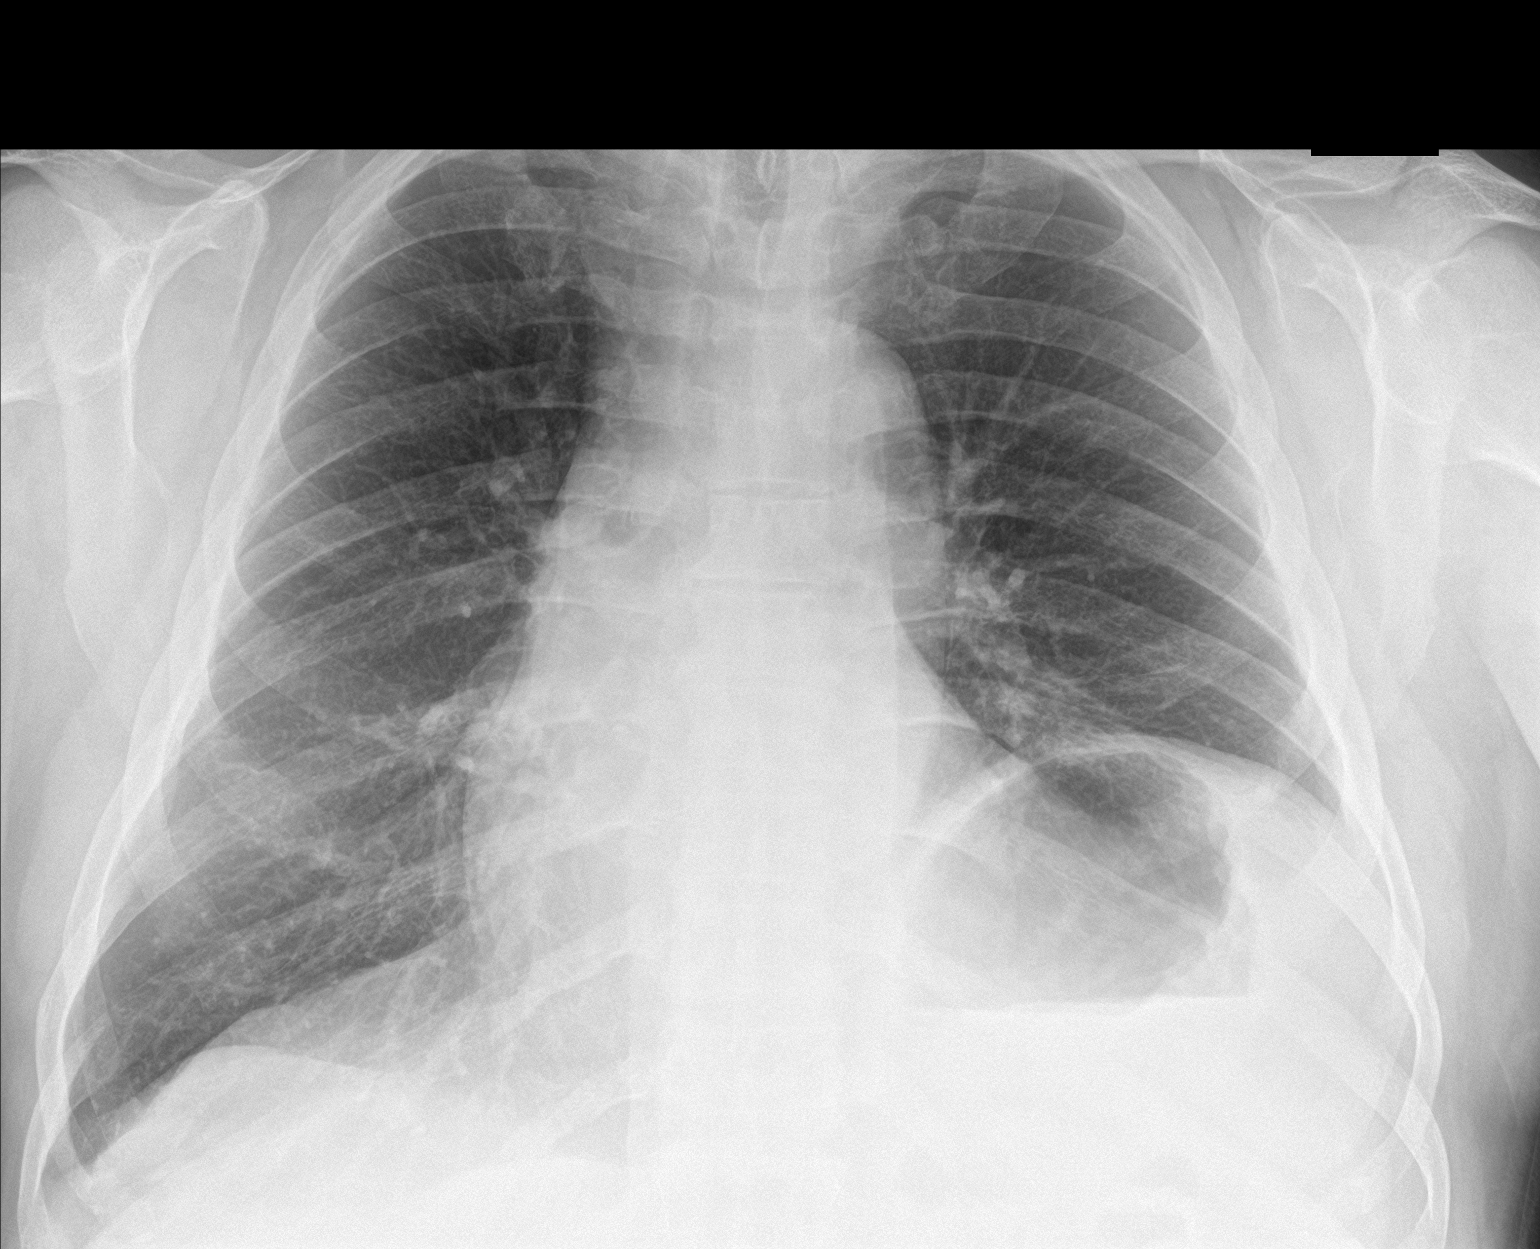

[chest lat]
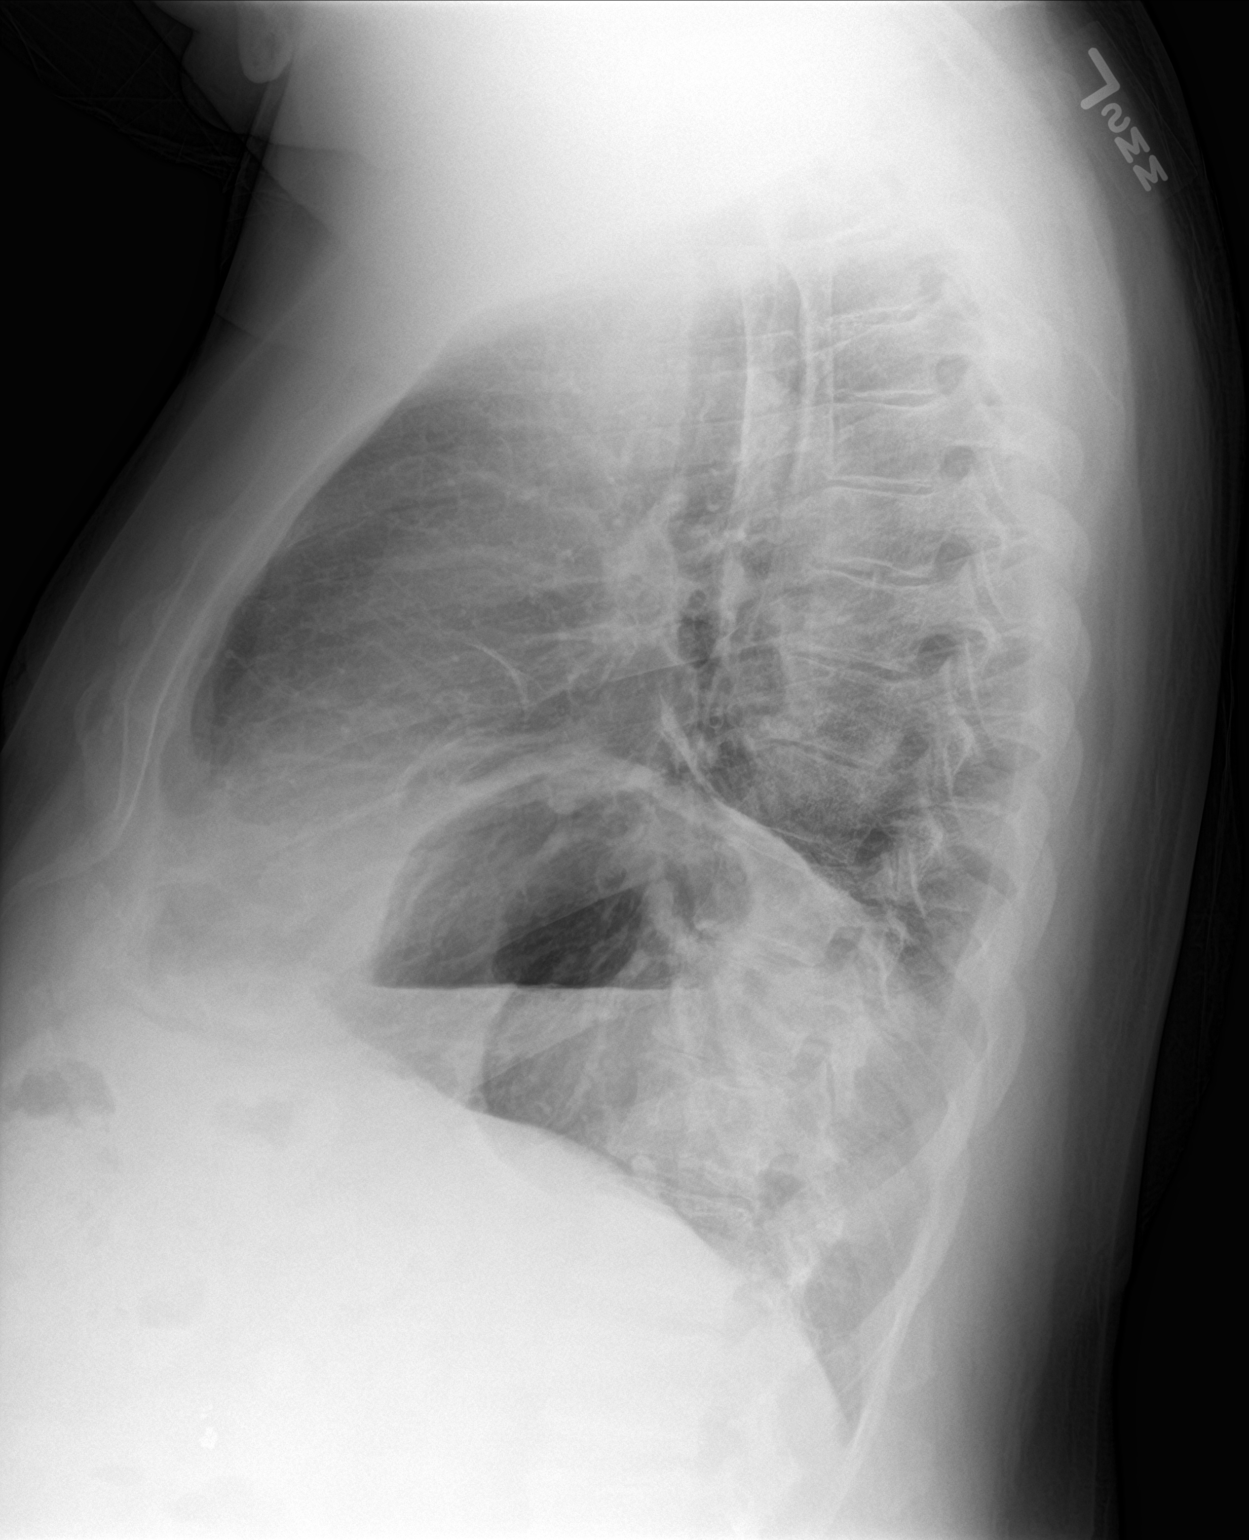

[2 of 2 positions shown; findings below may reference images not displayed]

FINDINGS: Mediastinum and hilar structures normal. Heart size stable. Stable
atelectasis/scarring left lung base with stable elevation left
hemidiaphragm. No pleural effusion or pneumothorax. No acute bony
abnormality. Degenerative change thoracic spine.
IMPRESSION: Stable atelectasis/scarring left lung base with stable elevation
left hemidiaphragm. No acute abnormality identified.

## 2020-12-25 IMAGING — CR CHEST - 2 VIEW
2 series · 2 of 2 positions shown · non-contrast
Comparison: 09/27/2018

CLINICAL DATA: Chest pain

EXAM:
CHEST - 2 VIEW

[chest pa]
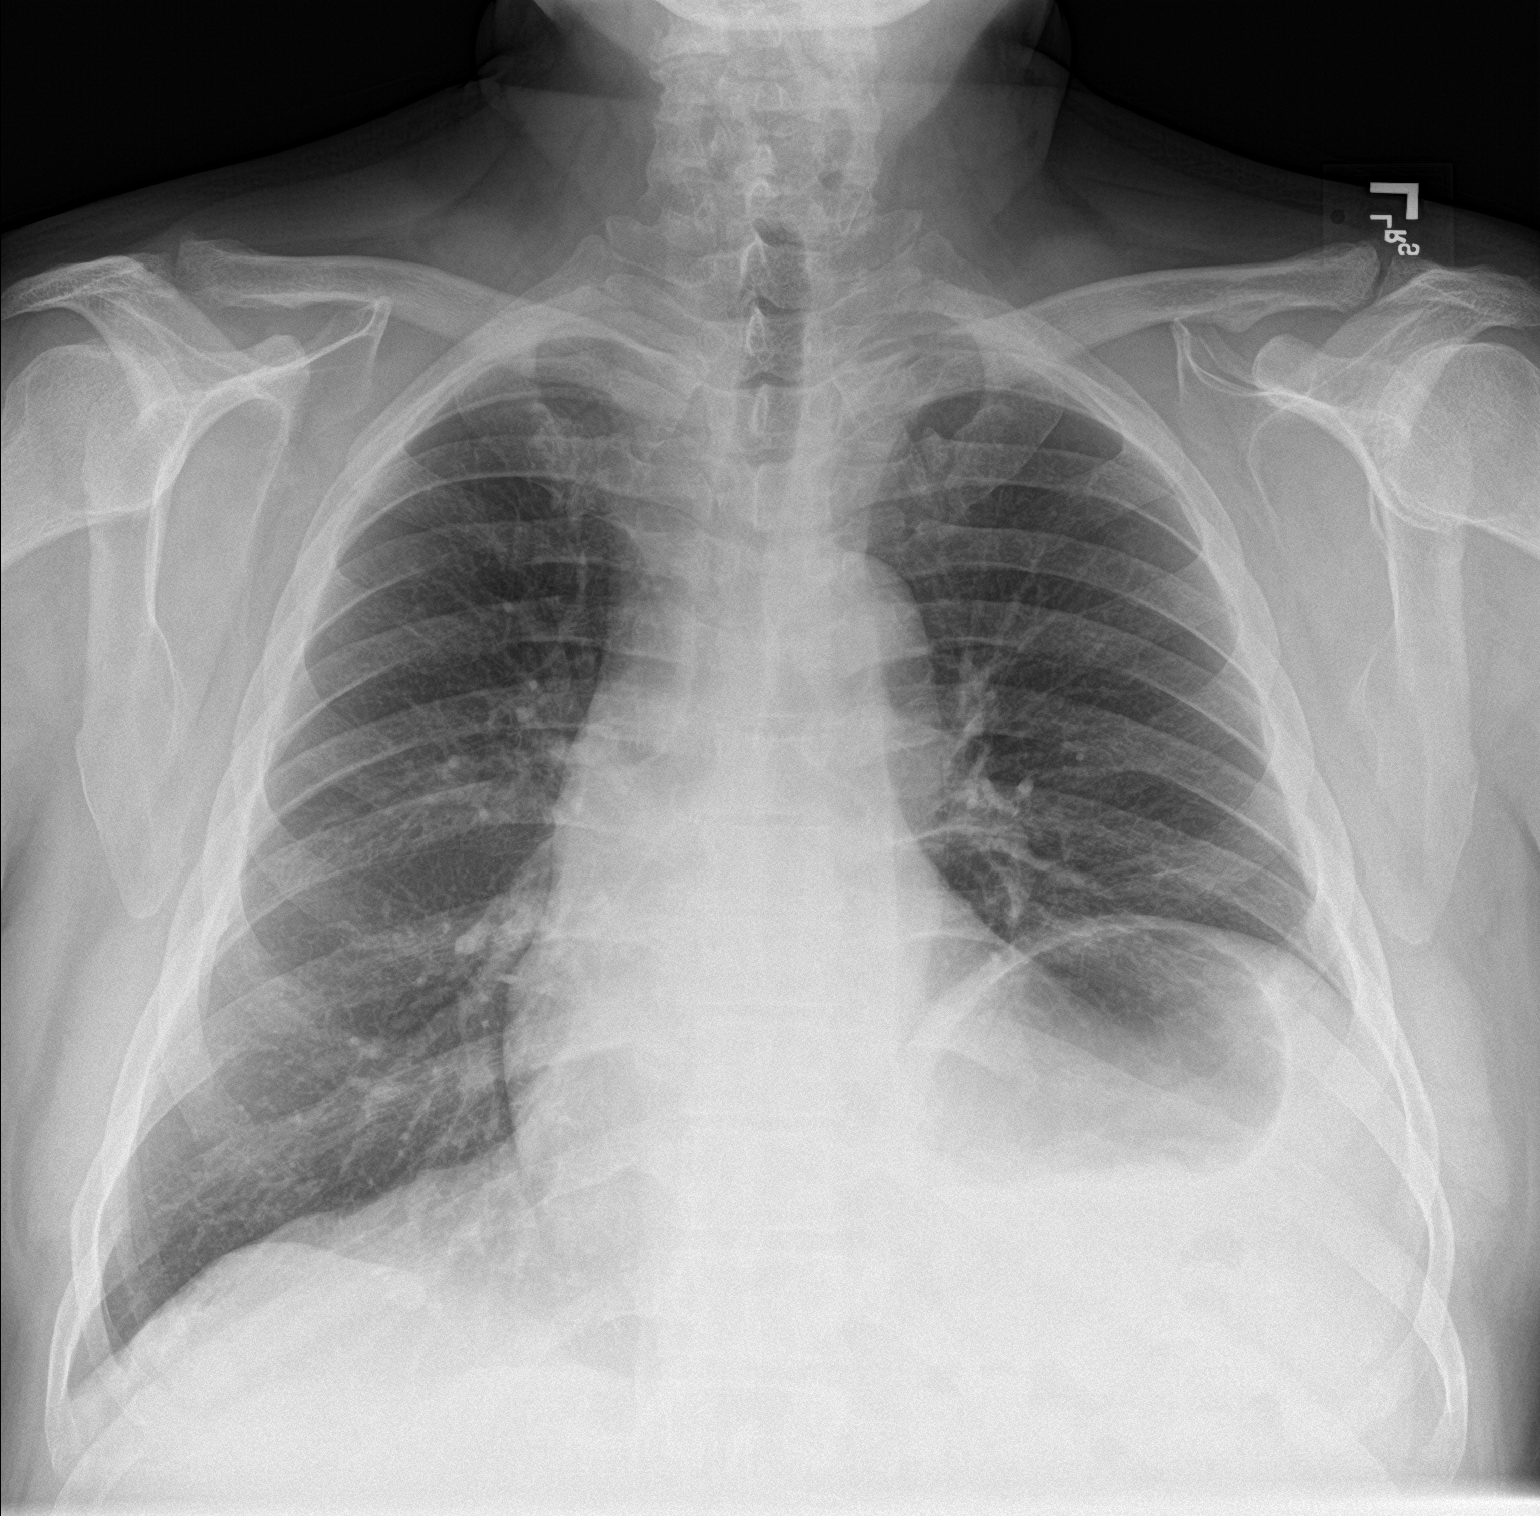

[chest lat]
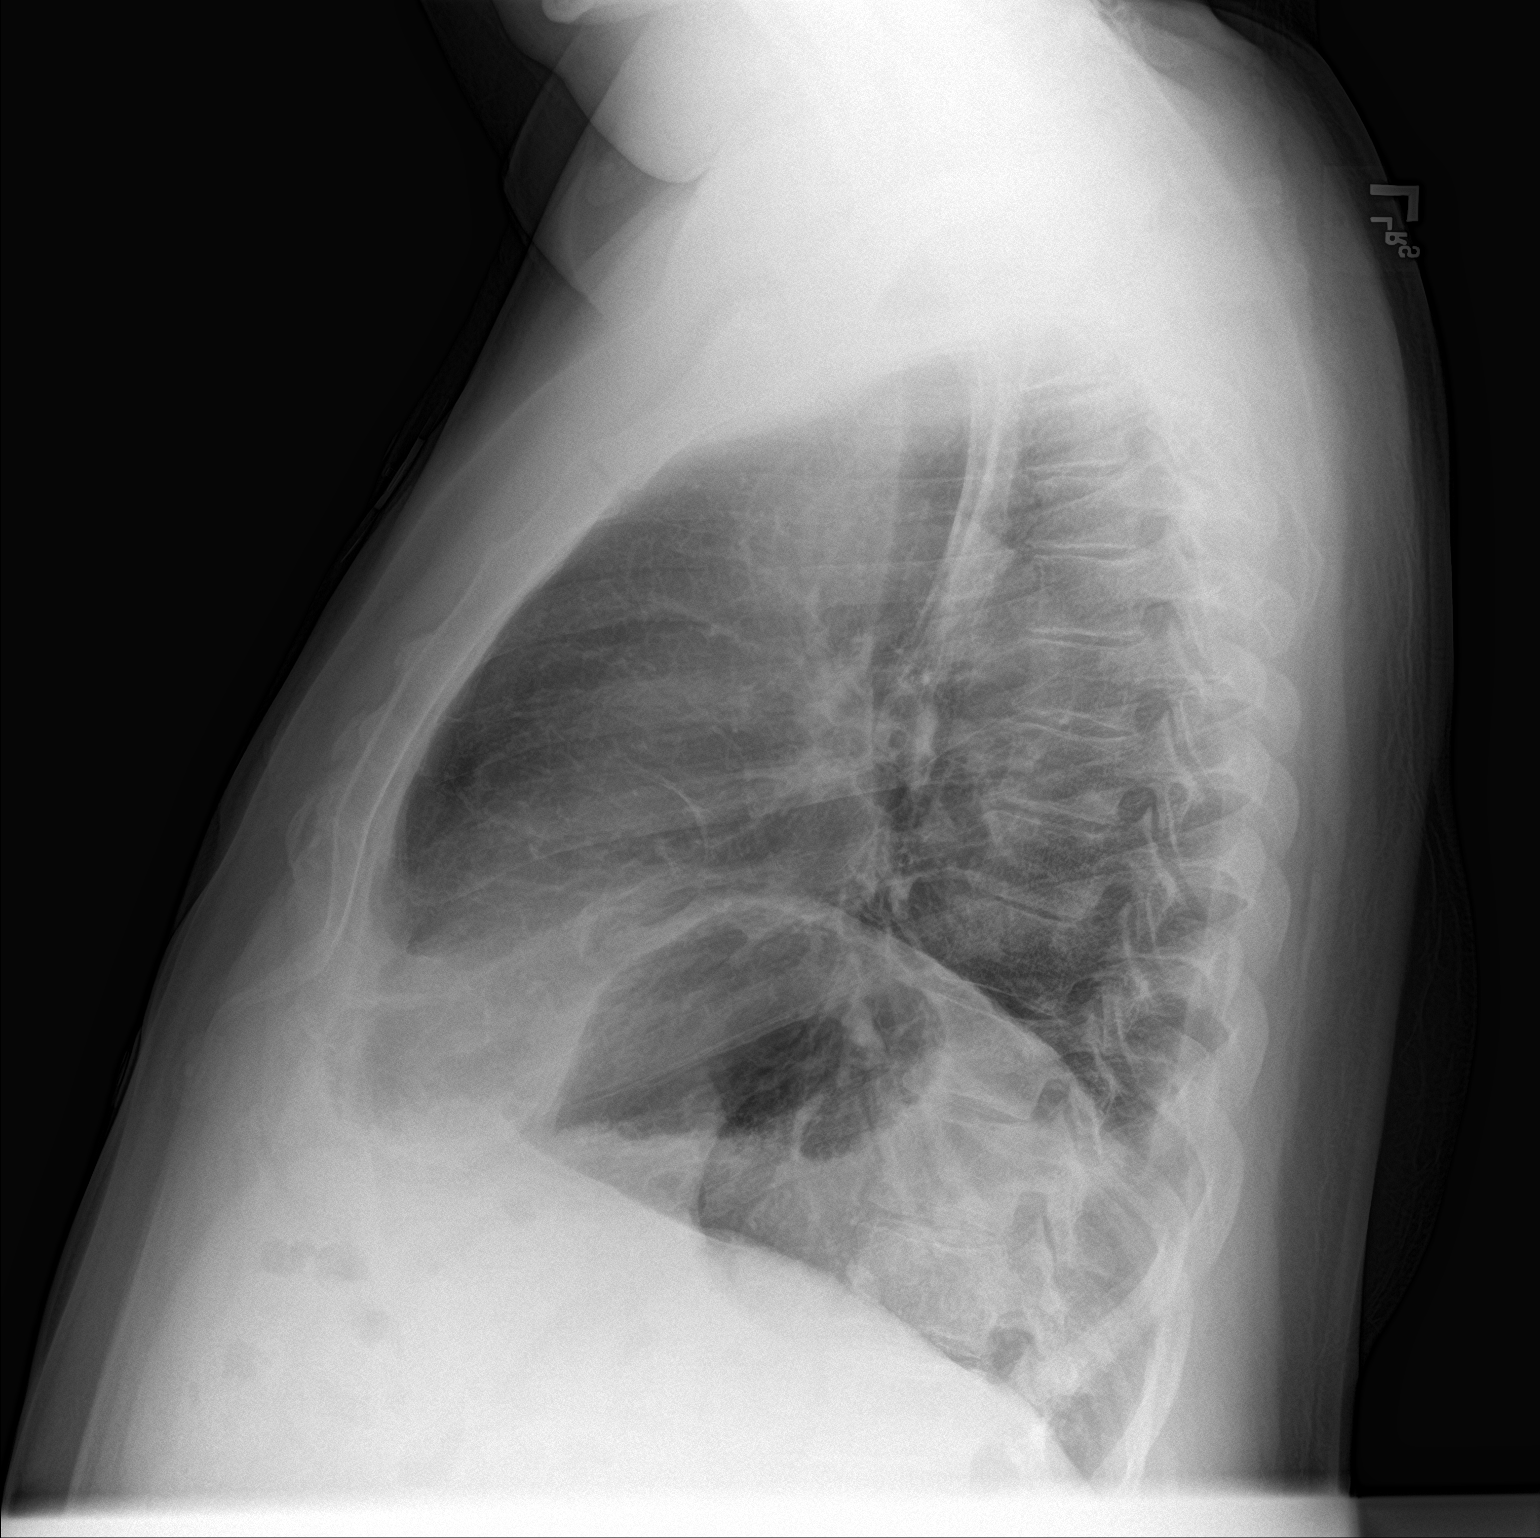

[2 of 2 positions shown; findings below may reference images not displayed]

FINDINGS: There is persistent elevation of the left hemidiaphragm. The cardiac
size is stable from prior study. There is no pneumothorax. There is
stable scarring versus atelectasis at the left lung base. There is
no acute osseous abnormality.
IMPRESSION: No active cardiopulmonary disease.

## 2021-01-13 ENCOUNTER — Other Ambulatory Visit: Payer: Self-pay | Admitting: Family Medicine

## 2021-01-13 DIAGNOSIS — I10 Essential (primary) hypertension: Secondary | ICD-10-CM

## 2021-02-25 ENCOUNTER — Other Ambulatory Visit: Payer: Self-pay | Admitting: Family Medicine

## 2021-02-25 DIAGNOSIS — I10 Essential (primary) hypertension: Secondary | ICD-10-CM

## 2021-02-27 NOTE — Telephone Encounter (Signed)
Patient needs office visit prior to any additional refills. Please call him to schedule

## 2021-03-15 NOTE — Progress Notes (Signed)
G   SUBJECTIVE:   CHIEF COMPLAINT / HPI:   Difficulty with urination, testicular pain- notes a long history of weak urinary stream, but in past 2 months he also notes urinary hesitancy and dribbling and has to start running water to urinate. Also has feeling of incomplete voiding and sometimes dysuria with urination. Dark colored urine sometimes, but no penile discharge or lesions. Not sexually active. No fevers or chills. Also notes trouble getting erections. Previously has had testicular pain sometimes but none in past 6 months. Does note suprapubic pain at times.  Chest pain- for the past few months, has happened 4x in the past month. Comes on when he is at rest, substernal, feels "tight," not sharp or stabbing, will last 15-20 minutes and then go away. Sometimes takes an aspirin which helps. Has had a cardiology stress test a long time ago but none recently (per chart review normal Myocardial perfusion in 2006). Takes aspirin daily and takes his blood pressure and cholesterol medications. Sometimes feels anxious during these episodes, sometimes sweating. No shortness of breath or palpitations. Nothin seems to make it come on. Not worse with movement.  T2DM- has not been taking his metformin. Wants to take less medications.  PERTINENT  PMH / PSH: T2DM, HLD, HTN  OBJECTIVE:   BP 125/86   Pulse 87   Ht 5\' 9"  (1.753 m)   Wt 241 lb 6.4 oz (109.5 kg)   SpO2 99%   BMI 35.65 kg/m   General: A&O, NAD HEENT: No sign of trauma, EOM grossly intact Cardiac: RRR, no m/r/g, no chest wall tenderness Respiratory: CTAB, normal WOB, no w/c/r GI: Soft, NTTP, non-distended, no guarding or rebound, no CVA tenderness GU: normal appearance of circumcised penis and scrotum, bilateral testes without masses or tenderness, no lesions on penis and urethra is patent without irritation Rectal: diffusely enlarged prostate, non-tender, not boggy, no masses or nodules on prostate Extremities: NTTP, no peripheral  edema. Neuro: Normal gait, moves all four extremities appropriately. Psych: Appropriate mood and affect  MS Mart Piggs present as chaperone for GU and rectal exam.  Bedside POCUS bladder scan 69 mL, 71 mL.  ASSESSMENT/PLAN:   Chest pain - based on coming at rest, substernal, crushing, concern for unstable angina - continue aspirin and statin daily - EKG today showing sinus arrythmia, possible PACs? But P with every QRS, no ST segment changes or T wave inversions, Q wave in lead III that appears different from prior - Referral to cardiology for stress testing, discussed with patient if episodes of chest pain occurs again to go to ED (last occurred one week ago) - diabetes control as below - repeat BMP and lipid panel at future lab visit (called and left VM to patient explaining could come for lab only visit as lab closed at end of appt yesterday)  Type II diabetes mellitus with neurological manifestations, uncontrolled ([place]) - A1c elevated today, patient states has not been taking metformin, discussed restarting 1000mg  BID - repeat BMP at future last visit as mildly elevated when last checked  BPH (benign prostatic hyperplasia) - based on LUTS and enlarged prostate on exam, suspect BPH - bedside bladder scan normal without signs of acute urinary retention - start flomax 0.4 mg daily - recommend discussion of checking PSA at future visit (did not check today due to prostate exam possibly causing false elevation) - follow up 2-4 weeks to reassess symptoms, consider referralto urology if not improving or for further assessment of erectile dysfunction  Other issues to address at future visit - Erectile dysfunction - anxiety/mood - chronic pain  Flu and COVID booster given today.  F/u 2-4 weeks to address other issues and close follow up.  Lenoria Chime, MD [place]

## 2021-03-16 ENCOUNTER — Other Ambulatory Visit: Payer: Self-pay

## 2021-03-16 ENCOUNTER — Ambulatory Visit (INDEPENDENT_AMBULATORY_CARE_PROVIDER_SITE_OTHER): Payer: Medicaid Other

## 2021-03-16 ENCOUNTER — Ambulatory Visit (HOSPITAL_COMMUNITY)
Admission: RE | Admit: 2021-03-16 | Discharge: 2021-03-16 | Disposition: A | Discharge status: Home / Self Care | Payer: Medicaid Other | Source: Ambulatory Visit | Attending: Family Medicine | Admitting: Family Medicine

## 2021-03-16 ENCOUNTER — Encounter: Payer: Self-pay | Admitting: Family Medicine

## 2021-03-16 ENCOUNTER — Ambulatory Visit (INDEPENDENT_AMBULATORY_CARE_PROVIDER_SITE_OTHER): Payer: Medicaid Other | Admitting: Family Medicine

## 2021-03-16 VITALS — BP 125/86 | HR 87 | Ht 69.0 in | Wt 241.4 lb

## 2021-03-16 DIAGNOSIS — Z23 Encounter for immunization: Secondary | ICD-10-CM | POA: Diagnosis present

## 2021-03-16 DIAGNOSIS — I10 Essential (primary) hypertension: Secondary | ICD-10-CM

## 2021-03-16 DIAGNOSIS — R3912 Poor urinary stream: Secondary | ICD-10-CM

## 2021-03-16 DIAGNOSIS — E119 Type 2 diabetes mellitus without complications: Secondary | ICD-10-CM | POA: Diagnosis present

## 2021-03-16 DIAGNOSIS — R079 Chest pain, unspecified: Secondary | ICD-10-CM | POA: Diagnosis not present

## 2021-03-16 DIAGNOSIS — E782 Mixed hyperlipidemia: Secondary | ICD-10-CM

## 2021-03-16 DIAGNOSIS — N401 Enlarged prostate with lower urinary tract symptoms: Secondary | ICD-10-CM

## 2021-03-16 DIAGNOSIS — R0789 Other chest pain: Secondary | ICD-10-CM | POA: Diagnosis present

## 2021-03-16 DIAGNOSIS — R300 Dysuria: Secondary | ICD-10-CM | POA: Diagnosis not present

## 2021-03-16 LAB — POCT GLYCOSYLATED HEMOGLOBIN (HGB A1C): HbA1c, POC (controlled diabetic range): 7.8 % — AB (ref 0.0–7.0)

## 2021-03-16 LAB — POCT URINALYSIS DIP (MANUAL ENTRY)
Bilirubin, UA: NEGATIVE
Blood, UA: NEGATIVE
Glucose, UA: NEGATIVE mg/dL
Ketones, POC UA: NEGATIVE mg/dL
Leukocytes, UA: NEGATIVE
Nitrite, UA: NEGATIVE
Protein Ur, POC: NEGATIVE mg/dL
Spec Grav, UA: 1.02 (ref 1.010–1.025)
Urobilinogen, UA: 0.2 E.U./dL
pH, UA: 6 (ref 5.0–8.0)

## 2021-03-16 MED ORDER — TAMSULOSIN HCL 0.4 MG PO CAPS: 0.4000 mg | ORAL_CAPSULE | Freq: Every day | ORAL | 3 refills | Status: DC

## 2021-03-16 NOTE — Patient Instructions (Addendum)
It was wonderful to see you today.  Please bring ALL of your medications with you to every visit.   Today we talked about:  - Restart your metformin 1000mg  twice daily to help with your diabetes control - I have referred you to cardiology for a stress test and cardiac monitor for your chest pain, if you have chest pain that comes on with similar symptoms and does not go away please go straight to the ED - We are starting flomax 0.4 mg daily for your urinary symptoms and will refer to urology if no improvement  Please make a follow up appointment with PCP to discuss further concerns.   Thank you for choosing [name].   Please call [phone] with any questions about today's appointment.  Please be sure to schedule follow up at the front  desk before you leave today.   [name], MD  Family Medicine

## 2021-03-17 ENCOUNTER — Ambulatory Visit (HOSPITAL_COMMUNITY)
Admission: RE | Admit: 2021-03-17 | Discharge: 2021-03-17 | Disposition: A | Discharge status: Home / Self Care | Payer: Medicaid Other | Source: Ambulatory Visit | Attending: Family Medicine | Admitting: Family Medicine

## 2021-03-17 DIAGNOSIS — N400 Enlarged prostate without lower urinary tract symptoms: Secondary | ICD-10-CM | POA: Insufficient documentation

## 2021-03-17 NOTE — Assessment & Plan Note (Addendum)
-   based on LUTS and enlarged prostate on exam, suspect BPH - bedside bladder scan normal without signs of acute urinary retention - start flomax 0.4 mg daily - recommend discussion of checking PSA at future visit (did not check today due to prostate exam possibly causing false elevation) - follow up 2-4 weeks to reassess symptoms, consider referralto urology if not improving or for further assessment of erectile dysfunction

## 2021-03-17 NOTE — Assessment & Plan Note (Signed)
-   A1c elevated today, patient states has not been taking metformin, discussed restarting 1000mg  BID - repeat BMP at future last visit as mildly elevated when last checked

## 2021-03-17 NOTE — Assessment & Plan Note (Signed)
-   based on coming at rest, substernal, crushing, concern for unstable angina - continue aspirin and statin daily - EKG today showing sinus arrythmia, possible PACs? But P with every QRS, no ST segment changes or T wave inversions, Q wave in lead III that appears different from prior - Referral to cardiology for stress testing, discussed with patient if episodes of chest pain occurs again to go to ED (last occurred one week ago) - diabetes control as below - repeat BMP and lipid panel at future lab visit (called and left VM to patient explaining could come for lab only visit as lab closed at end of appt yesterday)

## 2021-03-22 ENCOUNTER — Other Ambulatory Visit: Payer: Self-pay

## 2021-03-22 ENCOUNTER — Other Ambulatory Visit: Payer: Medicaid Other

## 2021-03-22 DIAGNOSIS — E782 Mixed hyperlipidemia: Secondary | ICD-10-CM

## 2021-03-22 DIAGNOSIS — I10 Essential (primary) hypertension: Secondary | ICD-10-CM

## 2021-03-23 LAB — LIPID PANEL
Chol/HDL Ratio: 4.8 ratio (ref 0.0–5.0)
Cholesterol, Total: 143 mg/dL (ref 100–199)
HDL: 30 mg/dL — ABNORMAL LOW (ref >39)
LDL Chol Calc (NIH): 36 mg/dL (ref 0–99)
Triglycerides: 552 mg/dL (ref 0–149)
VLDL Cholesterol Cal: 77 mg/dL — ABNORMAL HIGH (ref 5–40)

## 2021-03-23 LAB — BASIC METABOLIC PANEL
BUN/Creatinine Ratio: 14 (ref 10–24)
BUN: 15 mg/dL (ref 8–27)
CO2: 26 mmol/L (ref 20–29)
Calcium: 8.8 mg/dL (ref 8.6–10.2)
Chloride: 101 mmol/L (ref 96–106)
Creatinine, Ser: 1.11 mg/dL (ref 0.76–1.27)
Glucose: 197 mg/dL — ABNORMAL HIGH (ref 70–99)
Potassium: 4.5 mmol/L (ref 3.5–5.2)
Sodium: 142 mmol/L (ref 134–144)
eGFR: 74 mL/min/{1.73_m2} (ref >59)

## 2021-03-30 ENCOUNTER — Other Ambulatory Visit: Payer: Self-pay | Admitting: Family Medicine

## 2021-03-30 DIAGNOSIS — I10 Essential (primary) hypertension: Secondary | ICD-10-CM

## 2021-04-06 ENCOUNTER — Encounter: Payer: Self-pay | Admitting: Family Medicine

## 2021-04-19 ENCOUNTER — Ambulatory Visit: Payer: Medicaid Other | Admitting: Cardiology

## 2021-05-09 NOTE — Progress Notes (Signed)
Cardiology Office Note:   Date:  05/10/2021  NAME:  [name]    MRN: [id number] DOB:  05/08/1955   PCP:  Alcus Dad, MD  Cardiologist:  None  Electrophysiologist:  None   Referring MD: Alcus Dad, MD   Chief Complaint  Patient presents with   Chest Pain    History of Present Illness:   [name] is a 66 y.o. male with a hx of obesity, HTN, HLD, DM who is being seen today for the evaluation of chest pain at the request of Alcus Dad, MD. he reports he has had tightness in his chest for years.  Symptoms can occur randomly.  Described as a sharp sensation across his chest.  Also reported shooting pain.  Can occur with exertion but also occurs at rest.  Symptoms can resolve randomly.  They can last seconds to minutes.  They can also stop with activity.  He also reports exertional shortness of breath over the past 1 year.  Activities such as going to the bathroom does get him winded.  [name] obese with a BMI of 36.  [name] diabetic with an A1c of 7.8.  Triglycerides and cholesterol are not controlled.  He does suffer from depression.  He reports medications are not working.  He also describes chronic pain.  Reports pain in his abdomen.  He also reports pain in his shoulders.  [name] on Crestor 20 mg daily.  Most recent lipids are not controlled.  Triglycerides are a big issue.  We discussed increasing his Crestor and adding Vascepa.  He does have hypertension but this is controlled with medication.  BP 132/80.  EKG demonstrates sinus rhythm with no acute ischemic changes or evidence of infarction.  He reports his mother had a stroke.  He does not smoke.  No alcohol or drugs are reported.  [name] not married.  No children.  He reports he is very depressed and concerned he might die.  I did discuss that we need to pursue further testing.  He also needs updated thyroid panel.  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 meds   Increased urinary frequency    Kidney stones 08-10-11   once in past, none recent   Methylprednisolone-induced hypoxia 08/23/2011   Neuromuscular disorder ([place]) 08-10-11   "burning/tingling sensation In feet"   Obesity    Obesity, Class II, BMI 35-39.9, with comorbidity  06/29/2006   OSA (obstructive sleep apnea)    "couldn't deal w/CPAP" (06/08/2016)   Preoperative clearance    RUQ pain 06/09/2016   Shortness of breath 08-10-11   with increased activity only   Syncope and collapse 06/09/2016   Syncope and collapse 06/09/2016   Type II diabetes mellitus ([place])    Umbilical hernia     Past Surgical History: Past Surgical History:  Procedure Laterality Date   CATARACT EXTRACTION W/ INTRAOCULAR LENS  IMPLANT, BILATERAL Bilateral 08/10/2011 - ~ 2014   "right - left"   CHOLECYSTECTOMY N/A 06/10/2016   Procedure: LAPAROSCOPIC CHOLECYSTECTOMY WITH INTRAOPERATIVE CHOLANGIOGRAM;  Surgeon: Erroll Luna, MD;  Location: [facility];  Service: General;  Laterality: N/A;    COLONOSCOPY W/ BIOPSIES AND POLYPECTOMY  2013   HERNIA REPAIR     LIPOMA EXCISION Right 10/18/2018   Procedure: EXCISION RIGHT BACK  LIPOMA X 2;  Surgeon: Erroll Luna, MD;  Location: [facility];  Service: General;  Laterality: Right;   VENTRAL HERNIA REPAIR  08/22/2011   Procedure: HERNIA REPAIR VENTRAL ADULT;  Surgeon: Adin Hector, MD;  Location: [facility];  Service: General;  Laterality: N/A;  incarcerated    Current Medications: Current Meds  Medication Sig   amitriptyline (ELAVIL) 10 MG tablet Take 1 tablet (10 mg total) by mouth at bedtime.   amLODipine (NORVASC) 10 MG tablet Take 1 tablet by mouth daily   aspirin 81 MG EC tablet TAKE 1 TABLET (81 MG TOTAL) BY MOUTH DAILY.   divalproex (DEPAKOTE ER) 500 MG 24 hr tablet Take 500 mg by mouth at bedtime.   hydrOXYzine (ATARAX/VISTARIL) 10 MG tablet Take 1 tablet (10 mg total) by mouth 3 (three) times daily as needed.   hydrOXYzine (ATARAX/VISTARIL) 25 MG tablet Take 1 tablet (25 mg total) by mouth every 6 (six) hours.   icosapent Ethyl (VASCEPA) 1 g capsule Take 2 capsules (2 g total) by mouth 2 (two) times daily.   Melatonin 5 MG CAPS Take 1 capsule (5 mg total) by mouth at bedtime.   metFORMIN (GLUCOPHAGE) 1000 MG tablet TAKE 1 TABLET (1,000 MG TOTAL) BY MOUTH 2 (TWO) TIMES DAILY WITH A MEAL.   metoprolol tartrate (LOPRESSOR) 50 MG tablet Take 1 tablet by mouth once for procedure.   omeprazole (PRILOSEC) 20 MG capsule Take 1 capsule (20 mg total) by mouth daily.   tamsulosin (FLOMAX) 0.4 MG CAPS capsule Take 1 capsule (0.4 mg total) by mouth daily.   traZODone (DESYREL) 50 MG tablet Take 1 tablet (50 mg total) by mouth at bedtime.   venlafaxine XR (EFFEXOR-XR) 150 MG 24 hr capsule Take 150 mg by mouth 2 (two) times daily.   [DISCONTINUED] rosuvastatin (CRESTOR) 20 MG tablet TAKE 1 TABLET BY MOUTH EVERY DAY     Allergies:    Methylprednisolone sodium succ   Social History: Social History   Socioeconomic  History   Marital status: Single    Spouse name: Not on file   Number of children: 0   Years of education: Not on file   Highest education level: Not on file  Occupational History   Occupation: Unemployed   Occupation: Disabled  Tobacco Use   Smoking status: Never   Smokeless tobacco: Never  Substance and Sexual Activity   Alcohol use: No   Drug use: No   Sexual activity: Never  Other Topics Concern   Not on file  Social History Narrative   Daily caffeine    Social Determinants of Health  Financial Resource Strain: Not on file  Food Insecurity: Not on file  Transportation Needs: Not on file  Physical Activity: Not on file  Stress: Not on file  Social Connections: Not on file     Family History: The patient's family history includes Diabetes in his mother; Hyperlipidemia in his father and mother; Stroke in his mother. There is no history of Colon cancer.  ROS:   All other ROS reviewed and negative. Pertinent positives noted in the HPI.     EKGs/Labs/Other Studies Reviewed:   The following studies were personally reviewed by me today:  EKG:  EKG is ordered today.  The ekg ordered today demonstrates sinus rhythm heart rate 99, no acute ischemic changes or evidence of infarction, and was personally reviewed by me.   Recent Labs: 06/01/2020: ALT 32; Hemoglobin 14.9; Platelets 280 03/22/2021: BUN 15; Creatinine, Ser 1.11; Potassium 4.5; Sodium 142   Recent Lipid Panel    Component Value Date/Time   CHOL 143 03/22/2021 1505   TRIG 552 (HH) 03/22/2021 1505   HDL 30 (L) 03/22/2021 1505   CHOLHDL 4.8 03/22/2021 1505   CHOLHDL 4.5 06/09/2016 0542   VLDL 48 (H) 06/09/2016 0542   LDLCALC 36 03/22/2021 1505   LDLDIRECT 57 07/15/2010 2059    Physical Exam:   VS:  BP 128/84    Pulse 99    Ht 5\' 9"  (1.753 m)    Wt 246 lb 9.6 oz (111.9 kg)    SpO2 97%    BMI 36.42 kg/m    Wt Readings from Last 3 Encounters:  05/10/21 246 lb 9.6 oz (111.9 kg)  03/16/21 241 lb 6.4 oz  (109.5 kg)  11/15/19 227 lb (103 kg)    General: Well nourished, well developed, in no acute distress Head: Atraumatic, normal size  Eyes: PEERLA, EOMI  Neck: Supple, no JVD Endocrine: No thryomegaly Cardiac: Normal S1, S2; RRR; no murmurs, rubs, or gallops Lungs: Clear to auscultation bilaterally, no wheezing, rhonchi or rales  Abd: Soft, nontender, no hepatomegaly  Ext: No edema, pulses 2+ Musculoskeletal: No deformities, BUE and BLE strength normal and equal Skin: Warm and dry, no rashes   Neuro: Alert and oriented to person, place, time, and situation, CNII-XII grossly intact, no focal deficits  Psych: Normal mood and affect   ASSESSMENT:   DANNEL RAFTER is a 66 y.o. male who presents for the following: 1. Precordial pain   2. SOB (shortness of breath)   3. Mixed hyperlipidemia   4. Obesity (BMI 30-39.9)   5. HYPERTENSION, BENIGN SYSTEMIC     PLAN:   1. Precordial pain -Possible cardiac chest pain.  CV risk factors include diabetes obesity and hypertension.  Symptoms are really difficult to delineate.  He does report he is depressed.  I suspect depression could be causing his symptoms.  To further elucidate his symptoms I recommended a coronary CTA.  He will take 100 mg of metoprolol tartrate the day of the scan.  I would like to obtain an echocardiogram.  He does report exertional shortness of breath.  His EKG in office shows no acute ischemic changes or evidence of infarction.  This is reassuring.  We will check a BMP today.  We will also check a TSH.  BNP will help exclude congestive heart failure.  2. SOB (shortness of breath) -Euvolemic on examination.  We will obtain a BNP and echocardiogram to exclude congestive heart failure.  Suspect symptoms could be deconditioning.  Coronary CTA as above to rule  out obstructive CAD.  3. Mixed hyperlipidemia -Triglycerides seem to be the issue.  I recommended better glycemic control.  Increase Crestor to 40 mg daily.  Add Vascepa 2 g  twice daily.  4. Obesity (BMI 30-39.9) -Diet and exercise are recommended.  5. HYPERTENSION, BENIGN SYSTEMIC -No change to medications.  BP controlled.  Disposition: Return in about 3 months (around 08/08/2021).  Medication Adjustments/Labs and Tests Ordered: Current medicines are reviewed at length with the patient today.  Concerns regarding medicines are outlined above.  Orders Placed This Encounter  Procedures   CT CORONARY MORPH W/CTA COR W/SCORE W/CA W/CM &/OR WO/CM   Basic metabolic panel   Brain natriuretic peptide   TSH   ECHOCARDIOGRAM COMPLETE   Meds ordered this encounter  Medications   metoprolol tartrate (LOPRESSOR) 50 MG tablet    Sig: Take 1 tablet by mouth once for procedure.    Dispense:  1 tablet    Refill:  0   rosuvastatin (CRESTOR) 40 MG tablet    Sig: Take 1 tablet (40 mg total) by mouth daily.    Dispense:  90 tablet    Refill:  1   icosapent Ethyl (VASCEPA) 1 g capsule    Sig: Take 2 capsules (2 g total) by mouth 2 (two) times daily.    Dispense:  120 capsule    Refill:  2    Patient Instructions  Medication Instructions:  Take Metoprolol 100 mg two hours before CT when scheduled Increase Crestor to 40 mg daily  START Vascepa 2 grams twice daily   *If you need a refill on your cardiac medications before your next appointment, please call your pharmacy*   Lab Work: BMET, TSH, BNP today   If you have labs (blood work) drawn today and your tests are completely normal, you will receive your results only by: [place] (if you have MyChart) OR A paper copy in the mail If you have any lab test that is abnormal or we need to change your treatment, we will call you to review the results.   Testing/Procedures: Your physician has requested that you have cardiac CT. Cardiac computed tomography (CT) is a painless test that uses an x-ray machine to take clear, detailed pictures of your heart. For further information please visit  [email or web address]. Please follow instruction sheet as given.  Echocardiogram - Your physician has requested that you have an echocardiogram. Echocardiography is a painless test that uses sound waves to create images of your heart. It provides your doctor with information about the size and shape of your heart and how well your hearts chambers and valves are working. This procedure takes approximately one hour. There are no restrictions for this procedure. This will be performed at either our [facility] location - [address] [place] location [facility] 2nd floor.    Follow-Up: At [facility], you and your health needs are our priority.  As part of our continuing mission to provide you with exceptional heart care, we have created designated Provider Care Teams.  These Care Teams include your primary Cardiologist (physician) and Advanced Practice Providers (APPs -  Physician Assistants and Nurse Practitioners) who all work together to provide you with the care you need, when you need it.  We recommend signing up for the patient portal called "MyChart".  Sign up information is provided on this After Visit Summary.  MyChart is used to connect with patients for Virtual Visits (Telemedicine).  Patients are  able to view lab/test results, encounter notes, upcoming appointments, etc.  Non-urgent messages can be sent to your provider as well.   To learn more about what you can do with MyChart, go to [email or web address].    Your next appointment:   3 month(s)  The format for your next appointment:   In Person  Provider:   Eleonore Chiquito, MD    Other Instructions   Your cardiac CT will be scheduled at one of the below locations:   [facility] [address] [place], [place] [address] [phone]  If scheduled at [facility], please arrive at the [facility] main entrance (entrance A) of [facility] 30 minutes prior to  test start time. You can use the FREE valet parking offered at the main entrance (encouraged to control the heart rate for the test) Proceed to the [facility] Radiology Department (first floor) to check-in and test prep.  Please follow these instructions carefully (unless otherwise directed):  Hold all erectile dysfunction medications at least 3 days (72 hrs) prior to test.  On the Night Before the Test: Be sure to Drink plenty of water. Do not consume any caffeinated/decaffeinated beverages or chocolate 12 hours prior to your test. Do not take any antihistamines 12 hours prior to your test.  On the Day of the Test: Drink plenty of water until 1 hour prior to the test. Do not eat any food 4 hours prior to the test. You may take your regular medications prior to the test.  Take metoprolol (Lopressor) two hours prior to test. HOLD Furosemide/Hydrochlorothiazide morning of the test.      After the Test: Drink plenty of water. After receiving IV contrast, you may experience a mild flushed feeling. This is normal. On occasion, you may experience a mild rash up to 24 hours after the test. This is not dangerous. If this occurs, you can take Benadryl 25 mg and increase your fluid intake. If you experience trouble breathing, this can be serious. If it is severe call 911 IMMEDIATELY. If it is mild, please call our office. If you take any of these medications: Glipizide/Metformin, Avandament, Glucavance, please do not take 48 hours after completing test unless otherwise instructed.  Please allow 2-4 weeks for scheduling of routine cardiac CTs. Some insurance companies require a pre-authorization which may delay scheduling of this test.   For non-scheduling related questions, please contact the cardiac imaging nurse navigator should you have any questions/concerns: Marchia Bond, Cardiac Imaging Nurse Navigator Gordy Clement, Cardiac Imaging Nurse Navigator [facility] Direct Office Dial: [phone]   For scheduling needs, including cancellations and rescheduling, please call [place], [phone].      Signed, Addison Naegeli. Audie Box, MD, [place]  [address], [place], [place] [address] [phone]  05/10/2021 4:43 PM

## 2021-05-10 ENCOUNTER — Encounter: Payer: Self-pay | Admitting: Cardiovascular Disease

## 2021-05-10 ENCOUNTER — Ambulatory Visit (INDEPENDENT_AMBULATORY_CARE_PROVIDER_SITE_OTHER): Payer: Medicaid Other | Admitting: Cardiovascular Disease

## 2021-05-10 ENCOUNTER — Other Ambulatory Visit: Payer: Self-pay

## 2021-05-10 VITALS — BP 128/84 | HR 99 | Ht 69.0 in | Wt 246.6 lb

## 2021-05-10 DIAGNOSIS — R072 Precordial pain: Secondary | ICD-10-CM | POA: Diagnosis not present

## 2021-05-10 DIAGNOSIS — E782 Mixed hyperlipidemia: Secondary | ICD-10-CM

## 2021-05-10 DIAGNOSIS — I10 Essential (primary) hypertension: Secondary | ICD-10-CM | POA: Diagnosis not present

## 2021-05-10 DIAGNOSIS — E669 Obesity, unspecified: Secondary | ICD-10-CM | POA: Diagnosis not present

## 2021-05-10 DIAGNOSIS — R0602 Shortness of breath: Secondary | ICD-10-CM

## 2021-05-10 MED ORDER — ICOSAPENT ETHYL 1 G PO CAPS: 2.0000 g | ORAL_CAPSULE | Freq: Two times a day (BID) | ORAL | 2 refills | Status: DC

## 2021-05-10 MED ORDER — ROSUVASTATIN CALCIUM 40 MG PO TABS: 40.0000 mg | ORAL_TABLET | Freq: Every day | ORAL | 1 refills | Status: DC

## 2021-05-10 MED ORDER — METOPROLOL TARTRATE 50 MG PO TABS: ORAL_TABLET | ORAL | 0 refills | Status: DC

## 2021-05-10 NOTE — Addendum Note (Signed)
Addended by: Dionicio Stall E on: 05/10/2021 05:03 PM   Modules accepted: Orders

## 2021-05-10 NOTE — Patient Instructions (Addendum)
Medication Instructions:  Take Metoprolol 100 mg two hours before CT when scheduled Increase Crestor to 40 mg daily  START Vascepa 2 grams twice daily   *If you need a refill on your cardiac medications before your next appointment, please call your pharmacy*   Lab Work: BMET, TSH, BNP today   If you have labs (blood work) drawn today and your tests are completely normal, you will receive your results only by: [place] (if you have MyChart) OR A paper copy in the mail If you have any lab test that is abnormal or we need to change your treatment, we will call you to review the results.   Testing/Procedures: Your physician has requested that you have cardiac CT. Cardiac computed tomography (CT) is a painless test that uses an x-ray machine to take clear, detailed pictures of your heart. For further information please visit [email or web address]. Please follow instruction sheet as given.  Echocardiogram - Your physician has requested that you have an echocardiogram. Echocardiography is a painless test that uses sound waves to create images of your heart. It provides your doctor with information about the size and shape of your heart and how well your hearts chambers and valves are working. This procedure takes approximately one hour. There are no restrictions for this procedure. This will be performed at either our [facility] location - [address], [place] location [facility] 2nd floor.    Follow-Up: At [facility], you and your health needs are our priority.  As part of our continuing mission to provide you with exceptional heart care, we have created designated Provider Care Teams.  These Care Teams include your primary Cardiologist (physician) and Advanced Practice Providers (APPs -  Physician Assistants and Nurse Practitioners) who all work together to provide you with the care you need, when you need it.  We recommend signing up for the  patient portal called "MyChart".  Sign up information is provided on this After Visit Summary.  MyChart is used to connect with patients for Virtual Visits (Telemedicine).  Patients are able to view lab/test results, encounter notes, upcoming appointments, etc.  Non-urgent messages can be sent to your provider as well.   To learn more about what you can do with MyChart, go to [email or web address].    Your next appointment:   3 month(s)  The format for your next appointment:   In Person  Provider:   Eleonore Chiquito, MD    Other Instructions   Your cardiac CT will be scheduled at one of the below locations:   [facility] [address] [place], [place] [address] [phone]  If scheduled at [facility], please arrive at the [facility] main entrance (entrance A) of [facility] 30 minutes prior to test start time. You can use the FREE valet parking offered at the main entrance (encouraged to control the heart rate for the test) Proceed to the [facility] Radiology Department (first floor) to check-in and test prep.  Please follow these instructions carefully (unless otherwise directed):  Hold all erectile dysfunction medications at least 3 days (72 hrs) prior to test.  On the Night Before the Test: Be sure to Drink plenty of water. Do not consume any caffeinated/decaffeinated beverages or chocolate 12 hours prior to your test. Do not take any antihistamines 12 hours prior to your test.  On the Day of the Test: Drink plenty of water until 1 hour prior to the test.  Do not eat any food 4 hours prior to the test. You may take your regular medications prior to the test.  Take metoprolol (Lopressor) two hours prior to test. HOLD Furosemide/Hydrochlorothiazide morning of the test.      After the Test: Drink plenty of water. After receiving IV contrast, you may experience a mild flushed feeling. This is normal. On occasion, you may experience a mild  rash up to 24 hours after the test. This is not dangerous. If this occurs, you can take Benadryl 25 mg and increase your fluid intake. If you experience trouble breathing, this can be serious. If it is severe call 911 IMMEDIATELY. If it is mild, please call our office. If you take any of these medications: Glipizide/Metformin, Avandament, Glucavance, please do not take 48 hours after completing test unless otherwise instructed.  Please allow 2-4 weeks for scheduling of routine cardiac CTs. Some insurance companies require a pre-authorization which may delay scheduling of this test.   For non-scheduling related questions, please contact the cardiac imaging nurse navigator should you have any questions/concerns: Marchia Bond, Cardiac Imaging Nurse Navigator Gordy Clement, Cardiac Imaging Nurse Navigator [facility] Direct Office Dial: [phone]   For scheduling needs, including cancellations and rescheduling, please call [place], [phone].

## 2021-05-11 LAB — BASIC METABOLIC PANEL
BUN/Creatinine Ratio: 12 (ref 10–24)
BUN: 12 mg/dL (ref 8–27)
CO2: 28 mmol/L (ref 20–29)
Calcium: 8.7 mg/dL (ref 8.6–10.2)
Chloride: 100 mmol/L (ref 96–106)
Creatinine, Ser: 1.03 mg/dL (ref 0.76–1.27)
Glucose: 228 mg/dL — ABNORMAL HIGH (ref 70–99)
Potassium: 4.5 mmol/L (ref 3.5–5.2)
Sodium: 140 mmol/L (ref 134–144)
eGFR: 81 mL/min/{1.73_m2} (ref >59)

## 2021-05-11 LAB — TSH: TSH: 3.83 u[IU]/mL (ref 0.450–4.500)

## 2021-05-11 LAB — BRAIN NATRIURETIC PEPTIDE: BNP: 13.7 pg/mL (ref 0.0–100.0)

## 2021-05-13 ENCOUNTER — Encounter: Payer: Self-pay | Admitting: *Deleted

## 2021-05-19 ENCOUNTER — Telehealth: Payer: Self-pay

## 2021-05-19 NOTE — Telephone Encounter (Signed)
Received notification that Vascepa 1 gm capsules are not covered by insurance and need PA.   KEY: [id number] Last name: Ewton DOB: 1956/01/24  Will route to [place], LPN Thank you!

## 2021-05-20 NOTE — Telephone Encounter (Signed)
**Note De-Identified Rhealyn Cullen Obfuscation** Message from Covermymeds: [name] Key: [id number] - PA Case ID: [id number] - Rx #: [id number] Outcome: Approved today Approved, Coverage Starts on: 05/20/2021 12:00:00 AM, Coverage Ends on: 05/20/2022 12:00:00 AM. Drug: Icosapent Ethyl 1GM capsules Form: [employer] [facility] Electronic [place] Form [phone] NCPDP)  [facility] is aware of this approval.

## 2021-05-20 NOTE — Telephone Encounter (Signed)
**Note De-Identified Ripken Rekowski Obfuscation** Icosapet PA started through covermymeds. Key: [id number]

## 2021-05-21 ENCOUNTER — Other Ambulatory Visit: Payer: Self-pay | Admitting: Family Medicine

## 2021-05-21 DIAGNOSIS — I10 Essential (primary) hypertension: Secondary | ICD-10-CM

## 2021-05-25 ENCOUNTER — Other Ambulatory Visit: Payer: Self-pay

## 2021-05-25 ENCOUNTER — Ambulatory Visit (HOSPITAL_COMMUNITY): Payer: Medicaid Other | Attending: Cardiology

## 2021-05-25 DIAGNOSIS — R0602 Shortness of breath: Secondary | ICD-10-CM

## 2021-05-25 DIAGNOSIS — R072 Precordial pain: Secondary | ICD-10-CM

## 2021-05-25 LAB — ECHOCARDIOGRAM COMPLETE
AR max vel: 1.73 cm2
AV Area VTI: 1.84 cm2
AV Area mean vel: 1.66 cm2
AV Mean grad: 7.3 mmHg
AV Peak grad: 13.7 mmHg
Ao pk vel: 1.85 m/s
Area-P 1/2: 3.31 cm2
S' Lateral: 3.7 cm

## 2021-05-25 MED ORDER — PERFLUTREN LIPID MICROSPHERE
1.0000 mL | INTRAVENOUS | Status: AC | PRN
  Administered 2021-05-25: 1 mL via INTRAVENOUS

## 2021-05-27 ENCOUNTER — Telehealth: Payer: Self-pay | Admitting: Cardiovascular Disease

## 2021-05-27 NOTE — Telephone Encounter (Signed)
Return mail with lab results contacted pt to update information left voicemail.

## 2021-06-08 ENCOUNTER — Encounter (HOSPITAL_COMMUNITY): Payer: Self-pay

## 2021-06-09 NOTE — Addendum Note (Signed)
Addended by: Venetia Maxon on: 06/09/2021 08:49 AM   Modules accepted: Orders

## 2021-07-19 IMAGING — CR DG CHEST 2V
2 series · 2 of 2 positions shown · non-contrast
Comparison: October 03, 2018

CLINICAL DATA: Shortness of breath and chest pain

EXAM:
CHEST - 2 VIEW

[w chest pa]
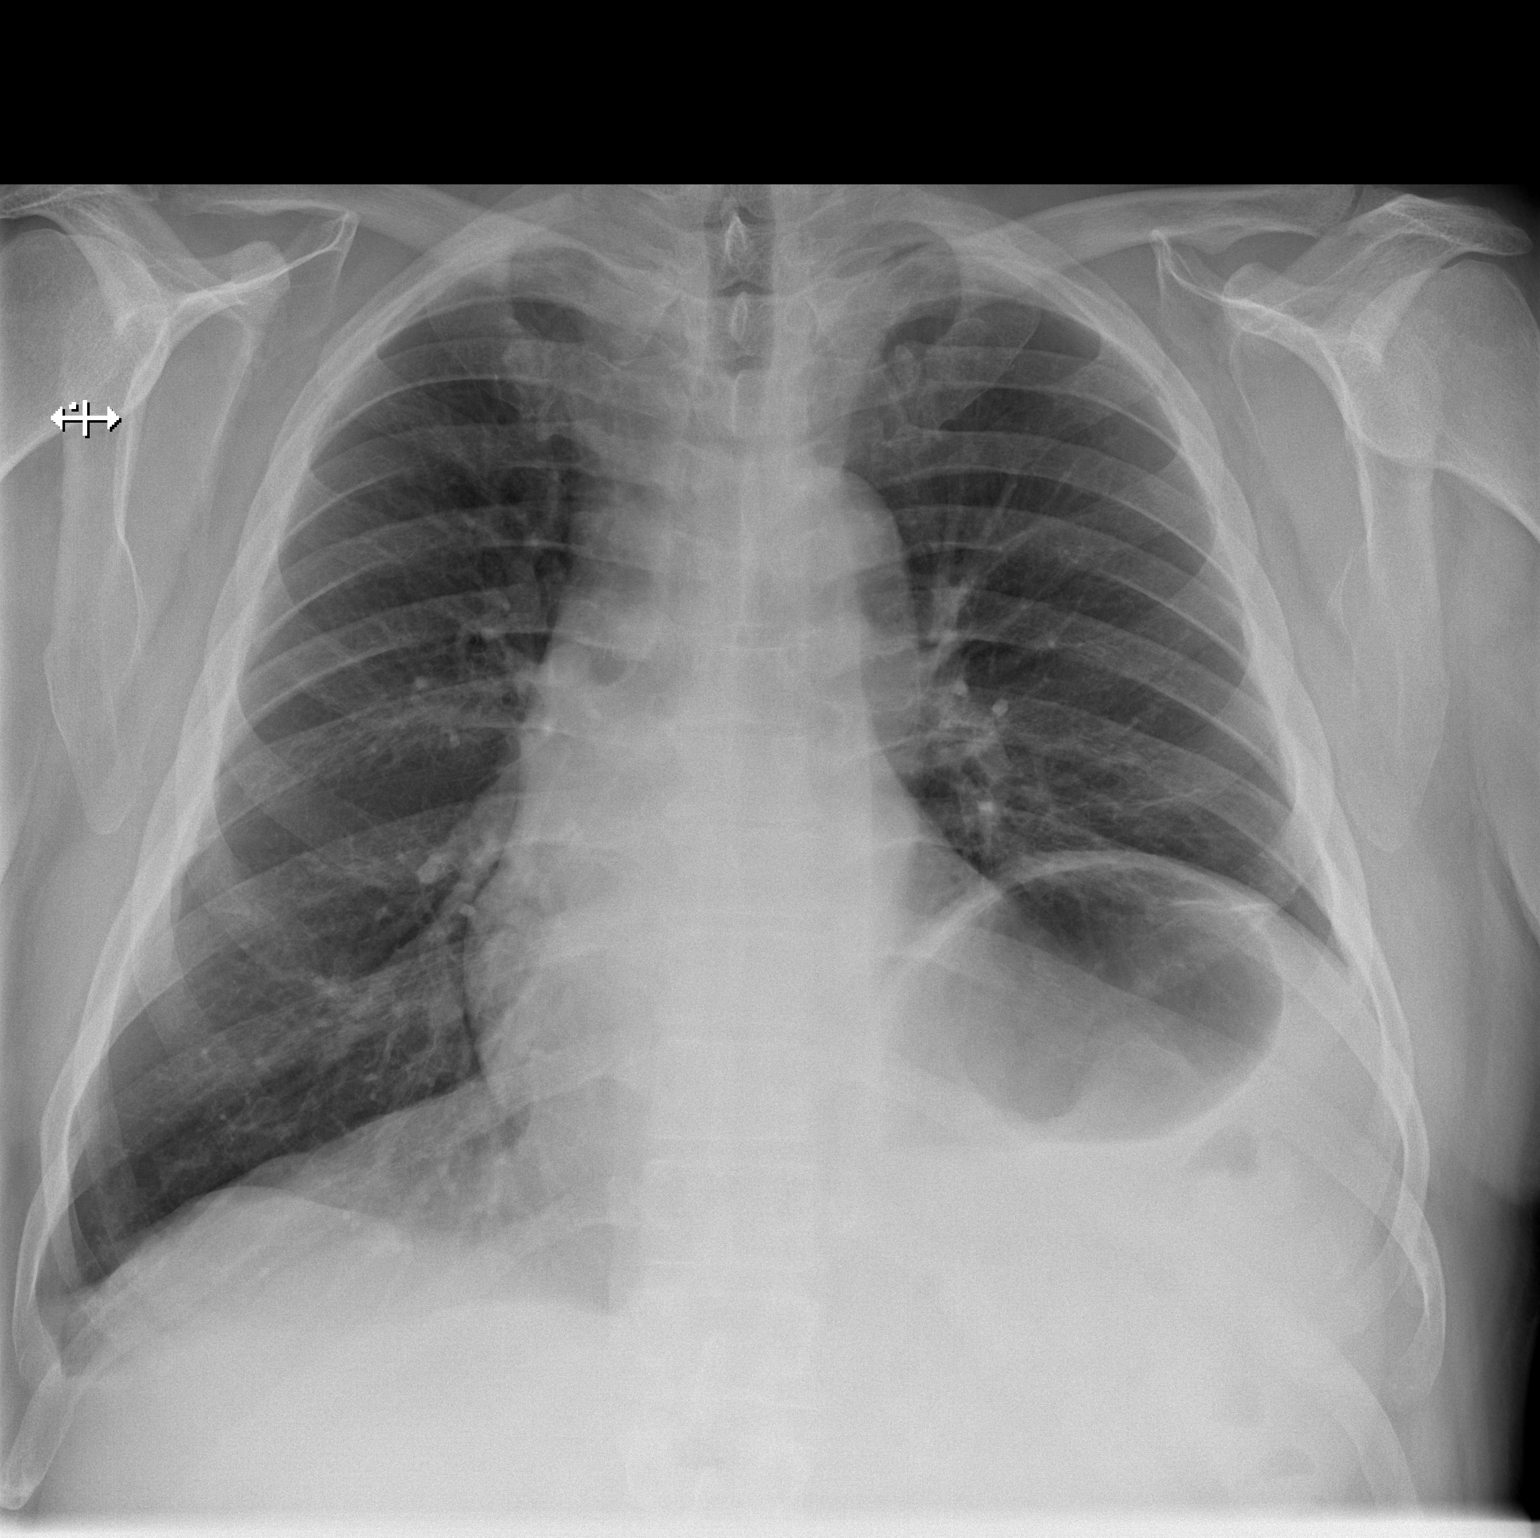

[w chest lat]
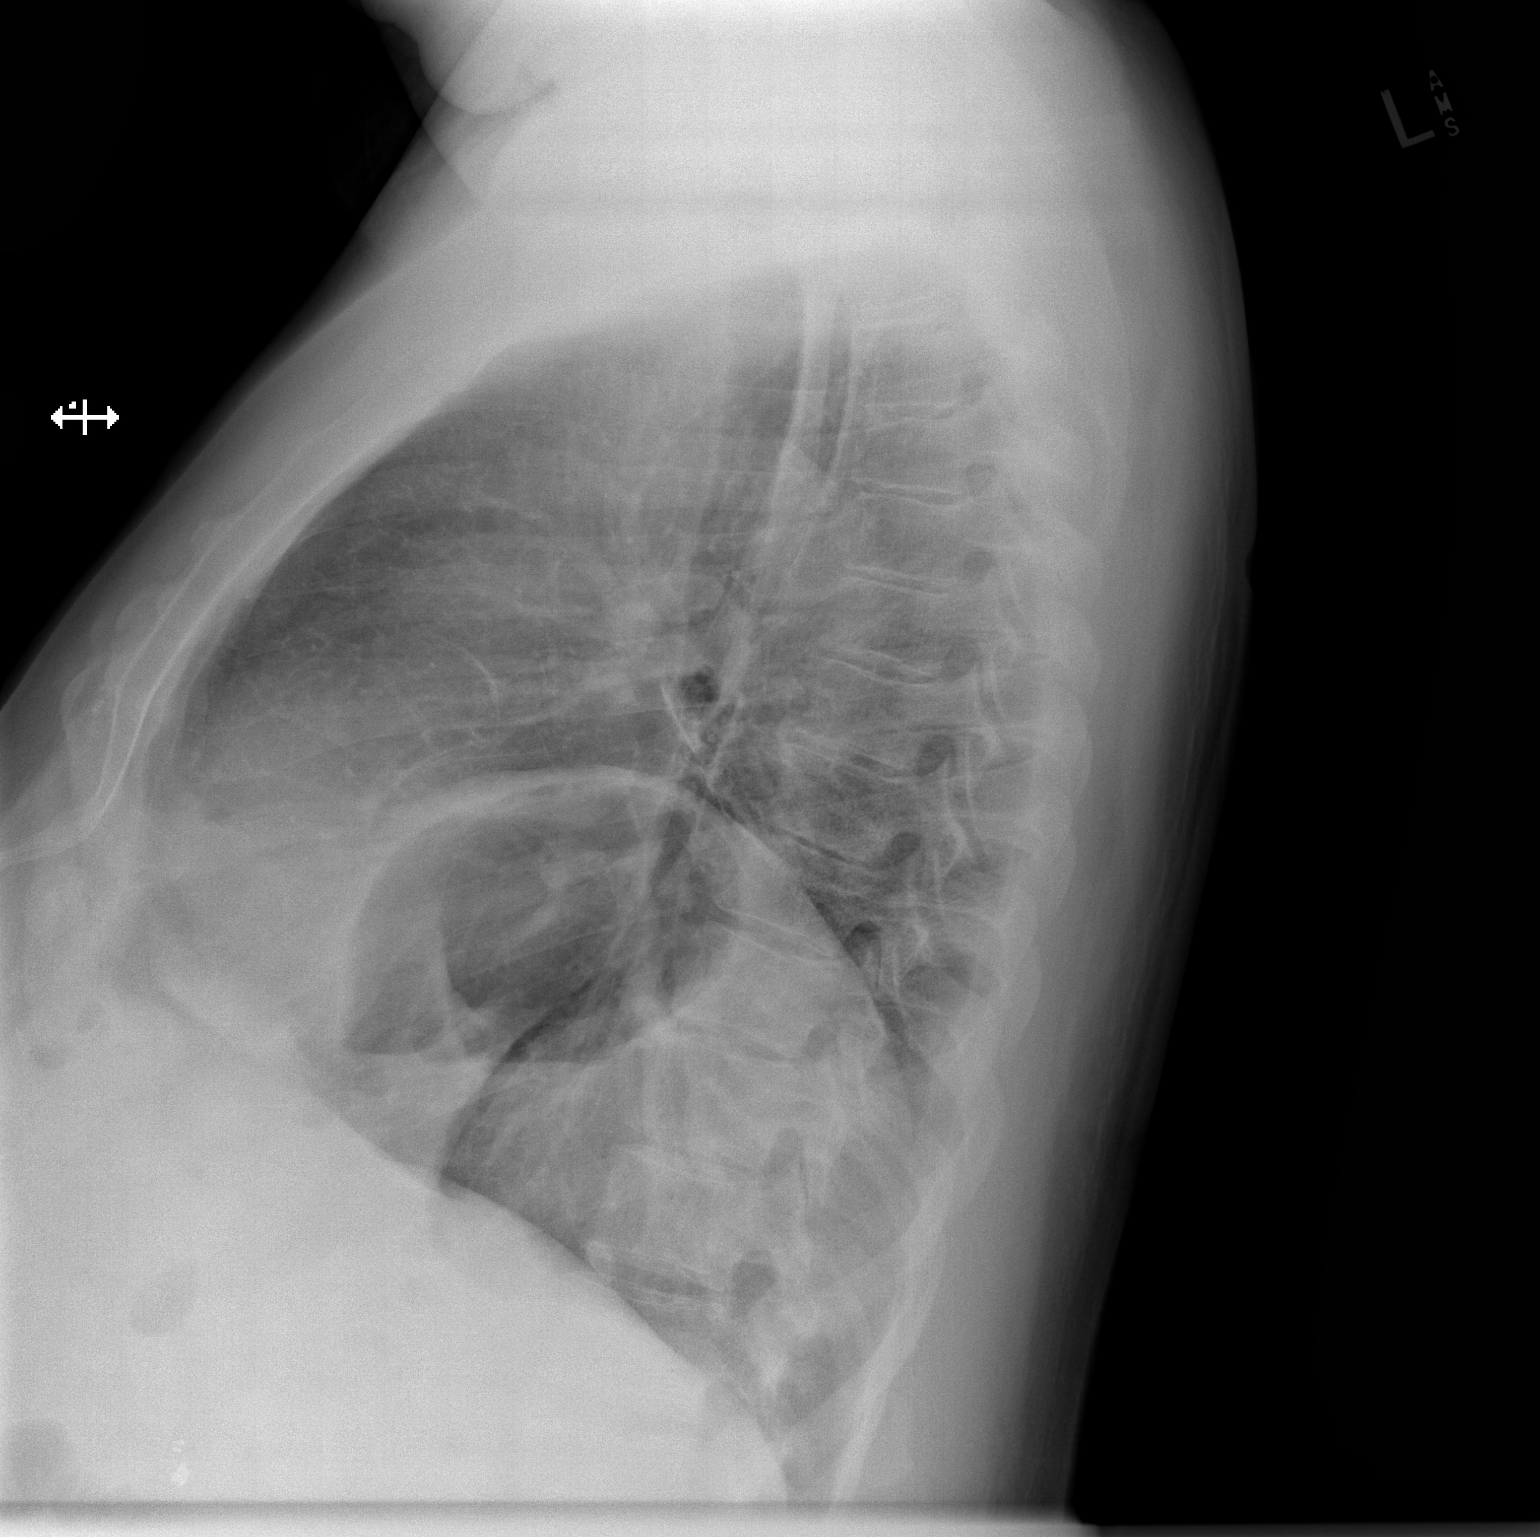

[2 of 2 positions shown; findings below may reference images not displayed]

FINDINGS: The heart size and mediastinal contours are within normal limits.
Both lungs are clear. Chronic elevation of left hemidiaphragm is
unchanged. The visualized skeletal structures are unremarkable.
IMPRESSION: No active cardiopulmonary disease.

## 2021-07-28 ENCOUNTER — Emergency Department (HOSPITAL_COMMUNITY)
Admission: EM | Admit: 2021-07-28 | Discharge: 2021-07-28 | Disposition: A | Discharge status: Home / Self Care | Payer: Medicaid Other | Attending: Emergency Medicine | Admitting: Emergency Medicine

## 2021-07-28 ENCOUNTER — Ambulatory Visit: Payer: Medicaid Other | Admitting: Family Medicine

## 2021-07-28 VITALS — BP 142/92 | HR 96 | Ht 69.0 in | Wt 248.8 lb

## 2021-07-28 DIAGNOSIS — N401 Enlarged prostate with lower urinary tract symptoms: Secondary | ICD-10-CM | POA: Diagnosis not present

## 2021-07-28 DIAGNOSIS — Z7982 Long term (current) use of aspirin: Secondary | ICD-10-CM | POA: Insufficient documentation

## 2021-07-28 DIAGNOSIS — E119 Type 2 diabetes mellitus without complications: Secondary | ICD-10-CM | POA: Diagnosis not present

## 2021-07-28 DIAGNOSIS — R350 Frequency of micturition: Secondary | ICD-10-CM

## 2021-07-28 DIAGNOSIS — R339 Retention of urine, unspecified: Secondary | ICD-10-CM | POA: Diagnosis present

## 2021-07-28 DIAGNOSIS — R338 Other retention of urine: Secondary | ICD-10-CM

## 2021-07-28 DIAGNOSIS — Z79899 Other long term (current) drug therapy: Secondary | ICD-10-CM | POA: Diagnosis not present

## 2021-07-28 DIAGNOSIS — I10 Essential (primary) hypertension: Secondary | ICD-10-CM | POA: Diagnosis not present

## 2021-07-28 DIAGNOSIS — Z7984 Long term (current) use of oral hypoglycemic drugs: Secondary | ICD-10-CM | POA: Diagnosis not present

## 2021-07-28 LAB — URINALYSIS, ROUTINE W REFLEX MICROSCOPIC
Bilirubin Urine: NEGATIVE
Glucose, UA: >=500 mg/dL — AB
Ketones, ur: NEGATIVE mg/dL
Leukocytes,Ua: NEGATIVE
Nitrite: NEGATIVE
Protein, ur: NEGATIVE mg/dL
Specific Gravity, Urine: 1.025 (ref 1.005–1.030)
pH: 5.5 (ref 5.0–8.0)

## 2021-07-28 LAB — CBC WITH DIFFERENTIAL/PLATELET
Abs Immature Granulocytes: 0.07 10*3/uL (ref 0.00–0.07)
Basophils Absolute: 0 10*3/uL (ref 0.0–0.1)
Basophils Relative: 0 %
Eosinophils Absolute: 0.2 10*3/uL (ref 0.0–0.5)
Eosinophils Relative: 2 %
HCT: 42.1 % (ref 39.0–52.0)
Hemoglobin: 14 g/dL (ref 13.0–17.0)
Immature Granulocytes: 1 %
Lymphocytes Relative: 14 %
Lymphs Abs: 1.4 10*3/uL (ref 0.7–4.0)
MCH: 27.6 pg (ref 26.0–34.0)
MCHC: 33.3 g/dL (ref 30.0–36.0)
MCV: 83 fL (ref 80.0–100.0)
Monocytes Absolute: 0.6 10*3/uL (ref 0.1–1.0)
Monocytes Relative: 6 %
Neutro Abs: 7.9 10*3/uL — ABNORMAL HIGH (ref 1.7–7.7)
Neutrophils Relative %: 77 %
Platelets: 154 10*3/uL (ref 150–400)
RBC: 5.07 MIL/uL (ref 4.22–5.81)
RDW: 13.6 % (ref 11.5–15.5)
WBC: 10.3 10*3/uL (ref 4.0–10.5)
nRBC: 0 % (ref 0.0–0.2)

## 2021-07-28 LAB — BASIC METABOLIC PANEL
Anion gap: 8 (ref 5–15)
BUN: 11 mg/dL (ref 8–23)
CO2: 26 mmol/L (ref 22–32)
Calcium: 8.8 mg/dL — ABNORMAL LOW (ref 8.9–10.3)
Chloride: 103 mmol/L (ref 98–111)
Creatinine, Ser: 0.98 mg/dL (ref 0.61–1.24)
GFR, Estimated: >60 mL/min (ref >60)
Glucose, Bld: 253 mg/dL — ABNORMAL HIGH (ref 70–99)
Potassium: 4.1 mmol/L (ref 3.5–5.1)
Sodium: 137 mmol/L (ref 135–145)

## 2021-07-28 LAB — URINALYSIS, MICROSCOPIC (REFLEX): Bacteria, UA: NONE SEEN

## 2021-07-28 MED ORDER — ACETAMINOPHEN 500 MG PO TABS
ORAL_TABLET | ORAL | Status: AC
  Filled 2021-07-28: qty 1

## 2021-07-28 MED ORDER — HYDROCODONE-ACETAMINOPHEN 5-325 MG PO TABS: 1.0000 | ORAL_TABLET | Freq: Four times a day (QID) | ORAL | 0 refills | Status: DC | PRN

## 2021-07-28 MED ORDER — ACETAMINOPHEN 500 MG PO TABS
1000.0000 mg | ORAL_TABLET | Freq: Once | ORAL | Status: AC
  Administered 2021-07-28: 1000 mg via ORAL
  Filled 2021-07-28: qty 2

## 2021-07-28 NOTE — Patient Instructions (Addendum)
It was great seeing you today! ? ?Today we discussed your abdominal pain, I think this is due to you retaining urine. We did a bladder ultrasound which showed a good amount of urine. I strongly think this is causing your significant pain. Therefore we recommend going to the emergency department now to get this urine off which will help you feel better. ? ?I have also placed a referral to the urologist, you may be able to see them in the hospital.  ? ?Please follow up at your next scheduled appointment, if anything arises between now and then, please don't hesitate to contact our office. ? ? ?Thank you for allowing [place] to be a part of your medical care! ? ?Thank you, ?Dr. Larae Grooms  ?

## 2021-07-28 NOTE — ED Triage Notes (Signed)
Pt c/o abd pain, urinary retention x2-3wks, "just a trickle." Seen at PCP for same, bladder scan revealed approx 311m urine. Pt also states last bowel movement 2-3 days ago, "dark black/tarry." ?States PCP advised him that his prostate is enlarged ?

## 2021-07-28 NOTE — Assessment & Plan Note (Signed)
-  likely secondary to BPH, bladder ultrasound performed in clinic notable for 338 mL ?-instructed to go to the ED for cath along with possible admission ?-ED charge nurse and [place] inpatient team notified  ?-attempted UA although unable to obtain given patient unable to void  ?-may consider increasing flomax  ?-urology referral placed ?-consider obtaining PSA in the future  ?

## 2021-07-28 NOTE — ED Provider Notes (Signed)
?[facility] ?Provider Note ? ? ?CSN: [id number] ?Arrival date & time: 07/28/21  1634 ? ?  ? ?History ? ?Chief Complaint  ?Patient presents with  ? Abdominal Pain  ? Urinary Retention  ? ? ?KASHIUS DOMINIC is a 66 y.o. male. ? ?The history is provided by the patient and medical records. No language interpreter was used.  ?Abdominal Pain ? ?66 year old male significant history of BPH, obesity, diabetes, HTN presenting complaining of difficulty urinating.  Patient endorsed for the past 2 to 3 weeks he has noticed increased urinary difficulty.  States that he is having weak stream, sometimes dribbling only a few drops of urine and sometimes he also endorses urinary incontinence.  He endorsed having lower abdominal pain throughout the day today described as a pressure sensation and having more trouble urinating.  Last bowel movement was earlier this morning and it was dark and hard.  He mentioned that his primary care doctor did mention that he had a large prostate.  Patient otherwise denies having fever chills nausea vomiting chest pain trouble breathing or burning urination.  No history of cancer. ? ?Home Medications ?Prior to Admission medications   ?Medication Sig Start Date End Date Taking? Authorizing Provider  ?amitriptyline (ELAVIL) 10 MG tablet Take 1 tablet (10 mg total) by mouth at bedtime. 01/08/19   Nuala Alpha, MD  ?amLODipine (NORVASC) 10 MG tablet TAKE 1 TABLET BY MOUTH EVERY DAY 05/24/21   Alcus Dad, MD  ?aspirin 81 MG EC tablet TAKE 1 TABLET (81 MG TOTAL) BY MOUTH DAILY. 07/08/16   Ronnie Doss M, DO  ?divalproex (DEPAKOTE ER) 500 MG 24 hr tablet Take 500 mg by mouth at bedtime. 04/28/21   [provider]  ?hydrOXYzine (ATARAX/VISTARIL) 10 MG tablet Take 1 tablet (10 mg total) by mouth 3 (three) times daily as needed. 03/13/19   Nuala Alpha, MD  ?hydrOXYzine (ATARAX/VISTARIL) 25 MG tablet Take 1 tablet (25 mg total) by mouth every 6 (six)  hours. 03/01/19   Recardo Evangelist, PA-C  ?icosapent Ethyl (VASCEPA) 1 g capsule Take 2 capsules (2 g total) by mouth 2 (two) times daily. 05/10/21   O'NealCassie Freer, MD  ?Melatonin 5 MG CAPS Take 1 capsule (5 mg total) by mouth at bedtime. 01/24/19   Nuala Alpha, MD  ?metFORMIN (GLUCOPHAGE) 1000 MG tablet TAKE 1 TABLET (1,000 MG TOTAL) BY MOUTH 2 (TWO) TIMES DAILY WITH A MEAL. 01/29/20   Meccariello, Bernita Raisin, DO  ?metoprolol tartrate (LOPRESSOR) 50 MG tablet Take 1 tablet by mouth once for procedure. 05/10/21   O'NealCassie Freer, MD  ?omeprazole (PRILOSEC) 20 MG capsule Take 1 capsule (20 mg total) by mouth daily. 06/02/20   Domenic Moras, PA-C  ?rosuvastatin (CRESTOR) 40 MG tablet Take 1 tablet (40 mg total) by mouth daily. 05/10/21   O'NealCassie Freer, MD  ?tamsulosin (FLOMAX) 0.4 MG CAPS capsule Take 1 capsule (0.4 mg total) by mouth daily. 03/16/21   Lenoria Chime, MD  ?traZODone (DESYREL) 50 MG tablet Take 1 tablet (50 mg total) by mouth at bedtime. 04/20/19   Blanchie Dessert, MD  ?venlafaxine XR (EFFEXOR-XR) 150 MG 24 hr capsule Take 150 mg by mouth 2 (two) times daily. 04/28/21   [provider]  ?mirtazapine (REMERON) 15 MG tablet Take 1 tablet (15 mg total) by mouth at bedtime. 03/19/19 04/20/19  Nuala Alpha, MD  ?   ? ?Allergies    ?Methylprednisolone sodium succ   ? ?Review of Systems   ?  Review of Systems  ?Gastrointestinal:  Positive for abdominal pain.  ?All other systems reviewed and are negative. ? ?Physical Exam ?Updated Vital Signs ?BP (!) 153/93 (BP Location: Right Arm)   Pulse 82   Temp 98.2 ?F (36.8 ?C)   Resp 14   SpO2 95%  ?Physical Exam ?Vitals and nursing note reviewed.  ?Constitutional:   ?   General: [name] not in acute distress. ?   Appearance: [name] well-developed. [name] obese.  ?HENT:  ?   Head: Atraumatic.  ?Eyes:  ?   Conjunctiva/sclera: Conjunctivae normal.  ?Cardiovascular:  ?   Rate and Rhythm: Normal rate and regular rhythm.  ?Pulmonary:  ?    Effort: Pulmonary effort is normal.  ?   Breath sounds: Normal breath sounds.  ?Abdominal:  ?   Tenderness: There is abdominal tenderness (Tenderness to suprapubic region with firmness appreciated).  ?Musculoskeletal:  ?   Cervical back: Neck supple.  ?Skin: ?   Findings: No rash.  ?Neurological:  ?   Mental Status: [name] alert and oriented to person, place, and time.  ?Psychiatric:     ?   Mood and Affect: Mood normal.  ? ? ?ED Results / Procedures / Treatments   ?Labs ?(all labs ordered are listed, but only abnormal results are displayed) ?Labs Reviewed  ?CBC WITH DIFFERENTIAL/PLATELET - Abnormal; Notable for the following components:  ?    Result Value  ? Neutro Abs 7.9 (*)   ? All other components within normal limits  ?BASIC METABOLIC PANEL - Abnormal; Notable for the following components:  ? Glucose, Bld 253 (*)   ? Calcium 8.8 (*)   ? All other components within normal limits  ?URINALYSIS, ROUTINE W REFLEX MICROSCOPIC - Abnormal; Notable for the following components:  ? Glucose, UA >=500 (*)   ? Hgb urine dipstick SMALL (*)   ? All other components within normal limits  ?URINALYSIS, MICROSCOPIC (REFLEX)  ? ? ?EKG ?None ? ?Radiology ?No results found. ? ?Procedures ?Procedures  ? ? ?Medications Ordered in ED ?Medications - No data to display ? ?ED Course/ Medical Decision Making/ A&P ?  ?                        ?Medical Decision Making ? ?BP (!) 153/93 (BP Location: Right Arm)   Pulse 82   Temp 98.2 ?F (36.8 ?C)   Resp 14   SpO2 95%  ? ?8:45 PM ?This is a 66 year old male with history of BPH presenting with suprapubic tenderness and having difficulty urinating for the past several weeks worsening throughout the day today.  He does have tenderness to his suprapubic region on palpation.  A postvoid residual of 350 were noted on bedside ultrasound.  Labs obtained independently reviewed and interpreted by me.  Labs are reassuring, elevated CBG of 253 with out evidence of DKA.  Normal WBC, normal H&H. ? ?I have  order for indwelling Foley catheter to help alleviate his discomfort.  I have consider constipation potentially causing his symptoms but felt that it is less likely in the setting of having known history of BPH.  I have low suspicion for diverticulitis, colitis, malignancy, end-stage renal disease, or appendicitis. Doubt kidney stone or UTI.  ? ?11:09 PM ?Due to having acute urinary retention, a Foley was inserted and patient reported feeling better.  Patient was placed in a leg bag, and he will need to follow-up with urology for further assessments of his condition  and for removal of the Foley when appropriate.  Patient voiced understanding and agrees with plan.  He did request for pain medication to go home.  I will provide a short course of pain medication ? ? ? ? ? ? ? ?Final Clinical Impression(s) / ED Diagnoses ?Final diagnoses:  ?Acute urinary retention  ? ? ?Rx / DC Orders ?ED Discharge Orders   ? ? None  ? ?  ? ? ?  ?Domenic Moras, PA-C ?07/28/21 2314 ? ?  ?Luna Fuse, MD ?08/06/21 1742 ? ?

## 2021-07-28 NOTE — ED Provider Triage Note (Signed)
Emergency Medicine Provider Triage Evaluation Note ? ?[name] , a 66 y.o. male  was evaluated in triage.  Pt complains of urinary retention.  He was sent here by PCP. Complains of trouble urinating for 2-3 weeks. He can only get a trickle out at a time. He was told recently that his prostate is enlarged. Bladder scan shows 350 cc. PCP sent him here for foley. Patient also endorses dark and tarry stools about 2-3 days ago. ? ?Review of Systems  ?Positive: Urinary retention ?Negative:  ? ?Physical Exam  ?BP 139/87 (BP Location: Right Arm)   Pulse 94   Temp 98.2 ?F (36.8 ?C)   Resp 17   SpO2 94%  ?Gen:   Awake, no distress   ?Resp:  Normal effort  ?MSK:   Moves extremities without difficulty  ?Other:   ? ?Medical Decision Making  ?Medically screening exam initiated at 4:50 PM.  Appropriate orders placed.  [name] was informed that the remainder of the evaluation will be completed by another provider, this initial triage assessment does not replace that evaluation, and the importance of remaining in the ED until their evaluation is complete. ? ? ?  ?Adolphus Birchwood, PA-C ?07/28/21 1651 ? ?

## 2021-07-28 NOTE — Progress Notes (Signed)
? ? ?  SUBJECTIVE:  ? ?CHIEF COMPLAINT / HPI:  ? ?Patient with history of BPH presents with worsening urinary retention, has previously come in for this concern a few months ago. Continues to experience dribbling. Has the urge to go but not able to urinate completely. Although this has been ongoing for a few months, he shares that it has been getting worse. Endorses back pain, intermittent dysuria and abdominal pain. Denies fever, chills, nausea, vomiting and chest pain. Rates his pain 10/10. Has been compliant with flomax nightly and he has not noticed any improvement, in fact his symptoms have worsened.  ? ?OBJECTIVE:  ? ?BP (!) 142/92   Pulse 96   Ht '5\' 9"'$  (1.753 m)   Wt 248 lb 12.8 oz (112.9 kg)   BMI 36.74 kg/m?   ?General: Patient well-groomed, in mild distress. ?CV: RRR, no murmurs or gallops auscultated ?Resp: CTAB, no wheezing, rales or rhonchi noted ?Abdomen: soft, mildly distended, tenderness along lower quadrants bilaterally more prominently above pubic symphysis, presence of bowel sounds ?MSK: no evidence of CVA tenderness ?Ext: very mild pitting LE edema noted bilaterally ?Neuro: normal gait  ? ?ASSESSMENT/PLAN:  ? ?Urinary retention ?-likely secondary to BPH, bladder ultrasound performed in clinic notable for 338 mL ?-instructed to go to the ED for cath along with possible admission ?-ED charge nurse and [place] inpatient team notified  ?-attempted UA although unable to obtain given patient unable to void  ?-may consider increasing flomax  ?-urology referral placed ?-consider obtaining PSA in the future  ? ? ?Donney Dice, DO ?[address]  ?

## 2021-07-28 NOTE — ED Notes (Signed)
Pt verbalized understanding of d/c instructions, meds, and followup care. Denies questions. VSS, no distress noted. Steady gait to exit with all belongings. Given education on foley care and printed care instructions to take home. ?

## 2021-07-28 NOTE — Progress Notes (Signed)
POCUS done at bedside with Dr Larae Grooms, estimated PVR 338 mL. As this is > 300 mL and bladder appears grossly distended on POCUS and pt unable to urinate but a few drops, recommend pt proceeds to ED for urinary catheter placement and evaluation. ? ?Called and updated ED Agricultural consultant. ? ?[name] MD ?

## 2021-07-28 NOTE — Discharge Instructions (Addendum)
You have been evaluated for your acute urinary retention likely related to benign prostatic hypertrophy.  A indwelling Foley catheter was placed and this will need to be reassessed by urologist within the next several days.  Please call and follow-up closely with urologist for further care.  You may take pain medication as needed but be aware that it can cause drowsiness so do not operate any heavy machinery or driving while taking pain medication.  Return if you have any concern. ?

## 2021-07-30 ENCOUNTER — Telehealth (HOSPITAL_COMMUNITY): Payer: Self-pay | Admitting: Emergency Medicine

## 2021-07-30 MED ORDER — HYDROCODONE-ACETAMINOPHEN 5-325 MG PO TABS: 1.0000 | ORAL_TABLET | ORAL | 0 refills | Status: DC | PRN

## 2021-08-03 ENCOUNTER — Telehealth: Payer: Self-pay

## 2021-08-03 NOTE — Telephone Encounter (Signed)
Agree with evaluation in the ED. ?

## 2021-08-03 NOTE — Telephone Encounter (Signed)
Patient calls nurse line reporting infection at foley cath site.  ? ?Patient reports he has had foley in place for ~6 days. However, for the last 3 days he has noticed blood and puss in bag. Patient reports the "entire area down there is swollen and red." Patient is tearful on the phone due to the pain. Patient reports fever for the last 3 days, however does not have a thermometer to check.  ? ?I advised patient due to severity of pain and signs of infection to proceed to the ED.  ? ?Patient was hesitant at first due to wait times, however reports he will go.  ?

## 2021-08-04 ENCOUNTER — Ambulatory Visit: Payer: Medicaid Other | Admitting: Cardiovascular Disease

## 2021-08-23 ENCOUNTER — Other Ambulatory Visit: Payer: Self-pay | Admitting: Family Medicine

## 2021-09-29 ENCOUNTER — Other Ambulatory Visit: Payer: Self-pay | Admitting: Urology

## 2021-10-05 ENCOUNTER — Other Ambulatory Visit: Payer: Self-pay | Admitting: Family Medicine

## 2021-10-05 DIAGNOSIS — I10 Essential (primary) hypertension: Secondary | ICD-10-CM

## 2021-10-13 DIAGNOSIS — R338 Other retention of urine: Secondary | ICD-10-CM | POA: Diagnosis not present

## 2021-10-15 ENCOUNTER — Other Ambulatory Visit: Payer: Self-pay | Admitting: Urology

## 2021-10-15 ENCOUNTER — Other Ambulatory Visit (HOSPITAL_COMMUNITY): Payer: Self-pay | Admitting: Urology

## 2021-10-15 DIAGNOSIS — C61 Malignant neoplasm of prostate: Secondary | ICD-10-CM

## 2021-10-29 ENCOUNTER — Ambulatory Visit (HOSPITAL_COMMUNITY)
Admission: RE | Admit: 2021-10-29 | Discharge: 2021-10-29 | Disposition: A | Discharge status: Home / Self Care | Payer: Medicare Other | Source: Ambulatory Visit | Attending: Urology | Admitting: Urology

## 2021-10-29 DIAGNOSIS — C61 Malignant neoplasm of prostate: Secondary | ICD-10-CM | POA: Diagnosis present

## 2021-10-29 DIAGNOSIS — I700 Atherosclerosis of aorta: Secondary | ICD-10-CM | POA: Diagnosis not present

## 2021-10-29 DIAGNOSIS — K5730 Diverticulosis of large intestine without perforation or abscess without bleeding: Secondary | ICD-10-CM | POA: Diagnosis not present

## 2021-10-29 MED ORDER — TECHNETIUM TC 99M MEDRONATE IV KIT
20.0000 | PACK | Freq: Once | INTRAVENOUS | Status: AC | PRN
  Administered 2021-10-29: 20.4 via INTRAVENOUS

## 2021-11-09 ENCOUNTER — Encounter (HOSPITAL_BASED_OUTPATIENT_CLINIC_OR_DEPARTMENT_OTHER): Payer: Self-pay

## 2021-11-09 ENCOUNTER — Ambulatory Visit (HOSPITAL_BASED_OUTPATIENT_CLINIC_OR_DEPARTMENT_OTHER): Admit: 2021-11-09 | Payer: Medicaid Other | Admitting: Urology

## 2021-11-09 SURGERY — TURP (TRANSURETHRAL RESECTION OF PROSTATE)
Anesthesia: General

## 2021-11-24 ENCOUNTER — Ambulatory Visit
Admission: RE | Admit: 2021-11-24 | Discharge: 2021-11-24 | Disposition: A | Discharge status: Home / Self Care | Payer: Medicare Other | Source: Ambulatory Visit | Attending: Urology | Admitting: Urology

## 2021-11-24 ENCOUNTER — Other Ambulatory Visit: Payer: Self-pay | Admitting: Urology

## 2021-11-24 DIAGNOSIS — Z09 Encounter for follow-up examination after completed treatment for conditions other than malignant neoplasm: Secondary | ICD-10-CM

## 2021-12-24 ENCOUNTER — Other Ambulatory Visit (HOSPITAL_COMMUNITY): Payer: Self-pay | Admitting: Urology

## 2021-12-24 DIAGNOSIS — C61 Malignant neoplasm of prostate: Secondary | ICD-10-CM

## 2022-01-04 ENCOUNTER — Ambulatory Visit (HOSPITAL_COMMUNITY)
Admission: RE | Admit: 2022-01-04 | Discharge: 2022-01-04 | Disposition: A | Discharge status: Home / Self Care | Payer: Medicare Other | Source: Ambulatory Visit | Attending: Urology | Admitting: Urology

## 2022-01-04 DIAGNOSIS — C61 Malignant neoplasm of prostate: Secondary | ICD-10-CM | POA: Insufficient documentation

## 2022-01-04 DIAGNOSIS — K4090 Unilateral inguinal hernia, without obstruction or gangrene, not specified as recurrent: Secondary | ICD-10-CM | POA: Diagnosis not present

## 2022-01-04 DIAGNOSIS — I2510 Atherosclerotic heart disease of native coronary artery without angina pectoris: Secondary | ICD-10-CM | POA: Diagnosis not present

## 2022-01-04 DIAGNOSIS — J329 Chronic sinusitis, unspecified: Secondary | ICD-10-CM | POA: Diagnosis not present

## 2022-01-04 MED ORDER — PIFLIFOLASTAT F 18 (PYLARIFY) INJECTION
9.0000 | Freq: Once | INTRAVENOUS | Status: AC
  Administered 2022-01-04: 8.94 via INTRAVENOUS

## 2022-01-18 DIAGNOSIS — R338 Other retention of urine: Secondary | ICD-10-CM | POA: Diagnosis not present

## 2022-01-24 NOTE — Progress Notes (Unsigned)
Cardiology Office Note:    Date:  01/25/2022   ID:  Jacob [name], DOB 1955-12-24, MRN [id number]  PCP:  Alcus Dad, [place] [facility] Cardiologist:  Evalina Field, MD     Referring MD: Raynelle Bring, MD   Chief Complaint  Patient presents with   Pre-op Exam    HTN    History of Present Illness:    Jacob [name] is a 66 y.o. male with a hx of HTN, HLD, OSA, obesity, depression, and DM.  He was seen Dr. Audie Box 05/10/2021 with chest pain of uncertain etiology.  He was recommended for CT coronary but it does not look like this was completed.  An echocardiogram was completed for dyspnea that showed normal BiV function and no valvular disease.  He has since developed urinary retention and enlarged prostate.  [name] planning to undergo transurethral resection of the prostate and preoperative cardiac evaluation was recommended.   He presents today for evaluation and is tearful that his depression is not managed, he is out of vraylor, but it didn't help him anyway. He denies any further chest pain. [name] fairly sedentary, but can walk all of [employer] and carry in groceries without chest pain.   We had a long discussion regarding unrealized risk such as CAD that we don't know about. Per his report, he is able to complete METS without angina. He has no resting chest pain. No signs or symptoms of heart failure. No history of ischemic heart disease.     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 meds   Increased urinary frequency    Kidney stones 08-10-11   once in past, none recent   Methylprednisolone-induced hypoxia 08/23/2011   Neuromuscular disorder ([place]) 08-10-11   "burning/tingling sensation In feet"   Obesity    Obesity, Class II, BMI 35-39.9, with comorbidity 06/29/2006   OSA (obstructive sleep apnea)    "couldn't deal w/CPAP" (06/08/2016)   Preoperative clearance    RUQ pain 06/09/2016   Shortness of breath 08-10-11   with increased activity only   Syncope and collapse 06/09/2016   Syncope and collapse 06/09/2016   Type II diabetes mellitus ([place])    Umbilical hernia     Past Surgical History:  Procedure Laterality Date   CATARACT EXTRACTION W/ INTRAOCULAR LENS  IMPLANT, BILATERAL Bilateral 08/10/2011 - ~ 2014   "right - left"   CHOLECYSTECTOMY N/A 06/10/2016   Procedure: LAPAROSCOPIC CHOLECYSTECTOMY WITH INTRAOPERATIVE CHOLANGIOGRAM;  Surgeon: Erroll Luna, MD;  [facility]  OR;  Service: General;  Laterality: N/A;   COLONOSCOPY W/ BIOPSIES AND POLYPECTOMY  2013   HERNIA REPAIR     LIPOMA EXCISION Right 10/18/2018   Procedure: EXCISION RIGHT BACK  LIPOMA X 2;  Surgeon: Erroll Luna, MD;  Location: [place];  Service: General;  Laterality: Right;   VENTRAL HERNIA REPAIR  08/22/2011   Procedure: HERNIA REPAIR VENTRAL ADULT;  Surgeon: Adin Hector, MD;  Location: [facility] ORS;  Service: General;  Laterality: N/A;  incarcerated    Current Medications: Current Meds  Medication Sig   aspirin 81 MG EC tablet TAKE 1 TABLET (81 MG TOTAL) BY  MOUTH DAILY.   Melatonin 5 MG CAPS Take 1 capsule (5 mg total) by mouth at bedtime.   metFORMIN (GLUCOPHAGE) 1000 MG tablet TAKE 1 TABLET (1,000 MG TOTAL) BY MOUTH 2 (TWO) TIMES DAILY WITH A MEAL.   rosuvastatin (CRESTOR) 40 MG tablet Take 1 tablet (40 mg total) by mouth daily.   traZODone (DESYREL) 50 MG tablet Take 1 tablet (50 mg total) by mouth at bedtime.   [DISCONTINUED] amLODipine (NORVASC) 10 MG tablet TAKE 1 TABLET BY MOUTH EVERY DAY. This will be your last refill until you are seen in the office to discuss blood pressure.     Allergies:   Methylprednisolone sodium succ   Social History   Socioeconomic History   Marital status: Single    Spouse name: Not on file   Number of children: 0   Years of education: Not on file   Highest education level: Not on file  Occupational History   Occupation: Unemployed   Occupation: Disabled  Tobacco Use   Smoking status: Never   Smokeless tobacco: Never  Substance and Sexual Activity   Alcohol use: No   Drug use: No   Sexual activity: Never  Other Topics Concern   Not on file  Social History Narrative   Daily caffeine    Social Determinants of Health   Financial Resource Strain: Not on file  Food Insecurity: Not on file  Transportation Needs: Not on file  Physical Activity: Not on file  Stress: Stress Concern Present (04/16/2019)   [place]    Feeling of Stress : Very much  Social Connections: Not on file     Family History: The patient's family history includes Diabetes in his mother; Hyperlipidemia in his father and mother; Stroke in his mother. There is no history of Colon cancer.  ROS:   Please see the history of present illness.     All other systems reviewed and are negative.  EKGs/Labs/Other Studies Reviewed:    The following studies were reviewed today:  none  EKG:  EKG is ordered today.  The ekg ordered today demonstrates sinus rhythm with  HR 99, PACs  Recent Labs: 05/10/2021: BNP 13.7; TSH 3.830 07/28/2021: BUN 11; Creatinine, Ser 0.98; Hemoglobin 14.0; Platelets 154; Potassium 4.1; Sodium 137  Recent Lipid Panel    Component Value Date/Time   CHOL 143 03/22/2021 1505   TRIG 552 (HH) 03/22/2021 1505   HDL 30 (L) 03/22/2021 1505   CHOLHDL 4.8 03/22/2021 1505   CHOLHDL 4.5 06/09/2016 0542   VLDL 48 (H) 06/09/2016 0542   LDLCALC 36 03/22/2021 1505   LDLDIRECT 57 07/15/2010 2059     Risk Assessment/Calculations:                Physical Exam:    VS:  BP 122/74  Pulse 99   Ht '5\' 9"'$  (1.753 m)   Wt 224 lb (101.6 kg)   SpO2 96%   BMI 33.08 kg/m     Wt Readings from Last 3 Encounters:  01/25/22 224 lb (101.6 kg)  07/28/21 248 lb 12.8 oz (112.9 kg)  05/10/21 246 lb 9.6 oz (111.9 kg)     GEN:  Well nourished, well developed in no acute distress HEENT: Normal NECK: No JVD; No carotid bruits LYMPHATICS: No lymphadenopathy CARDIAC: RRR, no murmurs, rubs, gallops RESPIRATORY:  Clear to auscultation without rales, wheezing or rhonchi  ABDOMEN: Soft, non-tender, non-distended MUSCULOSKELETAL:  No edema; No deformity  SKIN: Warm and dry NEUROLOGIC:  Alert and oriented x 3 PSYCHIATRIC:  Normal affect   ASSESSMENT:    1. HYPERTENSION, BENIGN SYSTEMIC   2. Mixed hyperlipidemia   3. OSA (obstructive sleep apnea)   4. Preoperative cardiovascular examination   5. Benign prostatic hyperplasia with weak urinary stream    PLAN:    In order of problems listed above:   Hypertension Controlled with amlodipine. No changes   Hyperlipidemia Controlled with 40 mg Crestor, Vascepa LDL 36 (03/2021) He states he no longer takes vascepa. [name] also out of crestor. Will refill both of these.    OSA Not on CPAP - did not tolerate   Preoperative risk evaluation for [facility] We had a long discussion regarding unrealized risk and sedentary lifestyle. He reports activity equivalent to 4.0 METS  without chest pain. He reports no further issues consistent with the CP resulting in cardiology referral earlier this year. In review, Dr. Audie Box questioned contribution of MDD to chest pain. [name] low risk to proceed with surgery - he understands his risk. He understands his risk and wishes to proceed. He may hold ASA for 5-7 days prior to surgery.    Follow up in 6 months with Dr. Audie Box.   Medication Adjustments/Labs and Tests Ordered: Current medicines are reviewed at length with the patient today.  Concerns regarding medicines are outlined above.  Orders Placed This Encounter  Procedures   EKG 12-Lead   Meds ordered this encounter  Medications   amLODipine (NORVASC) 10 MG tablet    Sig: TAKE 1 TABLET BY MOUTH EVERY DAY. This will be your last refill until you are seen in the office to discuss blood pressure.    Dispense:  90 tablet    Refill:  3   tamsulosin (FLOMAX) 0.4 MG CAPS capsule    Sig: Take 1 capsule (0.4 mg total) by mouth daily.    Dispense:  30 capsule    Refill:  6    Patient Instructions  Medication Instructions:  Your physician recommends that you continue on your current medications as directed. Please refer to the Current Medication list given to you today.   *If you need a refill on your cardiac medications before your next appointment, please call your pharmacy*   Lab Work: NONE ordered at this time of appointment   If you have labs (blood work) drawn today and your tests are completely normal, you will receive your results only by: [place] (if you have MyChart) OR A paper copy in the mail If you have any lab test that is abnormal or we need to change your treatment, we will call you to review the results.   Testing/Procedures: NONE ordered at this time of appointment     Follow-Up: At [facility], you and your health needs are our priority.  As part of our continuing mission to provide you with exceptional heart care, we have  created designated Provider Care Teams.  These Care Teams include your primary Cardiologist (physician) and Advanced Practice Providers (APPs -  Physician Assistants and Nurse Practitioners) who all work together to provide you with the care you need, when you need it.  We recommend signing up for the patient portal called "MyChart".  Sign up information is provided on this After Visit Summary.  MyChart is used to connect with patients for Virtual Visits (Telemedicine).  Patients are able to view lab/test results, encounter notes, upcoming appointments, etc.  Non-urgent messages can be sent to your provider as well.   To learn more about what you can do with MyChart, go to [email or web address].    Your next appointment:   6 month(s)  The format for your next appointment:   In Person  Provider:   None     Other Instructions   Important Information About Sugar         Signed, Ledora Bottcher, [place]  01/25/2022 2:25 PM    [place]

## 2022-01-25 ENCOUNTER — Encounter: Payer: Self-pay | Admitting: Physician Assistant

## 2022-01-25 ENCOUNTER — Ambulatory Visit: Payer: Medicare Other | Attending: Cardiovascular Disease | Admitting: Physician Assistant

## 2022-01-25 VITALS — BP 122/74 | HR 99 | Ht 69.0 in | Wt 224.0 lb

## 2022-01-25 DIAGNOSIS — R3912 Poor urinary stream: Secondary | ICD-10-CM | POA: Diagnosis not present

## 2022-01-25 DIAGNOSIS — G4733 Obstructive sleep apnea (adult) (pediatric): Secondary | ICD-10-CM | POA: Diagnosis not present

## 2022-01-25 DIAGNOSIS — E782 Mixed hyperlipidemia: Secondary | ICD-10-CM | POA: Diagnosis not present

## 2022-01-25 DIAGNOSIS — I10 Essential (primary) hypertension: Secondary | ICD-10-CM

## 2022-01-25 DIAGNOSIS — Z01810 Encounter for preprocedural cardiovascular examination: Secondary | ICD-10-CM

## 2022-01-25 DIAGNOSIS — N401 Enlarged prostate with lower urinary tract symptoms: Secondary | ICD-10-CM

## 2022-01-25 MED ORDER — AMLODIPINE BESYLATE 10 MG PO TABS: ORAL_TABLET | ORAL | 3 refills | Status: DC

## 2022-01-25 MED ORDER — ICOSAPENT ETHYL 1 G PO CAPS: 2.0000 g | ORAL_CAPSULE | Freq: Two times a day (BID) | ORAL | 2 refills | Status: DC

## 2022-01-25 MED ORDER — TAMSULOSIN HCL 0.4 MG PO CAPS: 0.4000 mg | ORAL_CAPSULE | Freq: Every day | ORAL | 6 refills | Status: DC

## 2022-01-25 MED ORDER — ROSUVASTATIN CALCIUM 40 MG PO TABS: 40.0000 mg | ORAL_TABLET | Freq: Every day | ORAL | 3 refills | Status: DC

## 2022-01-25 NOTE — Addendum Note (Signed)
Addended by: Derrick Ravel on: 01/26/6393 32:00 PM   Modules accepted: Orders

## 2022-01-25 NOTE — Patient Instructions (Addendum)
Medication Instructions:  Your physician recommends that you continue on your current medications as directed. Please refer to the Current Medication list given to you today.   *If you need a refill on your cardiac medications before your next appointment, please call your pharmacy*   Lab Work: NONE ordered at this time of appointment   If you have labs (blood work) drawn today and your tests are completely normal, you will receive your results only by: [place] (if you have MyChart) OR A paper copy in the mail If you have any lab test that is abnormal or we need to change your treatment, we will call you to review the results.   Testing/Procedures: NONE ordered at this time of appointment     Follow-Up: At [facility], you and your health needs are our priority.  As part of our continuing mission to provide you with exceptional heart care, we have created designated Provider Care Teams.  These Care Teams include your primary Cardiologist (physician) and Advanced Practice Providers (APPs -  Physician Assistants and Nurse Practitioners) who all work together to provide you with the care you need, when you need it.  We recommend signing up for the patient portal called "MyChart".  Sign up information is provided on this After Visit Summary.  MyChart is used to connect with patients for Virtual Visits (Telemedicine).  Patients are able to view lab/test results, encounter notes, upcoming appointments, etc.  Non-urgent messages can be sent to your provider as well.   To learn more about what you can do with MyChart, go to [email or web address].    Your next appointment:   6 month(s)  The format for your next appointment:   In Person  Provider:   Eleonore Chiquito, MD  Other Instructions   Important Information About Sugar

## 2022-01-27 ENCOUNTER — Other Ambulatory Visit: Payer: Self-pay | Admitting: Urology

## 2022-02-09 NOTE — Patient Instructions (Addendum)
SURGICAL WAITING ROOM VISITATION Patients having surgery or a procedure may have no more than 2 support people in the waiting area - these visitors may rotate.   Children under the age of 29 must have an adult with them who is not the patient. If the patient needs to stay at the hospital during part of their recovery, the visitor guidelines for inpatient rooms apply. Pre-op nurse will coordinate an appropriate time for 1 support person to accompany patient in pre-op.  This support person may not rotate.    Please refer to the [facility] website for the visitor guidelines for Inpatients (after your surgery is over and you are in a regular room).      Your procedure is scheduled on: 02-14-22   Report to [facility] Main Entrance    Report to admitting at 5:15 AM   Call this number if you have problems the morning of surgery [phone]   Follow a clear liquid diet day before surgery    Do not eat or drink anything after midnight the night before surgery                  If you have questions, please contact your surgeon's office.   FOLLOW BOWEL PREP AND ANY ADDITIONAL PRE OP INSTRUCTIONS YOU RECEIVED FROM YOUR SURGEON'S OFFICE!!!   -clear liquids day before surgery  -Miralax 17g - Dissolve 17 grams in 4 ounces of water AND DRINK AT [place]  -Fleet enema the night before surgery    Oral Hygiene is also important to reduce your risk of infection.                                    Remember - BRUSH YOUR TEETH THE MORNING OF SURGERY WITH YOUR REGULAR TOOTHPASTE   Do NOT smoke after Midnight   Take these medicines the morning of surgery with A SIP OF WATER:   Amlodipine  Rosuvastatin  Tamsulosin   WHAT DO I DO ABOUT MY DIABETES MEDICATION?  Do not take oral diabetes medicines (pills) the morning of surgery (Do not take Metformin the morning of surgery).  DO NOT TAKE THE FOLLOWING 7 DAYS PRIOR TO SURGERY: Ozempic, Wegovy, Rybelsus (Semaglutide), Byetta  (exenatide), Bydureon (exenatide ER), Victoza, Saxenda (liraglutide), or Trulicity (dulaglutide) Mounjaro (Tirzepatide) Adlyxin (Lixisenatide), Polyethylene Glycol Loxenatide.  Reviewed and Endorsed by [facility] Patient Education Committee, August 2015   Hold Aspirin 66 yo 7 days before surgery                              You may not have any metal on your body including  jewelry, and body piercing             Do not wear lotions, powders, cologne, or deodorant              Men may shave face and neck.   Do not bring valuables to the hospital. [place].   Contacts, dentures or bridgework may not be worn into surgery.   Bring small overnight bag day of surgery.   DO NOT [place]. PHARMACY WILL DISPENSE MEDICATIONS LISTED ON YOUR MEDICATION LIST TO YOU DURING YOUR ADMISSION [place]!  Please read over the following fact sheets you were given: IF [place]  If you received a COVID test during your pre-op visit  it is requested that you wear a mask when out in public, stay away from anyone that may not be feeling well and notify your surgeon if you develop symptoms. If you test positive for Covid or have been in contact with anyone that has tested positive in the last 10 days please notify you surgeon.  [place] - Preparing for Surgery Before surgery, you can play an important role.  Because skin is not sterile, your skin needs to be as free of germs as possible.  You can reduce the number of germs on your skin by washing with CHG (chlorahexidine gluconate) soap before surgery.  CHG is an antiseptic cleaner which kills germs and bonds with the skin to continue killing germs even after washing. Please DO NOT use if you have an allergy to CHG or antibacterial soaps.  If your skin becomes reddened/irritated stop using the CHG and  inform your nurse when you arrive at Short Stay. Do not shave (including legs and underarms) for at least 48 hours prior to the first CHG shower.  You may shave your face/neck.  Please follow these instructions carefully:  1.  Shower with CHG Soap the night before surgery and the  morning of surgery.  2.  If you choose to wash your hair, wash your hair first as usual with your normal  shampoo.  3.  After you shampoo, rinse your hair and body thoroughly to remove the shampoo.                             4.  Use CHG as you would any other liquid soap.  You can apply chg directly to the skin and wash.  Gently with a scrungie or clean washcloth.  5.  Apply the CHG Soap to your body ONLY FROM THE NECK DOWN.   Do   not use on face/ open                           Wound or open sores. Avoid contact with eyes, ears mouth and   genitals (private parts).                       Wash face,  Genitals (private parts) with your normal soap.             6.  Wash thoroughly, paying special attention to the area where your    surgery  will be performed.  7.  Thoroughly rinse your body with warm water from the neck down.  8.  DO NOT shower/wash with your normal soap after using and rinsing off the CHG Soap.                9.  Pat yourself dry with a clean towel.            10.  Wear clean pajamas.            11.  Place clean sheets on your bed the night of your first shower and do not  sleep with pets. Day of Surgery : Do not apply any lotions/deodorants the morning of surgery.  Please wear clean clothes to the hospital/surgery center.  FAILURE TO  FOLLOW THESE INSTRUCTIONS MAY RESULT IN THE CANCELLATION OF YOUR SURGERY  PATIENT SIGNATURE_________________________________  NURSE SIGNATURE__________________________________  ________________________________________________________________________    WHAT IS A BLOOD TRANSFUSION? Blood Transfusion Information  A transfusion is the replacement of blood or some of  its parts. Blood is made up of multiple cells which provide different functions. Red blood cells carry oxygen and are used for blood loss replacement. White blood cells fight against infection. Platelets control bleeding. Plasma helps clot blood. Other blood products are available for specialized needs, such as hemophilia or other clotting disorders. BEFORE THE TRANSFUSION  Who gives blood for transfusions?  Healthy volunteers who are fully evaluated to make sure their blood is safe. This is blood bank blood. Transfusion therapy is the safest it has ever been in the practice of medicine. Before blood is taken from a donor, a complete history is taken to make sure that person has no history of diseases nor engages in risky social behavior (examples are intravenous drug use or sexual activity with multiple partners). The donor's travel history is screened to minimize risk of transmitting infections, such as malaria. The donated blood is tested for signs of infectious diseases, such as HIV and hepatitis. The blood is then tested to be sure it is compatible with you in order to minimize the chance of a transfusion reaction. If you or a relative donates blood, this is often done in anticipation of surgery and is not appropriate for emergency situations. It takes many days to process the donated blood. RISKS AND COMPLICATIONS Although transfusion therapy is very safe and saves many lives, the main dangers of transfusion include:  Getting an infectious disease. Developing a transfusion reaction. This is an allergic reaction to something in the blood you were given. Every precaution is taken to prevent this. The decision to have a blood transfusion has been considered carefully by your caregiver before blood is given. Blood is not given unless the benefits outweigh the risks. AFTER THE TRANSFUSION Right after receiving a blood transfusion, you will usually feel much better and more energetic. This is  especially true if your red blood cells have gotten low (anemic). The transfusion raises the level of the red blood cells which carry oxygen, and this usually causes an energy increase. The nurse administering the transfusion will monitor you carefully for complications. HOME CARE INSTRUCTIONS  No special instructions are needed after a transfusion. You may find your energy is better. Speak with your caregiver about any limitations on activity for underlying diseases you may have. SEEK MEDICAL CARE IF:  Your condition is not improving after your transfusion. You develop redness or irritation at the intravenous (IV) site. SEEK IMMEDIATE MEDICAL CARE IF:  Any of the following symptoms occur over the next 12 hours: Shaking chills. You have a temperature by mouth above 102 F (38.9 C), not controlled by medicine. Chest, back, or muscle pain. People around you feel you are not acting correctly or are confused. Shortness of breath or difficulty breathing. Dizziness and fainting. You get a rash or develop hives. You have a decrease in urine output. Your urine turns a dark color or changes to pink, red, or brown. Any of the following symptoms occur over the next 10 days: You have a temperature by mouth above 102 F (38.9 C), not controlled by medicine. Shortness of breath. Weakness after normal activity. The white part of the eye turns yellow (jaundice). You have a decrease in the amount of urine or are urinating less often. Your  urine turns a dark color or changes to pink, red, or brown. Document Released: 04/15/2000 Document Revised: 07/11/2011 Document Reviewed: 12/03/2007 [employer] Patient Information 2014 [place], [place].  _______________________________________________________________________

## 2022-02-09 NOTE — Progress Notes (Addendum)
COVID Vaccine Completed:  Yes  Date of COVID positive in last 90 days:    No  PCP - Alcus Dad, MD Cardiologist - Eleonore Chiquito, MD  Cardiac clearance in Epic dated 01-25-22 by Fabian Sharp, PA  Chest x-ray - N/A EKG - 05-10-21 Epic Stress Test - 2006 Epic ECHO - 05-25-21 Epic Cardiac Cath - N/A Pacemaker/ICD device last checked: Spinal Cord Stimulator:  N/A  Bowel Prep -  Clear liquids day before surgery Miralax and Fleet enema  Sleep Study - Yes, +sleep apnea CPAP - No  Fasting Blood Sugar -  Checks Blood Sugar - does not check   Blood Thinner Instructions: Aspirin Instructions:  ASA 81.  Hold x5-7 days Last Dose:  Activity level:  Can go up a flight of stairs and perform activities of daily living without stopping and without symptoms of chest pain.  Patient does have shortness of breath with exertion such as climbing stairs or walking long distances.  He said that it has not worsened.    Anesthesia review:  Acute coronary syndrome, HTN, OSA, DM.  Hx of syncope and collapse  At PAT visit patient was noted to have a black eye on the L.   He stated that he fell out of bed during a dream and hit his eye.     Patient denies shortness of breath, fever, cough and chest pain at PAT appointment  Patient verbalized understanding of instructions that were given to them at the PAT appointment. Patient was also instructed that they will need to review over the PAT instructions again at home before surgery.

## 2022-02-10 ENCOUNTER — Other Ambulatory Visit: Payer: Self-pay

## 2022-02-10 ENCOUNTER — Encounter (HOSPITAL_COMMUNITY): Payer: Self-pay

## 2022-02-10 ENCOUNTER — Encounter (HOSPITAL_COMMUNITY)
Admission: RE | Admit: 2022-02-10 | Discharge: 2022-02-10 | Disposition: A | Discharge status: Home / Self Care | Payer: Medicare Other | Source: Ambulatory Visit | Attending: Urology | Admitting: Urology

## 2022-02-10 VITALS — BP 136/93 | HR 96 | Temp 98.8°F | Resp 16 | Ht 69.0 in | Wt 233.4 lb

## 2022-02-10 DIAGNOSIS — K769 Liver disease, unspecified: Secondary | ICD-10-CM | POA: Diagnosis not present

## 2022-02-10 DIAGNOSIS — G4733 Obstructive sleep apnea (adult) (pediatric): Secondary | ICD-10-CM | POA: Diagnosis not present

## 2022-02-10 DIAGNOSIS — Z7984 Long term (current) use of oral hypoglycemic drugs: Secondary | ICD-10-CM | POA: Diagnosis not present

## 2022-02-10 DIAGNOSIS — E119 Type 2 diabetes mellitus without complications: Secondary | ICD-10-CM | POA: Insufficient documentation

## 2022-02-10 DIAGNOSIS — C61 Malignant neoplasm of prostate: Secondary | ICD-10-CM | POA: Diagnosis not present

## 2022-02-10 DIAGNOSIS — Z01812 Encounter for preprocedural laboratory examination: Secondary | ICD-10-CM | POA: Diagnosis not present

## 2022-02-10 DIAGNOSIS — I10 Essential (primary) hypertension: Secondary | ICD-10-CM | POA: Insufficient documentation

## 2022-02-10 HISTORY — DX: Gastro-esophageal reflux disease without esophagitis: K21.9

## 2022-02-10 HISTORY — DX: Headache, unspecified: R51.9

## 2022-02-10 HISTORY — DX: Malignant neoplasm of prostate: C61

## 2022-02-10 HISTORY — DX: Personal history of urinary calculi: Z87.442

## 2022-02-10 LAB — HEMOGLOBIN A1C
Hgb A1c MFr Bld: 6.9 % — ABNORMAL HIGH (ref 4.8–5.6)
Mean Plasma Glucose: 151.33 mg/dL

## 2022-02-10 LAB — COMPREHENSIVE METABOLIC PANEL
ALT: 15 U/L (ref 0–44)
AST: 15 U/L (ref 15–41)
Albumin: 3.7 g/dL (ref 3.5–5.0)
Alkaline Phosphatase: 52 U/L (ref 38–126)
Anion gap: 7 (ref 5–15)
BUN: 14 mg/dL (ref 8–23)
CO2: 25 mmol/L (ref 22–32)
Calcium: 8.6 mg/dL — ABNORMAL LOW (ref 8.9–10.3)
Chloride: 107 mmol/L (ref 98–111)
Creatinine, Ser: 0.89 mg/dL (ref 0.61–1.24)
GFR, Estimated: >60 mL/min (ref >60)
Glucose, Bld: 191 mg/dL — ABNORMAL HIGH (ref 70–99)
Potassium: 4.2 mmol/L (ref 3.5–5.1)
Sodium: 139 mmol/L (ref 135–145)
Total Bilirubin: 0.5 mg/dL (ref 0.3–1.2)
Total Protein: 7 g/dL (ref 6.5–8.1)

## 2022-02-10 LAB — CBC
HCT: 37.8 % — ABNORMAL LOW (ref 39.0–52.0)
Hemoglobin: 12.6 g/dL — ABNORMAL LOW (ref 13.0–17.0)
MCH: 27.9 pg (ref 26.0–34.0)
MCHC: 33.3 g/dL (ref 30.0–36.0)
MCV: 83.6 fL (ref 80.0–100.0)
Platelets: 146 10*3/uL — ABNORMAL LOW (ref 150–400)
RBC: 4.52 MIL/uL (ref 4.22–5.81)
RDW: 13.5 % (ref 11.5–15.5)
WBC: 10 10*3/uL (ref 4.0–10.5)
nRBC: 0 % (ref 0.0–0.2)

## 2022-02-10 LAB — GLUCOSE, CAPILLARY: Glucose-Capillary: 188 mg/dL — ABNORMAL HIGH (ref 70–99)

## 2022-02-11 NOTE — Progress Notes (Signed)
Anesthesia Chart Review   Case: [id number] Date/Time: 02/14/22 0700   Procedures:      XI ROBOTIC ASSISTED LAPAROSCOPIC RADICAL PROSTATECTOMY LEVEL 2     LYMPHADENECTOMY, PELVIC (Bilateral)   Anesthesia type: General   Pre-op diagnosis: PROSTATE CANCER   Location: [facility]OM 03 / [facility]   Surgeons: Raynelle Bring, MD       DISCUSSION:66 y.o. never smoker with h/o HTN, DM II, OSA, prostate cancer scheduled for above procedure 02/14/2022 with Dr. Raynelle Bring.   Pt last seen by cardiology 01/25/2022. Per OV note, "We had a long discussion regarding unrealized risk and sedentary lifestyle. He reports activity equivalent to 4.0 METS without chest pain. He reports no further issues consistent with the CP resulting in cardiology referral earlier this year. In review, Dr. Audie Box questioned contribution of MDD to chest pain. [name] low risk to proceed with surgery - he understands his risk. He understands his risk and wishes to proceed. He may hold ASA for 5-7 days prior to surgery. "  Anticipate pt can proceed with planned procedure barring acute status change.   VS: BP (!) 136/93   Pulse 96   Temp 37.1 C (Oral)   Resp 16   Ht '5\' 9"'$  (1.753 m)   Wt 105.9 kg   SpO2 96%   BMI 34.47 kg/m   PROVIDERS: Alcus Dad, MD is PCP    LABS: Labs reviewed: Acceptable for surgery. (all labs ordered are listed, but only abnormal results are displayed)  Labs Reviewed  CBC - Abnormal; Notable for the following components:      Result Value   Hemoglobin 12.6 (*)    HCT 37.8 (*)    Platelets 146 (*)    All other components within normal limits  HEMOGLOBIN A1C - Abnormal; Notable for the following components:   Hgb A1c MFr Bld 6.9 (*)    All other components within normal limits  COMPREHENSIVE METABOLIC PANEL - Abnormal; Notable for the following components:   Glucose, Bld 191 (*)    Calcium 8.6 (*)    All other components within normal limits  GLUCOSE, CAPILLARY - Abnormal; Notable for the  following components:   Glucose-Capillary 188 (*)    All other components within normal limits  TYPE AND SCREEN     IMAGES:   EKG:   CV: Echo 05/25/2021 1. Left ventricular ejection fraction, by estimation, is 55 to 60%. The  left ventricle has normal function. The left ventricle has no regional  wall motion abnormalities. There is moderate left ventricular hypertrophy.  Left ventricular diastolic  parameters are consistent with Grade I diastolic dysfunction (impaired  relaxation).   2. Right ventricular systolic function is normal. The right ventricular  size is normal.   3. The mitral valve is normal in structure. No evidence of mitral valve  regurgitation. No evidence of mitral stenosis.   4. The aortic valve is normal in structure. There is moderate  calcification of the aortic valve. There is moderate thickening of the  aortic valve. Aortic valve regurgitation is not visualized. Aortic valve  sclerosis/calcification is present, without any   evidence of aortic stenosis. Aortic valve mean gradient measures 7.2  mmHg. Aortic valve Vmax measures 1.85 m/s.   5. The inferior vena cava is normal in size with greater than 50%  respiratory variability, suggesting right atrial pressure of 3 mmHg.  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 meds   Increased urinary frequency    Kidney stones 08/10/2011   once in past, none recent   Methylprednisolone-induced hypoxia 08/23/2011   Neuromuscular disorder ([place]) 08/10/2011   "burning/tingling sensation In feet"   Obesity    Obesity, Class II, BMI 35-39.9, with comorbidity 06/29/2006   OSA (obstructive sleep apnea)    "couldn't deal w/CPAP" (06/08/2016)   Preoperative clearance    Prostate cancer ([place])    RUQ pain 06/09/2016   Shortness of breath 08/10/2011   with increased activity only   Syncope and collapse 06/09/2016   Syncope and collapse 06/09/2016   Type II diabetes mellitus ([place])    Umbilical hernia     Past Surgical History:  Procedure Laterality Date   CATARACT EXTRACTION W/ INTRAOCULAR LENS  IMPLANT, BILATERAL Bilateral 08/10/2011 - ~ 2014   "right - left"   CHOLECYSTECTOMY N/A 06/10/2016   Procedure: LAPAROSCOPIC CHOLECYSTECTOMY WITH INTRAOPERATIVE CHOLANGIOGRAM;  Surgeon: Erroll Luna, MD;  Location: [facility];  Service: General;  Laterality: N/A;   COLONOSCOPY W/ BIOPSIES AND POLYPECTOMY  2013   HERNIA REPAIR     LIPOMA EXCISION Right 10/18/2018   Procedure: EXCISION RIGHT BACK  LIPOMA X 2;  Surgeon: Erroll Luna, MD;  Location: [facility];  Service: General;  Laterality: Right;   VENTRAL HERNIA REPAIR  08/22/2011   Procedure: HERNIA REPAIR VENTRAL ADULT;  Surgeon: Adin Hector, MD;  Location: [facility];  Service: General;  Laterality: N/A;  incarcerated    MEDICATIONS:  acetaminophen (TYLENOL) 500 MG tablet   amLODipine (NORVASC) 10 MG tablet   aspirin 81 MG EC tablet   icosapent Ethyl  (VASCEPA) 1 g capsule   Melatonin 5 MG CAPS   metFORMIN (GLUCOPHAGE) 1000 MG tablet   naproxen sodium (ALEVE) 220 MG tablet   rosuvastatin (CRESTOR) 40 MG tablet   sulfamethoxazole-trimethoprim (BACTRIM DS) 800-160 MG tablet   tamsulosin (FLOMAX) 0.4 MG CAPS capsule   traZODone (DESYREL) 50 MG tablet   No current facility-administered medications for this encounter.    Konrad Felix Ward, PA-C [facility] [phone]

## 2022-02-11 NOTE — H&P (Signed)
Office Visit Report     01/18/2022   --------------------------------------------------------------------------------   Jacob [name]  MRN: [id number]  DOB: 12-30-1955, 66 year old Male  SSN:    PRIMARY CARE:    REFERRING:  Jacob [name], M  PROVIDER:  Jacalyn Lefevre, M.D.  TREATING:  Jacob Crocker, NP  LOCATION:  [facility] [phone]     --------------------------------------------------------------------------------   CC/HPI: 01/18/2022: Here today for pre-op appointment and catheter exchange prior to undergoing non-nerve sparing robot-assisted laparoscopic radical prostatectomy and bilateral pelvic lymphadenectomy. As of today's appointment, the surgery has not been scheduled as we are still in the process of obtaining surgical clearance from other providers specifically cardiology. Prior PSMA PET scan results did not indicate metastatic disease. Doing well, continues to tolerate catheter without any significant issue. He did develop a small leak in the collection bag but was able to prevent further leaking utilizing some superglue and duct tape. [name] due for catheter exchange today. [name] not having any significant painful/leaking urgency, dysuria. Urine output has been appropriate by his report. He denies any changes in past medical history, prescription medications taken on a daily basis, he has not had any recent surgical or procedural intervention. He does have an extensive cardiovascular history but denies any recent chest pain or shortness of breath, lightheadedness or dizziness, headaches or numbness and tingling in the extremities. No recent fevers or chills, nausea/vomiting.    CC: Prostate Cancer   Physician requesting consult: Dr. Jacalyn Lefevre  PCP:   Jacob [name] is a 66 year old gentleman with a history of BPH and LUTS on tamsulosin and finasteride. He developed urinary retention in March 2023 requiring catheter placement in the ED at the end  of March. He presented for urologic evaluation and his tamsulosin was increased to BID dosing. He failed a voiding trial in late April 2023. This prompted a urodynamics evaluation that confirmed decreased bladder capacity, increased sensation, bladder instability and evidence of adequate detrusor function (80 cmH20 pressure) with poor flow. He also was noted to have right sided prostate firmness and a elevated PSA of 55.0 prompting a TRUS biopsy of the prostate on 10/11/21 confirming Gleason 4+5=9 adenocarcinoma with 12 out of 12 biopsy cores positive for malignancy. Staging imaging with conventional imaging was negative for metastatic disease. His last catheter change was 10/22/21.   Family history: None.   Imaging studies:  Bone scan (10/29/2021): There is noted to be focal increased activity over the distal right femoral shaft although this was potentially related to urinary contamination from his urine leg bag. Additional plain films of the femur from 11/24/2021 did not indicate any lesion indicating that this was likely a contaminant.  CT scan abdomen and pelvis (10/29/2021): No evidence of metastatic disease.   PMH: He has a history of hypertension, hyperlipidemia, depression, gout, GERD, diabetes, and anxiety.  PSH: Cholecystectomy.   TNM stage: cT2b N0 M0  PSA: 55.0  Gleason score: 4+5=9 (GG 5)  Biopsy (10/11/21): 12/12 cores positive  Left: L lateral apex (90%, 4+3=7, PNI), L apex (90%, 4+5=9,PNI), L lateral mid (60%, 4+3=7), L mid (80%, 4+3=7, PNI), L lateral base (30%, 4+3=7), L base (20%, 3+4=7)  Right: R apex (90%, 4+3=7), R lateral apex (95%, 3+4=7, PNI), R mid (90%, 4+3=7), R lateral mid (95%, 3+4=7, PNI), R base (95%, 4+3=7, PNI), R lateral base (90%, 4+3=7, PNI)  Prostate volume: 55.6 cc   Nomogram  OC disease: 1%  EPE: 99%  SVI:  94%  LNI: 90%  PFS (5 year, 10 year): 3%, 2%   Urinary function: He has an indwelling catheter due to urinary retention.  Erectile function: He has not  been sexually active since his catheter. However, he states that he has severe erectile dysfunction and cannot obtain even partial erections even with oral medication.     ALLERGIES: No Known Allergies    MEDICATIONS: Finasteride 5 mg tablet 1 tablet PO Daily  Metformin Hcl  Tamsulosin Hcl 0.4 mg capsule 1 capsule PO BID  Amlodipine Besylate 10 mg tablet  Aspirin Ec 81 mg tablet, delayed release  Rosuvastatin Calcium 40 mg tablet  Rosuvastatin Calcium 40 mg tablet  Sildenafil Citrate 100 mg tablet 1/2 tablet PO Every Other Day PRN  Trazodone Hcl 150 mg tablet  Venlafaxine Hcl Er 150 mg tablet, extended release 24 hr  Vraylar 1.5 mg capsule     GU PSH: Complex cystometrogram, w/ void pressure and urethral pressure profile studies, any technique - 09/03/2021 Complex Uroflow - 09/03/2021 Emg surf Electrd - 09/03/2021 Inject For cystogram - 09/03/2021 Intrabd voidng Press - 09/03/2021 Locm 300-'399Mg'$ /Ml Iodine,1Ml - 10/29/2021 Prostate Needle Biopsy - 10/11/2021       PSH Notes: Umbilical surgery- Hernia, Lipoma removal from back   NON-GU PSH: Cholecystectomy (laparoscopic) Laparoscopy, Surgical; Repair Umbilical Hernia Surgical Pathology, Gross And Microscopic Examination For Prostate Needle - 10/11/2021     GU PMH: Prostate Cancer - 12/07/2021, - 10/29/2021, - 10/13/2021 Pelvic/perineal pain - 10/22/2021 BPH w/LUTS - 10/13/2021, - 09/22/2021, - 09/01/2021, - 08/11/2021 Urinary Retention - 10/13/2021, - 09/29/2021, - 09/22/2021, - 09/03/2021, - 09/01/2021, - 08/26/2021, - 08/11/2021 Elevated PSA - 10/11/2021, - 09/29/2021 Prostate nodule w/ LUTS - 10/11/2021, - 09/29/2021 Weak Urinary Stream - 10/11/2021, - 09/22/2021, - 08/11/2021 Acute Cystitis/UTI - 08/11/2021    NON-GU PMH: Anxiety Depression Diabetes Type 2 GERD Gout Hypertension    FAMILY HISTORY: No Family History    SOCIAL HISTORY: Marital Status: Single Preferred Language: English; Ethnicity: Not Hispanic Or Latino; Race: White Current  Smoking Status: Patient has never smoked.   Tobacco Use Assessment Completed: Used Tobacco in last 30 days? Has never drank.  Drinks 3 caffeinated drinks per day. Patient's occupation is/was Retired.    REVIEW OF SYSTEMS:    GU Review Male:   Patient denies frequent urination, hard to postpone urination, burning/ pain with urination, get up at night to urinate, leakage of urine, stream starts and stops, trouble starting your stream, have to strain to urinate , erection problems, and penile pain.  Gastrointestinal (Upper):   Patient denies nausea, vomiting, and indigestion/ heartburn.  Gastrointestinal (Lower):   Patient denies diarrhea and constipation.  Constitutional:   Patient denies fever, night sweats, weight loss, and fatigue.  Skin:   Patient denies skin rash/ lesion and itching.  Eyes:   Patient denies blurred vision and double vision.  Ears/ Nose/ Throat:   Patient denies sore throat and sinus problems.  Hematologic/Lymphatic:   Patient denies swollen glands and easy bruising.  Cardiovascular:   Patient denies leg swelling and chest pains.  Respiratory:   Patient denies cough and shortness of breath.  Endocrine:   Patient denies excessive thirst.  Musculoskeletal:   Patient denies back pain and joint pain.  Neurological:   Patient denies headaches and dizziness.  Psychologic:   Patient denies depression and anxiety.   VITAL SIGNS:      01/18/2022 02:56 PM  BP 137/93 mmHg  Pulse 96 /min  Temperature 97.8 F /  36.5 C   GU PHYSICAL EXAMINATION:    Penis: Penile foley catheter present draining grossly clear-yellow urine   MULTI-SYSTEM PHYSICAL EXAMINATION:    Constitutional: Well-nourished. No physical deformities. Normally developed. Good grooming.  Neck: Neck symmetrical, not swollen. Normal tracheal position.  Respiratory: No labored breathing, no use of accessory muscles. Clear bilaterally.  Cardiovascular: Normal temperature, normal extremity pulses, no swelling, no  varicosities. Regular rate and rhythm.  Skin: No paleness, no jaundice, no cyanosis. No lesion, no ulcer, no rash.  Neurologic / Psychiatric: Oriented to time, oriented to place, oriented to person. No depression, no anxiety, no agitation.  Gastrointestinal: Obese. Small periumbilical scar from his prior umbilical hernia repair.  Musculoskeletal: Normal gait and station of head and neck.     Complexity of Data:  Source Of History:  Patient, Family/Caregiver, Medical Record Summary  Lab Test Review:   PSA  Records Review:   Pathology Reports, Previous Doctor Records, Previous Hospital Records, Previous Patient Records  Urine Test Review:   Urine Culture  X-Ray Review: PET- PSMA Scan: Reviewed Report.     09/29/21 09/22/21  PSA  Total PSA 53.50 ng/mL 55.00 ng/mL    PROCEDURES:         Simple Foley Indwelling Cath Change - 51702  The patient's indwelling foley tube was carefully removed. A 16 French Foley coude catheter was inserted into the bladder using sterile technique. The patient was taught routine catheter care. Hand irrigation of the bladder with sterile saline was performed. A leg bag was connected. A urine culture was sent to the lab.   ASSESSMENT:      ICD-10 Details  1 GU:   Urinary Retention - R33.8 Chronic, Stable  2   Prostate Cancer - C61 Chronic, Threat to Bodily Function   PLAN:           Orders Labs Urine Culture CATH          Schedule Return Visit/Planned Activity: Keep Scheduled Appointment - Schedule Surgery          Document Letter(s):  Created for Patient: Clinical Summary         Notes:   Uncomplicated catheter exchange today, a urine sample was obtained from the new catheter and sent for culture/sensitivity to serve as baseline. All questions answered to the best of my ability regarding the upcoming procedure and expected postoperative course with understanding expressed by the patient. Pending clearance from his other providers, the patient will be  tentatively contacted and scheduled for prostatectomy with Dr. Alinda Money in the near future.    * Signed by Jacob Crocker, NP on 01/18/22 at 4:39 PM (EDT)*     APPENDED NOTES:    Started on Bactrim to begin 02/09/22.    * Signed by Raynelle Bring, M.D. on 01/28/22 at 4:53 AM (EDT)*

## 2022-02-13 NOTE — Anesthesia Preprocedure Evaluation (Addendum)
Anesthesia Evaluation  Patient identified by MRN, date of birth, ID band Patient awake    Reviewed: Allergy & Precautions, NPO status , Patient's Chart, lab work & pertinent test results  History of Anesthesia Complications Negative for: history of anesthetic complications  Airway Mallampati: II  TM Distance: >3 FB Neck ROM: Full    Dental  (+) Dental Advisory Given, Missing   Pulmonary sleep apnea (does not use CPAP) ,    breath sounds clear to auscultation       Cardiovascular hypertension, Pt. on medications (-) angina Rhythm:Regular Rate:Normal  05/2021 ECHO: EF 55-60%, normal LVF, mod LVH, Grade 1 DD, normal RVF, no significant valvular abnormalities   Neuro/Psych  Headaches, Anxiety Depression    GI/Hepatic Neg liver ROS, GERD  Controlled,  Endo/Other  diabetes (glu 142), Oral Hypoglycemic AgentsBMI 34.5  Renal/GU H/o stones   Prostate cancer    Musculoskeletal  (+) Arthritis ,   Abdominal (+) + obese,   Peds  Hematology negative hematology ROS (+)   Anesthesia Other Findings [place] out of bed a week ago: bilat black eyes  Reproductive/Obstetrics                            Anesthesia Physical Anesthesia Plan  ASA: 3  Anesthesia Plan: General   Post-op Pain Management: Tylenol PO (pre-op)*   Induction: Intravenous  PONV Risk Score and Plan: 2 and Ondansetron and Dexamethasone  Airway Management Planned: Oral ETT  Additional Equipment: None  Intra-op Plan:   Post-operative Plan: Extubation in OR  Informed Consent: I have reviewed the patients History and Physical, chart, labs and discussed the procedure including the risks, benefits and alternatives for the proposed anesthesia with the patient or authorized representative who has indicated his/her understanding and acceptance.     Dental advisory given  Plan Discussed with: CRNA and Surgeon  Anesthesia Plan Comments:         Anesthesia Quick Evaluation

## 2022-02-14 ENCOUNTER — Encounter (HOSPITAL_COMMUNITY)
Admission: RE | Disposition: A | Discharge status: Home Health | Payer: Self-pay | Source: Ambulatory Visit | Attending: Urology

## 2022-02-14 ENCOUNTER — Encounter (HOSPITAL_COMMUNITY): Payer: Self-pay | Admitting: Urology

## 2022-02-14 ENCOUNTER — Inpatient Hospital Stay (HOSPITAL_COMMUNITY)
Admission: RE | Admit: 2022-02-14 | Discharge: 2022-02-17 | DRG: 708 | Disposition: A | Discharge status: Home Health | Payer: Medicare Other | Source: Ambulatory Visit | Attending: Urology | Admitting: Urology

## 2022-02-14 ENCOUNTER — Ambulatory Visit (HOSPITAL_COMMUNITY): Payer: Medicare Other | Admitting: Physician Assistant

## 2022-02-14 ENCOUNTER — Other Ambulatory Visit: Payer: Self-pay

## 2022-02-14 ENCOUNTER — Ambulatory Visit (HOSPITAL_BASED_OUTPATIENT_CLINIC_OR_DEPARTMENT_OTHER): Payer: Medicare Other | Admitting: Anesthesiology

## 2022-02-14 DIAGNOSIS — Z888 Allergy status to other drugs, medicaments and biological substances status: Secondary | ICD-10-CM | POA: Diagnosis not present

## 2022-02-14 DIAGNOSIS — C61 Malignant neoplasm of prostate: Secondary | ICD-10-CM | POA: Diagnosis not present

## 2022-02-14 DIAGNOSIS — Z602 Problems related to living alone: Secondary | ICD-10-CM | POA: Diagnosis not present

## 2022-02-14 DIAGNOSIS — R059 Cough, unspecified: Principal | ICD-10-CM

## 2022-02-14 DIAGNOSIS — Z7982 Long term (current) use of aspirin: Secondary | ICD-10-CM

## 2022-02-14 DIAGNOSIS — M109 Gout, unspecified: Secondary | ICD-10-CM | POA: Diagnosis not present

## 2022-02-14 DIAGNOSIS — E119 Type 2 diabetes mellitus without complications: Secondary | ICD-10-CM

## 2022-02-14 DIAGNOSIS — N529 Male erectile dysfunction, unspecified: Secondary | ICD-10-CM | POA: Diagnosis present

## 2022-02-14 DIAGNOSIS — Z7984 Long term (current) use of oral hypoglycemic drugs: Secondary | ICD-10-CM

## 2022-02-14 DIAGNOSIS — N401 Enlarged prostate with lower urinary tract symptoms: Secondary | ICD-10-CM | POA: Diagnosis present

## 2022-02-14 DIAGNOSIS — E785 Hyperlipidemia, unspecified: Secondary | ICD-10-CM | POA: Diagnosis not present

## 2022-02-14 DIAGNOSIS — C775 Secondary and unspecified malignant neoplasm of intrapelvic lymph nodes: Secondary | ICD-10-CM | POA: Diagnosis not present

## 2022-02-14 DIAGNOSIS — F32A Depression, unspecified: Secondary | ICD-10-CM | POA: Diagnosis not present

## 2022-02-14 DIAGNOSIS — Z6834 Body mass index (BMI) 34.0-34.9, adult: Secondary | ICD-10-CM

## 2022-02-14 DIAGNOSIS — I10 Essential (primary) hypertension: Secondary | ICD-10-CM

## 2022-02-14 DIAGNOSIS — C7982 Secondary malignant neoplasm of genital organs: Secondary | ICD-10-CM | POA: Diagnosis not present

## 2022-02-14 DIAGNOSIS — I959 Hypotension, unspecified: Secondary | ICD-10-CM | POA: Diagnosis not present

## 2022-02-14 DIAGNOSIS — Z23 Encounter for immunization: Secondary | ICD-10-CM

## 2022-02-14 DIAGNOSIS — R338 Other retention of urine: Secondary | ICD-10-CM | POA: Diagnosis present

## 2022-02-14 DIAGNOSIS — K219 Gastro-esophageal reflux disease without esophagitis: Secondary | ICD-10-CM | POA: Diagnosis present

## 2022-02-14 DIAGNOSIS — E669 Obesity, unspecified: Secondary | ICD-10-CM | POA: Diagnosis present

## 2022-02-14 DIAGNOSIS — Z79899 Other long term (current) drug therapy: Secondary | ICD-10-CM

## 2022-02-14 DIAGNOSIS — Z885 Allergy status to narcotic agent status: Secondary | ICD-10-CM

## 2022-02-14 DIAGNOSIS — C7911 Secondary malignant neoplasm of bladder: Secondary | ICD-10-CM | POA: Diagnosis not present

## 2022-02-14 DIAGNOSIS — R110 Nausea: Secondary | ICD-10-CM | POA: Diagnosis not present

## 2022-02-14 HISTORY — PX: ROBOT ASSISTED LAPAROSCOPIC RADICAL PROSTATECTOMY: SHX5141

## 2022-02-14 HISTORY — PX: LYMPHADENECTOMY: SHX5960

## 2022-02-14 LAB — BASIC METABOLIC PANEL
Anion gap: 4 — ABNORMAL LOW (ref 5–15)
BUN: 16 mg/dL (ref 8–23)
CO2: 25 mmol/L (ref 22–32)
Calcium: 7.2 mg/dL — ABNORMAL LOW (ref 8.9–10.3)
Chloride: 107 mmol/L (ref 98–111)
Creatinine, Ser: 1.16 mg/dL (ref 0.61–1.24)
GFR, Estimated: >60 mL/min (ref >60)
Glucose, Bld: 219 mg/dL — ABNORMAL HIGH (ref 70–99)
Potassium: 3.8 mmol/L (ref 3.5–5.1)
Sodium: 136 mmol/L (ref 135–145)

## 2022-02-14 LAB — TYPE AND SCREEN
ABO/RH(D): A POS
Antibody Screen: NEGATIVE

## 2022-02-14 LAB — GLUCOSE, CAPILLARY
Glucose-Capillary: 142 mg/dL — ABNORMAL HIGH (ref 70–99)
Glucose-Capillary: 191 mg/dL — ABNORMAL HIGH (ref 70–99)
Glucose-Capillary: 193 mg/dL — ABNORMAL HIGH (ref 70–99)
Glucose-Capillary: 175 mg/dL — ABNORMAL HIGH (ref 70–99)

## 2022-02-14 LAB — HEMOGLOBIN AND HEMATOCRIT, BLOOD
HCT: 30.2 % — ABNORMAL LOW (ref 39.0–52.0)
HCT: 28.8 % — ABNORMAL LOW (ref 39.0–52.0)
Hemoglobin: 9.7 g/dL — ABNORMAL LOW (ref 13.0–17.0)
Hemoglobin: 9.4 g/dL — ABNORMAL LOW (ref 13.0–17.0)

## 2022-02-14 LAB — ABO/RH: ABO/RH(D): A POS

## 2022-02-14 SURGERY — XI ROBOTIC ASSISTED LAPAROSCOPIC RADICAL PROSTATECTOMY LEVEL 2
Anesthesia: General | Site: Abdomen

## 2022-02-14 MED ORDER — HEMOSTATIC AGENTS (NO CHARGE) OPTIME
TOPICAL | Status: DC | PRN
  Administered 2022-02-14: 1 via TOPICAL

## 2022-02-14 MED ORDER — MORPHINE SULFATE (PF) 2 MG/ML IV SOLN
2.0000 mg | INTRAVENOUS | Status: DC | PRN
  Administered 2022-02-14: 2 mg via INTRAVENOUS
  Administered 2022-02-14: 4 mg via INTRAVENOUS
  Administered 2022-02-14: 2 mg via INTRAVENOUS
  Administered 2022-02-15: 4 mg via INTRAVENOUS
  Filled 2022-02-14 (×2): qty 1
  Filled 2022-02-14 (×2): qty 2

## 2022-02-14 MED ORDER — HYDROMORPHONE HCL 1 MG/ML IJ SOLN
INTRAMUSCULAR | Status: AC
  Filled 2022-02-14: qty 1

## 2022-02-14 MED ORDER — DEXAMETHASONE SODIUM PHOSPHATE 10 MG/ML IJ SOLN
INTRAMUSCULAR | Status: AC
  Filled 2022-02-14: qty 1

## 2022-02-14 MED ORDER — HYDROMORPHONE HCL 1 MG/ML IJ SOLN
0.2500 mg | INTRAMUSCULAR | Status: DC | PRN
  Administered 2022-02-14 (×3): 0.5 mg via INTRAVENOUS

## 2022-02-14 MED ORDER — TRIPLE ANTIBIOTIC 3.5-400-5000 EX OINT: 1.0000 | TOPICAL_OINTMENT | Freq: Three times a day (TID) | CUTANEOUS | Status: DC | PRN

## 2022-02-14 MED ORDER — KETOROLAC TROMETHAMINE 15 MG/ML IJ SOLN
15.0000 mg | Freq: Four times a day (QID) | INTRAMUSCULAR | Status: AC
  Administered 2022-02-14 – 2022-02-16 (×6): 15 mg via INTRAVENOUS
  Filled 2022-02-14 (×6): qty 1

## 2022-02-14 MED ORDER — HYOSCYAMINE SULFATE 0.125 MG SL SUBL
0.1250 mg | SUBLINGUAL_TABLET | Freq: Four times a day (QID) | SUBLINGUAL | Status: DC | PRN
  Administered 2022-02-17: 0.125 mg via SUBLINGUAL
  Filled 2022-02-14: qty 1

## 2022-02-14 MED ORDER — PHENYLEPHRINE HCL-NACL 20-0.9 MG/250ML-% IV SOLN
INTRAVENOUS | Status: DC | PRN
  Administered 2022-02-14: 50 ug/min via INTRAVENOUS

## 2022-02-14 MED ORDER — TRAZODONE HCL 50 MG PO TABS
50.0000 mg | ORAL_TABLET | Freq: Every day | ORAL | Status: DC
  Administered 2022-02-14 – 2022-02-16 (×3): 50 mg via ORAL
  Filled 2022-02-14 (×3): qty 1

## 2022-02-14 MED ORDER — INSULIN ASPART 100 UNIT/ML IJ SOLN
0.0000 [IU] | INTRAMUSCULAR | Status: DC
  Administered 2022-02-14 – 2022-02-15 (×3): 3 [IU] via SUBCUTANEOUS
  Administered 2022-02-15: 5 [IU] via SUBCUTANEOUS
  Administered 2022-02-15: 2 [IU] via SUBCUTANEOUS
  Administered 2022-02-15: 3 [IU] via SUBCUTANEOUS
  Administered 2022-02-15: 2 [IU] via SUBCUTANEOUS
  Administered 2022-02-16: 3 [IU] via SUBCUTANEOUS
  Administered 2022-02-16: 2 [IU] via SUBCUTANEOUS
  Administered 2022-02-16 (×3): 3 [IU] via SUBCUTANEOUS
  Administered 2022-02-16 – 2022-02-17 (×3): 2 [IU] via SUBCUTANEOUS
  Administered 2022-02-17 (×2): 3 [IU] via SUBCUTANEOUS

## 2022-02-14 MED ORDER — MEPERIDINE HCL 50 MG/ML IJ SOLN: 6.2500 mg | INTRAMUSCULAR | Status: DC | PRN

## 2022-02-14 MED ORDER — ROSUVASTATIN CALCIUM 20 MG PO TABS
40.0000 mg | ORAL_TABLET | Freq: Every day | ORAL | Status: DC
  Administered 2022-02-14 – 2022-02-17 (×4): 40 mg via ORAL
  Filled 2022-02-14 (×4): qty 2

## 2022-02-14 MED ORDER — ROCURONIUM BROMIDE 10 MG/ML (PF) SYRINGE
PREFILLED_SYRINGE | INTRAVENOUS | Status: AC
  Filled 2022-02-14: qty 10

## 2022-02-14 MED ORDER — INFLUENZA VAC A&B SA ADJ QUAD 0.5 ML IM PRSY
0.5000 mL | PREFILLED_SYRINGE | INTRAMUSCULAR | Status: AC
  Administered 2022-02-15: 0.5 mL via INTRAMUSCULAR
  Filled 2022-02-14 (×2): qty 0.5

## 2022-02-14 MED ORDER — SULFAMETHOXAZOLE-TRIMETHOPRIM 800-160 MG PO TABS: 1.0000 | ORAL_TABLET | Freq: Two times a day (BID) | ORAL | 0 refills | Status: DC

## 2022-02-14 MED ORDER — HEPARIN SODIUM (PORCINE) 1000 UNIT/ML IJ SOLN
INTRAMUSCULAR | Status: AC
  Filled 2022-02-14: qty 1

## 2022-02-14 MED ORDER — SODIUM CHLORIDE 0.9 % IR SOLN
Status: DC | PRN
  Administered 2022-02-14: 1000 mL via INTRAVESICAL

## 2022-02-14 MED ORDER — VASOPRESSIN 20 UNIT/ML IV SOLN
INTRAVENOUS | Status: AC
  Filled 2022-02-14: qty 1

## 2022-02-14 MED ORDER — MIDAZOLAM HCL 2 MG/2ML IJ SOLN: 0.5000 mg | Freq: Once | INTRAMUSCULAR | Status: DC | PRN

## 2022-02-14 MED ORDER — DOCUSATE SODIUM 100 MG PO CAPS
100.0000 mg | ORAL_CAPSULE | Freq: Two times a day (BID) | ORAL | Status: DC
  Administered 2022-02-14 – 2022-02-17 (×6): 100 mg via ORAL
  Filled 2022-02-14 (×6): qty 1

## 2022-02-14 MED ORDER — SUFENTANIL CITRATE 50 MCG/ML IV SOLN
INTRAVENOUS | Status: DC | PRN
  Administered 2022-02-14: 10 ug via INTRAVENOUS
  Administered 2022-02-14: 25 ug via INTRAVENOUS
  Administered 2022-02-14: 5 ug via INTRAVENOUS
  Administered 2022-02-14: 10 ug via INTRAVENOUS

## 2022-02-14 MED ORDER — SODIUM CHLORIDE (PF) 0.9 % IJ SOLN
INTRAMUSCULAR | Status: AC
  Filled 2022-02-14: qty 10

## 2022-02-14 MED ORDER — DOCUSATE SODIUM 100 MG PO CAPS: 100.0000 mg | ORAL_CAPSULE | Freq: Two times a day (BID) | ORAL | Status: DC

## 2022-02-14 MED ORDER — ONDANSETRON HCL 4 MG/2ML IJ SOLN
INTRAMUSCULAR | Status: DC | PRN
  Administered 2022-02-14: 4 mg via INTRAVENOUS

## 2022-02-14 MED ORDER — MIDAZOLAM HCL 2 MG/2ML IJ SOLN
INTRAMUSCULAR | Status: AC
  Filled 2022-02-14: qty 2

## 2022-02-14 MED ORDER — CHLORHEXIDINE GLUCONATE 0.12 % MT SOLN
15.0000 mL | Freq: Once | OROMUCOSAL | Status: AC
  Administered 2022-02-14: 15 mL via OROMUCOSAL

## 2022-02-14 MED ORDER — PROPOFOL 10 MG/ML IV BOLUS
INTRAVENOUS | Status: AC
  Filled 2022-02-14: qty 20

## 2022-02-14 MED ORDER — VASOPRESSIN 20 UNIT/ML IV SOLN
INTRAVENOUS | Status: DC | PRN
  Administered 2022-02-14: 1 [IU] via INTRAVENOUS

## 2022-02-14 MED ORDER — SODIUM CHLORIDE (PF) 0.9 % IJ SOLN
INTRAMUSCULAR | Status: AC
  Filled 2022-02-14: qty 20

## 2022-02-14 MED ORDER — TRAMADOL HCL 50 MG PO TABS: 50.0000 mg | ORAL_TABLET | Freq: Four times a day (QID) | ORAL | 0 refills | Status: DC | PRN

## 2022-02-14 MED ORDER — SUFENTANIL CITRATE 50 MCG/ML IV SOLN
INTRAVENOUS | Status: AC
  Filled 2022-02-14: qty 1

## 2022-02-14 MED ORDER — SUGAMMADEX SODIUM 500 MG/5ML IV SOLN
INTRAVENOUS | Status: DC | PRN
  Administered 2022-02-14: 250 mg via INTRAVENOUS

## 2022-02-14 MED ORDER — DIPHENHYDRAMINE HCL 12.5 MG/5ML PO ELIX: 12.5000 mg | ORAL_SOLUTION | Freq: Four times a day (QID) | ORAL | Status: DC | PRN

## 2022-02-14 MED ORDER — ALBUTEROL SULFATE HFA 108 (90 BASE) MCG/ACT IN AERS
INHALATION_SPRAY | RESPIRATORY_TRACT | Status: DC | PRN
  Administered 2022-02-14 (×2): 12 via RESPIRATORY_TRACT

## 2022-02-14 MED ORDER — PHENYLEPHRINE HCL (PRESSORS) 10 MG/ML IV SOLN
INTRAVENOUS | Status: AC
  Filled 2022-02-14: qty 1

## 2022-02-14 MED ORDER — SUGAMMADEX SODIUM 500 MG/5ML IV SOLN
INTRAVENOUS | Status: AC
  Filled 2022-02-14: qty 5

## 2022-02-14 MED ORDER — POLYETHYLENE GLYCOL 3350 17 G PO PACK: 17.0000 g | PACK | Freq: Every day | ORAL | Status: DC

## 2022-02-14 MED ORDER — ORAL CARE MOUTH RINSE: 15.0000 mL | Freq: Once | OROMUCOSAL | Status: AC

## 2022-02-14 MED ORDER — LIDOCAINE 2% (20 MG/ML) 5 ML SYRINGE
INTRAMUSCULAR | Status: DC | PRN
  Administered 2022-02-14: 40 mg via INTRAVENOUS

## 2022-02-14 MED ORDER — LACTATED RINGERS IV SOLN: INTRAVENOUS | Status: DC | PRN

## 2022-02-14 MED ORDER — DIPHENHYDRAMINE HCL 50 MG/ML IJ SOLN: 12.5000 mg | Freq: Four times a day (QID) | INTRAMUSCULAR | Status: DC | PRN

## 2022-02-14 MED ORDER — CEFAZOLIN SODIUM-DEXTROSE 2-4 GM/100ML-% IV SOLN
2.0000 g | Freq: Once | INTRAVENOUS | Status: AC
  Administered 2022-02-14: 2 g via INTRAVENOUS
  Filled 2022-02-14: qty 100

## 2022-02-14 MED ORDER — ALBUMIN HUMAN 5 % IV SOLN
INTRAVENOUS | Status: AC
  Filled 2022-02-14: qty 250

## 2022-02-14 MED ORDER — PROMETHAZINE HCL 25 MG/ML IJ SOLN
INTRAMUSCULAR | Status: AC
  Filled 2022-02-14: qty 1

## 2022-02-14 MED ORDER — PHENYLEPHRINE 80 MCG/ML (10ML) SYRINGE FOR IV PUSH (FOR BLOOD PRESSURE SUPPORT)
PREFILLED_SYRINGE | INTRAVENOUS | Status: DC | PRN
  Administered 2022-02-14 (×2): 160 ug via INTRAVENOUS
  Administered 2022-02-14 (×2): 80 ug via INTRAVENOUS

## 2022-02-14 MED ORDER — LACTATED RINGERS IV SOLN
INTRAVENOUS | Status: DC | PRN
  Administered 2022-02-14: 1000 mL

## 2022-02-14 MED ORDER — PROMETHAZINE HCL 25 MG/ML IJ SOLN
6.2500 mg | INTRAMUSCULAR | Status: DC | PRN
  Administered 2022-02-14: 12.5 mg via INTRAVENOUS

## 2022-02-14 MED ORDER — SUCCINYLCHOLINE CHLORIDE 200 MG/10ML IV SOSY
PREFILLED_SYRINGE | INTRAVENOUS | Status: AC
  Filled 2022-02-14: qty 10

## 2022-02-14 MED ORDER — LIDOCAINE HCL (PF) 2 % IJ SOLN
INTRAMUSCULAR | Status: AC
  Filled 2022-02-14: qty 5

## 2022-02-14 MED ORDER — ONDANSETRON HCL 4 MG/2ML IJ SOLN
INTRAMUSCULAR | Status: AC
  Filled 2022-02-14: qty 2

## 2022-02-14 MED ORDER — PROPOFOL 10 MG/ML IV BOLUS
INTRAVENOUS | Status: DC | PRN
  Administered 2022-02-14: 100 mg via INTRAVENOUS

## 2022-02-14 MED ORDER — ONDANSETRON HCL 4 MG/2ML IJ SOLN
4.0000 mg | INTRAMUSCULAR | Status: DC | PRN
  Administered 2022-02-14 – 2022-02-16 (×6): 4 mg via INTRAVENOUS
  Filled 2022-02-14 (×6): qty 2

## 2022-02-14 MED ORDER — STERILE WATER FOR IRRIGATION IR SOLN
Status: DC | PRN
  Administered 2022-02-14: 1000 mL

## 2022-02-14 MED ORDER — OXYCODONE HCL 5 MG/5ML PO SOLN: 5.0000 mg | Freq: Once | ORAL | Status: DC | PRN

## 2022-02-14 MED ORDER — SODIUM CHLORIDE 0.9 % IV BOLUS
1000.0000 mL | Freq: Once | INTRAVENOUS | Status: AC
  Administered 2022-02-14: 1000 mL via INTRAVENOUS

## 2022-02-14 MED ORDER — ACETAMINOPHEN 325 MG PO TABS
650.0000 mg | ORAL_TABLET | ORAL | Status: DC | PRN
  Administered 2022-02-16 – 2022-02-17 (×3): 650 mg via ORAL
  Filled 2022-02-14 (×3): qty 2

## 2022-02-14 MED ORDER — OXYCODONE HCL 5 MG PO TABS: 5.0000 mg | ORAL_TABLET | Freq: Once | ORAL | Status: DC | PRN

## 2022-02-14 MED ORDER — ZOLPIDEM TARTRATE 5 MG PO TABS
5.0000 mg | ORAL_TABLET | Freq: Every evening | ORAL | Status: DC | PRN
  Administered 2022-02-16: 5 mg via ORAL
  Filled 2022-02-14: qty 1

## 2022-02-14 MED ORDER — ROCURONIUM BROMIDE 10 MG/ML (PF) SYRINGE
PREFILLED_SYRINGE | INTRAVENOUS | Status: DC | PRN
  Administered 2022-02-14: 30 mg via INTRAVENOUS
  Administered 2022-02-14: 20 mg via INTRAVENOUS
  Administered 2022-02-14: 70 mg via INTRAVENOUS
  Administered 2022-02-14: 10 mg via INTRAVENOUS

## 2022-02-14 MED ORDER — BUPIVACAINE-EPINEPHRINE 0.25% -1:200000 IJ SOLN
INTRAMUSCULAR | Status: DC | PRN
  Administered 2022-02-14: 30 mL

## 2022-02-14 MED ORDER — MIDAZOLAM HCL 2 MG/2ML IJ SOLN
INTRAMUSCULAR | Status: DC | PRN
  Administered 2022-02-14: 2 mg via INTRAVENOUS

## 2022-02-14 MED ORDER — ALBUTEROL SULFATE HFA 108 (90 BASE) MCG/ACT IN AERS
INHALATION_SPRAY | RESPIRATORY_TRACT | Status: AC
  Filled 2022-02-14: qty 6.7

## 2022-02-14 MED ORDER — LACTATED RINGERS IV SOLN: INTRAVENOUS | Status: DC

## 2022-02-14 MED ORDER — AMLODIPINE BESYLATE 10 MG PO TABS
10.0000 mg | ORAL_TABLET | Freq: Every day | ORAL | Status: DC
  Administered 2022-02-14 – 2022-02-17 (×4): 10 mg via ORAL
  Filled 2022-02-14 (×4): qty 1

## 2022-02-14 MED ORDER — FLEET ENEMA 7-19 GM/118ML RE ENEM: 1.0000 | ENEMA | Freq: Once | RECTAL | Status: DC

## 2022-02-14 MED ORDER — BUPIVACAINE-EPINEPHRINE (PF) 0.25% -1:200000 IJ SOLN
INTRAMUSCULAR | Status: AC
  Filled 2022-02-14: qty 30

## 2022-02-14 MED ORDER — ACETAMINOPHEN 500 MG PO TABS
1000.0000 mg | ORAL_TABLET | Freq: Once | ORAL | Status: AC
  Administered 2022-02-14: 1000 mg via ORAL
  Filled 2022-02-14: qty 2

## 2022-02-14 MED ORDER — CEFAZOLIN SODIUM-DEXTROSE 1-4 GM/50ML-% IV SOLN
1.0000 g | Freq: Three times a day (TID) | INTRAVENOUS | Status: AC
  Administered 2022-02-14 (×2): 1 g via INTRAVENOUS
  Filled 2022-02-14 (×2): qty 50

## 2022-02-14 MED ORDER — POTASSIUM CHLORIDE IN NACL 20-0.45 MEQ/L-% IV SOLN
INTRAVENOUS | Status: DC
  Filled 2022-02-14 (×3): qty 1000

## 2022-02-14 MED ORDER — ALBUMIN HUMAN 5 % IV SOLN: INTRAVENOUS | Status: DC | PRN

## 2022-02-14 SURGICAL SUPPLY — 67 items
ADH SKN CLS APL DERMABOND .7 (GAUZE/BANDAGES/DRESSINGS) ×2
APL PRP STRL LF DISP 70% ISPRP (MISCELLANEOUS) ×2
APL SWBSTK 6 STRL LF DISP (MISCELLANEOUS) ×2
APPLICATOR COTTON TIP 6 STRL (MISCELLANEOUS) ×3 IMPLANT
APPLICATOR COTTON TIP 6IN STRL (MISCELLANEOUS) ×2
BAG COUNTER SPONGE SURGICOUNT (BAG) IMPLANT
BAG SPNG CNTER NS LX DISP (BAG)
CATH FOLEY 2WAY SLVR 18FR 30CC (CATHETERS) ×3 IMPLANT
CATH ROBINSON RED A/P 16FR (CATHETERS) ×3 IMPLANT
CATH ROBINSON RED A/P 8FR (CATHETERS) ×3 IMPLANT
CATH TIEMANN FOLEY 18FR 5CC (CATHETERS) ×3 IMPLANT
CHLORAPREP W/TINT 26 (MISCELLANEOUS) ×3 IMPLANT
CLIP LIGATING HEM O LOK PURPLE (MISCELLANEOUS) ×6 IMPLANT
COVER SURGICAL LIGHT HANDLE (MISCELLANEOUS) ×3 IMPLANT
COVER TIP SHEARS 8 DVNC (MISCELLANEOUS) ×3 IMPLANT
COVER TIP SHEARS 8MM DA VINCI (MISCELLANEOUS) ×2
CUTTER ECHEON FLEX ENDO 45 340 (ENDOMECHANICALS) ×3 IMPLANT
DERMABOND ADVANCED .7 DNX12 (GAUZE/BANDAGES/DRESSINGS) ×3 IMPLANT
DRAIN CHANNEL RND F F (WOUND CARE) IMPLANT
DRAPE ARM DVNC X/XI (DISPOSABLE) ×12 IMPLANT
DRAPE COLUMN DVNC XI (DISPOSABLE) ×3 IMPLANT
DRAPE DA VINCI XI ARM (DISPOSABLE) ×8
DRAPE DA VINCI XI COLUMN (DISPOSABLE) ×2
DRAPE SURG IRRIG POUCH 19X23 (DRAPES) ×3 IMPLANT
DRSG TEGADERM 4X4.75 (GAUZE/BANDAGES/DRESSINGS) ×3 IMPLANT
ELECT PENCIL ROCKER SW 15FT (MISCELLANEOUS) ×3 IMPLANT
ELECT REM PT RETURN 15FT ADLT (MISCELLANEOUS) ×3 IMPLANT
GAUZE 4X4 16PLY ~~LOC~~+RFID DBL (SPONGE) ×3 IMPLANT
GAUZE SPONGE 4X4 12PLY STRL (GAUZE/BANDAGES/DRESSINGS) ×3 IMPLANT
GLOVE BIO SURGEON STRL SZ 6.5 (GLOVE) ×3 IMPLANT
GLOVE SURG LX STRL 7.5 STRW (GLOVE) ×6 IMPLANT
GOWN SRG XL LVL 4 BRTHBL STRL (GOWNS) ×3 IMPLANT
GOWN STRL NON-REIN XL LVL4 (GOWNS) ×2
GOWN STRL REUS W/ TWL XL LVL3 (GOWN DISPOSABLE) ×6 IMPLANT
GOWN STRL REUS W/TWL XL LVL3 (GOWN DISPOSABLE) ×4
HEMOSTAT SURGICEL 2X3 (HEMOSTASIS) IMPLANT
HEMOSTAT SURGICEL 4X8 (HEMOSTASIS) IMPLANT
HOLDER FOLEY CATH W/STRAP (MISCELLANEOUS) ×3 IMPLANT
IRRIG SUCT STRYKERFLOW 2 WTIP (MISCELLANEOUS) ×2
IRRIGATION SUCT STRKRFLW 2 WTP (MISCELLANEOUS) ×3 IMPLANT
IV LACTATED RINGERS 1000ML (IV SOLUTION) ×3 IMPLANT
KIT TURNOVER KIT A (KITS) IMPLANT
NDL SAFETY ECLIP 18X1.5 (MISCELLANEOUS) ×3 IMPLANT
PACK ROBOT UROLOGY CUSTOM (CUSTOM PROCEDURE TRAY) ×3 IMPLANT
RELOAD STAPLE 45 4.1 GRN THCK (STAPLE) ×3 IMPLANT
SEAL CANN UNIV 5-8 DVNC XI (MISCELLANEOUS) ×12 IMPLANT
SEAL XI 5MM-8MM UNIVERSAL (MISCELLANEOUS) ×8
SET TUBE SMOKE EVAC HIGH FLOW (TUBING) ×3 IMPLANT
SOLUTION ELECTROLUBE (MISCELLANEOUS) ×3 IMPLANT
SPIKE FLUID TRANSFER (MISCELLANEOUS) ×3 IMPLANT
STAPLE RELOAD 45 GRN (STAPLE) ×2 IMPLANT
STAPLE RELOAD 45MM GREEN (STAPLE) ×2
SUT ETHILON 3 0 PS 1 (SUTURE) ×3 IMPLANT
SUT MNCRL 3 0 RB1 (SUTURE) ×3 IMPLANT
SUT MNCRL 3 0 VIOLET RB1 (SUTURE) ×3 IMPLANT
SUT MNCRL AB 4-0 PS2 18 (SUTURE) ×6 IMPLANT
SUT MONOCRYL 3 0 RB1 (SUTURE) ×4
SUT PDS PLUS 0 (SUTURE) ×4
SUT PDS PLUS AB 0 CT-2 (SUTURE) ×6 IMPLANT
SUT VIC AB 0 CT1 27 (SUTURE) ×4
SUT VIC AB 0 CT1 27XBRD ANTBC (SUTURE) ×6 IMPLANT
SUT VIC AB 2-0 SH 27 (SUTURE) ×2
SUT VIC AB 2-0 SH 27X BRD (SUTURE) ×3 IMPLANT
SYR 27GX1/2 1ML LL SAFETY (SYRINGE) ×3 IMPLANT
TOWEL OR NON WOVEN STRL DISP B (DISPOSABLE) ×3 IMPLANT
TROCAR Z-THREAD FIOS 5X100MM (TROCAR) IMPLANT
WATER STERILE IRR 1000ML POUR (IV SOLUTION) ×3 IMPLANT

## 2022-02-14 NOTE — Discharge Instructions (Signed)

## 2022-02-14 NOTE — Op Note (Signed)
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 M.D.  Assistant(s): Debbrah Alar, PA-C  An assistant was required for this surgical procedure.  The duties of the assistant included but were not limited to suctioning, passing suture, camera manipulation, retraction. This procedure would not be able to be performed without an Environmental consultant.  Anesthesia: General  Complications: None  EBL: 350 mL  IVF:  2000 mL crystalloid, 500 cc colloid  Specimens: Prostate and seminal vesicles Right pelvic lymph nodes Left pelvic lymph nodes  Disposition of specimens: Pathology  Drains: 20 Fr coude catheter # 19 Blake pelvic drain  Indication: Jacob [name] is a 66 y.o. year old patient with clinically localized prostate cancer.  After a thorough review of the management options for treatment of prostate cancer, he elected to proceed with surgical therapy and the above procedure(s).  We have discussed the potential benefits and risks of the procedure, side effects of the proposed treatment, the likelihood of the patient achieving the goals of the procedure, and any potential problems that might occur during the procedure or recuperation. Informed consent has been obtained.  Description of procedure:  The patient was taken to the operating room and a general anesthetic was administered. He was given preoperative antibiotics, placed in the dorsal lithotomy position, and prepped and draped in the usual sterile fashion. Next a preoperative timeout was performed. A urethral catheter was placed into the bladder and a site was selected near the umbilicus for placement of  the camera port. This was placed using a standard open Hassan technique which allowed entry into the peritoneal cavity under direct vision and without difficulty. A 12 mm port was placed and a pneumoperitoneum established. The camera was then used to inspect the abdomen.  He was noted to have some omental and small bowel adhesions to the anterior abdominal wall related to his prior umbilical hernia repair.  The remaining abdominal ports were then placed. 8 mm robotic ports were placed in the right lower quadrant, left lower quadrant, and far left lateral abdominal wall. A 5 mm port was placed in the right upper quadrant and a 12 mm port was placed in the right lateral abdominal wall for laparoscopic assistance. All ports were placed under direct vision without difficulty.  Utilizing laparoscopic scissors, the aforementioned adhesions from the abdominal wall were carefully taken down sharply.  The surgical cart was then docked.   Utilizing the cautery scissors, the bladder was reflected posteriorly allowing entry into the space of Retzius and identification of the endopelvic fascia and prostate. The periprostatic fat was then removed from the prostate allowing full exposure of the endopelvic fascia. The endopelvic fascia was then incised from the apex back to the base of the prostate bilaterally and the underlying levator muscle fibers were swept laterally off the prostate thereby isolating the dorsal venous complex. The dorsal vein was then stapled and divided with a 45 mm Flex Echelon stapler. Attention then turned to the bladder neck which was divided anteriorly thereby allowing entry into the bladder and exposure of the urethral catheter. The catheter balloon was deflated and the catheter was brought into the operative field and used to retract the prostate anteriorly. The posterior bladder neck was then examined and was divided allowing further  dissection between the bladder and prostate posteriorly until the  vasa deferentia and seminal vessels were identified. The vasa deferentia were isolated, divided, and lifted anteriorly. The seminal vesicles were dissected down to their tips with care to control the seminal vascular arterial blood supply. These structures were then lifted anteriorly and the space between Denonvillier's fascia and the anterior rectum was developed with a combination of sharp and blunt dissection. This isolated the vascular pedicles of the prostate.  A wide non nerve sparing dissection was performed with Weck clips used to ligate the vascular pedicles of the prostate bilaterally. The vascular pedicles of the prostate were then divided.  The urethra was then sharply transected allowing the prostate specimen to be disarticulated. The pelvis was copiously irrigated and hemostasis was ensured. There was no evidence for rectal injury.  Attention then turned to the right pelvic sidewall. The fibrofatty tissue between the external iliac vein, confluence of the iliac vessels, hypogastric artery, and Cooper's ligament was dissected free from the pelvic sidewall with care to preserve the obturator nerve. Weck clips were used for lymphostasis and hemostasis. An identical procedure was performed on the contralateral side and the lymphatic packets were removed for permanent pathologic analysis.  Attention then turned to the urethral anastomosis. A 2-0 Vicryl slip knot was placed between Denonvillier's fascia, the posterior bladder neck, and the posterior urethra to reapproximate these structures.  At this point, the patient was noted to be hypotensive and hypoxic.  We undocked the surgical cart and flatten the patient.  He was administered albuterol, additional IV fluids, and his endotracheal tube was reevaluated.  It was felt that he may not be properly ventilating due to his obesity, steep Trendelenburg position, and the pneumoperitoneum.  Once he was stabilized, he was placed back into Trendelenburg  but not in steep as before.  His pneumoperitoneum was left at 10 mmHg pressure and the surgical cart was again docked.  A double-armed 3-0 Monocryl suture was then used to perform a 360 running tension-free anastomosis between the bladder neck and urethra. A new urethral catheter was then placed into the bladder and irrigated. There were no blood clots within the bladder and the anastomosis appeared to be watertight. A #19 Blake drain was then brought through the left lateral 8 mm port site and positioned appropriately within the pelvis. It was secured to the skin with a nylon suture. The surgical cart was then undocked. The right lateral 12 mm port site was closed at the fascial level with a 0 Vicryl suture placed laparoscopically. All remaining ports were then removed under direct vision. The prostate specimen was removed intact within the Endopouch retrieval bag via the periumbilical camera port site. This fascial opening was closed with two running 0 Vicryl sutures. 0.25% Marcaine was then injected into all port sites and all incisions were reapproximated at the skin level with 3-0 Monocryl subcuticular sutures. Dermabond was applied to the skin. The patient appeared to tolerate the procedure well and without complications. The patient was able to be extubated and transferred to the recovery unit in satisfactory condition.  Pryor Curia MD

## 2022-02-14 NOTE — Anesthesia Postprocedure Evaluation (Signed)
Anesthesia Post Note  Patient: Jacob [name]  Procedure(s) Performed: XI ROBOTIC ASSISTED LAPAROSCOPIC RADICAL PROSTATECTOMY LEVEL 2 (Abdomen) LYMPHADENECTOMY, PELVIC (Bilateral: Abdomen)     Patient location during evaluation: PACU Anesthesia Type: General Level of consciousness: awake and alert, patient cooperative and oriented Pain management: pain level controlled Vital Signs Assessment: post-procedure vital signs reviewed and stable Respiratory status: spontaneous breathing, nonlabored ventilation, respiratory function stable and patient connected to nasal cannula oxygen Cardiovascular status: blood pressure returned to baseline and stable Postop Assessment: no apparent nausea or vomiting Anesthetic complications: no   No notable events documented.  Last Vitals:  Vitals:   02/14/22 1300 02/14/22 1330  BP: 123/83 103/71  Pulse: (!) 104 98  Resp: 13 12  Temp:  36.4 C  SpO2: 97% 96%    Last Pain:  Vitals:   02/14/22 1300  TempSrc:   PainSc: Asleep                 Reeve Mallo,E. Bruno Leach

## 2022-02-14 NOTE — Progress Notes (Signed)
Attempted to ambulate patient. While sitting on edge of bed, patient became diaphoretic, lightheaded, BP 81/55, HR 79. Pt assisted back to lying in bed and BP quickly increased to 105/65, HR 80. O2 Sats 85% RA while lying in bed- 2L applied and increased to 95%. 300 ml emptied from JP drain since 1300. NP [name] paged to be made aware of all above. Will continue to monitor and attempt to ambulate later this evening.

## 2022-02-14 NOTE — Transfer of Care (Signed)
Immediate Anesthesia Transfer of Care Note  Patient: Jacob [name]  Procedure(s) Performed: XI ROBOTIC ASSISTED LAPAROSCOPIC RADICAL PROSTATECTOMY LEVEL 2 (Abdomen) LYMPHADENECTOMY, PELVIC (Bilateral: Abdomen)  Patient Location: PACU  Anesthesia Type:General  Level of Consciousness: drowsy  Airway & Oxygen Therapy: Patient Spontanous Breathing and Patient connected to face mask oxygen  Post-op Assessment: Report given to RN and Post -op Vital signs reviewed and stable  Post vital signs: Reviewed and stable  Last Vitals:  Vitals Value Taken Time  BP 127/78 02/14/22 1110  Temp    Pulse 96 02/14/22 1112  Resp    SpO2 100 % 02/14/22 1112  Vitals shown include unvalidated device data.  Last Pain:  Vitals:   02/14/22 0553  TempSrc: Oral         Complications: No notable events documented.

## 2022-02-14 NOTE — Progress Notes (Signed)
Patient ID: Jacob [name], male   DOB: Jul 25, 1955, 66 y.o.   MRN: [id number]  Post-op note  Subjective: The patient is doing well.  Complains of abdominal pain.  Objective: Vital signs in last 24 hours: Temp:  [97.6 F (36.4 C)-98.4 F (36.9 C)] 97.8 F (36.6 C) (10/16 1404) Pulse Rate:  [79-106] 79 (10/16 1520) Resp:  [12-18] 18 (10/16 1404) BP: (103-136)/(64-94) 113/64 (10/16 1520) SpO2:  [95 %-100 %] 96 % (10/16 1404) Weight:  [105.9 kg] 105.9 kg (10/16 0525)  Intake/Output from previous day: No intake/output data recorded. Intake/Output this shift: Total I/O In: 3440 [P.O.:240; I.V.:2600; IV Piggyback:600] Out: 990 [Urine:175; Drains:465; Blood:350]  Physical Exam:  General: Alert and oriented. Abdomen: Soft, Nondistended. Incisions: Clean and dry. GU: Urine red but clearing and draining well.  Lab Results: Recent Labs    02/14/22 1156  HGB 9.7*  HCT 30.2*    Assessment/Plan: POD#0   1) Continue to monitor, ambulate, IS   Jacob [name]. MD   LOS: 0 days   Jacob [name] 02/14/2022, 4:14 PM

## 2022-02-14 NOTE — Interval H&P Note (Signed)
History and Physical Interval Note:  02/14/2022 6:44 AM  Jacob [name]  has presented today for surgery, with the diagnosis of PROSTATE CANCER.  The various methods of treatment have been discussed with the patient and family. After consideration of risks, benefits and other options for treatment, the patient has consented to  Procedure(s): XI ROBOTIC ASSISTED LAPAROSCOPIC RADICAL PROSTATECTOMY LEVEL 2 (N/A) LYMPHADENECTOMY, PELVIC (Bilateral) as a surgical intervention.  The patient's history has been reviewed, patient examined, no change in status, stable for surgery.  I have reviewed the patient's chart and labs.  Questions were answered to the patient's satisfaction.     [employer]

## 2022-02-14 NOTE — Anesthesia Procedure Notes (Signed)
Procedure Name: Intubation Date/Time: 02/14/2022 7:23 AM  Performed by: Sharlette Dense, CRNAPre-anesthesia Checklist: Patient identified, Emergency Drugs available, Suction available and Patient being monitored Patient Re-evaluated:Patient Re-evaluated prior to induction Oxygen Delivery Method: Circle system utilized Preoxygenation: Pre-oxygenation with 100% oxygen Induction Type: IV induction Ventilation: Mask ventilation without difficulty and Oral airway inserted - appropriate to patient size Laryngoscope Size: Miller and 3 Grade View: Grade I Tube type: Oral Tube size: 8.0 mm Number of attempts: 1 Airway Equipment and Method: Stylet Placement Confirmation: ETT inserted through vocal cords under direct vision, positive ETCO2 and breath sounds checked- equal and bilateral Secured at: 22 cm Tube secured with: Tape Dental Injury: Teeth and Oropharynx as per pre-operative assessment

## 2022-02-15 ENCOUNTER — Encounter (HOSPITAL_COMMUNITY): Payer: Self-pay | Admitting: Urology

## 2022-02-15 DIAGNOSIS — M109 Gout, unspecified: Secondary | ICD-10-CM | POA: Diagnosis present

## 2022-02-15 DIAGNOSIS — N529 Male erectile dysfunction, unspecified: Secondary | ICD-10-CM | POA: Diagnosis present

## 2022-02-15 DIAGNOSIS — Z602 Problems related to living alone: Secondary | ICD-10-CM | POA: Diagnosis present

## 2022-02-15 DIAGNOSIS — R338 Other retention of urine: Secondary | ICD-10-CM | POA: Diagnosis present

## 2022-02-15 DIAGNOSIS — F32A Depression, unspecified: Secondary | ICD-10-CM | POA: Diagnosis present

## 2022-02-15 DIAGNOSIS — Z7984 Long term (current) use of oral hypoglycemic drugs: Secondary | ICD-10-CM | POA: Diagnosis not present

## 2022-02-15 DIAGNOSIS — N401 Enlarged prostate with lower urinary tract symptoms: Secondary | ICD-10-CM | POA: Diagnosis present

## 2022-02-15 DIAGNOSIS — M6281 Muscle weakness (generalized): Secondary | ICD-10-CM | POA: Diagnosis not present

## 2022-02-15 DIAGNOSIS — I959 Hypotension, unspecified: Secondary | ICD-10-CM | POA: Diagnosis not present

## 2022-02-15 DIAGNOSIS — C61 Malignant neoplasm of prostate: Secondary | ICD-10-CM | POA: Diagnosis present

## 2022-02-15 DIAGNOSIS — Z23 Encounter for immunization: Secondary | ICD-10-CM | POA: Diagnosis not present

## 2022-02-15 DIAGNOSIS — Z888 Allergy status to other drugs, medicaments and biological substances status: Secondary | ICD-10-CM | POA: Diagnosis not present

## 2022-02-15 DIAGNOSIS — E785 Hyperlipidemia, unspecified: Secondary | ICD-10-CM | POA: Diagnosis present

## 2022-02-15 DIAGNOSIS — E119 Type 2 diabetes mellitus without complications: Secondary | ICD-10-CM | POA: Diagnosis present

## 2022-02-15 DIAGNOSIS — R110 Nausea: Secondary | ICD-10-CM | POA: Diagnosis not present

## 2022-02-15 DIAGNOSIS — Z885 Allergy status to narcotic agent status: Secondary | ICD-10-CM | POA: Diagnosis not present

## 2022-02-15 DIAGNOSIS — E669 Obesity, unspecified: Secondary | ICD-10-CM | POA: Diagnosis present

## 2022-02-15 DIAGNOSIS — Z6834 Body mass index (BMI) 34.0-34.9, adult: Secondary | ICD-10-CM | POA: Diagnosis not present

## 2022-02-15 DIAGNOSIS — I10 Essential (primary) hypertension: Secondary | ICD-10-CM | POA: Diagnosis present

## 2022-02-15 DIAGNOSIS — K219 Gastro-esophageal reflux disease without esophagitis: Secondary | ICD-10-CM | POA: Diagnosis present

## 2022-02-15 DIAGNOSIS — Z79899 Other long term (current) drug therapy: Secondary | ICD-10-CM | POA: Diagnosis not present

## 2022-02-15 DIAGNOSIS — Z7982 Long term (current) use of aspirin: Secondary | ICD-10-CM | POA: Diagnosis not present

## 2022-02-15 LAB — GLUCOSE, CAPILLARY
Glucose-Capillary: 118 mg/dL — ABNORMAL HIGH (ref 70–99)
Glucose-Capillary: 133 mg/dL — ABNORMAL HIGH (ref 70–99)
Glucose-Capillary: 139 mg/dL — ABNORMAL HIGH (ref 70–99)
Glucose-Capillary: 154 mg/dL — ABNORMAL HIGH (ref 70–99)
Glucose-Capillary: 181 mg/dL — ABNORMAL HIGH (ref 70–99)
Glucose-Capillary: 186 mg/dL — ABNORMAL HIGH (ref 70–99)
Glucose-Capillary: 202 mg/dL — ABNORMAL HIGH (ref 70–99)

## 2022-02-15 LAB — HEMOGLOBIN AND HEMATOCRIT, BLOOD
HCT: 25.6 % — ABNORMAL LOW (ref 39.0–52.0)
Hemoglobin: 8.6 g/dL — ABNORMAL LOW (ref 13.0–17.0)

## 2022-02-15 LAB — CREATININE, FLUID (PLEURAL, PERITONEAL, JP DRAINAGE): Creat, Fluid: 3.8 mg/dL

## 2022-02-15 MED ORDER — BISACODYL 10 MG RE SUPP
10.0000 mg | Freq: Once | RECTAL | Status: AC
  Administered 2022-02-15: 10 mg via RECTAL
  Filled 2022-02-15: qty 1

## 2022-02-15 MED ORDER — SODIUM CHLORIDE 0.9 % IV SOLN: INTRAVENOUS | Status: DC

## 2022-02-15 MED ORDER — TRAMADOL HCL 50 MG PO TABS
50.0000 mg | ORAL_TABLET | Freq: Four times a day (QID) | ORAL | Status: DC | PRN
  Administered 2022-02-15: 50 mg via ORAL
  Administered 2022-02-15 (×2): 100 mg via ORAL
  Filled 2022-02-15 (×2): qty 2
  Filled 2022-02-15: qty 1

## 2022-02-15 NOTE — Progress Notes (Signed)
Mobility Specialist Cancellation/Refusal Note:   Pt declined mobility at this time. Pt stated he just walked & feeling sore today. Will check back as schedule permits.       [facility]

## 2022-02-15 NOTE — Progress Notes (Signed)
Patient ID: Jacob [name], male   DOB: 03/30/1956, 66 y.o.   MRN: [id number]  Patient ambulated better today.  Still with nausea and poor po intake.  Drain Cr slightly elevated at 3.8 but output down significantly.  UOP somewhat poor.  Plan:  Will restart IVF hydration tonight and monitor drain/catheter output with plans to recheck drain Cr in AM.  Have instructed patient to try Tylenol as opposed to narcotic pain meds due to nausea.  Will advance diet as he can tolerate.

## 2022-02-15 NOTE — Progress Notes (Signed)
Patient ID: Jacob [name], male   DOB: 11-Apr-1956, 66 y.o.   MRN: [id number]  1 Day Post-Op Subjective: The patient is doing well.  Some hypotension when attempting to ambulate early evening yesterday.  Improved ability later in the evening.  Hgb was rechecked as was stable. Pain is adequately controlled.  Objective: Vital signs in last 24 hours: Temp:  [97.6 F (36.4 C)-99.3 F (37.4 C)] 99 F (37.2 C) (10/17 0426) Pulse Rate:  [79-106] 95 (10/17 0426) Resp:  [12-22] 18 (10/17 0426) BP: (81-136)/(55-83) 113/72 (10/17 0437) SpO2:  [85 %-98 %] 94 % (10/17 0426)  Intake/Output from previous day: 10/16 0701 - 10/17 0700 In: 5311.5 [P.O.:360; I.V.:4351.5; IV Piggyback:600] Out: 1935 [Urine:875; Drains:710; Blood:350] Intake/Output this shift: No intake/output data recorded.  Physical Exam:  General: Alert and oriented. CV: RRR Lungs: Clear bilaterally. GI: Soft, Nondistended. Incisions: Clean, dry, and intact Urine: Clear Extremities: Nontender, no erythema, no edema.  Lab Results: Recent Labs    02/14/22 1156 02/14/22 1834  HGB 9.7* 9.4*  HCT 30.2* 28.8*      Assessment/Plan: POD# 1 s/p robotic prostatectomy.  1) SL IVF 2) Ambulate, Incentive spirometry 3) Transition to oral pain medication 4) Dulcolax suppository 5) D/C pelvic drain after checking drain Cr as a precaution 6) Plan for likely discharge later today   Pryor Curia. MD   LOS: 0 days   [name] 02/15/2022, 7:21 AM

## 2022-02-16 LAB — GLUCOSE, CAPILLARY
Glucose-Capillary: 138 mg/dL — ABNORMAL HIGH (ref 70–99)
Glucose-Capillary: 129 mg/dL — ABNORMAL HIGH (ref 70–99)
Glucose-Capillary: 178 mg/dL — ABNORMAL HIGH (ref 70–99)
Glucose-Capillary: 157 mg/dL — ABNORMAL HIGH (ref 70–99)
Glucose-Capillary: 148 mg/dL — ABNORMAL HIGH (ref 70–99)
Glucose-Capillary: 198 mg/dL — ABNORMAL HIGH (ref 70–99)

## 2022-02-16 LAB — CREATININE, FLUID (PLEURAL, PERITONEAL, JP DRAINAGE): Creat, Fluid: 1.8 mg/dL

## 2022-02-16 MED ORDER — KETOROLAC TROMETHAMINE 15 MG/ML IJ SOLN
15.0000 mg | Freq: Four times a day (QID) | INTRAMUSCULAR | Status: AC
  Administered 2022-02-16 – 2022-02-17 (×5): 15 mg via INTRAVENOUS
  Filled 2022-02-16 (×5): qty 1

## 2022-02-16 MED ORDER — BENZONATATE 100 MG PO CAPS: 200.0000 mg | ORAL_CAPSULE | Freq: Three times a day (TID) | ORAL | Status: DC | PRN

## 2022-02-16 NOTE — Progress Notes (Signed)
Patient ID: Jacob [name], male   DOB: Feb 15, 1956, 66 y.o.   MRN: [id number]  2 Days Post-Op Subjective: Pt still with nausea.  He did take tramadol last night.  Has not eaten.  Having flatus and bowel movements.  Currently still on 2 L [place].  Objective: Vital signs in last 24 hours: Temp:  [98.2 F (36.8 C)-99.1 F (37.3 C)] 99.1 F (37.3 C) (10/18 0403) Pulse Rate:  [86-94] 94 (10/18 0403) Resp:  [18-20] 20 (10/18 0403) BP: (99-113)/(69-72) 110/72 (10/18 0403) SpO2:  [85 %-97 %] 85 % (10/18 0403)  Intake/Output from previous day: 10/17 0701 - 10/18 0700 In: 1609.9 [P.O.:360; I.V.:1249.9] Out: 1065 [Urine:925; Drains:140] Intake/Output this shift: Total I/O In: 1314.1 [P.O.:120; I.V.:1194.1] Out: 53 [Urine:700; Drains:60]  Physical Exam:  General: Alert and oriented CV: RRR Lungs: Clear Abdomen: Soft, ND, positive BS Incisions: C/D/I GU: Urine clear Ext: NT, No erythema  Lab Results: Recent Labs    02/14/22 1156 02/14/22 1834 02/15/22 0938  HGB 9.7* 9.4* 8.6*  HCT 30.2* 28.8* 25.6*   BMET Recent Labs    02/14/22 1156  NA 136  K 3.8  CL 107  CO2 25  GLUCOSE 219*  BUN 16  CREATININE 1.16  CALCIUM 7.2*     Studies/Results: No results found.  Assessment/Plan: POD # 2 s/p RALP/BPLND - Recheck drain Cr and remove drain if appropriate - SL IVF and begin regular (carb modified) diet - Ambulate and will have PT evaluate to assessa ability to be safe at home - Stop narcotics to see if this is the cause of his nausea - Wean oxygen off (room air sats at baseline were 91% on admission before surgery)   LOS: 1 day   [name] 02/16/2022, 6:53 AM

## 2022-02-16 NOTE — Evaluation (Addendum)
Physical Therapy Evaluation Patient Details Name: ENNIS HEAVNER MRN: [id number] DOB: July 15, 1955 Today's Date: 02/16/2022  History of Present Illness  82 yp maled admitted with diagnosis of prostate cancer, S/P Robotic assisted laparoscopic radical prostatectomy (non nerve sparing) and Bilateral robotic assisted laparoscopic pelvic lymphadenectomy   on 01/1622. XIP:JASNKNL, depression, HTN, DM2, OSA,syncope collapse, gout hernia rpair.  Clinical Impression  Patient  admitted for above medical problems. Patient  resides alone, limited support.  Patient Independent PTA.  Patient  ambulated x 180', stated did ambulate  around unit  yesterday.  Patient reports feeling dizzy when sitting and during ambulation, feelin nausea, has had medication.  Pre BP 132/67, post 144/95 SPO2 955 on RA after ambulation, HR104.  Patient should progress to return home with support and HHPT. Pt admitted with above diagnosis.  Pt currently with functional limitations due to the deficits listed below (see PT Problem List). Pt will benefit from skilled PT to increase their independence and safety with mobility to allow discharge to the venue listed below.        Recommendations for follow up therapy are one component of a multi-disciplinary discharge planning process, led by the attending physician.  Recommendations may be updated based on patient status, additional functional criteria and insurance authorization.  Follow Up Recommendations Home health PT, vs SNF      Assistance Recommended at Discharge Intermittent Supervision/Assistance  Patient can return home with the following  A little help with bathing/dressing/bathroom;Help with stairs or ramp for entrance;Assistance with cooking/housework;Assist for transportation    Equipment Recommendations Rolling walker (2 wheels)  Recommendations for Other Services  OT consult    Functional Status Assessment Patient has had a recent decline in their functional  status and demonstrates the ability to make significant improvements in function in a reasonable and predictable amount of time.     Precautions / Restrictions Precautions Precautions: Fall Precaution Comments: has had low BP, right drain, H/O fall Restrictions Weight Bearing Restrictions: No      Mobility  Bed Mobility Overal bed mobility: Needs Assistance Bed Mobility: Supine to Sit     Supine to sit: Mod assist     General bed mobility comments: extra time, hand assist to move to sit up    Transfers Overall transfer level: Needs assistance Equipment used: Rolling walker (2 wheels) Transfers: Sit to/from Stand Sit to Stand: From elevated surface, Min guard           General transfer comment: slow to rise    Ambulation/Gait Ambulation/Gait assistance: Min assist Gait Distance (Feet): 180 Feet Assistive device: Rolling walker (2 wheels) Gait Pattern/deviations: Step-through pattern Gait velocity: decr     General Gait Details: slow to  move, repoerted feeling dizzy near end, BP 144/96  Stairs            Wheelchair Mobility    Modified Rankin (Stroke Patients Only)       Balance Overall balance assessment: History of Falls, Needs assistance Sitting-balance support: Feet supported, No upper extremity supported Sitting balance-Leahy Scale: Good     Standing balance support: During functional activity, Bilateral upper extremity supported, Reliant on assistive device for balance Standing balance-Leahy Scale: Fair                               Pertinent Vitals/Pain Pain Assessment Pain Assessment: Faces Faces Pain Scale: Hurts little more Pain Location: abdomen Pain Descriptors / Indicators: Grimacing, Discomfort, Tightness Pain Intervention(s):  Monitored during session    Home Living Family/patient expects to be discharged to:: Private residence Living Arrangements: Alone Available Help at Discharge: Family Type of Home:  Apartment Home Access: Level entry       Home Layout: One level Home Equipment: None Additional Comments: does not have assistance, has 3 cats    Prior Function Prior Level of Function : Independent/Modified Independent                     Hand Dominance   Dominant Hand: Right    Extremity/Trunk Assessment   Upper Extremity Assessment Upper Extremity Assessment: Overall WFL for tasks assessed    Lower Extremity Assessment Lower Extremity Assessment: Generalized weakness    Cervical / Trunk Assessment Cervical / Trunk Assessment: Normal  Communication   Communication: No difficulties  Cognition Arousal/Alertness: Awake/alert Behavior During Therapy: WFL for tasks assessed/performed Overall Cognitive Status: Within Functional Limits for tasks assessed                                          General Comments      Exercises     Assessment/Plan    PT Assessment Patient needs continued PT services  PT Problem List Decreased mobility;Decreased knowledge of precautions;Obesity;Decreased activity tolerance;Cardiopulmonary status limiting activity;Decreased balance;Pain;Decreased knowledge of use of DME       PT Treatment Interventions DME instruction;Therapeutic activities;Gait training;Therapeutic exercise;Patient/family education;Functional mobility training    PT Goals (Current goals can be found in the Care Plan section)  Acute Rehab PT Goals Patient Stated Goal: go home PT Goal Formulation: With patient Time For Goal Achievement: 03/02/22 Potential to Achieve Goals: Good    Frequency Min 3X/week     Co-evaluation               AM-PAC PT "6 Clicks" Mobility  Outcome Measure Help needed turning from your back to your side while in a flat bed without using bedrails?: A Little Help needed moving from lying on your back to sitting on the side of a flat bed without using bedrails?: A Little Help needed moving to and from a bed  to a chair (including a wheelchair)?: A Little Help needed standing up from a chair using your arms (e.g., wheelchair or bedside chair)?: A Little Help needed to walk in hospital room?: A Little Help needed climbing 3-5 steps with a railing? : Total 6 Click Score: 16    End of Session Equipment Utilized During Treatment: Gait belt Activity Tolerance: Patient tolerated treatment well;Treatment limited secondary to medical complications (Comment) Patient left: in chair;with call bell/phone within reach;with nursing/sitter in room Nurse Communication: Mobility status PT Visit Diagnosis: Unsteadiness on feet (R26.81);History of falling (Z91.81);Difficulty in walking, not elsewhere classified (R26.2);Dizziness and giddiness (R42)    Time: 3710-6269 PT Time Calculation (min) (ACUTE ONLY): 28 min   Charges:   PT Evaluation $PT Eval Low Complexity: 1 Low PT Treatments $Gait Training: 8-22 mins       [place] Office [phone] Weekend [phone]   Claretha Cooper 02/16/2022, 10:57 AM

## 2022-02-16 NOTE — TOC Initial Note (Addendum)
Transition of Care [facility]) - Initial/Assessment Note    Patient Details  Name: Jacob [name] MRN: [id number] Date of Birth: [birth year]  Transition of Care [facility]) CM/SW Contact:    Roseanne Kaufman, RN Phone Number: 02/16/2022, 3:11 PM  Clinical Narrative:      PT has recommended HHPT, attempt to reach patient to offer choice for HHPT, unsuccessful.   TOC will continue to follow.            - 4:15p spoke with patient at bedside, advised of PT recommendation for HHPT. Patient declined HHPT, reports he has 3 cats and prefers not to have [place] in home. RNCM questioned patient about ability to get outpatient PT. Patient prefers to do exercise at home, advised to discuss with PT on next visit.   TOC will continue to follow.  Expected Discharge Plan: [place] Barriers to Discharge: Continued Medical Work up   Patient Goals and CMS Choice        Expected Discharge Plan and Services Expected Discharge Plan: [place]   Discharge Planning Services: CM Consult                                          Prior Living Arrangements/Services   Lives with:: Self                   Activities of Daily Living Home Assistive Devices/Equipment: Eyeglasses ADL Screening (condition at time of admission) Patient's cognitive ability adequate to safely complete daily activities?: Yes Is the patient deaf or have difficulty hearing?: No Does the patient have difficulty seeing, even when wearing glasses/contacts?: No Does the patient have difficulty concentrating, remembering, or making decisions?: No Patient able to express need for assistance with ADLs?: Yes Does the patient have difficulty dressing or bathing?: No Independently performs ADLs?: Yes (appropriate for developmental age) Does the patient have difficulty walking or climbing stairs?: Yes Weakness of Legs: Both Weakness of Arms/Hands: Both  Permission Sought/Granted                   Emotional Assessment              Admission diagnosis:  Prostate cancer [facility] [C61] Patient Active Problem List   Diagnosis Date Noted   Prostate cancer ([place]) 02/14/2022   Urinary retention 07/28/2021   BPH (benign prostatic hyperplasia) 03/17/2021   Amber-colored urine 11/15/2019   Housing problems 03/21/2019   Severe recurrent major depression without psychotic features ([place]) 10/04/2018   Depression, recurrent ([place]) 09/17/2018   Urinary frequency 02/09/2018   Syncope and collapse 06/09/2016   Hepatic steatosis 40/98/1191   Biliary colic    Uncontrolled type 2 diabetes mellitus with diabetic neuropathy    ACS (acute coronary syndrome) ([place]) 06/08/2016   Erectile dysfunction 08/23/2013   Ventral hernia with obstruction 09/09/2011   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   OSA (obstructive sleep apnea) 08/23/2011   Colonic polyp 08/04/2011   Cataract 12/01/2010   Chest pain 07/15/2010   Type II diabetes mellitus with neurological manifestations, uncontrolled 10/03/2008   HYPERLIPIDEMIA 02/19/2007   Gout, unspecified 06/29/2006   Obesity, Class II, BMI 35-39.9, with comorbidity 06/29/2006   MDD (major depressive disorder), recurrent episode, mild ([place]) 06/29/2006   Anxiety state 06/29/2006   Essential hypertension, benign 06/29/2006   PCP:  Alcus Dad, MD Pharmacy:   [facility]  [address] [place] [address] Phone: [phone] Fax: [phone]  [place], [place] [address] Phone: [phone] Fax: [phone]  [id number] [place]: [phone]: [phone] [id number] [address]  [id number]: [phone]:  [phone]   Social Determinants of Health (SDOH) Interventions    Readmission Risk Interventions     No data to display

## 2022-02-16 NOTE — Progress Notes (Signed)
Patient ID: ZEVIN NEVARES, male   DOB: 1956-02-05, 66 y.o.   MRN: [id number]  Pt doing better.  94% off oxygen.  Tolerating diet.  Nausea better off narcotics.  Pain slightly increased.  PT eval suggesting need for HHPT.  OT eval pending.  Patient lives alone and does not have help at home.  Final disposition/discharge will be pending PT/OT recommendations and arranging necessary supplies, services, etc.

## 2022-02-16 NOTE — Plan of Care (Signed)
  Problem: Education: Goal: Knowledge of General Education information will improve Description Including pain rating scale, medication(s)/side effects and non-pharmacologic comfort measures Outcome: Progressing   Problem: Health Behavior/Discharge Planning: Goal: Ability to manage health-related needs will improve Outcome: Progressing   

## 2022-02-16 NOTE — Evaluation (Signed)
Occupational Therapy Evaluation Patient Details Name: Jacob [name] MRN: [id number] DOB: [birth year] Today's Date: 02/16/2022   History of Present Illness 5 yp maled admitted with diagnosis of prostate cancer, S/P Robotic assisted laparoscopic radical prostatectomy (non nerve sparing) and Bilateral robotic assisted laparoscopic pelvic lymphadenectomy   on 01/1622. ION:GEXBMWU, depression, HTN, DM2, OSA,syncope collapse, gout hernia rpair.   Clinical Impression   [name] is 66 year old male with above medical history. Prior to hospitalization patient lived alone with three cats. Patient now presents with pain, decreased activity tolerance, decreased ROM for LB ADLS, and decreased strength. Patient min guard for supine to sit and sit to stand. Patient stated feeling dizzy and nauseous when walked with physical therapist, BP was monitored during session. Bp 139/81 in laying, 134/75 in sitting, and 139/89 in standing. Therapy was limited by patient's pain, stating an 8 in laying ans with activity. Patient Mod A for LB bathing/dressing tasks- limited by pain. Of note pt reports having r-sided numbness onset three weeks ago and notified doctor while in [place] office but reports no follow up. Strength is equal on both sides and no focal neurological deficits. Patient does not have consistent assistance at home, and would not be safe to discharge home at this time. Patient stated having a sister but does not think she can assist due to own children.  Discussed with patient about SNF at discharge, but patient seemed hesitant. If patient shows progress tomorrow HHOT could be a possibility.     Recommendations for follow up therapy are one component of a multi-disciplinary discharge planning process, led by the attending physician.  Recommendations may be updated based on patient status, additional functional criteria and insurance authorization.   Follow Up Recommendations  Other (comment) (HHOT vs  SNF pending progess)    Assistance Recommended at Discharge Frequent or constant Supervision/Assistance  Patient can return home with the following A lot of help with bathing/dressing/bathroom;A lot of help with walking and/or transfers;Assistance with cooking/housework;Assist for transportation;Help with stairs or ramp for entrance    Functional Status Assessment  Patient has had a recent decline in their functional status and demonstrates the ability to make significant improvements in function in a reasonable and predictable amount of time.  Equipment Recommendations  None recommended by OT    Recommendations for Other Services       Precautions / Restrictions Precautions Precautions: Fall Precaution Comments: has had low BP, right drain, H/O fall Restrictions Weight Bearing Restrictions: No      Mobility Bed Mobility Overal bed mobility: Needs Assistance Bed Mobility: Supine to Sit     Supine to sit: Min guard     General bed mobility comments: log roll techinque, increased time    Transfers Overall transfer level: Needs assistance Equipment used: Rolling walker (2 wheels) Transfers: Sit to/from Stand Sit to Stand: Min guard           General transfer comment: slow to rise      Balance Overall balance assessment: History of Falls, Needs assistance Sitting-balance support: Feet supported, No upper extremity supported Sitting balance-Leahy Scale: Fair     Standing balance support: During functional activity, Bilateral upper extremity supported, Reliant on assistive device for balance Standing balance-Leahy Scale: Poor                             ADL either performed or assessed with clinical judgement   ADL Overall ADL's : Needs  assistance/impaired Eating/Feeding: Independent;Sitting   Grooming: Independent;Sitting   Upper Body Bathing: Set up;Sitting   Lower Body Bathing: Moderate assistance;Sit to/from stand   Upper Body Dressing :  Set up;Sitting   Lower Body Dressing: Sit to/from stand;Maximal assistance   Toilet Transfer: Economist and Hygiene: Moderate assistance;Sit to/from stand       Functional mobility during ADLs: Minimal assistance       Vision         Perception     Praxis      Pertinent Vitals/Pain Pain Assessment Pain Assessment: 0-10 Pain Score: 8  Pain Location: abdomen Pain Descriptors / Indicators: Grimacing, Discomfort, Tightness     Hand Dominance Right   Extremity/Trunk Assessment Upper Extremity Assessment Upper Extremity Assessment: RUE deficits/detail;LUE deficits/detail RUE Deficits / Details: WFL-ROM, shoulder 5/5, elbow 5/5, patient c/o r-sided numbness onset about three weeks ago. patient told a doctor when the symptoms occured, but nothing was tested. Pt. has a fear of possible CVA RUE Sensation: WNL RUE Coordination: WNL LUE Deficits / Details: WFL-ROM, shoulder 5/5, elbow 5/5 LUE Sensation: WNL LUE Coordination: WNL       Cervical / Trunk Assessment Cervical / Trunk Assessment: Normal   Communication Communication Communication: No difficulties   Cognition Arousal/Alertness: Awake/alert Behavior During Therapy: WFL for tasks assessed/performed Overall Cognitive Status: Within Functional Limits for tasks assessed                                       General Comments       Exercises     Shoulder Instructions      Home Living Family/patient expects to be discharged to:: Private residence Living Arrangements: Alone Available Help at Discharge: Available PRN/intermittently;Family Type of Home: Apartment Home Access: Level entry     Home Layout: One level     Bathroom Shower/Tub: Teacher, early years/pre: Standard     Home Equipment: Grab bars - tub/shower   Additional Comments: does not have assistance, has 3 cats      Prior Functioning/Environment Prior Level  of Function : Independent/Modified Independent                        OT Problem List: Decreased strength;Decreased range of motion;Decreased activity tolerance;Pain;Decreased knowledge of precautions;Impaired sensation      OT Treatment/Interventions: Self-care/ADL training;Therapeutic exercise;Modalities;Patient/family education;DME and/or AE instruction;Therapeutic activities    OT Goals(Current goals can be found in the care plan section) Acute Rehab OT Goals Patient Stated Goal: to go home OT Goal Formulation: With patient Potential to Achieve Goals: Good ADL Goals Pt Will Perform Lower Body Dressing: with adaptive equipment;with modified independence Pt Will Transfer to Toilet: Independently;regular height toilet Additional ADL Goal #1: Patient will stand at sink to perform grooming task as evidence of improving activity tolerance  OT Frequency: Min 2X/week    Co-evaluation              AM-PAC OT "6 Clicks" Daily Activity     Outcome Measure Help from another person eating meals?: None Help from another person taking care of personal grooming?: None Help from another person toileting, which includes using toliet, bedpan, or urinal?: A Lot Help from another person bathing (including washing, rinsing, drying)?: A Lot Help from another person to put on and taking off regular upper body clothing?: A Little Help  from another person to put on and taking off regular lower body clothing?: A Lot 6 Click Score: 17   End of Session Equipment Utilized During Treatment: Gait belt;Rolling walker (2 wheels) Nurse Communication: Mobility status (patient able to stand at EOB)  Activity Tolerance: Patient tolerated treatment well Patient left: in bed;with bed alarm set;with call bell/phone within reach  OT Visit Diagnosis: Pain Pain - Right/Left:  (abdomen)                Time: 0110-0349 OT Time Calculation (min): 18 min Charges:  OT General Charges $OT Visit: 1 Visit OT  Evaluation $OT Eval Low Complexity: [place], OTS Acute rehab services   [name] 02/16/2022, 5:14 PM

## 2022-02-17 LAB — GLUCOSE, CAPILLARY
Glucose-Capillary: 189 mg/dL — ABNORMAL HIGH (ref 70–99)
Glucose-Capillary: 139 mg/dL — ABNORMAL HIGH (ref 70–99)
Glucose-Capillary: 192 mg/dL — ABNORMAL HIGH (ref 70–99)

## 2022-02-17 LAB — SURGICAL PATHOLOGY

## 2022-02-17 MED ORDER — ORAL CARE MOUTH RINSE: 15.0000 mL | OROMUCOSAL | Status: DC | PRN

## 2022-02-17 NOTE — Progress Notes (Signed)
Occupational Therapy Treatment Patient Details Name: DYAN CREELMAN MRN: [id number] DOB: 03-02-1956 Today's Date: 02/17/2022   History of present illness 61 yp maled admitted with diagnosis of prostate cancer, S/P Robotic assisted laparoscopic radical prostatectomy (non nerve sparing) and Bilateral robotic assisted laparoscopic pelvic lymphadenectomy   on 01/1622. AYT:[id number], depression, HTN, DM2, OSA,syncope collapse, gout hernia rpair.   OT comments  Treatment focused on completing dressing tasks to promote independence at discharge. Patient mod I for supine to sit and min guard for sit to stand. Patient min guard for donning of underwear and socks at EOB. Therapist discussed with patient follow up recommendations. Patient is able to complete ADLs with set up, but requires assistance for meal prep, transportation, and house management. Patient does not have intermittent assistance due to his sister being sick with Covid. Therapist discussed with patient that home health OT would be the best option to optimize home safety and improve ability to complete IADLs without increased assistance. Patient seems to be agreeable to this recommendation. Continued to educate patient on no lifting items over 10lbs for 4-6 weeks after surgey and decrease bending at the waist when doing daily tasks. Continue poc.    Recommendations for follow up therapy are one component of a multi-disciplinary discharge planning process, led by the attending physician.  Recommendations may be updated based on patient status, additional functional criteria and insurance authorization.    Follow Up Recommendations  Home health OT    Assistance Recommended at Discharge Intermittent Supervision/Assistance  Patient can return home with the following  Assistance with cooking/housework;Assist for transportation;Help with stairs or ramp for entrance;A little help with bathing/dressing/bathroom;A little help with walking and/or  transfers   Equipment Recommendations  None recommended by OT    Recommendations for Other Services      Precautions / Restrictions Precautions Precautions: Fall Precaution Comments: has had low BP, right drain, H/O fall Restrictions Weight Bearing Restrictions: No       Mobility Bed Mobility Overal bed mobility: Needs Assistance Bed Mobility: Supine to Sit     Supine to sit: Modified independent (Device/Increase time)          Transfers Overall transfer level: Needs assistance Equipment used: Rolling walker (2 wheels) Transfers: Sit to/from Stand Sit to Stand: Min guard                 Balance Overall balance assessment: History of Falls, Needs assistance Sitting-balance support: Feet supported, No upper extremity supported Sitting balance-Leahy Scale: Fair     Standing balance support: During functional activity, Bilateral upper extremity supported, Reliant on assistive device for balance Standing balance-Leahy Scale: Poor                             ADL either performed or assessed with clinical judgement   ADL                       Lower Body Dressing: Sit to/from stand;Set up                      Extremity/Trunk Assessment Upper Extremity Assessment Upper Extremity Assessment: RUE deficits/detail;LUE deficits/detail RUE Deficits / Details: WFL-ROM, shoulder 5/5, elbow 5/5, patient c/o r-sided numbness onset about three weeks ago. patient told a doctor when the symptoms occured, but nothing was tested. Pt. has a fear of possible CVA RUE Sensation: WNL RUE Coordination: WNL LUE Deficits / Details: WFL-ROM,  shoulder 5/5, elbow 5/5 LUE Sensation: WNL LUE Coordination: WNL       Cervical / Trunk Assessment Cervical / Trunk Assessment: Normal    Vision       Perception     Praxis      Cognition Arousal/Alertness: Awake/alert Behavior During Therapy: WFL for tasks assessed/performed Overall Cognitive Status:  Within Functional Limits for tasks assessed                                          Exercises      Shoulder Instructions       General Comments      Pertinent Vitals/ Pain       Pain Assessment Pain Assessment: 0-10 Pain Score: 8  Pain Location: abdomen Pain Descriptors / Indicators: Grimacing, Discomfort, Tightness  Home Living                                          Prior Functioning/Environment              Frequency  Min 2X/week        Progress Toward Goals  OT Goals(current goals can now be found in the care plan section)  Progress towards OT goals: Progressing toward goals     Plan Discharge plan remains appropriate    Co-evaluation                 AM-PAC OT "6 Clicks" Daily Activity     Outcome Measure   Help from another person eating meals?: None Help from another person taking care of personal grooming?: None Help from another person toileting, which includes using toliet, bedpan, or urinal?: A Little Help from another person bathing (including washing, rinsing, drying)?: A Little Help from another person to put on and taking off regular upper body clothing?: A Little Help from another person to put on and taking off regular lower body clothing?: A Little 6 Click Score: 20    End of Session Equipment Utilized During Treatment: Rolling walker (2 wheels)  OT Visit Diagnosis: Pain Pain - part of body:  (abdomen)   Activity Tolerance Patient tolerated treatment well   Patient Left in bed;with bed alarm set;with call bell/phone within reach   Nurse Communication Mobility status (discussed with patient about HHOT)        Time: 0100-7121 OT Time Calculation (min): 19 min  Charges: OT General Charges $OT Visit: 1 Visit OT Treatments $Self Care/Home Management : 8-22 mins  [name], OTS Acute rehab services   [name] 02/17/2022, 12:46 PM

## 2022-02-17 NOTE — TOC Progression Note (Signed)
Transition of Care [facility]) - Progression Note    Patient Details  Name: Jacob [name] MRN: [id number] Date of Birth: May 20, 1955  [facility] Care [facility]) CM/SW [place], RN Phone Number: 02/17/2022, 3:21 PM  Clinical Narrative:   Received order for DME: walker, offered choice, notified Danielle with [facility].   Gilford Rile has been delivered. Offered choice for HHPT per PT recommendation. HHPT. Notified ashley with [facility] who will follow after discharge.   Provided taxi voucher for transportation home.   No additional TOC needs        Expected Discharge Plan: [place] Barriers to Discharge: Continued Medical Work up  Expected Discharge Plan and Services Expected Discharge Plan: [place]   Discharge Planning Services: CM Consult     Expected Discharge Date: 02/17/22                                     Social Determinants of Health (SDOH) Interventions    Readmission Risk Interventions     No data to display

## 2022-02-17 NOTE — Discharge Summary (Signed)
Date of admission: 02/14/2022  Date of discharge: 02/17/2022  Admission diagnosis: Prostate Cancer  Discharge diagnosis: Prostate Cancer  History and Physical: For full details, please see admission history and physical. Briefly, Jacob [name] is a 66 y.o. gentleman with localized prostate cancer.  After discussing management/treatment options, he elected to proceed with surgical treatment.  Hospital Course: JAHAAD PENADO was taken to the operating room on 02/14/2022 and underwent a robotic assisted laparoscopic radical prostatectomy. He tolerated this procedure well and without complications. Postoperatively, he was able to be transferred to a regular hospital room following recovery from anesthesia.  He was able to begin ambulating the night of surgery. He remained hemodynamically stable overnight.  His urine output was a bit low and his drain output had been high right after surgery.  Drain Cr was mildly elevated on POD #1 and was left in place.  He was transitioned to oral pain medication, tolerated a clear liquid diet, and his drain Cr on POD #2 had normalized and his drain was removed.  He had some persistent nausea which resolved upon discontinuation of his narcotic pain medication.  He was evaluated by PT and OT due to living alone at home and they recommended HHPT and [place].  He was discharged home on POD # 3.  Laboratory values:  Recent Labs    02/14/22 1834 02/15/22 0938  HGB 9.4* 8.6*  HCT 28.8* 25.6*    Disposition: Home  Discharge instruction: He was instructed to be ambulatory but to refrain from heavy lifting, strenuous activity, or driving. He was instructed on urethral catheter care.  Discharge medications:   Allergies as of 02/17/2022       Reactions   Methylprednisolone Sodium Succ    Respiratory failure Pt states he has never had respiratory failure, does not recall this reaction   Ms Contin [morphine] Nausea Only        Medication List     STOP  taking these medications    aspirin EC 81 MG tablet   Melatonin 5 MG Caps   naproxen sodium 220 MG tablet Commonly known as: ALEVE   tamsulosin 0.4 MG Caps capsule Commonly known as: FLOMAX       TAKE these medications    acetaminophen 500 MG tablet Commonly known as: TYLENOL Take 500 mg by mouth daily as needed.   amLODipine 10 MG tablet Commonly known as: NORVASC TAKE 1 TABLET BY MOUTH EVERY DAY. This will be your last refill until you are seen in the office to discuss blood pressure. What changed:  how much to take how to take this when to take this additional instructions   docusate sodium 100 MG capsule Commonly known as: COLACE Take 1 capsule (100 mg total) by mouth 2 (two) times daily.   icosapent Ethyl 1 g capsule Commonly known as: Vascepa Take 2 capsules (2 g total) by mouth 2 (two) times daily.   metFORMIN 1000 MG tablet Commonly known as: GLUCOPHAGE TAKE 1 TABLET (1,000 MG TOTAL) BY MOUTH 2 (TWO) TIMES DAILY WITH A MEAL.   rosuvastatin 40 MG tablet Commonly known as: CRESTOR Take 1 tablet (40 mg total) by mouth daily.   sulfamethoxazole-trimethoprim 800-160 MG tablet Commonly known as: BACTRIM DS Take 1 tablet by mouth 2 (two) times daily. Start the day prior to foley removal appointment What changed: additional instructions   traZODone 50 MG tablet Commonly known as: DESYREL Take 1 tablet (50 mg total) by mouth at bedtime.  Followup: He will followup in 1 week for catheter removal and to discuss his surgical pathology results.

## 2022-02-17 NOTE — Progress Notes (Signed)
D/C instructions explained to patient who verbalized understanding. Foley/catheter care also demonstrated to pt who verbalized understanding. Pt emptied his own catheter bag in toilet prior to D/C. Leg bag placed on patient per request prior to D/C- pt educated on how and when to switch back to main foley bag. Foley bag and extra leg strap sent home with patient. Walker delivered to pt's room prior to D/C- taxi voucher also received for transport. HH arranged per CM.

## 2022-02-17 NOTE — Progress Notes (Signed)
PT Cancellation Note  Patient Details Name: Jacob [name] MRN: [id number] DOB: Jul 15, 1955   Cancelled Treatment:    Reason Eval/Treat Not Completed: Patient declined, wants to be home with his cats. PT  offered to ambulate patient at ~ 900, pt. Declined. Discussed with patient strong recommendation by PT for HHPT or short rehab at [facility]. Patient stated he waould think about it. Continue  PT as patient participates.    Claretha Cooper 02/17/2022, 1:28 PM

## 2022-02-17 NOTE — Progress Notes (Addendum)
Pt's SpO2 dropped 88% on RA. But pt doesn't complain of any SOB and  show any distress. Pt states he feels fine. Started 3 L O2 via [place]  and pt is on 2 L O2 now. Will continue to monitor.     '@0244'$ : Pt on RA and SpO2 is 92%

## 2022-02-17 NOTE — Progress Notes (Signed)
Patient ID: Jacob [name], male   DOB: 1956/03/01, 66 y.o.   MRN: [id number]  3 Days Post-Op Subjective: Pt feeling better.  Ambulating more easily.  Tolerating regular diet.  Passing flatus regularly.    Objective: Vital signs in last 24 hours: Temp:  [98.6 F (37 C)-100.3 F (37.9 C)] 100.3 F (37.9 C) (10/19 0345) Pulse Rate:  [88-99] 99 (10/19 0345) Resp:  [17-20] 20 (10/19 0345) BP: (111-128)/(51-85) 128/78 (10/19 0345) SpO2:  [88 %-100 %] 94 % (10/19 0345)  Intake/Output from previous day: 10/18 0701 - 10/19 0700 In: 474 [P.O.:474] Out: 1275 [Urine:400; Drains:875] Intake/Output this shift: Total I/O In: 474 [P.O.:474] Out: 400 [Urine:400]  Physical Exam:  General: Alert and oriented Abdomen: Soft, ND, positive BS Incisions: C/D/I Ext: NT, No erythema  Lab Results: Recent Labs    02/14/22 1156 02/14/22 1834 02/15/22 0938  HGB 9.7* 9.4* 8.6*  HCT 30.2* 28.8* 25.6*   BMET Recent Labs    02/14/22 1156  NA 136  K 3.8  CL 107  CO2 25  GLUCOSE 219*  BUN 16  CREATININE 1.16  CALCIUM 7.2*     Studies/Results: No results found.  Assessment/Plan: POD # 3 s/p RALP/BPLND - Awaiting final recommendations from PT/OT to determine disposition.  Should be able to discharge today to either SNF or home with HHPT and HHOT.   LOS: 2 days   Jacob [name] 02/17/2022, 6:12 AM

## 2022-02-17 NOTE — Progress Notes (Signed)
Physical Therapy Treatment Patient Details Name: Jacob [name] MRN: [id number] DOB: [birth year] Today's Date: 02/17/2022   History of Present Illness 60 yp maled admitted with diagnosis of prostate cancer, S/P Robotic assisted laparoscopic radical prostatectomy (non nerve sparing) and Bilateral robotic assisted laparoscopic pelvic lymphadenectomy   on 01/1622. WUJ:WJXBJYN, depression, HTN, DM2, OSA,syncope collapse, gout hernia rpair.    PT Comments    The patient requesting  to go to BR, patient ambulated with supervision using RW , some difficulty maneuvering the RW around bed and into door of BR. At times  not holding onto RW. Patient  ambulated to and from BR x 2, urgency for BM. Noted blood in toilet, RN in to take over. Patient expresses concern  for getting home,sister is sick and not availble. RN aware of challenges.  Patient has accepted HHPT, RW delivered.      Recommendations for follow up therapy are one component of a multi-disciplinary discharge planning process, led by the attending physician.  Recommendations may be updated based on patient status, additional functional criteria and insurance authorization.  Follow Up Recommendations  Home health PT     Assistance Recommended at Discharge Set up Supervision/Assistance  Patient can return home with the following A little help with bathing/dressing/bathroom;Help with stairs or ramp for entrance;Assistance with cooking/housework;Assist for transportation   Equipment Recommendations  Rolling walker (2 wheels)    Recommendations for Other Services       Precautions / Restrictions Precautions Precautions: Fall Precaution Comments: catheter Restrictions Weight Bearing Restrictions: No     Mobility  Bed Mobility   Bed Mobility: Supine to Sit, Sit to Supine     Supine to sit: Modified independent (Device/Increase time)          Transfers Overall transfer level: Needs assistance Equipment used: Rolling walker  (2 wheels) Transfers: Sit to/from Stand Sit to Stand: Supervision           General transfer comment: rises from bed and toilet, use of rail and RW    Ambulation/Gait Ambulation/Gait assistance: Supervision Gait Distance (Feet):  (x 3) Assistive device: Rolling walker (2 wheels) Gait Pattern/deviations: Step-through pattern       General Gait Details: at times not using Rw, when getting around the end of bed, opening door .   Stairs             Wheelchair Mobility    Modified Rankin (Stroke Patients Only)       Balance Overall balance assessment: History of Falls, Needs assistance Sitting-balance support: Feet supported, No upper extremity supported Sitting balance-Leahy Scale: Good     Standing balance support: During functional activity, Reliant on assistive device for balance, No upper extremity supported Standing balance-Leahy Scale: Fair                              Cognition Arousal/Alertness: Awake/alert Behavior During Therapy: Anxious                                            Exercises      General Comments        Pertinent Vitals/Pain Pain Assessment Pain Assessment: Faces Faces Pain Scale: Hurts little more Pain Location: abdomen Pain Descriptors / Indicators: Grimacing, Discomfort, Tightness Pain Intervention(s): Monitored during session    Home Living  Prior Function            PT Goals (current goals can now be found in the care plan section) Progress towards PT goals: Progressing toward goals    Frequency    Min 3X/week      PT Plan Current plan remains appropriate    Co-evaluation              AM-PAC PT "6 Clicks" Mobility   Outcome Measure  Help needed turning from your back to your side while in a flat bed without using bedrails?: None Help needed moving from lying on your back to sitting on the side of a flat bed without using  bedrails?: None Help needed moving to and from a bed to a chair (including a wheelchair)?: A Little Help needed standing up from a chair using your arms (e.g., wheelchair or bedside chair)?: A Little Help needed to walk in hospital room?: A Little Help needed climbing 3-5 steps with a railing? : Total 6 Click Score: 18    End of Session   Activity Tolerance: Patient tolerated treatment well;Treatment limited secondary to medical complications (Comment) Patient left:  (in bathroom w. RN) Nurse Communication: Mobility status PT Visit Diagnosis: Unsteadiness on feet (R26.81);History of falling (Z91.81);Difficulty in walking, not elsewhere classified (R26.2);Dizziness and giddiness (R42)     Time: 8891-6945 PT Time Calculation (min) (ACUTE ONLY): 53 min  Charges:  $Gait Training: 23-37 mins $Self Care/Home Management: [place] Office [phone] Weekend [phone]    Claretha Cooper 02/17/2022, 3:28 PM

## 2022-02-18 ENCOUNTER — Telehealth: Payer: Self-pay

## 2022-02-18 NOTE — Patient Outreach (Signed)
  Care Coordination TOC Note Transition Care Management Unsuccessful Follow-up Telephone Call  Date of discharge and from where:  [facility] 02/14/22-02/17/22  Attempts:  1st Attempt  Reason for unsuccessful TCM follow-up call:  No answer/busy  Johnney Killian, RN, BSN, CCM Care Management Coordinator [facility] Phone: [phone]: [phone]

## 2022-02-19 DIAGNOSIS — Z483 Aftercare following surgery for neoplasm: Secondary | ICD-10-CM | POA: Diagnosis not present

## 2022-02-19 DIAGNOSIS — Z7984 Long term (current) use of oral hypoglycemic drugs: Secondary | ICD-10-CM | POA: Diagnosis not present

## 2022-02-19 DIAGNOSIS — M109 Gout, unspecified: Secondary | ICD-10-CM | POA: Diagnosis not present

## 2022-02-19 DIAGNOSIS — E785 Hyperlipidemia, unspecified: Secondary | ICD-10-CM | POA: Diagnosis not present

## 2022-02-19 DIAGNOSIS — Z9181 History of falling: Secondary | ICD-10-CM | POA: Diagnosis not present

## 2022-02-19 DIAGNOSIS — K219 Gastro-esophageal reflux disease without esophagitis: Secondary | ICD-10-CM | POA: Diagnosis not present

## 2022-02-19 DIAGNOSIS — R338 Other retention of urine: Secondary | ICD-10-CM | POA: Diagnosis not present

## 2022-02-19 DIAGNOSIS — E119 Type 2 diabetes mellitus without complications: Secondary | ICD-10-CM | POA: Diagnosis not present

## 2022-02-19 DIAGNOSIS — I10 Essential (primary) hypertension: Secondary | ICD-10-CM | POA: Diagnosis not present

## 2022-02-19 DIAGNOSIS — F32A Depression, unspecified: Secondary | ICD-10-CM | POA: Diagnosis not present

## 2022-02-19 DIAGNOSIS — Z960 Presence of urogenital implants: Secondary | ICD-10-CM | POA: Diagnosis not present

## 2022-02-21 DIAGNOSIS — Z7984 Long term (current) use of oral hypoglycemic drugs: Secondary | ICD-10-CM | POA: Diagnosis not present

## 2022-02-21 DIAGNOSIS — R338 Other retention of urine: Secondary | ICD-10-CM | POA: Diagnosis not present

## 2022-02-21 DIAGNOSIS — Z9181 History of falling: Secondary | ICD-10-CM | POA: Diagnosis not present

## 2022-02-21 DIAGNOSIS — E785 Hyperlipidemia, unspecified: Secondary | ICD-10-CM | POA: Diagnosis not present

## 2022-02-21 DIAGNOSIS — K219 Gastro-esophageal reflux disease without esophagitis: Secondary | ICD-10-CM | POA: Diagnosis not present

## 2022-02-21 DIAGNOSIS — F32A Depression, unspecified: Secondary | ICD-10-CM | POA: Diagnosis not present

## 2022-02-21 DIAGNOSIS — E119 Type 2 diabetes mellitus without complications: Secondary | ICD-10-CM | POA: Diagnosis not present

## 2022-02-21 DIAGNOSIS — Z960 Presence of urogenital implants: Secondary | ICD-10-CM | POA: Diagnosis not present

## 2022-02-21 DIAGNOSIS — M109 Gout, unspecified: Secondary | ICD-10-CM | POA: Diagnosis not present

## 2022-02-21 DIAGNOSIS — I10 Essential (primary) hypertension: Secondary | ICD-10-CM | POA: Diagnosis not present

## 2022-02-21 DIAGNOSIS — Z483 Aftercare following surgery for neoplasm: Secondary | ICD-10-CM | POA: Diagnosis not present

## 2022-02-22 ENCOUNTER — Telehealth: Payer: Self-pay

## 2022-02-22 NOTE — Patient Outreach (Signed)
  Care Coordination [facility] Note Transition Care Management Follow-up Telephone Call Date of discharge and from where: 02/17/22- [facility] Dx: prostate CA How have you been since you were released from the hospital? Patient voices that he is not feeling well today. [name] having some pain. [name] still trying to process that he has cancer and worried about what to expect in the future. Support given to patient.  Any questions or concerns? No  Items Reviewed: Did the pt receive and understand the discharge instructions provided? Yes  Medications obtained and verified? Yes -Pt has not been consistently taking his routine meds daily since being at home due to not feeling well-encouraged med adherence  Other? Yes  Any new allergies since your discharge? No  Dietary orders reviewed? Yes Do you have support at home? Yes -sister-pt reports that his sister is getting ready to go a cruise ina few days and he will be alone with no support  Home Care and Equipment/Supplies: Were home health services ordered? yes If so, what is the name of the agency? [facility]  Has the agency set up a time to come to the patient's home? Yes-had HH visit on yesterday Were any new equipment or medical supplies ordered?  No What is the name of the medical supply agency? N/A Were you able to get the supplies/equipment? not applicable Do you have any questions related to the use of the equipment or supplies? No  Functional Questionnaire: (I = Independent and D = Dependent) ADLs: I  Bathing/Dressing- I  Meal Prep- I  Eating- I  Maintaining continence- I  Transferring/Ambulation- I  Managing Meds- I  Follow up appointments reviewed:  PCP Hospital f/u appt confirmed? No  Patient has not made follow up appt.[facility] f/u appt confirmed? Yes  Patient was scheduled to see urologist today btu states his sister changed appt to later this week since he is not feeling well . Are transportation  arrangements needed? No  If their condition worsens, is the pt aware to call PCP or go to the Emergency Dept.? Yes Was the patient provided with contact information for the PCP's office or ED? Yes Was to pt encouraged to call back with questions or concerns? Yes  SDOH assessments and interventions completed:   Yes  Care Coordination Interventions Activated:  Yes   Care Coordination Interventions:  PCP follow up appointment requested Referred for Care Coordination Services:  RN Care Coordinator   Encounter Outcome:  Pt. Visit Completed    Enzo Montgomery, RN,BSN,CCM [place] [facility] Telephonic Care Management Coordinator Direct Phone: [phone] Toll Free: [phone] Fax: [phone]

## 2022-03-02 ENCOUNTER — Ambulatory Visit: Payer: Self-pay

## 2022-03-02 DIAGNOSIS — Z483 Aftercare following surgery for neoplasm: Secondary | ICD-10-CM | POA: Diagnosis not present

## 2022-03-02 DIAGNOSIS — E785 Hyperlipidemia, unspecified: Secondary | ICD-10-CM | POA: Diagnosis not present

## 2022-03-02 DIAGNOSIS — E119 Type 2 diabetes mellitus without complications: Secondary | ICD-10-CM | POA: Diagnosis not present

## 2022-03-02 DIAGNOSIS — Z960 Presence of urogenital implants: Secondary | ICD-10-CM | POA: Diagnosis not present

## 2022-03-02 DIAGNOSIS — K219 Gastro-esophageal reflux disease without esophagitis: Secondary | ICD-10-CM | POA: Diagnosis not present

## 2022-03-02 DIAGNOSIS — Z7984 Long term (current) use of oral hypoglycemic drugs: Secondary | ICD-10-CM | POA: Diagnosis not present

## 2022-03-02 DIAGNOSIS — F32A Depression, unspecified: Secondary | ICD-10-CM | POA: Diagnosis not present

## 2022-03-02 DIAGNOSIS — R338 Other retention of urine: Secondary | ICD-10-CM | POA: Diagnosis not present

## 2022-03-02 DIAGNOSIS — I10 Essential (primary) hypertension: Secondary | ICD-10-CM | POA: Diagnosis not present

## 2022-03-02 DIAGNOSIS — M109 Gout, unspecified: Secondary | ICD-10-CM | POA: Diagnosis not present

## 2022-03-02 DIAGNOSIS — Z9181 History of falling: Secondary | ICD-10-CM | POA: Diagnosis not present

## 2022-03-03 NOTE — Patient Instructions (Signed)
Visit Information  Thank you for taking time to visit with me today. Please don't hesitate to contact me if I can be of assistance to you.   Following are the goals we discussed today:   Goals Addressed             This Visit's Progress    COMPLETED: Care Coordination Activites -no follow up required        Care Coordination Interventions: Active listening / Reflection utilized  Emotional Support Provided Problem Campbell strategies reviewed Discussed/.Educated Care Coordination Program 2.   Discussed/.Educated Annual Wellness Visit 3.   Discussed/.Educated Social Determinates of Health 4.   Please inform PCP if services needed in the future         If you are experiencing a Mental Health or [place] or need someone to talk to, please call 1-800-273-TALK (toll free, 24 hour hotline)  The patient verbalized understanding of instructions, educational materials, and care plan provided today.  Lazaro Arms RN, BSN, [facility]   Phone: [phone]

## 2022-03-03 NOTE — Patient Outreach (Signed)
  Care Coordination   Initial Visit Note   03/02/2022 Name: Jacob [name] MRN: [id number] DOB: 1956-05-02  Jacob [name] is a 66 y.o. year old male who sees Alcus Dad, MD for primary care. I spoke with  Jacob [name] by phone today.  What matters to the patients health and wellness today?  I am doing fine for now but may need the service in the future.    Goals Addressed             This Visit's Progress    COMPLETED: Care Coordination Activites -no follow up required        Care Coordination Interventions: Active listening / Reflection utilized  Emotional Support Provided Problem Springer strategies reviewed Discussed/.Educated Care Coordination Program 2.   Discussed/.Educated Annual Wellness Visit 3.   Discussed/.Educated Social Determinates of Health 4.   Please inform PCP if services needed in the future         SDOH assessments and interventions completed:  No  SDOH Interventions Today    Flowsheet Row Most Recent Value  SDOH Interventions   Food Insecurity Interventions Intervention Not Indicated  Transportation Interventions Intervention Not Indicated        Care Coordination Interventions Activated:  Yes  Care Coordination Interventions:  Yes, provided   Follow up plan: No further intervention required.   Encounter Outcome:  Pt. Visit Completed   Lazaro Arms RN, BSN, [facility]   Phone: [phone]

## 2022-03-15 ENCOUNTER — Other Ambulatory Visit: Payer: Self-pay

## 2022-03-15 ENCOUNTER — Encounter (HOSPITAL_COMMUNITY): Payer: Self-pay

## 2022-03-15 ENCOUNTER — Emergency Department (HOSPITAL_COMMUNITY): Payer: Medicare Other

## 2022-03-15 ENCOUNTER — Emergency Department (HOSPITAL_COMMUNITY)
Admission: EM | Admit: 2022-03-15 | Discharge: 2022-03-15 | Discharge status: Against Medical Advice | Payer: Medicare Other | Attending: Medical | Admitting: Medical

## 2022-03-15 DIAGNOSIS — R519 Headache, unspecified: Secondary | ICD-10-CM | POA: Diagnosis not present

## 2022-03-15 DIAGNOSIS — M47812 Spondylosis without myelopathy or radiculopathy, cervical region: Secondary | ICD-10-CM | POA: Diagnosis not present

## 2022-03-15 DIAGNOSIS — R531 Weakness: Secondary | ICD-10-CM | POA: Insufficient documentation

## 2022-03-15 DIAGNOSIS — Z5321 Procedure and treatment not carried out due to patient leaving prior to being seen by health care provider: Secondary | ICD-10-CM | POA: Diagnosis not present

## 2022-03-15 DIAGNOSIS — Z20822 Contact with and (suspected) exposure to covid-19: Secondary | ICD-10-CM | POA: Diagnosis not present

## 2022-03-15 DIAGNOSIS — R112 Nausea with vomiting, unspecified: Secondary | ICD-10-CM | POA: Diagnosis not present

## 2022-03-15 DIAGNOSIS — T699XXA Effect of reduced temperature, unspecified, initial encounter: Secondary | ICD-10-CM | POA: Diagnosis not present

## 2022-03-15 DIAGNOSIS — R0689 Other abnormalities of breathing: Secondary | ICD-10-CM | POA: Diagnosis not present

## 2022-03-15 DIAGNOSIS — I6381 Other cerebral infarction due to occlusion or stenosis of small artery: Secondary | ICD-10-CM | POA: Diagnosis not present

## 2022-03-15 DIAGNOSIS — R404 Transient alteration of awareness: Secondary | ICD-10-CM | POA: Diagnosis not present

## 2022-03-15 DIAGNOSIS — R29898 Other symptoms and signs involving the musculoskeletal system: Secondary | ICD-10-CM | POA: Diagnosis not present

## 2022-03-15 DIAGNOSIS — R6889 Other general symptoms and signs: Secondary | ICD-10-CM | POA: Diagnosis not present

## 2022-03-15 DIAGNOSIS — R55 Syncope and collapse: Secondary | ICD-10-CM | POA: Diagnosis not present

## 2022-03-15 DIAGNOSIS — S0083XA Contusion of other part of head, initial encounter: Secondary | ICD-10-CM | POA: Diagnosis not present

## 2022-03-15 DIAGNOSIS — Z743 Need for continuous supervision: Secondary | ICD-10-CM | POA: Diagnosis not present

## 2022-03-15 LAB — CBC WITH DIFFERENTIAL/PLATELET
Abs Immature Granulocytes: 0.07 10*3/uL (ref 0.00–0.07)
Basophils Absolute: 0 10*3/uL (ref 0.0–0.1)
Basophils Relative: 0 %
Eosinophils Absolute: 0.1 10*3/uL (ref 0.0–0.5)
Eosinophils Relative: 1 %
HCT: 33.9 % — ABNORMAL LOW (ref 39.0–52.0)
Hemoglobin: 10.9 g/dL — ABNORMAL LOW (ref 13.0–17.0)
Immature Granulocytes: 1 %
Lymphocytes Relative: 7 %
Lymphs Abs: 0.9 10*3/uL (ref 0.7–4.0)
MCH: 27 pg (ref 26.0–34.0)
MCHC: 32.2 g/dL (ref 30.0–36.0)
MCV: 84.1 fL (ref 80.0–100.0)
Monocytes Absolute: 0.9 10*3/uL (ref 0.1–1.0)
Monocytes Relative: 7 %
Neutro Abs: 10.2 10*3/uL — ABNORMAL HIGH (ref 1.7–7.7)
Neutrophils Relative %: 84 %
Platelets: 225 10*3/uL (ref 150–400)
RBC: 4.03 MIL/uL — ABNORMAL LOW (ref 4.22–5.81)
RDW: 14.8 % (ref 11.5–15.5)
WBC: 12.2 10*3/uL — ABNORMAL HIGH (ref 4.0–10.5)
nRBC: 0 % (ref 0.0–0.2)

## 2022-03-15 LAB — COMPREHENSIVE METABOLIC PANEL
ALT: 12 U/L (ref 0–44)
AST: 15 U/L (ref 15–41)
Albumin: 3.3 g/dL — ABNORMAL LOW (ref 3.5–5.0)
Alkaline Phosphatase: 61 U/L (ref 38–126)
Anion gap: 9 (ref 5–15)
BUN: 16 mg/dL (ref 8–23)
CO2: 25 mmol/L (ref 22–32)
Calcium: 8.7 mg/dL — ABNORMAL LOW (ref 8.9–10.3)
Chloride: 103 mmol/L (ref 98–111)
Creatinine, Ser: 1.2 mg/dL (ref 0.61–1.24)
GFR, Estimated: >60 mL/min (ref >60)
Glucose, Bld: 205 mg/dL — ABNORMAL HIGH (ref 70–99)
Potassium: 4.1 mmol/L (ref 3.5–5.1)
Sodium: 137 mmol/L (ref 135–145)
Total Bilirubin: 0.4 mg/dL (ref 0.3–1.2)
Total Protein: 7.1 g/dL (ref 6.5–8.1)

## 2022-03-15 LAB — RESP PANEL BY RT-PCR (FLU A&B, COVID) ARPGX2
Influenza A by PCR: NEGATIVE
Influenza B by PCR: NEGATIVE
SARS Coronavirus 2 by RT PCR: NEGATIVE

## 2022-03-15 LAB — TROPONIN I (HIGH SENSITIVITY): Troponin I (High Sensitivity): 6 ng/L (ref <18)

## 2022-03-15 MED ORDER — TETANUS-DIPHTH-ACELL PERTUSSIS 5-2.5-18.5 LF-MCG/0.5 IM SUSY: 0.5000 mL | PREFILLED_SYRINGE | Freq: Once | INTRAMUSCULAR | Status: DC

## 2022-03-15 NOTE — ED Notes (Signed)
Called pt for vs check x3 no response

## 2022-03-15 NOTE — ED Triage Notes (Signed)
Pt arrives via [facility] for syncopal episode. Pt was at a car [employer] sitting and fell to the floor. Pt had second syncopal episode with EMS. 12 lead EKG unremarkable with EMS. EMS unable to obtain orthostatics because pt had another near syncopal episode. Received 300 mL NS BOLUS by EMS.   EMS last VS 122/66 (laying), HR 80, RR 16, 97% on RA. CBG 236.

## 2022-03-15 NOTE — ED Provider Triage Note (Signed)
Emergency Medicine Provider Triage Evaluation Note  Jacob [name] , a 66 y.o. male  was evaluated in triage.  Pt complains of R sided arm weaknessx3 weeks. Tingling has been persistent. Reports severe headache and nausea and vomiting after falling today at 1200. No blood thinner use. +LOC.  Review of Systems  Positive: Headache, R sided numbness Negative: Vision changes, difficulty with speech   Physical Exam  BP (!) 144/77 (BP Location: Right Arm)   Pulse 71   Temp 99.1 F (37.3 C) (Oral)   Resp (!) 22   SpO2 100%  Gen:   Awake, no distress, +hematoma to R eye, extraocular movements intact. Resp:  Normal effort  MSK:   Moves extremities without difficulty  Other:  Perceived sensory deficit to RUE, no other deficits  Medical Decision Making  Medically screening exam initiated at 2:48 PM.  Appropriate orders placed.  [name] was informed that the remainder of the evaluation will be completed by another provider, this initial triage assessment does not replace that evaluation, and the importance of remaining in the ED until their evaluation is complete.     Osvaldo Shipper, [place] 03/15/22 1456

## 2022-03-15 NOTE — ED Triage Notes (Signed)
During triage, pt also states that he started having R sided weakness approximately three weeks ago. No new symptoms today. Decreased sensation noted on R side as well.

## 2022-03-15 NOTE — ED Notes (Signed)
Pt stated that they were leaving due to wait times. *Note. This pt was put in otf prior to this note being added

## 2022-04-19 DIAGNOSIS — R109 Unspecified abdominal pain: Secondary | ICD-10-CM | POA: Diagnosis not present

## 2022-04-19 DIAGNOSIS — Z87442 Personal history of urinary calculi: Secondary | ICD-10-CM | POA: Diagnosis not present

## 2022-04-19 DIAGNOSIS — N3289 Other specified disorders of bladder: Secondary | ICD-10-CM | POA: Diagnosis not present

## 2022-04-19 DIAGNOSIS — N3000 Acute cystitis without hematuria: Secondary | ICD-10-CM | POA: Diagnosis not present

## 2022-04-19 DIAGNOSIS — K5730 Diverticulosis of large intestine without perforation or abscess without bleeding: Secondary | ICD-10-CM | POA: Diagnosis not present

## 2022-04-29 ENCOUNTER — Emergency Department (HOSPITAL_COMMUNITY)
Admission: EM | Admit: 2022-04-29 | Discharge: 2022-04-30 | Disposition: A | Discharge status: Home / Self Care | Payer: Medicare Other | Attending: Emergency Medicine | Admitting: Emergency Medicine

## 2022-04-29 ENCOUNTER — Emergency Department (HOSPITAL_COMMUNITY): Payer: Medicare Other

## 2022-04-29 ENCOUNTER — Encounter (HOSPITAL_COMMUNITY): Payer: Self-pay

## 2022-04-29 DIAGNOSIS — R300 Dysuria: Secondary | ICD-10-CM | POA: Diagnosis present

## 2022-04-29 DIAGNOSIS — N3000 Acute cystitis without hematuria: Secondary | ICD-10-CM | POA: Diagnosis not present

## 2022-04-29 DIAGNOSIS — Z8546 Personal history of malignant neoplasm of prostate: Secondary | ICD-10-CM | POA: Insufficient documentation

## 2022-04-29 DIAGNOSIS — R0602 Shortness of breath: Secondary | ICD-10-CM | POA: Diagnosis not present

## 2022-04-29 DIAGNOSIS — Z743 Need for continuous supervision: Secondary | ICD-10-CM | POA: Diagnosis not present

## 2022-04-29 DIAGNOSIS — I10 Essential (primary) hypertension: Secondary | ICD-10-CM | POA: Diagnosis not present

## 2022-04-29 DIAGNOSIS — N3090 Cystitis, unspecified without hematuria: Secondary | ICD-10-CM | POA: Diagnosis not present

## 2022-04-29 DIAGNOSIS — K5730 Diverticulosis of large intestine without perforation or abscess without bleeding: Secondary | ICD-10-CM | POA: Diagnosis not present

## 2022-04-29 DIAGNOSIS — M79606 Pain in leg, unspecified: Secondary | ICD-10-CM | POA: Diagnosis not present

## 2022-04-29 DIAGNOSIS — N281 Cyst of kidney, acquired: Secondary | ICD-10-CM | POA: Diagnosis not present

## 2022-04-29 LAB — BLOOD GAS, VENOUS
Acid-Base Excess: 3.9 mmol/L — ABNORMAL HIGH (ref 0.0–2.0)
Bicarbonate: 28.5 mmol/L — ABNORMAL HIGH (ref 20.0–28.0)
O2 Saturation: 72.3 %
Patient temperature: 37.1
pCO2, Ven: 42 mmHg — ABNORMAL LOW (ref 44–60)
pH, Ven: 7.44 — ABNORMAL HIGH (ref 7.25–7.43)
pO2, Ven: 44 mmHg (ref 32–45)

## 2022-04-29 LAB — URINALYSIS, ROUTINE W REFLEX MICROSCOPIC
Bacteria, UA: NONE SEEN
Bilirubin Urine: NEGATIVE
Glucose, UA: 150 mg/dL — AB
Hgb urine dipstick: NEGATIVE
Ketones, ur: 5 mg/dL — AB
Nitrite: NEGATIVE
Protein, ur: 100 mg/dL — AB
Specific Gravity, Urine: 1.036 — ABNORMAL HIGH (ref 1.005–1.030)
WBC, UA: >50 WBC/hpf — ABNORMAL HIGH (ref 0–5)
pH: 6 (ref 5.0–8.0)

## 2022-04-29 LAB — LACTIC ACID, PLASMA: Lactic Acid, Venous: 1.3 mmol/L (ref 0.5–1.9)

## 2022-04-29 LAB — COMPREHENSIVE METABOLIC PANEL
ALT: 10 U/L (ref 0–44)
AST: 13 U/L — ABNORMAL LOW (ref 15–41)
Albumin: 3.4 g/dL — ABNORMAL LOW (ref 3.5–5.0)
Alkaline Phosphatase: 61 U/L (ref 38–126)
Anion gap: 11 (ref 5–15)
BUN: 14 mg/dL (ref 8–23)
CO2: 22 mmol/L (ref 22–32)
Calcium: 9 mg/dL (ref 8.9–10.3)
Chloride: 103 mmol/L (ref 98–111)
Creatinine, Ser: 1.28 mg/dL — ABNORMAL HIGH (ref 0.61–1.24)
GFR, Estimated: >60 mL/min (ref >60)
Glucose, Bld: 222 mg/dL — ABNORMAL HIGH (ref 70–99)
Potassium: 3.9 mmol/L (ref 3.5–5.1)
Sodium: 136 mmol/L (ref 135–145)
Total Bilirubin: 0.7 mg/dL (ref 0.3–1.2)
Total Protein: 8.6 g/dL — ABNORMAL HIGH (ref 6.5–8.1)

## 2022-04-29 LAB — CBC WITH DIFFERENTIAL/PLATELET
Abs Immature Granulocytes: 0.08 10*3/uL — ABNORMAL HIGH (ref 0.00–0.07)
Basophils Absolute: 0 10*3/uL (ref 0.0–0.1)
Basophils Relative: 0 %
Eosinophils Absolute: 0.1 10*3/uL (ref 0.0–0.5)
Eosinophils Relative: 1 %
HCT: 39.5 % (ref 39.0–52.0)
Hemoglobin: 12.2 g/dL — ABNORMAL LOW (ref 13.0–17.0)
Immature Granulocytes: 1 %
Lymphocytes Relative: 7 %
Lymphs Abs: 1.1 10*3/uL (ref 0.7–4.0)
MCH: 23.7 pg — ABNORMAL LOW (ref 26.0–34.0)
MCHC: 30.9 g/dL (ref 30.0–36.0)
MCV: 76.8 fL — ABNORMAL LOW (ref 80.0–100.0)
Monocytes Absolute: 1.2 10*3/uL — ABNORMAL HIGH (ref 0.1–1.0)
Monocytes Relative: 7 %
Neutro Abs: 13.7 10*3/uL — ABNORMAL HIGH (ref 1.7–7.7)
Neutrophils Relative %: 84 %
Platelets: 293 10*3/uL (ref 150–400)
RBC: 5.14 MIL/uL (ref 4.22–5.81)
RDW: 14.9 % (ref 11.5–15.5)
WBC: 16.2 10*3/uL — ABNORMAL HIGH (ref 4.0–10.5)
nRBC: 0 % (ref 0.0–0.2)

## 2022-04-29 LAB — LIPASE, BLOOD: Lipase: 29 U/L (ref 11–51)

## 2022-04-29 MED ORDER — FENTANYL CITRATE PF 50 MCG/ML IJ SOSY
25.0000 ug | PREFILLED_SYRINGE | Freq: Once | INTRAMUSCULAR | Status: AC
  Administered 2022-04-29: 25 ug via INTRAVENOUS
  Filled 2022-04-29: qty 1

## 2022-04-29 MED ORDER — SODIUM CHLORIDE 0.9 % IV SOLN
1.0000 g | Freq: Once | INTRAVENOUS | Status: AC
  Administered 2022-04-29: 1 g via INTRAVENOUS
  Filled 2022-04-29: qty 10

## 2022-04-29 MED ORDER — SODIUM CHLORIDE 0.9 % IV BOLUS
1000.0000 mL | Freq: Once | INTRAVENOUS | Status: AC
  Administered 2022-04-29: 1000 mL via INTRAVENOUS

## 2022-04-29 MED ORDER — IOHEXOL 350 MG/ML SOLN
100.0000 mL | Freq: Once | INTRAVENOUS | Status: AC | PRN
  Administered 2022-04-29: 100 mL via INTRAVENOUS

## 2022-04-29 MED ORDER — CEPHALEXIN 500 MG PO CAPS: 500.0000 mg | ORAL_CAPSULE | Freq: Three times a day (TID) | ORAL | 0 refills | Status: DC

## 2022-04-29 NOTE — ED Provider Triage Note (Signed)
Emergency Medicine Provider Triage Evaluation Note  Jacob [name] , a 66 y.o. male  was evaluated in triage.  Pt complains of pelvic pain, onset 3 weeks ago, worsening, radiates up right and left sides abdomen. Legs feel numb through inner thighs, no testicular numbness. No loss of bowel or bladder control. Pain worse with walking. No leg weakness. Prostate removed 2 months ago.  Review of Systems  Positive: As above Negative: As above  Physical Exam  BP 94/84   Pulse 90   Temp 99.8 F (37.7 C) (Oral)   Resp 18   SpO2 95%  Gen:   Awake, no distress   Resp:  Normal effort  MSK:   Moves extremities without difficulty  Other:  DP pulses present  Medical Decision Making  Medically screening exam initiated at 10:16 AM.  Appropriate orders placed.  Jacob [name] was informed that the remainder of the evaluation will be completed by another provider, this initial triage assessment does not replace that evaluation, and the importance of remaining in the ED until their evaluation is complete.     Tacy Learn, PA-C 04/29/22 1018

## 2022-04-29 NOTE — ED Triage Notes (Signed)
Pt arrived via [facility], from home c/o lower abd pain and groin pain x2 wks. Recent prostate surgery. CT done by PCP when pain started, no acute finding.

## 2022-04-29 NOTE — ED Provider Notes (Signed)
Jacob [name] DEPT Provider Note   CSN: [id number] Arrival date & time: 04/29/22  1700     History  Chief Complaint  Patient presents with   Abdominal Pain   Leg Pain   Groin Pain    Jacob [name] is a 66 y.o. male history of prostate cancer status post prostatectomy, here presenting with dysuria and groin pain.  Patient has lower abdominal pain for the last week or so.  Patient went to see his urologist and had a CT abdomen pelvis that showed possible cystitis.  Patient states that he was not given antibiotics at that time.  Of note, patient had radical prostatectomy in the PET scan that showed possible femur involvement but that was thought to be artifact.  Patient states that he has persistent pain and urinary frequency.  Patient was noted to be hypotensive in triage.  The history is provided by the patient.       Home Medications Prior to Admission medications   Medication Sig Start Date End Date Taking? Authorizing Provider  acetaminophen (TYLENOL) 500 MG tablet Take 500 mg by mouth daily as needed.    [provider]  amLODipine (NORVASC) 10 MG tablet TAKE 1 TABLET BY MOUTH EVERY DAY. This will be your last refill until you are seen in the office to discuss blood pressure. Patient taking differently: Take 10 mg by mouth daily. 01/25/22   [facility], Tami Lin, PA  docusate sodium (COLACE) 100 MG capsule Take 1 capsule (100 mg total) by mouth 2 (two) times daily. 02/14/22   Debbrah Alar, PA-C  icosapent Ethyl (VASCEPA) 1 g capsule Take 2 capsules (2 g total) by mouth 2 (two) times daily. 01/25/22   [facility], Tami Lin, PA  metFORMIN (GLUCOPHAGE) 1000 MG tablet TAKE 1 TABLET (1,000 MG TOTAL) BY MOUTH 2 (TWO) TIMES DAILY WITH A MEAL. 08/24/21   Alcus Dad, MD  rosuvastatin (CRESTOR) 40 MG tablet Take 1 tablet (40 mg total) by mouth daily. 01/25/22   [facility], Tami Lin, PA  sulfamethoxazole-trimethoprim (BACTRIM DS) 800-160 MG tablet Take 1  tablet by mouth 2 (two) times daily. Start the day prior to foley removal appointment 02/14/22   Debbrah Alar, PA-C  traZODone (DESYREL) 50 MG tablet Take 1 tablet (50 mg total) by mouth at bedtime. 04/20/19   Blanchie Dessert, MD  mirtazapine (REMERON) 15 MG tablet Take 1 tablet (15 mg total) by mouth at bedtime. 03/19/19 04/20/19  Nuala Alpha, MD      Allergies    Methylprednisolone sodium succ and Ms contin [morphine]    Review of Systems   Review of Systems  Gastrointestinal:  Positive for abdominal pain.  Genitourinary:  Positive for dysuria and frequency.    Physical Exam Updated Vital Signs BP 94/84   Pulse 90   Temp 99.8 F (37.7 C) (Oral)   Resp 18   SpO2 95%  Physical Exam Vitals and nursing note reviewed.  HENT:     Head: Normocephalic.     Mouth/Throat:     Mouth: Mucous membranes are moist.  Eyes:     Extraocular Movements: Extraocular movements intact.     Pupils: Pupils are equal, round, and reactive to light.  Cardiovascular:     Rate and Rhythm: Normal rate and regular rhythm.  Abdominal:     General: Abdomen is flat.     Comments: Suprapubic tenderness  Skin:    General: Skin is warm.     Capillary Refill: Capillary refill takes less than  2 seconds.  Neurological:     General: No focal deficit present.     Mental Status: [name] alert and oriented to person, place, and time.  Psychiatric:        Mood and Affect: Mood normal.        Behavior: Behavior normal.     ED Results / Procedures / Treatments   Labs (all labs ordered are listed, but only abnormal results are displayed) Labs Reviewed  CBC WITH DIFFERENTIAL/PLATELET - Abnormal; Notable for the following components:      Result Value   WBC 16.2 (*)    Hemoglobin 12.2 (*)    MCV 76.8 (*)    MCH 23.7 (*)    Neutro Abs 13.7 (*)    Monocytes Absolute 1.2 (*)    Abs Immature Granulocytes 0.08 (*)    All other components within normal limits  COMPREHENSIVE METABOLIC PANEL - Abnormal;  Notable for the following components:   Glucose, Bld 222 (*)    Creatinine, Ser 1.28 (*)    Total Protein 8.6 (*)    Albumin 3.4 (*)    AST 13 (*)    All other components within normal limits  URINE CULTURE  CULTURE, BLOOD (ROUTINE X 2)  CULTURE, BLOOD (ROUTINE X 2)  LIPASE, BLOOD  URINALYSIS, ROUTINE W REFLEX MICROSCOPIC  LACTIC ACID, PLASMA  LACTIC ACID, PLASMA    EKG None  Radiology No results found.  Procedures Procedures    Medications Ordered in ED Medications  sodium chloride 0.9 % bolus 1,000 mL (has no administration in time range)  fentaNYL (SUBLIMAZE) injection 25 mcg (has no administration in time range)    ED Course/ Medical Decision Making/ A&P                           Medical Decision Making Jacob [name] is a 67 y.o. male here with dysuria, suprapubic pain.  Concern for possible cystitis versus pyelonephritis.  Patient had a recent CT abdomen pelvis that showed cystitis but did not receive any antibiotics.  Patient is hypotensive and has low-grade temperature.  Plan to get CBC and CMP and UA and lactate and cultures and CT abdomen pelvis.  Will hydrate patient  10:55 PM WBC is 16. Cr is 1.3.  UA showed > 50 WBC.  CT showed no obvious pyelonephritis.  I noticed that patient's oxygen level was borderline around 90%.  He has some subjective shortness of breath so CTA was performed and showed no PE or pneumonia.  Patient states that he was told that he may have sleep apnea and has borderline oxygen saturation.  Patient was given Rocephin in the ED.  Patient is stable for discharge home.  Problems Addressed: Cystitis: acute illness or injury  Amount and/or Complexity of Data Reviewed Labs: ordered. Decision-making details documented in ED Course. Radiology: ordered and independent interpretation performed. Decision-making details documented in ED Course. ECG/medicine tests: ordered.  Risk Prescription drug management.    Final Clinical Impression(s)  / ED Diagnoses Final diagnoses:  None    Rx / DC Orders ED Discharge Orders     None         Drenda Freeze, MD 04/29/22 [phone]

## 2022-04-29 NOTE — Discharge Instructions (Signed)
Take Keflex 500 mg 3 times a day for a week for your urinary tract infection.  Take Tylenol or Motrin for pain  Please follow-up with your doctor and especially with your urologist  Return to ER if you have worse abdominal pain or vomiting or fever

## 2022-04-29 NOTE — ED Notes (Signed)
Dr green sent to lab

## 2022-05-01 LAB — URINE CULTURE: Culture: 10000 — AB

## 2022-05-02 ENCOUNTER — Telehealth (HOSPITAL_BASED_OUTPATIENT_CLINIC_OR_DEPARTMENT_OTHER): Payer: Self-pay | Admitting: Emergency Medicine

## 2022-05-02 NOTE — Progress Notes (Signed)
ED Antimicrobial Stewardship Positive Culture Follow Up   Jacob [name] is an 67 y.o. male who presented to [facility] on 04/29/2022 with a chief complaint of  Chief Complaint  Patient presents with   Abdominal Pain   Leg Pain   Groin Pain    Recent Results (from the past 720 hour(s))  Blood culture (routine x 2)     Status: None (Preliminary result)   Collection Time: 04/29/22  6:49 PM   Specimen: BLOOD  Result Value Ref Range Status   Specimen Description   Final    BLOOD BLOOD LEFT HAND Performed at [facility], [place] [address], [place], [place] [address]    Special Requests   Final    BOTTLES DRAWN AEROBIC AND ANAEROBIC Blood Culture adequate volume Performed at [facility] [address] [place], [place] [address]    Culture   Final    NO GROWTH 3 DAYS Performed at [facility], [place] [address], [place], [place] [address]    Report Status PENDING  Incomplete  Blood culture (routine x 2)     Status: None (Preliminary result)   Collection Time: 04/29/22  8:04 PM   Specimen: BLOOD  Result Value Ref Range Status   Specimen Description   Final    BLOOD LEFT ANTECUBITAL Performed at [facility] [address], [place], [place] [address]    Special Requests   Final    BOTTLES DRAWN AEROBIC AND ANAEROBIC Blood Culture results may not be optimal due to an excessive volume of blood received in culture bottles Performed at [facility] [address], [place], [place] [address]    Culture   Final    NO GROWTH 3 DAYS Performed at [facility], [place] [address], [place], [place] [address]    Report Status PENDING  Incomplete  Urine Culture     Status: Abnormal   Collection Time: 04/29/22  8:08 PM   Specimen: Urine, Clean Catch  Result Value Ref Range Status   Specimen Description   Final    URINE, CLEAN CATCH Performed at [facility], [place] [address],  [place], [place] [address]    Special Requests   Final    NONE Performed at [facility], [place] [address] [place], [place] [address]    Culture 10,000 COLONIES/mL STAPHYLOCOCCUS HOMINIS (A)  Final   Report Status 05/01/2022 FINAL  Final   Organism ID, Bacteria STAPHYLOCOCCUS HOMINIS (A)  Final      Susceptibility   Staphylococcus hominis - MIC*    CIPROFLOXACIN >=8 RESISTANT Resistant     GENTAMICIN 1 SENSITIVE Sensitive     NITROFURANTOIN <=16 SENSITIVE Sensitive     OXACILLIN >=4 RESISTANT Resistant     TETRACYCLINE <=1 SENSITIVE Sensitive     VANCOMYCIN <=0.5 SENSITIVE Sensitive     TRIMETH/SULFA 40 SENSITIVE Sensitive     CLINDAMYCIN <=0.25 SENSITIVE Sensitive     RIFAMPIN <=0.5 SENSITIVE Sensitive     Inducible Clindamycin NEGATIVE Sensitive     * 10,000 COLONIES/mL STAPHYLOCOCCUS HOMINIS    '[x]'$  Treated with cephalexin, organism resistant to prescribed antimicrobial '[]'$  Patient discharged originally without antimicrobial agent and treatment is now indicated  New antibiotic prescription:  - Low colony yield but would prefer symptom check ( dysuria, fever, suprapubic pain)  - Stop cephalexin   ED Provider: Sherrill Raring, [facility]   Royetta Asal, PharmD, BCPS 05/02/2022 2:16 PM

## 2022-05-02 NOTE — Telephone Encounter (Signed)
Post ED Visit - Positive Culture Follow-up: Successful Patient Follow-Up  Culture assessed and recommendations reviewed by:  '[]'$  Elenor Quinones, Pharm.D. '[]'$  Heide Guile, Pharm.D., BCPS AQ-ID '[]'$  Parks Neptune, Pharm.D., BCPS '[]'$  Alycia Rossetti, Pharm.D., BCPS '[]'$  [place], Pharm.D., BCPS, AAHIVP '[]'$  Legrand Como, Pharm.D., BCPS, AAHIVP '[]'$  Salome Arnt, PharmD, BCPS '[]'$  Johnnette Gourd, PharmD, BCPS '[]'$  Hughes Better, PharmD, BCPS '[]'$  Leeroy Cha, PharmD  Positive urine culture  '[]'$  Patient discharged without antimicrobial prescription and treatment is now indicated '[x]'$  Organism is resistant to prescribed ED discharge antimicrobial '[]'$  Patient with positive blood cultures  Changes discussed with ED provider: Sherrill Raring [facility] New antibiotic prescription: low colony count, stop cephalexin  Attempting to contact patient   Jacob [name] 05/02/2022, 2:37 PM

## 2022-05-04 LAB — CULTURE, BLOOD (ROUTINE X 2)
Culture: NO GROWTH
Culture: NO GROWTH
Special Requests: ADEQUATE

## 2022-05-13 ENCOUNTER — Ambulatory Visit: Payer: Medicare Other | Admitting: Cardiovascular Disease

## 2022-08-05 ENCOUNTER — Other Ambulatory Visit (HOSPITAL_COMMUNITY): Payer: Self-pay | Admitting: Urology

## 2022-08-05 DIAGNOSIS — C61 Malignant neoplasm of prostate: Secondary | ICD-10-CM

## 2022-09-01 ENCOUNTER — Ambulatory Visit (HOSPITAL_COMMUNITY)
Admission: RE | Admit: 2022-09-01 | Discharge: 2022-09-01 | Disposition: A | Discharge status: Home / Self Care | Payer: 59 | Source: Ambulatory Visit | Attending: Urology | Admitting: Urology

## 2022-09-01 DIAGNOSIS — C61 Malignant neoplasm of prostate: Secondary | ICD-10-CM | POA: Diagnosis present

## 2022-09-01 MED ORDER — PIFLIFOLASTAT F 18 (PYLARIFY) INJECTION
9.0000 | Freq: Once | INTRAVENOUS | Status: AC
  Administered 2022-09-01: 9.42 via INTRAVENOUS

## 2022-09-29 DIAGNOSIS — Z5111 Encounter for antineoplastic chemotherapy: Secondary | ICD-10-CM | POA: Diagnosis not present

## 2022-10-02 ENCOUNTER — Emergency Department (HOSPITAL_COMMUNITY)
Admission: EM | Admit: 2022-10-02 | Discharge: 2022-10-03 | Disposition: A | Discharge status: Home / Self Care | Payer: 59 | Attending: Emergency Medicine | Admitting: Emergency Medicine

## 2022-10-02 ENCOUNTER — Other Ambulatory Visit: Payer: Self-pay

## 2022-10-02 ENCOUNTER — Emergency Department (HOSPITAL_COMMUNITY): Payer: 59

## 2022-10-02 DIAGNOSIS — R079 Chest pain, unspecified: Secondary | ICD-10-CM | POA: Diagnosis not present

## 2022-10-02 DIAGNOSIS — R791 Abnormal coagulation profile: Secondary | ICD-10-CM | POA: Insufficient documentation

## 2022-10-02 DIAGNOSIS — I10 Essential (primary) hypertension: Secondary | ICD-10-CM | POA: Diagnosis not present

## 2022-10-02 DIAGNOSIS — E119 Type 2 diabetes mellitus without complications: Secondary | ICD-10-CM | POA: Insufficient documentation

## 2022-10-02 DIAGNOSIS — R0602 Shortness of breath: Secondary | ICD-10-CM

## 2022-10-02 DIAGNOSIS — F411 Generalized anxiety disorder: Secondary | ICD-10-CM | POA: Diagnosis present

## 2022-10-02 DIAGNOSIS — G5603 Carpal tunnel syndrome, bilateral upper limbs: Secondary | ICD-10-CM | POA: Diagnosis not present

## 2022-10-02 DIAGNOSIS — F330 Major depressive disorder, recurrent, mild: Secondary | ICD-10-CM | POA: Diagnosis present

## 2022-10-02 DIAGNOSIS — R509 Fever, unspecified: Secondary | ICD-10-CM | POA: Insufficient documentation

## 2022-10-02 DIAGNOSIS — F32A Depression, unspecified: Secondary | ICD-10-CM

## 2022-10-02 DIAGNOSIS — I7121 Aneurysm of the ascending aorta, without rupture: Secondary | ICD-10-CM | POA: Diagnosis not present

## 2022-10-02 DIAGNOSIS — J9811 Atelectasis: Secondary | ICD-10-CM | POA: Diagnosis not present

## 2022-10-02 DIAGNOSIS — Z8546 Personal history of malignant neoplasm of prostate: Secondary | ICD-10-CM | POA: Diagnosis not present

## 2022-10-02 DIAGNOSIS — R059 Cough, unspecified: Secondary | ICD-10-CM | POA: Diagnosis not present

## 2022-10-02 LAB — CBC
HCT: 43.3 % (ref 39.0–52.0)
Hemoglobin: 14.7 g/dL (ref 13.0–17.0)
MCH: 27.3 pg (ref 26.0–34.0)
MCHC: 33.9 g/dL (ref 30.0–36.0)
MCV: 80.5 fL (ref 80.0–100.0)
Platelets: 184 10*3/uL (ref 150–400)
RBC: 5.38 MIL/uL (ref 4.22–5.81)
RDW: 14 % (ref 11.5–15.5)
WBC: 10.7 10*3/uL — ABNORMAL HIGH (ref 4.0–10.5)
nRBC: 0 % (ref 0.0–0.2)

## 2022-10-02 LAB — BASIC METABOLIC PANEL
Anion gap: 10 (ref 5–15)
BUN: 11 mg/dL (ref 8–23)
CO2: 24 mmol/L (ref 22–32)
Calcium: 9 mg/dL (ref 8.9–10.3)
Chloride: 101 mmol/L (ref 98–111)
Creatinine, Ser: 0.96 mg/dL (ref 0.61–1.24)
GFR, Estimated: >60 mL/min (ref >60)
Glucose, Bld: 200 mg/dL — ABNORMAL HIGH (ref 70–99)
Potassium: 3.9 mmol/L (ref 3.5–5.1)
Sodium: 135 mmol/L (ref 135–145)

## 2022-10-02 LAB — TROPONIN I (HIGH SENSITIVITY)
Troponin I (High Sensitivity): 8 ng/L (ref <18)
Troponin I (High Sensitivity): 8 ng/L (ref <18)

## 2022-10-02 LAB — D-DIMER, QUANTITATIVE: D-Dimer, Quant: 0.65 ug/mL-FEU — ABNORMAL HIGH (ref 0.00–0.50)

## 2022-10-02 LAB — BRAIN NATRIURETIC PEPTIDE: B Natriuretic Peptide: 53 pg/mL (ref 0.0–100.0)

## 2022-10-02 MED ORDER — IOHEXOL 350 MG/ML SOLN
75.0000 mL | Freq: Once | INTRAVENOUS | Status: AC | PRN
  Administered 2022-10-02: 75 mL via INTRAVENOUS

## 2022-10-02 NOTE — ED Provider Notes (Signed)
[facility]-EMERGENCY DEPT [facility] Emergency Department Provider Note MRN:  [id number]  Arrival date & time: 10/03/22     Chief Complaint   Shortness of Breath and Numbness   History of Present Illness   Jacob [name] is a 67 y.o. year-old male presents to the ED with chief complaint of SOB and arm numbness.  States that he has been SOB for about 2 months.  States that when he is falling asleep he feels like he is dying because he isn't getting "oxygen."   Reports having numbness in his hands and wrists for the past 3-4 days.  States that symptoms are mostly located around his wrists.  They are worsened with movement.  States that he has felt subjective fever, but didn't measure a temp.  History provided by patient.   Review of Systems  Pertinent positive and negative review of systems noted in HPI.    Physical Exam   Vitals:   10/02/22 2352 10/03/22 0000  BP: (!) 170/102 (!) 166/103  Pulse: 86 92  Resp:    Temp:    SpO2: 91% 91%    CONSTITUTIONAL:  non toxic-appearing, NAD NEURO:  Alert and oriented x 3, CN 3-12 grossly intact EYES:  eyes equal and reactive ENT/NECK:  Supple, no stridor  CARDIO:  tachycardic, regular rhythm, appears well-perfused  PULM:  No respiratory distress, CTAB GI/GU:  non-distended, no focal tenderness, ventral hernia MSK/SPINE:  No gross deformities, no edema, moves all extremities  SKIN:  no rash, atraumatic   *Additional and/or pertinent findings included in MDM below  Diagnostic and Interventional Summary    EKG Interpretation  Date/Time:  Sunday October 02 2022 22:41:26 EDT Ventricular Rate:  85 PR Interval:  160 QRS Duration: 97 QT Interval:  364 QTC Calculation: 433 R Axis:   28 Text Interpretation: Sinus rhythm Atrial premature complexes Abnormal R-wave progression, early transition Borderline repolarization abnormality Confirmed by Zadie Rhine ([id number]) on 10/02/2022 11:39:48 PM       Labs Reviewed  BASIC METABOLIC  PANEL - Abnormal; Notable for the following components:      Result Value   Glucose, Bld 200 (*)    All other components within normal limits  CBC - Abnormal; Notable for the following components:   WBC 10.7 (*)    All other components within normal limits  D-DIMER, QUANTITATIVE - Abnormal; Notable for the following components:   D-Dimer, Quant 0.65 (*)    All other components within normal limits  BRAIN NATRIURETIC PEPTIDE  TROPONIN I (HIGH SENSITIVITY)  TROPONIN I (HIGH SENSITIVITY)    CT Angio Chest PE W and/or Wo Contrast  Final Result    DG Chest 2 View  Final Result      Medications  acetaminophen (TYLENOL) tablet 500 mg (has no administration in time range)  amLODipine (NORVASC) tablet 10 mg (has no administration in time range)  icosapent Ethyl (VASCEPA) 1 g capsule 2 g (has no administration in time range)  metFORMIN (GLUCOPHAGE) tablet 1,000 mg (has no administration in time range)  rosuvastatin (CRESTOR) tablet 40 mg (has no administration in time range)  traZODone (DESYREL) tablet 50 mg (has no administration in time range)  LORazepam (ATIVAN) tablet 1 mg (has no administration in time range)  iohexol (OMNIPAQUE) 350 MG/ML injection 75 mL (75 mLs Intravenous Contrast Given 10/02/22 2340)     Procedures  /  Critical Care Procedures  ED Course and Medical Decision Making  I have reviewed the triage vital signs, the nursing notes,  and pertinent available records from the EMR.  Social Determinants Affecting Complexity of Care: Patient has no clinically significant social determinants affecting this chief complaint..   ED Course:    Medical Decision Making Patient here with several complaints.  1.  Shortness of breath: Onset has been several weeks ago.  He denies productive cough or fever.  He does have history of sleep apnea, but does not use a CPAP.  He states that his symptoms cause him to wake up gasping for air.  This is the shortness of breath that he is  describing.  It sounds most consistent with sleep apnea.  I think that he would benefit from CPAP.  Will consult TOC to see if they can assist with this in any way.  His troponins are negative.  He did have mildly elevated D-dimer, which when age-adjusted is normal, but I did proceed with a CT scan.  CT showed no evidence of pneumonia or pulmonary embolism.  It was notable for an incidental thoracic aortic aneurysm which will require annual monitoring.  I discussed this with the patient and have advised him to follow-up with his doctor.  2.  Wrist pain: Patient has bilateral wrist pain/numbness and tingling.  His symptoms seem most consistent with carpal tunnel syndrome.  The pain is reproducible.  There is no evidence of trauma.  Recommend that patient follow-up with orthopedics for this.  3.  Depression, patient reports feelings of loneliness, hopelessness and depression.  His symptoms seem quite severe.  [name] very emotional at the bedside.  Will consult TTS for evaluation.  TTS recommends inpatient [facility] psych.  Patient is agreeable with this plan.  I will restart his home meds and give him something to help him calm down tonight.  Amount and/or Complexity of Data Reviewed Labs: ordered. Radiology: ordered. ECG/medicine tests: ordered.  Risk OTC drugs. Prescription drug management.     Consultants: TTS consult, recommends inpatient [facility] psych.   Treatment and Plan: Patient's exam and diagnostic results are concerning for needing psychiatric admission.  Feel that patient will need admission to the hospital for further treatment and evaluation.    Final Clinical Impressions(s) / ED Diagnoses     ICD-10-CM   1. SOB (shortness of breath)  R06.02     2. Bilateral carpal tunnel syndrome  G56.03     3. Depression, unspecified depression type  F32.A     4. Aneurysm of ascending aorta without rupture [facility])  I71.21       ED Discharge Orders     None         Discharge  Instructions Discussed with and Provided to Patient:     Discharge Instructions      I will have our social worker reach out to you about getting a CPAP, which will help with your breathing at night.  You need to follow-up with your regular doctor to have annual imaging of your aneurysm.        Roxy Horseman, PA-C 10/03/22 1610    Zadie Rhine, MD 10/03/22 [phone]

## 2022-10-02 NOTE — ED Triage Notes (Signed)
Pt arrives c/o SOB x3 months, worse when he lays down. Pt also states that he has a dry cough at night. States that he also developed R arm and L wrist numbness today, which made him come in for evaluation. States mild chest pain - rates it 1-2/10. Denies cardiac hx.  Hx of prostate CA - states had a procedure last week to remove the cancer. States that he is having some swelling above the umbilicus since procedure.

## 2022-10-03 ENCOUNTER — Encounter (HOSPITAL_COMMUNITY): Payer: Self-pay | Admitting: Emergency Medicine

## 2022-10-03 DIAGNOSIS — F330 Major depressive disorder, recurrent, mild: Secondary | ICD-10-CM

## 2022-10-03 MED ORDER — ROSUVASTATIN CALCIUM 20 MG PO TABS: 40.0000 mg | ORAL_TABLET | Freq: Every day | ORAL | Status: DC

## 2022-10-03 MED ORDER — METFORMIN HCL 500 MG PO TABS
1000.0000 mg | ORAL_TABLET | Freq: Two times a day (BID) | ORAL | Status: DC
  Administered 2022-10-03: 1000 mg via ORAL
  Filled 2022-10-03: qty 2

## 2022-10-03 MED ORDER — ICOSAPENT ETHYL 1 G PO CAPS
2.0000 g | ORAL_CAPSULE | Freq: Two times a day (BID) | ORAL | Status: DC
  Filled 2022-10-03: qty 2

## 2022-10-03 MED ORDER — LORAZEPAM 1 MG PO TABS
1.0000 mg | ORAL_TABLET | Freq: Once | ORAL | Status: AC
  Administered 2022-10-03: 1 mg via ORAL
  Filled 2022-10-03: qty 1

## 2022-10-03 MED ORDER — ACETAMINOPHEN 500 MG PO TABS
500.0000 mg | ORAL_TABLET | Freq: Every day | ORAL | Status: DC | PRN
  Administered 2022-10-03: 500 mg via ORAL
  Filled 2022-10-03: qty 1

## 2022-10-03 MED ORDER — AMLODIPINE BESYLATE 5 MG PO TABS
10.0000 mg | ORAL_TABLET | Freq: Every day | ORAL | Status: DC
  Administered 2022-10-03: 10 mg via ORAL
  Filled 2022-10-03: qty 2

## 2022-10-03 MED ORDER — TRAZODONE HCL 50 MG PO TABS
50.0000 mg | ORAL_TABLET | Freq: Every day | ORAL | Status: DC
  Administered 2022-10-03: 50 mg via ORAL
  Filled 2022-10-03: qty 1

## 2022-10-03 NOTE — ED Notes (Signed)
Pt requesting to leave. PA made aware. 

## 2022-10-03 NOTE — ED Notes (Signed)
Sleeping, calm, NAD, per report pending gero-psych placement versus d/c if has ride.

## 2022-10-03 NOTE — ED Provider Notes (Addendum)
Patient requesting discharge and outpatient follow-up.  [name] here voluntarily. I don't think he is a threat to himself or others.  I messaged [place] with TTS, who agrees that there are no immediate safety concerns.  Will discharge per patient's request.  1:58 AM Changed his mind and wants to stay.   Roxy Horseman, PA-C 10/03/22 0138    Roxy Horseman, PA-C 10/03/22 Ferdinand Lango    Zadie Rhine, MD 10/03/22 [phone]

## 2022-10-03 NOTE — Discharge Instructions (Addendum)
I will have our social worker reach out to you about getting a CPAP, which will help with your breathing at night.  You need to follow-up with your regular doctor to have annual imaging of your aneurysm.    Safety Plan Jacob [name] will reach out to his sister Jacob [name], call 911 or call mobile crisis, or go to nearest emergency room if condition worsens or if suicidal thoughts become active Patients' will follow up with behavioral health urgent care open access for outpatient psychiatric services (therapy/medication management).  The suicide prevention education provided includes the following: Suicide risk factors Suicide prevention and interventions National Suicide Hotline telephone number [facility] assessment telephone number [facility] Emergency Assistance 911 [facility] and/or Residential Mobile Crisis Unit telephone number Request made of family/significant other to:  sister Jacob [name] Remove weapons (e.g., guns, rifles, knives), all items previously/currently identified as safety concern.   Remove drugs/medications (over the counter, prescriptions, illicit drugs), all items previously/currently identified as a safety concern.
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
## 2022-10-03 NOTE — ED Notes (Signed)
Pt has been dressed out into [place] scrubs. Pt belonging has been placed in the 9-12 cabinet. Security has wanded pt.

## 2022-10-03 NOTE — ED Notes (Signed)
Pt belongings given to pt.  Cell phone and keys and shirt,pants,socks and shoes

## 2022-10-03 NOTE — ED Notes (Signed)
Pt and NP on phone with pts sister about staying or leaving

## 2022-10-03 NOTE — ED Notes (Signed)
Bagged and labeled belongings placed in locker 29. Pt ambulatory to room 29 in TCU. Steady gait to b/r. Pt updated. Report given. Pt asking about ride home. Inpt to gero-psych versus d/c. Pending definitive disposition.

## 2022-10-03 NOTE — ED Notes (Signed)
Pt decided to stay to receive treatment. PA aware.

## 2022-10-03 NOTE — Consult Note (Signed)
BH ED ASSESSMENT   Reason for Consult:  depression Referring Physician:  Roxy Horseman, PA-C Patient Identification: Jacob [name] MRN:  [id number] ED Chief Complaint: MDD (major depressive disorder), recurrent episode, mild (HCC)  Diagnosis:  Principal Problem:   MDD (major depressive disorder), recurrent episode, mild (HCC) Active Problems:   Anxiety state   ED Assessment Time Calculation: Start Time: 0930 Stop Time: 0950 Total Time in Minutes (Assessment Completion): 20    Subjective:  Patient is a 67 year old male with a history of anxiety, depression, hyperlipidemia, hypertension, diabetes, atypical chest pain who is presenting today with worsening anxiety and depression.    Jacob [name], 67 y.o., male patient seen face to face by this provider, consulted with Dr. Lucianne Muss; and chart reviewed on 10/03/22.  On evaluation Jacob [name] reports he came to the hospital due to feeling like he was short of breath, saying he was having a heart attack.  States that he lives alone With His 3 cats and at times he becomes very lonely.  Patient denies SI/HI/AVH, but states that he has severe anxiety and depression.  He rates his anxiety and depression at this time a 7/10 with 10 being severe.  He states they have been depressed and anxious for about 3 months, says that he does not get out and socialize, states that he just stays in the house and watches TV.  Patient states that he has 2 sisters check on him regularly, that lives close to him.  He states that he has limited finances, so he does not get to do a lot of things says that he feels hopeless and does not have an interest in the usual pleasures.  Patient states that he does want help with his depression and anxiety, does not want to be admitted to an inpatient psychiatric facility, due to having to spend the night.  But he states he is open to receiving outpatient therapy, especially where he can have medication management and be able to  speak with a therapist.  Patient states that he has been on depression medication, but has no mental health providers and has had no history of inpatient psychiatric treatment, but has seen several psychiatrists in his past.  Patient states his appetite and sleep are poor due to the anxiety and depression, patient states he also states that he sleeps about 16 a day and has not been attending to his hygiene and grooming.  During evaluation Jacob [name] is standing in his room no acute distress.  Pt is dressed appropriately for environment, alert and oriented x4. He speaks in a clear tone, at decreased volume and normal pace. Eye contact is good. Pt's mood is depressed, anxious, and hopeless, affect is congruent with mood. Thought process is coherent and relevant. There is no indication he is currently responding to internal stimuli or experiencing delusional thought content. He also denies suicidal/self-harm/homicidal ideation, psychosis, and paranoia.  Patient answered question appropriately.    Spoke with his sister Caprice Beaver and she verified that patient has been having depression symptoms, states that he is not taking care of his hygiene anymore, says he just sleeps and lays in the bed.  She states that he has been on several different medications for depression and none of them appear to be working, says that he is not the same man that he used to be.  Provider discussed with her that patient states he has a gun in his home, [name] says that he does  not feel he would harm himself with a gun, states he has had a gun for years but says that she will get the gun, and store it at her home for safety.  She says that she does not live too far down the street from patient and that she and her other sister continue to check on him, states that she will be the one coming to pick him up once discharged.  Discussed with Ms. Mosley, the services of behavioral health urgent care, informed her that she could  take patient after he is discharged to behavioral health urgent care and he can receive services from the open Access.  Informed her that open access is a part of behavioral health urgent care where patient can receive medication management and therapy.   At this time Jacob [name] is educated and verbalizes understanding of mental health resources and other crisis services in the community. [name] instructed to call 911 and present to the nearest emergency room should he experience any suicidal/homicidal ideation, auditory/visual/hallucinations, or detrimental worsening of his mental health condition.   Past Psychiatric History: Anxiety and depression  Risk to Self or Others: Risk to Self: No Risk to Others:  No  Prior Inpatient Therapy: No  Prior Outpatient Therapy: Yes    [place] Scale:  Flowsheet Row ED from 10/02/2022 in [facility] Emergency Department at [facility] ED from 04/29/2022 in [facility] Emergency Department at [facility] ED from 03/15/2022 in [facility] Emergency Department at [facility]  C-SSRS RISK CATEGORY No Risk No Risk No Risk       AIMS:  , , ,  ,   ASAM: ASAM Multidimensional Assessment Summary Dimension 1:  Description of individual's past and current experiences of substance use and withdrawal: Pt denies alcohol or substance use. DImension 1:  Acute Intoxication and/or Withdrawal Potential Severity Rating: None Dimension 2:  Description of patient's biomedical conditions and  complications: Pt denies alcohol or substance use. Dimension 2:  Biomedical Conditions and Complications Severity Rating: None Dimension 3:  Description of emotional, behavioral, or cognitive conditions and complications: Pt denies alcohol or substance use. Dimension 3:  Emotional, behavioral or cognitive (EBC) conditions and complications severity rating: None Dimension 4:  Description of Readiness to Change criteria: Pt denies alcohol or substance  use. Dimension 4:  Readiness to Change Severity Rating: None Dimension 5:  Relapse, continued use, or continued problem potential critiera description: Pt denies alcohol or substance use. Dimension 5:  Relapse, continued use, or continued problem potential severity rating: None Dimension 6:  Recovery/Iiving environment criteria description: Pt denies alcohol or substance use. Dimension 6:  Recovery/living environment severity rating: None ASAM's Severity Rating Score: 0 ASAM Recommended Level of Treatment:  (NA)  Substance Abuse:  Alcohol / Drug Use Pain Medications: Denies abuse Prescriptions: Denies abuse Over the Counter: Denies abuse History of alcohol / drug use?: No history of alcohol / drug abuse Longest period of sobriety (when/how long): NA Negative Consequences of Use:  (NA) Withdrawal Symptoms: None  Past Medical History:  Past Medical History:  Diagnosis Date   Anxiety    Arthritis    "hurt qwhere in my bones; especially early in the morning" (06/08/2016)   Chronic cough 08/10/2011   more than 6 yrs from sinus drainage   Colonic polyp 08/04/2011   08/2011: colonoscopy and biopsies:   Descending colon: tubular adenoma>>>no high grade dysplasia or malignancy   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 meds   Increased urinary  frequency    Kidney stones 08/10/2011   once in past, none recent   Methylprednisolone-induced hypoxia 08/23/2011   Neuromuscular disorder (HCC) 08/10/2011   "burning/tingling sensation In feet"   Obesity    Obesity, Class II, BMI 35-39.9, with comorbidity 06/29/2006   OSA (obstructive sleep apnea)    "couldn't deal w/CPAP" (06/08/2016)   Preoperative clearance    Prostate cancer (HCC)    RUQ pain 06/09/2016   Shortness of breath 08/10/2011   with increased activity only   Syncope and collapse 06/09/2016   Syncope and collapse 06/09/2016   Type II diabetes mellitus (HCC)    Umbilical hernia     Past Surgical History:  Procedure Laterality Date   CATARACT EXTRACTION W/ INTRAOCULAR LENS  IMPLANT, BILATERAL Bilateral 08/10/2011 - ~ 2014   "right - left"   CHOLECYSTECTOMY N/A 06/10/2016   Procedure: LAPAROSCOPIC CHOLECYSTECTOMY WITH INTRAOPERATIVE CHOLANGIOGRAM;  Surgeon: Harriette Bouillon, MD;  Location: [facility] OR;  Service: General;  Laterality: N/A;   COLONOSCOPY W/ BIOPSIES AND POLYPECTOMY  2013   HERNIA REPAIR     LIPOMA EXCISION Right 10/18/2018   Procedure: EXCISION RIGHT BACK  LIPOMA X 2;  Surgeon: Harriette Bouillon, MD;  Location: [facility];  Service: General;  Laterality: Right;   LYMPHADENECTOMY Bilateral 02/14/2022   Procedure: LYMPHADENECTOMY, PELVIC;  Surgeon: Heloise Purpura, MD;  Location: [facility];  Service: Urology;  Laterality: Bilateral;   ROBOT ASSISTED LAPAROSCOPIC RADICAL PROSTATECTOMY N/A 02/14/2022   Procedure: XI ROBOTIC ASSISTED LAPAROSCOPIC RADICAL PROSTATECTOMY LEVEL 2;  Surgeon: Heloise Purpura, MD;  Location: [facility];  Service: Urology;  Laterality: N/A;   VENTRAL HERNIA REPAIR  08/22/2011   Procedure: HERNIA REPAIR VENTRAL ADULT;  Surgeon: Ernestene Mention, MD;  Location: [facility];  Service: General;  Laterality: N/A;  incarcerated   Family History:  Family History  Problem Relation Age of Onset   Hyperlipidemia Mother    Diabetes Mother    Stroke  Mother    Hyperlipidemia Father    Colon cancer Neg Hx     Social History:  Social History   Substance and Sexual Activity  Alcohol Use No     Social History   Substance and Sexual Activity  Drug Use No    Social History   Socioeconomic History   Marital status: Single    Spouse name: Not on file   Number of children: 0   Years of education: Not on file   Highest education level: Not on file  Occupational History   Occupation: Unemployed   Occupation: Disabled  Tobacco Use   Smoking status: Never   Smokeless tobacco: Never  Vaping Use   Vaping Use: Never used  Substance and Sexual Activity   Alcohol use: No   Drug use: No  Sexual activity: Never  Other Topics Concern   Not on file  Social History Narrative   Daily caffeine    Social Determinants of Health   Financial Resource Strain: Not on file  Food Insecurity: No Food Insecurity (02/22/2022)   Hunger Vital Sign    Worried About Running Out of Food in the Last Year: Never true    Ran Out of Food in the Last Year: Never true  Transportation Needs: No Transportation Needs (02/22/2022)   PRAPARE - Administrator, Civil Service (Medical): No    Lack of Transportation (Non-Medical): No  Physical Activity: Not on file  Stress: Stress Concern Present (04/16/2019)   [employer] of Occupational Health - Occupational Stress Questionnaire    Feeling of Stress : Very much  Social Connections: Not on file      Allergies:   Allergies  Allergen Reactions   Methylprednisolone Sodium Succ     Respiratory failure Pt states he has never had respiratory failure, does not recall this reaction   Ms Contin [Morphine] Nausea Only    Labs:  Results for orders placed or performed during the hospital encounter of 10/02/22 (from the past 48 hour(s))  Basic metabolic panel     Status: Abnormal   Collection Time: 10/02/22  8:52 PM  Result Value Ref Range   Sodium 135 135 - 145 mmol/L   Potassium 3.9  3.5 - 5.1 mmol/L   Chloride 101 98 - 111 mmol/L   CO2 24 22 - 32 mmol/L   Glucose, Bld 200 (H) 70 - 99 mg/dL    Comment: Glucose reference range applies only to samples taken after fasting for at least 8 hours.   BUN 11 8 - 23 mg/dL   Creatinine, Ser 1.61 0.61 - 1.24 mg/dL   Calcium 9.0 8.9 - 09.6 mg/dL   GFR, Estimated >04 >54 mL/min    Comment: (NOTE) Calculated using the CKD-EPI Creatinine Equation (2021)    Anion gap 10 5 - 15    Comment: Performed at [facility], [address], [place], [place] [address]  CBC     Status: Abnormal   Collection Time: 10/02/22  8:52 PM  Result Value Ref Range   WBC 10.7 (H) 4.0 - 10.5 K/uL   RBC 5.38 4.22 - 5.81 MIL/uL   Hemoglobin 14.7 13.0 - 17.0 g/dL   HCT 91.4 78.2 - 95.6 %   MCV 80.5 80.0 - 100.0 fL   MCH 27.3 26.0 - 34.0 pg   MCHC 33.9 30.0 - 36.0 g/dL   RDW 21.3 08.6 - 57.8 %   Platelets 184 150 - 400 K/uL   nRBC 0.0 0.0 - 0.2 %    Comment: Performed at [facility], [address], [place], [place] [address]  Troponin I (High Sensitivity)     Status: None   Collection Time: 10/02/22  8:52 PM  Result Value Ref Range   Troponin I (High Sensitivity) 8 <18 ng/L    Comment: (NOTE) Elevated high sensitivity troponin I (hsTnI) values and significant  changes across serial measurements may suggest ACS but many other  chronic and acute conditions are known to elevate hsTnI results.  Refer to the "Links" section for chest pain algorithms and additional  guidance. Performed at [facility], [address], [place], [place] [address]   Troponin I (High Sensitivity)     Status: None   Collection Time: 10/02/22 10:46 PM  Result Value Ref Range  Troponin I (High Sensitivity) 8 <18 ng/L    Comment: (NOTE) Elevated high sensitivity troponin I (hsTnI) values and significant  changes across serial measurements may suggest ACS but many other  chronic and acute conditions are known to  elevate hsTnI results.  Refer to the "Links" section for chest pain algorithms and additional  guidance. Performed at [facility], [address] [place], [place] [address]   Brain natriuretic peptide     Status: None   Collection Time: 10/02/22 10:46 PM  Result Value Ref Range   B Natriuretic Peptide 53.0 0.0 - 100.0 pg/mL    Comment: Performed at [facility], [address] [place], [place] [address]  D-dimer, quantitative     Status: Abnormal   Collection Time: 10/02/22 10:46 PM  Result Value Ref Range   D-Dimer, Quant 0.65 (H) 0.00 - 0.50 ug/mL-FEU    Comment: (NOTE) At the manufacturer cut-off value of 0.5 g/mL FEU, this assay has a negative predictive value of 95-100%.This assay is intended for use in conjunction with a clinical pretest probability (PTP) assessment model to exclude pulmonary embolism (PE) and deep venous thrombosis (DVT) in outpatients suspected of PE or DVT. Results should be correlated with clinical presentation. Performed at [facility], [address] [place], [place] [address]     Current Facility-Administered Medications  Medication Dose Route Frequency Provider Last Rate Last Admin   acetaminophen (TYLENOL) tablet 500 mg  500 mg Oral Daily PRN Roxy Horseman, PA-C   500 mg at 10/03/22 0848   amLODipine (NORVASC) tablet 10 mg  10 mg Oral Daily Roxy Horseman, PA-C       icosapent Ethyl (VASCEPA) 1 g capsule 2 g  2 g Oral BID Roxy Horseman, PA-C       metFORMIN (GLUCOPHAGE) tablet 1,000 mg  1,000 mg Oral BID WC Roxy Horseman, PA-C   1,000 mg at 10/03/22 0848   rosuvastatin (CRESTOR) tablet 40 mg  40 mg Oral Daily Roxy Horseman, PA-C       traZODone (DESYREL) tablet 50 mg  50 mg Oral QHS Roxy Horseman, PA-C   50 mg at 10/03/22 0211   Current Outpatient Medications  Medication Sig Dispense Refill   acetaminophen (TYLENOL) 500 MG tablet Take 500 mg by mouth daily as needed  for moderate pain.     amLODipine (NORVASC) 10 MG tablet TAKE 1 TABLET BY MOUTH EVERY DAY. This will be your last refill until you are seen in the office to discuss blood pressure. (Patient taking differently: Take 10 mg by mouth daily.) 90 tablet 3   rosuvastatin (CRESTOR) 40 MG tablet Take 1 tablet (40 mg total) by mouth daily. 90 tablet 3   icosapent Ethyl (VASCEPA) 1 g capsule Take 2 capsules (2 g total) by mouth 2 (two) times daily. (Patient not taking: Reported on 10/02/2022) 120 capsule 2   metFORMIN (GLUCOPHAGE) 1000 MG tablet TAKE 1 TABLET (1,000 MG TOTAL) BY MOUTH 2 (TWO) TIMES DAILY WITH A MEAL. (Patient not taking: Reported on 10/02/2022) 60 tablet 0   traZODone (DESYREL) 50 MG tablet Take 1 tablet (50 mg total) by mouth at bedtime. (Patient not taking: Reported on 10/02/2022) 14 tablet 0    Musculoskeletal: Strength & Muscle Tone: within normal limits Gait & Station: normal Patient leans: N/A   Psychiatric Specialty Exam: Presentation  General Appearance:  Disheveled  Eye Contact: Fleeting  Speech: Clear and Coherent  Speech Volume: Normal  Handedness: Right   Mood and  Affect  Mood: Depressed  Affect: Flat; Appropriate   Thought Process  Thought Processes: Coherent  Descriptions of Associations:Intact  Orientation:Full (Time, Place and Person)  Thought Content:WDL  History of Schizophrenia/Schizoaffective disorder:No  Duration of Psychotic Symptoms:No data recorded Hallucinations:Hallucinations: None  Ideas of Reference:None  Suicidal Thoughts:Suicidal Thoughts: No  Homicidal Thoughts:Homicidal Thoughts: No   Sensorium  Memory: Immediate Good; Recent Good; Remote Fair  Judgment: Fair  Insight: Fair   Art therapist  Concentration: Good  Attention Span: Good  Recall: Good  Fund of Knowledge: Good  Language: Good   Psychomotor Activity  Psychomotor Activity: Psychomotor Activity: Normal   Assets   Assets: Communication Skills; Desire for Improvement; Social Support; Housing    Sleep  Sleep: Sleep: Poor   Physical Exam: Physical Exam Vitals and nursing note reviewed.  Neurological:     Mental Status: [name] alert.  Psychiatric:        Attention and Perception: Attention normal.        Mood and Affect: Mood normal.        Speech: Speech normal.        Behavior: Behavior is cooperative.        Thought Content: Thought content normal.        Cognition and Memory: Memory normal.        Judgment: Judgment normal.    Review of Systems  Constitutional: Negative.   Musculoskeletal: Negative.   Psychiatric/Behavioral:  Positive for depression.    Blood pressure (!) 173/115, pulse 93, temperature 98 F (36.7 C), temperature source Oral, resp. rate 20, height 5\' 9"  (1.753 m), weight 102.1 kg, SpO2 94 %. Body mass index is 33.23 kg/m.   Medical Decision Making: Patient is psychiatrically cleared. Patient case review and discussed with Dr. Lucianne Muss, and patient does not meet inpatient criteria for inpatient psychiatric treatment. At time of discharge, patient denies SI, HI, AVH and can contract for safety. He demonstrated no overt evidence of psychosis or mania. Prior to discharge, he verbalized that they understood warning signs, triggers, and symptoms of worsening mental health and how to access emergency mental health care if they felt it was needed. Patient was instructed to call 911 or return to the emergency room if they experienced any concerning symptoms after discharge. Patient voiced understanding and agreed to the above.  Patient given resources to follow up with behavioral health urgent care for therapy and medication management. Safety planning completed.    Safety Plan Jacob [name] will reach out to his sister Caprice Beaver, call 911 or call mobile crisis, or go to nearest emergency room if condition worsens or if suicidal thoughts become active Patients' will follow  up with behavioral health urgent care open access for outpatient psychiatric services (therapy/medication management).  The suicide prevention education provided includes the following: Suicide risk factors Suicide prevention and interventions National Suicide Hotline telephone number [facility] assessment telephone number [facility] Emergency Assistance 911 [facility] and/or Residential Mobile Crisis Unit telephone number Request made of family/significant other to:  sister Caprice Beaver Remove weapons (e.g., guns, rifles, knives), all items previously/currently identified as safety concern.   Remove drugs/medications (over the counter, prescriptions, illicit drugs), all items previously/currently identified as a safety concern.    Disposition: Patient does not meet criteria for psychiatric inpatient admission. Supportive therapy provided about ongoing stressors. Discussed crisis plan, support from social network, calling 911, coming to the Emergency Department, and calling Suicide Hotline.  Macaela Presas MOTLEY-MANGRUM, PMHNP 10/03/2022 10:49 AM

## 2022-10-07 ENCOUNTER — Telehealth: Payer: Self-pay

## 2022-10-07 NOTE — Telephone Encounter (Signed)
Transition Care Management Unsuccessful Follow-up Telephone Call  Date of discharge and from where:  10/03/2022 [facility]  Attempts:  1st Attempt  Reason for unsuccessful TCM follow-up call:  No answer/busy  Eladio Dentremont [facility]  [facility] Population Health Community Resource Care Guide   [email or web address]  ?? [phone]   Website: [email or web address]  [email or web address]

## 2022-10-10 ENCOUNTER — Telehealth: Payer: Self-pay

## 2022-10-10 NOTE — Telephone Encounter (Signed)
Transition Care Management Unsuccessful Follow-up Telephone Call  Date of discharge and from where:  10/03/2022 [facility]  Attempts:  2nd Attempt  Reason for unsuccessful TCM follow-up call:  Left voice message  Thara Searing [facility]  [facility] Population Health Community Resource Care Guide   [email or web address]  ?? [phone]   Website: [email or web address]  [email or web address]

## 2022-10-12 ENCOUNTER — Telehealth: Payer: Self-pay

## 2022-10-12 NOTE — Telephone Encounter (Signed)
LVM in reference to upcoming [facility] appointment on 7/9, left my call back and also let him know I was sending a packet for him to fill out and bring when he comes

## 2022-11-07 NOTE — Progress Notes (Unsigned)
                               Care Plan Summary  Name: Jacob [name] DOB: 10/17/1955   Your Medical Team:   Urologist -  Dr. Heloise Purpura, [facility]  Radiation Oncologist - Dr. Margaretmary Dys, [facility]     Recommendations: 1) Hormonal therapy (Started on 5/30).  2) Will continue to evaluate readiness for radiation.     * These recommendations are based on information available as of today's consult.      Recommendations may change depending on the results of further tests or exams.    Next Steps: 1) Jacob [name], your nurse navigator, will help connect you to resources.    2) Continue follow up's with urology.    When appointments need to be scheduled, you will be contacted by [facility] and/or [facility].  Questions?  Please do not hesitate to call Jacob [name], BSN, RN at [phone] with any questions or concerns.  Jacob [name] is your Oncology Nurse Navigator and is available to assist you while you're receiving your medical care at [facility].

## 2022-11-07 NOTE — Progress Notes (Signed)
RN left message regarding upcoming appointment for the [facility] with arrival time of 12:30.   Direct contact left for any prior questions.

## 2022-11-07 NOTE — Progress Notes (Signed)
Radiation Oncology         [phone] ________________________________  Multidisciplinary Prostate Cancer Clinic  Initial Radiation Oncology Consultation  Name: Jacob [name] MRN: [id number]  Date: 11/08/2022  DOB: 01/25/1956  [id number], Jacob Deutscher, DO  Heloise Purpura, MD   REFERRING PHYSICIAN: Heloise Purpura, MD  DIAGNOSIS: 67 y.o. gentleman with a rising PSA of 2.6 s/p RALP 01/2022 for Stage pT3bN1, Gleason 4+5 prostate cancer    ICD-10-CM   1. Malignant neoplasm of prostate (HCC)  C61       HISTORY OF PRESENT ILLNESS: Jacob [name] is a 67 y.o. male with a diagnosis of locally advanced prostate cancer, involving 1 of 12 nodes sampled at the time of his robotic prostatectomy.   In summary, he was initially referred to Dr. Arita Miss for urinary retention in 07/2021. He was also found to have an elevated PSA of 55 in 08/2021. He was subsequently diagnosed with Gleason 4+5 prostate cancer on TRUSPBx on 10/12/22. Of note, only 1/12 cores showed Gleason 4+5 disease, while the remaining 11/12 showed Gleason 7 (4+3 and 3+4).     His staging PSMA PET scan from 01/04/22 was negative. He opted to proceed with RALP on 02/14/22 under the care of of Dr. Laverle Patter.  Final surgical pathology revealed pT3b N1 MX, Gleason 4+5 prostatic adenocarcinoma with multifocal extraprostatic extension, multifocal surgical margin involvement, left seminal vesicle invasion, and one positive lymph node (1/12).           His postoperative course was somewhat complicated by significant abdominal pain and dysuria. He was treated for UTI. When he returned in 07/2022 for his postoperative follow up, his PSA was 2.4.  He underwent restaging PSMA PET scan on 09/01/22 which did not show any evidence of visible local prostate cancer recurrence, metastatic adenopathy, visceral metastasis, or skeletal metastasis. A repeat PSA the following week was slightly further elevated at 2.61.  He continues to struggle with postoperative  incontinence and does not feel like he has made any significant improvement in the last 6 weeks despite his home exercise program.   The patient reviewed the recent labs and surgical pathology results with his urologist and he has kindly been referred today to the multidisciplinary prostate cancer clinic for presentation of pathology and radiology studies in our conference for discussion of potential salvage radiation treatment options and clinical evaluation.  He was started on ADT with Eligard on 09/29/22.   PREVIOUS RADIATION THERAPY: No  PAST MEDICAL HISTORY:  Past Medical History:  Diagnosis Date   Anxiety    Arthritis    "hurt qwhere in my bones; especially early in the morning" (06/08/2016)   Chronic cough 08/10/2011   more than 6 yrs from sinus drainage   Colonic polyp 08/04/2011   08/2011: colonoscopy and biopsies:   Descending colon: tubular adenoma>>>no high grade dysplasia or malignancy   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 meds   Increased urinary frequency    Kidney stones 08/10/2011   once in past, none recent   Methylprednisolone-induced hypoxia 08/23/2011   Neuromuscular disorder (HCC) 08/10/2011   "burning/tingling sensation In feet"   Obesity    Obesity, Class II, BMI 35-39.9, with comorbidity 06/29/2006   OSA  (obstructive sleep apnea)    "couldn't deal w/CPAP" (06/08/2016)   Preoperative clearance    Prostate cancer (HCC)    RUQ pain 06/09/2016   Shortness of breath 08/10/2011   with increased activity only   Syncope and collapse 06/09/2016   Syncope and collapse 06/09/2016   Type II diabetes mellitus (HCC)    Umbilical hernia       PAST SURGICAL HISTORY: Past Surgical History:  Procedure Laterality Date   CATARACT EXTRACTION W/ INTRAOCULAR LENS  IMPLANT, BILATERAL Bilateral 08/10/2011 - ~ 2014   "right - left"   CHOLECYSTECTOMY N/A 06/10/2016   Procedure: LAPAROSCOPIC CHOLECYSTECTOMY WITH INTRAOPERATIVE CHOLANGIOGRAM;  Surgeon: Harriette Bouillon, MD;  Location: [facility];  Service: General;  Laterality: N/A;   COLONOSCOPY W/ BIOPSIES AND POLYPECTOMY  2013   HERNIA REPAIR     LIPOMA EXCISION Right 10/18/2018   Procedure: EXCISION RIGHT BACK  LIPOMA X 2;  Surgeon: Harriette Bouillon, MD;  Location: [facility];  Service: General;  Laterality: Right;   LYMPHADENECTOMY Bilateral 02/14/2022   Procedure: LYMPHADENECTOMY, PELVIC;  Surgeon: Heloise Purpura, MD;  Location: [facility];  Service: Urology;  Laterality: Bilateral;   ROBOT ASSISTED LAPAROSCOPIC RADICAL PROSTATECTOMY N/A 02/14/2022   Procedure: XI ROBOTIC ASSISTED LAPAROSCOPIC RADICAL PROSTATECTOMY LEVEL 2;  Surgeon: Heloise Purpura, MD;  Location: [facility];  Service: Urology;  Laterality: N/A;   VENTRAL HERNIA REPAIR  08/22/2011   Procedure: HERNIA REPAIR VENTRAL ADULT;  Surgeon: Ernestene Mention, MD;  Location: [facility];  Service: General;  Laterality: N/A;  incarcerated    FAMILY HISTORY:  Family History  Problem Relation Age of Onset   Hyperlipidemia Mother    Diabetes Mother    Stroke Mother    Hyperlipidemia Father    Colon cancer Neg Hx     SOCIAL HISTORY:  Social History   Socioeconomic History   Marital status: Single    Spouse name: Not on file   Number of children: 0   Years of education: Not on file   Highest  education level: Not on file  Occupational History   Occupation: Unemployed   Occupation: Disabled  Tobacco Use   Smoking status: Never   Smokeless tobacco: Never  Vaping Use   Vaping Use: Never used  Substance and Sexual Activity   Alcohol use: No   Drug use: No   Sexual activity: Never  Other Topics Concern   Not on file  Social History Narrative   Daily caffeine    Social Determinants of Health   Financial Resource Strain: Not on file  Food Insecurity: No Food Insecurity (02/22/2022)   Hunger Vital Sign    Worried About Running Out of Food in the Last Year: Never true    Ran Out of Food in the Last Year: Never true  Transportation Needs: No Transportation Needs (02/22/2022)   PRAPARE - Administrator, Civil Service (Medical): No    Lack of Transportation (Non-Medical): No  Physical Activity: Not on file  Stress: Stress Concern Present (04/16/2019)   [employer] of Occupational Health - Occupational Stress Questionnaire  Feeling of Stress : Very much  Social Connections: Not on file  Intimate Partner Violence: Not At Risk (02/14/2022)   Humiliation, Afraid, Rape, and Kick questionnaire    Fear of Current or Ex-Partner: No    Emotionally Abused: No    Physically Abused: No    Sexually Abused: No    ALLERGIES: Methylprednisolone sodium succ and Ms contin [morphine]  MEDICATIONS:  Current Outpatient Medications  Medication Sig Dispense Refill   acetaminophen (TYLENOL) 500 MG tablet Take 500 mg by mouth daily as needed for moderate pain.     amLODipine (NORVASC) 10 MG tablet TAKE 1 TABLET BY MOUTH EVERY DAY. This will be your last refill until you are seen in the office to discuss blood pressure. (Patient taking differently: Take 10 mg by mouth daily.) 90 tablet 3   icosapent Ethyl (VASCEPA) 1 g capsule Take 2 capsules (2 g total) by mouth 2 (two) times daily. (Patient not taking: Reported on 10/02/2022) 120 capsule 2   metFORMIN (GLUCOPHAGE) 1000 MG  tablet TAKE 1 TABLET (1,000 MG TOTAL) BY MOUTH 2 (TWO) TIMES DAILY WITH A MEAL. (Patient not taking: Reported on 10/02/2022) 60 tablet 0   rosuvastatin (CRESTOR) 40 MG tablet Take 1 tablet (40 mg total) by mouth daily. 90 tablet 3   traZODone (DESYREL) 50 MG tablet Take 1 tablet (50 mg total) by mouth at bedtime. (Patient not taking: Reported on 10/02/2022) 14 tablet 0   No current facility-administered medications for this encounter.    REVIEW OF SYSTEMS:  On review of systems, the patient reports that he is doing well overall. He denies any chest pain, shortness of breath, cough, fevers, chills, night sweats, unintended weight changes. He denies any bowel disturbances, and denies abdominal pain, nausea or vomiting. He denies any new musculoskeletal or joint aches or pains. His IPSS was 14, indicating moderate urinary symptoms. He reports persistent postoperative incontinence, using 1ppd. His SHIM was 9, indicating he has moderate erectile dysfunction. A complete review of systems is obtained and is otherwise negative.    PHYSICAL EXAM:  Wt Readings from Last 3 Encounters:  10/02/22 225 lb (102.1 kg)  02/14/22 233 lb 6.4 oz (105.9 kg)  02/10/22 233 lb 6.4 oz (105.9 kg)   Temp Readings from Last 3 Encounters:  10/03/22 98 F (36.7 C) (Oral)  04/29/22 99.8 F (37.7 C)  03/15/22 98.5 F (36.9 C) (Oral)   BP Readings from Last 3 Encounters:  10/03/22 (!) 151/101  04/29/22 (!) 152/93  03/15/22 114/84   Pulse Readings from Last 3 Encounters:  10/03/22 93  04/29/22 100  03/15/22 96    /10  In general this is a well appearing Caucasian man in no acute distress. He's alert and oriented x4 and appropriate throughout the examination. Cardiopulmonary assessment is negative for acute distress, and he exhibits normal effort.     KPS = 90  100 - Normal; no complaints; no evidence of disease. 90   - Able to carry on normal activity; minor signs or symptoms of disease. 80   - Normal activity  with effort; some signs or symptoms of disease. 18   - Cares for self; unable to carry on normal activity or to do active work. 60   - Requires occasional assistance, but is able to care for most of his personal needs. 50   - Requires considerable assistance and frequent medical care. 40   - Disabled; requires special care and assistance. 30   - Severely disabled; hospital admission  is indicated although death not imminent. 20   - Very sick; hospital admission necessary; active supportive treatment necessary. 10   - Moribund; fatal processes progressing rapidly. 0     - Dead  Karnofsky DA, Abelmann WH, Craver LS and Burchenal [facility] [phone]) The use of the nitrogen mustards in the palliative treatment of carcinoma: with particular reference to bronchogenic carcinoma Cancer 1 [id number]  LABORATORY DATA:  Lab Results  Component Value Date   WBC 10.7 (H) 10/02/2022   HGB 14.7 10/02/2022   HCT 43.3 10/02/2022   MCV 80.5 10/02/2022   PLT 184 10/02/2022   Lab Results  Component Value Date   NA 135 10/02/2022   K 3.9 10/02/2022   CL 101 10/02/2022   CO2 24 10/02/2022   Lab Results  Component Value Date   ALT 10 04/29/2022   AST 13 (L) 04/29/2022   ALKPHOS 61 04/29/2022   BILITOT 0.7 04/29/2022     RADIOGRAPHY: No results found.    IMPRESSION/PLAN: 1. 67 y.o. gentleman with a rising, detectable postoperative PSA of 2.6 s/p RALP 01/2022 for Stage pT3bN1, Gleason 4+5 prostate cancer. Today, we reviewed the findings and workup thus far.  We discussed the natural history of prostate cancer.  We reviewed the the implications of positive margins, extracapsular extension, and seminal vesicle involvement on the risk of prostate cancer recurrence. In his case, multifocal extraprostatic extension, multifocal margin involvement, left seminal vesicle invasion, and one positive lymph node were all present. We reviewed some of the evidence suggesting an advantage for patients who undergo salvage  radiotherapy in this setting in terms of disease control and overall survival. We discussed radiation treatment directed to the prostatic fossa and pelvic lymph nodes with regard to the logistics and delivery of external beam radiation treatment.  At the conclusion of our conversation, the patient is interested in moving forward with 7.5 weeks of salvage radiation treatment directed to the prostatic fossa and pelvic lymph nodes in combination with ADT. He received his first Eligard injection on 09/29/22 so we would recommend the patient take more time to work on regaining continence before starting the salvage EBRT. The patient appears to have a good understanding of his disease and our treatment recommendations which are of curative intent and is in agreement with the stated plan.  Therefore, we will share our discussion with Dr. Laverle Patter and move forward with treatment planning once he has regained better bladder control.  He will continue in routine follow-up under the care and direction of Dr. Laverle Patter in the meantime and he will let us know when the patient is ready to proceed with the daily radiation so that we can make arrangements for CT SIM/treatment planning accordingly.  We enjoyed meeting him today and look forward to continuing to participate in his care.  He knows that he is welcome to call at anytime with any questions or concerns related to the treatment recommendations discussed today.  We personally spent 60 minutes in this encounter including chart review, reviewing radiological studies, meeting face-to-face with the patient, entering orders and completing documentation.   Marguarite Arbour, PA-C    Margaretmary Dys, MD  [facility]  Radiation Oncology Direct Dial: [phone]  Fax: [phone] [email or web address]  Skype  LinkedIn   This document serves as a record of services personally performed by Margaretmary Dys, MD and Marcello Fennel, PA-C. It was created on their behalf by Mickie Bail, a trained medical scribe. The creation of this record is based on  the scribe's personal observations and the provider's statements to them. This document has been checked and approved by the attending provider.

## 2022-11-08 ENCOUNTER — Inpatient Hospital Stay: Payer: 59 | Attending: Radiation Oncology | Admitting: Genetic Counselor

## 2022-11-08 ENCOUNTER — Encounter: Payer: Self-pay | Admitting: Radiation Oncology

## 2022-11-08 ENCOUNTER — Ambulatory Visit
Admission: RE | Admit: 2022-11-08 | Discharge: 2022-11-08 | Disposition: A | Discharge status: Home / Self Care | Payer: 59 | Source: Ambulatory Visit | Attending: Radiation Oncology | Admitting: Radiation Oncology

## 2022-11-08 ENCOUNTER — Other Ambulatory Visit: Payer: Self-pay

## 2022-11-08 VITALS — BP 143/97 | HR 122 | Temp 97.3°F | Resp 20 | Ht 69.0 in | Wt 220.6 lb

## 2022-11-08 DIAGNOSIS — C61 Malignant neoplasm of prostate: Secondary | ICD-10-CM

## 2022-11-08 DIAGNOSIS — C775 Secondary and unspecified malignant neoplasm of intrapelvic lymph nodes: Secondary | ICD-10-CM | POA: Diagnosis not present

## 2022-11-08 DIAGNOSIS — Z191 Hormone sensitive malignancy status: Secondary | ICD-10-CM | POA: Diagnosis not present

## 2022-11-08 NOTE — Consult Note (Signed)
Multi-Disciplinary Clinic     11/08/2022   --------------------------------------------------------------------------------   Jacob [name]  MRN: [id number]  DOB: Sep 01, 1955, 67 year old Male  SSN:    PRIMARY CARE:     REFERRING:  Belva Agee, MD  PROVIDER:  Kasandra Knudsen, M.D.  TREATING:  Heloise Purpura, M.D.  LOCATION:  [facility] [phone]     --------------------------------------------------------------------------------   CC/HPI: CC: Prostate Cancer   Location of consult: [facility]   [name] is a 67 year old gentleman with a history of BPH and LUTS who was on tamsulosin and finasteride. He then developed urinary retention in March 2023 requiring catheter placement in the ED. He presented for urologic evaluation and his tamsulosin was increased to BID dosing. He failed a voiding trial in late April 2023. He also was noted to have right sided prostate firmness and a elevated PSA of 55.0 prompting a TRUS biopsy of the prostate on 10/11/21 confirming Gleason 4+5=9 adenocarcinoma with 12 out of 12 biopsy cores positive for malignancy. Staging imaging with conventional imaging was negative for metastatic disease. He presented to discussion regarding options and was advised of the PROTEUS trial at that time but he declined. PSMA PET imaging was then performed in September 2023 and was negative for metastatic disease. After much deliberation, he finally decided to proceed with primary surgical therapy and underwent a NNS RAL radical prostatectomy and BPLND on 02/14/22. Pathology demonstrated pT3b N1 Mx, Gleason 4+5=9 adenocarcinoma with multiple positive surgical margins. He missed multiple follow up appointments and finally had his first postoperative PSA in March 2024 that was 2.4. He then followed up in early May and his PSA was 2.61 and a PSMA PET scan from 09/01/22 demonstrated no obvious uptake. He started ADT with Eligard 45 mg on 09/29/22.    PMH: He has a history of hypertension, hyperlipidemia, depression, gout, GERD, diabetes, and anxiety.   Interval history:   [name] returns today. He states that he has not noted any improvement in his continence and is still using 1 pad per day. However, he has noted some increased intermittency and some suprapubic pain over the past 3 weeks. He denies dysuria or hematuria. He denies any fever. He remains significantly depressed both chronically but also exacerbated by his recent cancer situation. He also has significant concern regarding his financial means for ongoing treatment.     ALLERGIES: No Known Allergies    MEDICATIONS: Metformin Hcl  Amlodipine Besylate 10 mg tablet  Aspirin Ec 81 mg tablet, delayed release  Rosuvastatin Calcium 40 mg tablet  Rosuvastatin Calcium 40 mg tablet  Sildenafil Citrate 100 mg tablet 1/2 tablet PO Every Other Day PRN  Trazodone Hcl 150 mg tablet  Venlafaxine Hcl Er 150 mg tablet, extended release 24 hr  Vraylar 1.5 mg capsule     GU PSH: Complex cystometrogram, w/ void pressure and urethral pressure profile studies, any technique - 09/03/2021 Complex Uroflow - 09/03/2021 Emg surf Electrd - 09/03/2021 Inject For cystogram - 09/03/2021 Intrabd voidng Press - 09/03/2021 Laparoscopy; Lymphadenectomy - 02/14/2022 Locm 300-399Mg /Ml Iodine,1Ml - 10/29/2021 Prostate Needle Biopsy - 10/11/2021 Robotic Radical Prostatectomy - 02/14/2022       PSH Notes: Umbilical surgery- Hernia, Lipoma removal from back   NON-GU PSH: Cholecystectomy (laparoscopic) Laparoscopy, Surgical; Repair Umbilical Hernia Surgical Pathology, Gross And Microscopic Examination For Prostate Needle - 10/11/2021     GU PMH: Prostate Cancer - 09/29/2022, - 09/06/2022, - 07/27/2022, - 02/24/2022, - 01/18/2022, -  12/07/2021, - 10/29/2021, - 10/13/2021 Stress Incontinence - 09/06/2022, - 07/27/2022, - 02/24/2022 Acute Cystitis/UTI - 04/19/2022, - 08/11/2021 Pelvic/perineal pain - 04/19/2022, -  10/22/2021 Urinary Retention - 01/18/2022, - 10/13/2021, - 09/29/2021, - 09/22/2021, - 09/03/2021, - 09/01/2021, - 08/26/2021, - 08/11/2021 BPH w/LUTS - 10/13/2021, - 09/22/2021, - 09/01/2021, - 08/11/2021 Elevated PSA - 10/11/2021, - 09/29/2021 Prostate nodule w/ LUTS - 10/11/2021, - 09/29/2021 Weak Urinary Stream - 10/11/2021, - 09/22/2021, - 08/11/2021      PMH Notes:   1) Prostate cancer: [name] s/p a NNS RAL radical prostatectomy and BPLND on 02/14/22. He was in urinary retention prior to surgery.   Diagnosis: pT3b N1 Mx, Gleason 4+5=9 adenocarcinoma with multifocal positive margins  Pretreatment PSA: 55.0  Pretreatment SHIM score: Severe erectile dysfunction at baseline   NON-GU PMH: Anxiety Depression Diabetes Type 2 GERD Gout Hypertension    FAMILY HISTORY: No Family History    SOCIAL HISTORY: Marital Status: Single Preferred Language: English; Ethnicity: Not Hispanic Or Latino; Race: White Current Smoking Status: Patient has never smoked.   Tobacco Use Assessment Completed: Used Tobacco in last 30 days? Has never drank.  Drinks 3 caffeinated drinks per day. Patient's occupation is/was Retired.    REVIEW OF SYSTEMS:    GU Review Male:   Patient denies frequent urination, hard to postpone urination, burning/ pain with urination, get up at night to urinate, leakage of urine, stream starts and stops, trouble starting your streams, and have to strain to urinate .  Gastrointestinal (Lower):   Patient denies diarrhea and constipation.  Gastrointestinal (Upper):   Patient denies nausea and vomiting.  Constitutional:   Patient denies fever, night sweats, weight loss, and fatigue.  Skin:   Patient denies skin rash/ lesion and itching.  Eyes:   Patient denies blurred vision and double vision.  Ears/ Nose/ Throat:   Patient denies sore throat and sinus problems.  Hematologic/Lymphatic:   Patient denies swollen glands and easy bruising.  Cardiovascular:   Patient denies leg swelling and chest pains.   Respiratory:   Patient denies cough and shortness of breath.  Endocrine:   Patient denies excessive thirst.  Musculoskeletal:   Patient denies back pain and joint pain.  Neurological:   Patient denies headaches and dizziness.  Psychologic:   Patient denies depression and anxiety.   VITAL SIGNS: None   MULTI-SYSTEM PHYSICAL EXAMINATION:    Constitutional: Well-nourished. No physical deformities. Normally developed. Good grooming.     Complexity of Data:  Lab Test Review:   PSA  Records Review:   Previous Patient Records   09/06/22 07/20/22 09/29/21 09/22/21  PSA  Total PSA 2.61 ng/mL 2.40 ng/mL 53.50 ng/mL 55.00 ng/mL    09/06/22  Hormones  Testosterone, Total 184.6 ng/dL    PROCEDURES: None   ASSESSMENT:      ICD-10 Details  1 GU:   Prostate Cancer - C61   2   Metastatic pelvic lymphadenopathy - C77.5    PLAN:           Document Letter(s):  Created for Patient: Clinical Summary   Created for Patient: Clinical Summary         Notes:   1. Biochemically recurrent prostate cancer: We discussed options and I again recommended that he strongly consider salvage radiation therapy in addition to his androgen deprivation therapy. [name] scheduled to meet with Dr. Kathrynn Running today. He continues to have concerns regarding his financial means and will be referred to social work to help address that through the  cancer center. He understands the importance of regaining his continence. He has refused to proceed with physical therapy. At this time, he will remain on androgen deprivation and plan to keep his appointment with me in the fall. I have encouraged him to let me know if he does regain continence prior to that appointment. Otherwise, we will reevaluate his continence and consider proceeding at that time regardless considering he will be a full year out from his radical prostatectomy.   CC: Dr. Margaretmary Dys        Next Appointment:      Next Appointment: 03/15/2023 03:30 PM     Appointment Type: Laboratory Appointment    Location: [facility] [phone]    Provider: Lab LAB    Reason for Visit: 6 mo PSA      E & M CODES: We spent 35 minutes dedicated to evaluation and management time, including face to face interaction, discussions on coordination of care, documentation, result review, and discussion with others as applicable.

## 2022-11-08 NOTE — Progress Notes (Unsigned)
REFERRING PROVIDER: Margaretmary Dys, MD  PRIMARY PROVIDER:  [name], DO  PRIMARY REASON FOR VISIT:  1. Prostate cancer (HCC)    HISTORY OF PRESENT ILLNESS:   Jacob [name], a 67 y.o. male, was seen for a  cancer genetics consultation at the request of Dr. Kathrynn Running due to a personal history of cancer.  Jacob [name] presents to clinic today to discuss the possibility of a hereditary predisposition to cancer, to discuss genetic testing, and to further clarify his future cancer risks, as well as potential cancer risks for family members.   Jacob [name] was diagnosed with prostate cancer at age 75 (Gleason: 9).  CANCER HISTORY:  Oncology History  Prostate cancer (HCC)  10/11/2021 Cancer Staging   Staging form: Prostate, AJCC 8th Edition - Clinical stage from 10/11/2021: Stage IIIC (cT1c, cN0, cM0, PSA: 55, Grade Group: 5) - Signed by Marcello Fennel, PA-C on 11/08/2022 Histopathologic type: Adenocarcinoma, NOS Stage prefix: Initial diagnosis Prostate specific antigen (PSA) range: 20 or greater Gleason primary pattern: 4 Gleason secondary pattern: 5 Gleason score: 9 Histologic grading system: 5 grade system Number of biopsy cores examined: 12 Number of biopsy cores positive: 12 Location of positive needle core biopsies: Both sides   02/14/2022 Initial Diagnosis   Prostate cancer (HCC)   02/14/2022 Cancer Staging   Staging form: Prostate, AJCC 8th Edition - Pathologic stage from 02/14/2022: Stage IVA (pT3b, pN1, cM0, PSA: 55, Grade Group: 5) - Signed by Marcello Fennel, PA-C on 11/08/2022 Prostate specific antigen (PSA) range: 20 or greater Gleason primary pattern: 4 Gleason secondary pattern: 5 Gleason score: 9 Histologic grading system: 5 grade system Residual tumor (R): R1 - Microscopic     Past Medical History:  Diagnosis Date   Anxiety    Arthritis    "hurt qwhere in my bones; especially early in the morning" (06/08/2016)   Chronic cough 08/10/2011   more than  6 yrs from sinus drainage   Colonic polyp 08/04/2011   08/2011: colonoscopy and biopsies:   Descending colon: tubular adenoma>>>no high grade dysplasia or malignancy   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 meds   Increased urinary frequency    Kidney stones 08/10/2011   once in past, none recent   Methylprednisolone-induced hypoxia 08/23/2011   Neuromuscular disorder (HCC) 08/10/2011   "burning/tingling sensation In feet"   Obesity    Obesity, Class II, BMI 35-39.9, with comorbidity 06/29/2006   OSA (obstructive sleep apnea)    "couldn't deal w/CPAP" (06/08/2016)   Preoperative clearance    Prostate cancer (HCC)    RUQ pain 06/09/2016   Shortness of breath 08/10/2011   with increased activity only   Syncope and collapse 06/09/2016   Syncope and collapse 06/09/2016   Type II diabetes mellitus (HCC)    Umbilical hernia  Past Surgical History:  Procedure Laterality Date   CATARACT EXTRACTION W/ INTRAOCULAR LENS  IMPLANT, BILATERAL Bilateral 08/10/2011 - ~ 2014   "right - left"   CHOLECYSTECTOMY N/A 06/10/2016   Procedure: LAPAROSCOPIC CHOLECYSTECTOMY WITH INTRAOPERATIVE CHOLANGIOGRAM;  Surgeon: Harriette Bouillon, MD;  Location: [facility];  Service: General;  Laterality: N/A;   COLONOSCOPY W/ BIOPSIES AND POLYPECTOMY  2013   HERNIA REPAIR      LIPOMA EXCISION Right 10/18/2018   Procedure: EXCISION RIGHT BACK  LIPOMA X 2;  Surgeon: Harriette Bouillon, MD;  Location: [facility];  Service: General;  Laterality: Right;   LYMPHADENECTOMY Bilateral 02/14/2022   Procedure: LYMPHADENECTOMY, PELVIC;  Surgeon: Heloise Purpura, MD;  Location: [facility];  Service: Urology;  Laterality: Bilateral;   PROSTATE BIOPSY     ROBOT ASSISTED LAPAROSCOPIC RADICAL PROSTATECTOMY N/A 02/14/2022   Procedure: XI ROBOTIC ASSISTED LAPAROSCOPIC RADICAL PROSTATECTOMY LEVEL 2;  Surgeon: Heloise Purpura, MD;  Location: [facility];  Service: Urology;  Laterality: N/A;   VENTRAL HERNIA REPAIR  08/22/2011   Procedure: HERNIA REPAIR VENTRAL ADULT;  Surgeon: Ernestene Mention, MD;  Location: [facility];  Service: General;  Laterality: N/A;  incarcerated    Social History   Socioeconomic History   Marital status: Single    Spouse name: Not on file   Number of children: 0   Years of education: Not on file   Highest education level: Not on file  Occupational History   Occupation: Unemployed   Occupation: Disabled  Tobacco Use   Smoking status: Never   Smokeless tobacco: Never  Vaping Use   Vaping Use: Never used  Substance and Sexual Activity   Alcohol use: No   Drug use: No   Sexual activity: Never  Other Topics Concern   Not on file  Social History Narrative   Daily caffeine    Social Determinants of Health   Financial Resource Strain: Not on file  Food Insecurity: No Food Insecurity (11/08/2022)   Hunger Vital Sign    Worried About Running Out of Food in the Last Year: Never true    Ran Out of Food in the Last Year: Never true  Transportation Needs: No Transportation Needs (11/08/2022)   PRAPARE - Administrator, Civil Service (Medical): No    Lack of Transportation (Non-Medical): No  Physical Activity: Not on file  Stress: Stress Concern Present (04/16/2019)   [employer] of Occupational Health - Occupational Stress  Questionnaire    Feeling of Stress : Very much  Social Connections: Not on file     FAMILY HISTORY:  We obtained a detailed, 4-generation family history.  Significant diagnoses are listed below:  Jacob [name] is unaware of a family history of cancer or genetic testing for hereditary cancer risks.     GENETIC COUNSELING ASSESSMENT: Jacob [name] is a 67 y.o. male with a personal history of cancer which is somewhat suggestive of a hereditary predisposition to cancer given his high risk prostate cancer. We, therefore, discussed and recommended the following at today's visit.   DISCUSSION: We discussed that 5 - 10% of cancer is hereditary, with most cases of prostate cancer associated with BRCA1/2.  There are other genes that can be associated with hereditary prostate cancer syndromes.  We discussed that testing is beneficial for several reasons including knowing how to follow individuals after completing their treatment, identifying whether potential treatment options would be beneficial, and understanding if other family members could be at risk for cancer and allowing  them to undergo genetic testing.   We reviewed the characteristics, features and inheritance patterns of hereditary cancer syndromes. We also discussed genetic testing, including the appropriate family members to test, the process of testing, insurance coverage and turn-around-time for results. We discussed the implications of a negative, positive, carrier and/or variant of uncertain significant result. We recommended Jacob [name] pursue genetic testing for a panel that includes genes associated with prostate cancer.   PLAN: Despite our recommendation, Jacob [name] did not wish to pursue genetic testing at today's visit. We understand this decision and remain available to coordinate genetic testing at any time in the future. We, therefore, recommend Jacob [name] continue to follow the cancer screening guidelines given by his primary  healthcare provider.  Jacob [name] questions were answered to his satisfaction today. Our contact information was provided should additional questions or concerns arise. Thank you for the referral and allowing [place] to share in the care of your patient.   Lalla Brothers, MS, [facility] Genetic Counselor [email or web address] (P) [phone]  The patient was seen for a total of <15 minutes in face-to-face genetic counseling. The patient was seen alone.  Drs. Pamelia Hoit and/or Mosetta Putt were available to discuss this case as needed.   _______________________________________________________________________ For Office Staff:  Number of people involved in session: 1 Was an Intern/ student involved with case: no

## 2022-11-10 ENCOUNTER — Encounter: Payer: Self-pay | Admitting: General Practice

## 2022-11-10 NOTE — Addendum Note (Signed)
Addended by: Deberah Castle on: 11/10/2022 09:06 AM   Modules accepted: Orders

## 2022-11-10 NOTE — Progress Notes (Signed)
[facility] Spiritual Care Note  Referred by nursing for introduction to support resources and additional layer of emotional support. Left voicemail encouraging return call.   [address] Rush Barer, [place], [facility] Pager [phone] Voicemail [phone]

## 2022-11-14 ENCOUNTER — Telehealth: Payer: Self-pay | Admitting: Licensed Clinical Social Worker

## 2022-11-14 NOTE — Telephone Encounter (Signed)
Received referral to contact pt regarding supportive services.  CSW left 2 voice mails for pt last week and today w/ contact information.  CSW to remain available as appropriate to provide appropriate support.

## 2022-11-15 NOTE — Progress Notes (Signed)
RN left message requesting call back to follow up from recent [facility].

## 2022-11-16 ENCOUNTER — Inpatient Hospital Stay: Payer: 59 | Admitting: Licensed Clinical Social Worker

## 2022-11-16 DIAGNOSIS — C61 Malignant neoplasm of prostate: Secondary | ICD-10-CM

## 2022-11-16 NOTE — Progress Notes (Signed)
[facility] Clinical Social Work  Initial Assessment   Jacob [name] is a 67 y.o. year old male contacted by phone. Clinical Social Work was referred by medical provider for assessment of psychosocial needs.   SDOH (Social Determinants of Health) assessments performed: Yes SDOH Interventions    Flowsheet Row Oncology Navigator from 11/08/2022 in [facility] Coordination from 03/02/2022 in [facility] Community Care Coordination Telephone from 02/22/2022 in [facility] Community Care Coordination Chronic Care Management from 04/16/2019 in [facility]  SDOH Interventions      Food Insecurity Interventions Intervention Not Indicated Intervention Not Indicated Intervention Not Indicated --  Housing Interventions Intervention Not Indicated -- -- Other (Comment)  Transportation Interventions Intervention Not Indicated Intervention Not Indicated Intervention Not Indicated --  Utilities Interventions Intervention Not Indicated -- -- --  Depression Interventions/Treatment  Currently on Treatment, Counseling -- -- Referral to Psychiatry  Stress Interventions -- -- -- Provide Counseling       SDOH Screenings   Food Insecurity: No Food Insecurity (11/08/2022)  Housing: Low Risk  (11/08/2022)  Transportation Needs: No Transportation Needs (11/08/2022)  Utilities: Not At Risk (11/08/2022)  Alcohol Screen: Low Risk  (10/04/2018)  Depression (PHQ2-9): Medium Risk (11/08/2022)  Stress: Stress Concern Present (04/16/2019)  Tobacco Use: Low Risk  (11/08/2022)     Distress Screen completed: No     No data to display            Family/Social Information:  Housing Arrangement: patient lives alone.  Per pt he resides in the apartment his father lived in prior to his passing 2 years ago.  The rent is more than pt is able to comfortably afford and he is actively pursuing alternative accommodations. Family members/support  persons in your life? Pt has a sister who resides in [place] that can provide limited support.  Pt has limited support overall, as he does not have children or family other than his sister and is not engaged socially. Transportation concerns: no  Employment: Retired prior pt worked in Production designer, theatre/television/film.  Income source: Actor concerns: Yes, current concerns Type of concern: Utilities, Rent/ mortgage, and Medical bills Food access concerns: no Religious or spiritual practice:  pt was engaged in his church, but has "fallen away from it".  Pt states he would like to re-engage but it is difficult to take the first step Services Currently in place:  none  Coping/ Adjustment to diagnosis: Patient understands treatment plan and what happens next? yes Concerns about diagnosis and/or treatment: Overwhelmed by information, Afraid of cancer, How I will pay for the services I need, How will I care for myself, and Quality of life Patient reported stressors: Finances, Anxiety/ nervousness, Adjusting to my illness, and Isolation/ feeling alone Hopes and/or priorities: Pt's priority is to start treatment w/ the hope of positive results. Patient enjoys  Pt states he used to enjoy playing the guitar and woodworking, but does not have motivation for hobbies. Current coping skills/ strengths: Capable of independent living  and Motivation for treatment/growth     SUMMARY: Current SDOH Barriers:  Financial constraints related to fixed income, Limited social support, and Social Isolation  Clinical Social Work Clinical Goal(s):  Explore community resource options for unmet needs related to:  Corporate treasurer  and Social Connections  Interventions: Discussed common feeling and emotions when being diagnosed with cancer, and the importance of support during treatment Informed patient of the support team roles  and support services at [facility] Provided CSW contact information and encouraged  patient to call with any questions or concerns Provided patient with information about the [employer] and contact information for the financial resource specialist once radiation treatment begins.  Spoke w/ pt about support group and encouraged pt to attend as a means of support as well as to address some of the social isolation.  Pt also encouraged to re-engage in church.  Pt states he has suffered from depression for many years.  Pt is connected to a psychiatrist w/ whom he communicates regularly.   Pt states he was receiving Medicaid; however, he lost it 2 months ago due to a bank balance of over $2,000.  CSW encouraged pt to re-apply w/ the newly diagnosed prostate cancer he may once again be eligible.    Follow Up Plan: Patient will contact CSW with any support or resource needs Patient verbalizes understanding of plan: Yes    Rachel Moulds, LCSW Clinical Social Worker [facility]

## 2022-11-22 NOTE — Progress Notes (Signed)
RN left message for call back from recent [facility] on 7/9.

## 2023-01-27 NOTE — Progress Notes (Signed)
RN left message for call back.  

## 2023-02-01 ENCOUNTER — Other Ambulatory Visit (HOSPITAL_BASED_OUTPATIENT_CLINIC_OR_DEPARTMENT_OTHER): Payer: Self-pay | Admitting: *Deleted

## 2023-02-01 DIAGNOSIS — I10 Essential (primary) hypertension: Secondary | ICD-10-CM

## 2023-02-16 NOTE — Progress Notes (Signed)
RN has made several attempts to contact patient with no success.  Additional voicemail left today for call back.   Patient is scheduled for a PSA check at [facility] on 11/13.   Will continue to follow.

## 2023-02-21 NOTE — Progress Notes (Signed)
RN spoke with patient's sister, [name].  [name] reports she obtained my number from her brother to be able to speak with me to update on his status.  (Sister on HIPAA/ROI)  Patient was presented to the [facility] on 11/08/22 for his rising PSA of 2.6 s/p RALP 01/2022 for Stage pT3bN1, Gleason 4+5 prostate cancer.  Recommendations of hormonal therapy (Eligard 09/29/22), with salvage radiation after working on regaining continence.  [name] reports patient is sleeping all day, not wanting to motivate himself to get out of bed.  [name] inquiring about next steps for his prostate cancer, and concerns about his depression.  I updated [name] on recommendations, and confirmed his upcoming appointment for PSA check.  She reports patient is still incontinent, and has not been doing any PT at home.    I encouraged [name] to contact his PCP for appointment to discuss his depression.  Patient is now scheduled on 11/4 with PCP.  I will also have our Chaplin attempt to reach out to patient for additional emotional support.  Previously patient has not answered phone calls, or called back.  [name] will encourage her brother to answer calls and I confirmed that she and patient have my direct contact.

## 2023-02-27 ENCOUNTER — Encounter: Payer: Self-pay | Admitting: Family Medicine

## 2023-02-27 ENCOUNTER — Ambulatory Visit: Payer: 59 | Admitting: Family Medicine

## 2023-02-27 VITALS — BP 184/94 | HR 91 | Ht 69.0 in | Wt 216.8 lb

## 2023-02-27 DIAGNOSIS — F330 Major depressive disorder, recurrent, mild: Secondary | ICD-10-CM

## 2023-02-27 DIAGNOSIS — Z7984 Long term (current) use of oral hypoglycemic drugs: Secondary | ICD-10-CM | POA: Diagnosis not present

## 2023-02-27 DIAGNOSIS — E119 Type 2 diabetes mellitus without complications: Secondary | ICD-10-CM

## 2023-02-27 DIAGNOSIS — R413 Other amnesia: Secondary | ICD-10-CM | POA: Diagnosis not present

## 2023-02-27 DIAGNOSIS — R339 Retention of urine, unspecified: Secondary | ICD-10-CM | POA: Diagnosis not present

## 2023-02-27 DIAGNOSIS — I10 Essential (primary) hypertension: Secondary | ICD-10-CM

## 2023-02-27 LAB — POCT GLYCOSYLATED HEMOGLOBIN (HGB A1C): HbA1c, POC (controlled diabetic range): 6 % (ref 0.0–7.0)

## 2023-02-27 MED ORDER — VENLAFAXINE HCL ER 37.5 MG PO CP24: 37.5000 mg | ORAL_CAPSULE | Freq: Every day | ORAL | 0 refills | Status: DC

## 2023-02-27 MED ORDER — AMLODIPINE BESYLATE 10 MG PO TABS
10.0000 mg | ORAL_TABLET | Freq: Every day | ORAL | 3 refills | Status: DC
Start: 2023-02-27 — End: 2023-05-25

## 2023-02-27 NOTE — Addendum Note (Signed)
Addended by: Arnetha Courser on: 02/27/2023 01:05 PM   Modules accepted: Orders

## 2023-02-27 NOTE — Progress Notes (Signed)
    SUBJECTIVE:   CHIEF COMPLAINT / HPI:   Patient presents with his sister today.  Sisters expressed extreme concern regarding patient's wellbeing at home.  States that he is unable to complete any ADLs, does not bathe, take his medicines, clean his house, take care of his pets.  States that this has been going on for quite some time, they think that it really started when he was diagnosed with prostate cancer.  No history of dementia but they do well.  He does have a history of severe depression that is untreated.  Patient states that he does not take any medications for his depression because they do not work for him.  Asking about his prostate cancer.  He states that he had a normal time ago, however sister states that he is currently being treated for it.  Patient states that he just has no energy to do anything.  He has not been taking any medications at all he says.  Sisters states that they often thinking about nursing home placement, patient is very reluctant because of his 3 cats at home.  They would also like to start paperwork to become healthcare power of attorney.  Patient states towards the end of the visit that he feels like he has trouble urinating.  States that he is able to produce urine but it is a small amount and he feels like there is hesitation when he pees.  States he gets up throughout the night to go to the bathroom.  PERTINENT  PMH / PSH: Prostate cancer, diabetes, hyperlipidemia, major depressive disorder  OBJECTIVE:   BP (!) 184/94   Pulse 91   Ht 5\' 9"  (1.753 m)   Wt 216 lb 12.8 oz (98.3 kg)   SpO2 99%   BMI 32.02 kg/m   General: Very tearful, no acute distress Neuro: Alert and oriented to person and place.  Does not know the year, month, or date. Psych: Disheveled appearing Bedside postvoid bladder scan normal  MMSE 17  ASSESSMENT/PLAN:   MDD (major depressive disorder), recurrent episode, mild (HCC) Not well-controlled.  It appears he is prescribed  Cymbalta, Seroquel, trazodone, Remeron but does not take any of his medications  It appears that he has tried and failed first-line medication for depression in the past.  I will refer him to psychiatry again.  I also wonder if there is a component of new onset dementia here, vascular changes on his last brain MRI in 2023.  Referral to the [facility] care management placed.  Have also scheduled him with geriatric clinic with Dr. McDiarmid. Will obtain CBC, CMP, TSH, B12, UA to look for any other abnormalities that could have caused change in mental status Close follow-up with myself on 11/15  Type II diabetes mellitus with neurological manifestations, uncontrolled (HCC) Controlled, A1c today of 6.  Not on any medications.  Micro ACR collected today.  Essential hypertension, benign Blood pressure elevated in the clinic today x 2, 190/91 and 184/94 on recheck.  Patient has not taken his blood pressure medications in months.  Refilled these and sending to pharmacy today stressed importance with patient and sister of taking blood pressure medicines, he will start taking his amlodipine again today he says.  Urinary retention Postvoid residual less than 5 mL, no concern for urinary retention at this time     Para March, DO [facility]

## 2023-02-27 NOTE — Assessment & Plan Note (Signed)
Postvoid residual less than 5 mL, no concern for urinary retention at this time

## 2023-02-27 NOTE — Assessment & Plan Note (Signed)
Controlled, A1c today of 6.  Not on any medications.  Micro ACR collected today.

## 2023-02-27 NOTE — Progress Notes (Signed)
Did not add in my prior note prior to attestation, added Effexor 37.5 mg daily.  Will follow-up on this at his next appointment

## 2023-02-27 NOTE — Assessment & Plan Note (Addendum)
Not well-controlled.  It appears he is prescribed Cymbalta, Seroquel, trazodone, Remeron but does not take any of his medications  It appears that he has tried and failed first-line medication for depression in the past.  I will refer him to psychiatry again.  I also wonder if there is a component of new onset dementia here, vascular changes on his last brain MRI in 2023.  Referral to the [facility] care management placed.  Have also scheduled him with geriatric clinic with Dr. McDiarmid. Will obtain CBC, CMP, TSH, B12, UA to look for any other abnormalities that could have caused change in mental status Close follow-up with myself on 11/15

## 2023-02-27 NOTE — Assessment & Plan Note (Signed)
Blood pressure elevated in the clinic today x 2, 190/91 and 184/94 on recheck.  Patient has not taken his blood pressure medications in months.  Refilled these and sending to pharmacy today stressed importance with patient and sister of taking blood pressure medicines, he will start taking his amlodipine again today he says.

## 2023-02-27 NOTE — Patient Instructions (Addendum)
It was great to see you today! Thank you for choosing [facility] for your primary care. Jacob [name] was seen for annual physical exam.  Today we addressed: I have sent in a referral for case management to reach out with you to assist with ADLs.  We will follow-up with this closely in 2 weeks. Please pick up your blood pressure medicine from the pharmacy and start taking it  If you haven't already, sign up for My Chart to have easy access to your labs results, and communication with your primary care physician.  We are checking some labs today. If they are abnormal, I will call you. If they are normal, I will send you a MyChart message (if it is active) or a letter in the mail. If you do not hear about your labs in the next 2 weeks, please call the office.  You should return to our clinic 11/15 for follow up appointment Please arrive 15 minutes before your appointment to ensure smooth check in process.  We appreciate your efforts in making this happen.  Thank you for allowing me to participate in your care, Para March, DO 02/27/2023, 12:32 PM

## 2023-02-28 ENCOUNTER — Telehealth: Payer: Self-pay

## 2023-02-28 NOTE — Telephone Encounter (Signed)
Patients sister calls nurse line reporting she missed a call.   Sister advised of recent lab work results.   Advised FU as scheduled for 11/15.  All questions answered.

## 2023-03-01 ENCOUNTER — Telehealth: Payer: Self-pay | Admitting: *Deleted

## 2023-03-01 LAB — CBC WITH DIFFERENTIAL/PLATELET
Basophils Absolute: 0 10*3/uL (ref 0.0–0.2)
Basos: 1 %
EOS (ABSOLUTE): 0.2 10*3/uL (ref 0.0–0.4)
Eos: 2 %
Hematocrit: 43 % (ref 37.5–51.0)
Hemoglobin: 13.4 g/dL (ref 13.0–17.7)
Immature Grans (Abs): 0.1 10*3/uL (ref 0.0–0.1)
Immature Granulocytes: 1 %
Lymphocytes Absolute: 1.2 10*3/uL (ref 0.7–3.1)
Lymphs: 14 %
MCH: 27.6 pg (ref 26.6–33.0)
MCHC: 31.2 g/dL — ABNORMAL LOW (ref 31.5–35.7)
MCV: 89 fL (ref 79–97)
Monocytes Absolute: 0.6 10*3/uL (ref 0.1–0.9)
Monocytes: 6 %
Neutrophils Absolute: 6.6 10*3/uL (ref 1.4–7.0)
Neutrophils: 76 %
Platelets: 186 10*3/uL (ref 150–450)
RBC: 4.85 x10E6/uL (ref 4.14–5.80)
RDW: 13.3 % (ref 11.6–15.4)
WBC: 8.7 10*3/uL (ref 3.4–10.8)

## 2023-03-01 LAB — COMPREHENSIVE METABOLIC PANEL
ALT: 11 IU/L (ref 0–44)
AST: 11 [IU]/L (ref 0–40)
Albumin: 4 g/dL (ref 3.9–4.9)
Alkaline Phosphatase: 92 IU/L (ref 44–121)
BUN/Creatinine Ratio: 13 (ref 10–24)
BUN: 12 mg/dL (ref 8–27)
Bilirubin Total: <0.2 mg/dL (ref 0.0–1.2)
CO2: 24 mmol/L (ref 20–29)
Calcium: 9.1 mg/dL (ref 8.6–10.2)
Chloride: 105 mmol/L (ref 96–106)
Creatinine, Ser: 0.94 mg/dL (ref 0.76–1.27)
Globulin, Total: 2.9 g/dL (ref 1.5–4.5)
Glucose: 165 mg/dL — ABNORMAL HIGH (ref 70–99)
Potassium: 4.5 mmol/L (ref 3.5–5.2)
Sodium: 143 mmol/L (ref 134–144)
Total Protein: 6.9 g/dL (ref 6.0–8.5)
eGFR: 89 mL/min/{1.73_m2} (ref >59)

## 2023-03-01 LAB — VITAMIN B12: Vitamin B-12: 243 pg/mL (ref 232–1245)

## 2023-03-01 LAB — MICROALBUMIN / CREATININE URINE RATIO
Creatinine, Urine: 155.4 mg/dL
Microalb/Creat Ratio: 14 mg/g{creat} (ref 0–29)
Microalbumin, Urine: 22.3 ug/mL

## 2023-03-01 LAB — URINALYSIS, ROUTINE W REFLEX MICROSCOPIC
Bilirubin, UA: NEGATIVE
Glucose, UA: NEGATIVE
Ketones, UA: NEGATIVE
Leukocytes,UA: NEGATIVE
Nitrite, UA: NEGATIVE
Protein,UA: NEGATIVE
RBC, UA: NEGATIVE
Specific Gravity, UA: 1.019 (ref 1.005–1.030)
Urobilinogen, Ur: 0.2 mg/dL (ref 0.2–1.0)
pH, UA: 5.5 (ref 5.0–7.5)

## 2023-03-01 LAB — TSH RFX ON ABNORMAL TO FREE T4: TSH: 2.88 u[IU]/mL (ref 0.450–4.500)

## 2023-03-01 NOTE — Progress Notes (Signed)
Care Coordination   Note   03/01/2023 Name: Jacob [name] MRN: [id number] DOB: January 08, 1956  Jacob [name] is a 67 y.o. year old male who sees [name], [place] for primary care. I reached out to Jacob [name] by phone today to offer care coordination services.  Mr. Rasner was given information about Care Coordination services today including:   The Care Coordination services include support from the care team which includes your Nurse Coordinator, Clinical Social Worker, or Pharmacist.  The Care Coordination team is here to help remove barriers to the health concerns and goals most important to you. Care Coordination services are voluntary, and the patient may decline or stop services at any time by request to their care team member.   Care Coordination Consent Status: Patient agreed to services and verbal consent obtained.   Follow up plan:  Telephone appointment with care coordination team member scheduled for:  03/06/23  Encounter Outcome:  Patient Scheduled  [facility] Coordination Care Guide  Direct Dial: [phone]

## 2023-03-06 ENCOUNTER — Ambulatory Visit: Payer: 59 | Admitting: Student

## 2023-03-06 ENCOUNTER — Ambulatory Visit: Payer: Self-pay | Admitting: Licensed Clinical Social Worker

## 2023-03-06 NOTE — Patient Outreach (Signed)
  Care Coordination   Initial Visit Note   03/06/2023 Name: Jacob [name] MRN: [id number] DOB: Oct 01, 1955  Jacob [name] is a 67 y.o. year old male who sees Jacob [name], [place] for primary care. I spoke with patient's sister Jacob [name] by phone today.  What matters to the patients health and wellness today?  Ms. Jacob [name] and LCSW A Jacob [name] discussed resources to assist Jacob [name] with activities of daily living.     Goals Addressed             This Visit's Progress    Care Coordination       Activities and task to complete in order to accomplish goals.     Call to inquire about the PACE program [phone]  [facility] location) In-Home Aide program at [facility]-call to be placed on wait list [phone] Complete Advance Directive packet,  Have advance directive notarized and provide a copy to provider office  I have placed a referral with [employer], you understand this is a long wait list, they will reach out to you. Feel free to follow up with them at [phone]         SDOH assessments and interventions completed:  Yes  SDOH Interventions Today    Flowsheet Row Most Recent Value  SDOH Interventions   Food Insecurity Interventions Intervention Not Indicated  Housing Interventions Intervention Not Indicated  Utilities Interventions Intervention Not Indicated        Care Coordination Interventions:  Yes, provided   Interventions Today    Flowsheet Row Most Recent Value  Chronic Disease   Chronic disease during today's visit Other  [pt dx with dementia 02/27/2023 and prostate cancer approx one year ago.]  General Interventions   General Interventions Discussed/Reviewed Level of Care  Level of Care Personal Care Services, Skilled Nursing Facility  [pt sister reports interest in nursing facility for Jacob [name]. Ms. Jacob [name] reports Jacob [name] is not taking care of himself,  he sleeps most days, forgets to take his medicine and pt cat's  are not well taken care of.]  Education Interventions   Education Provided Provided Education  [Ms. Jacob [name] reports concern for Jacob [name] cats. I advised Jacob [name] to discuss alternate options with Jacob [name] such as animal rescue vs animal shelter.]  Provided Verbal Education On [employer], Nutrition  Mental Health Interventions   Mental Health Discussed/Reviewed --  [Jacob [name] has a history of depression. Jacob [name] is not currently in therapy. LCSW A. Jacob [name] offered counseling resources, Ms. Jacob [name] reports pt is not interested.]  Nutrition Interventions   Nutrition Discussed/Reviewed Nutrition Discussed  [Ms. Jacob [name] reports she delivers groceries to pt. LCSW A. Jacob [name] provided information fo r[facility]als on wheels program [phone].]  Advanced Directive Interventions   Advanced Directives Discussed/Reviewed Provided resource for acquiring and filling out documents  [Advanced directive packet mailed to patient sister. Advised Jacob [name] to discuss applying for medicaid with Jacob [name].]        Follow up plan: Follow up call scheduled for 03/20/2023    Encounter Outcome:  Patient Visit Completed   Gwyndolyn Saxon MSW, LCSW Licensed Clinical Social Worker  [facility], Population Health Direct Dial: [phone]  Fax: [phone]

## 2023-03-07 NOTE — Progress Notes (Signed)
RN left message with Jacob [name], and his sister, [name] to follow up prior to upcoming appointments with urology.

## 2023-03-17 ENCOUNTER — Ambulatory Visit: Payer: Self-pay | Admitting: Family Medicine

## 2023-03-20 ENCOUNTER — Ambulatory Visit: Payer: Self-pay | Admitting: Licensed Clinical Social Worker

## 2023-03-21 ENCOUNTER — Other Ambulatory Visit: Payer: Self-pay | Admitting: Family Medicine

## 2023-03-21 NOTE — Patient Outreach (Signed)
  Care Coordination   Follow Up Visit Note   03/21/2023 Name: Jacob [name] MRN: [id number] DOB: 01/03/1956  Jacob [name] is a 67 y.o. year old male who sees Jacob [name], [place] for primary care. I spoke with  Jacob [name] by phone today.  What matters to the patients health and wellness today?  LCSW A. Felton Clinton and Jacob [name] pt sister completed follow up .     Goals Addressed             This Visit's Progress    Care Coordination       Activities and task to complete in order to accomplish goals.     Call to inquire about the PACE program [phone]  [facility] location) In-Home Aide program at [facility] [employer] [facility]-call to be placed on wait list [phone] Complete Advance Directive packet,  Have advance directive notarized and provide a copy to provider office  I have placed a referral with [facility], [name] understand this is a long wait list, they will reach out to [name]. Feel free to follow up with them at [phone]  Create caregiver plan with Mr. Devall         SDOH assessments and interventions completed:  Yes     Care Coordination Interventions:  No, not indicated      Follow up plan: Follow up call scheduled for 04/06/2023    Encounter Outcome:  Patient Visit Completed   Gwyndolyn Saxon MSW, LCSW Licensed Clinical Social Worker  [facility], Population Health Direct Dial: [phone]  Fax: [phone]

## 2023-03-23 ENCOUNTER — Ambulatory Visit: Payer: 59 | Admitting: Family Medicine

## 2023-04-04 ENCOUNTER — Other Ambulatory Visit: Payer: Self-pay

## 2023-04-04 ENCOUNTER — Emergency Department (HOSPITAL_COMMUNITY): Payer: 59

## 2023-04-04 ENCOUNTER — Inpatient Hospital Stay (HOSPITAL_COMMUNITY)
Admission: EM | Admit: 2023-04-04 | Discharge: 2023-04-11 | DRG: 065 | Disposition: A | Discharge status: Skilled Nursing Facility | Payer: 59 | Attending: Family Medicine | Admitting: Family Medicine

## 2023-04-04 ENCOUNTER — Encounter (HOSPITAL_COMMUNITY): Payer: Self-pay

## 2023-04-04 DIAGNOSIS — Z87442 Personal history of urinary calculi: Secondary | ICD-10-CM

## 2023-04-04 DIAGNOSIS — R270 Ataxia, unspecified: Secondary | ICD-10-CM | POA: Diagnosis present

## 2023-04-04 DIAGNOSIS — I6389 Other cerebral infarction: Principal | ICD-10-CM | POA: Diagnosis present

## 2023-04-04 DIAGNOSIS — R471 Dysarthria and anarthria: Secondary | ICD-10-CM | POA: Diagnosis present

## 2023-04-04 DIAGNOSIS — R531 Weakness: Secondary | ICD-10-CM | POA: Diagnosis not present

## 2023-04-04 DIAGNOSIS — E876 Hypokalemia: Secondary | ICD-10-CM | POA: Diagnosis not present

## 2023-04-04 DIAGNOSIS — I6381 Other cerebral infarction due to occlusion or stenosis of small artery: Secondary | ICD-10-CM | POA: Diagnosis not present

## 2023-04-04 DIAGNOSIS — E782 Mixed hyperlipidemia: Secondary | ICD-10-CM | POA: Diagnosis not present

## 2023-04-04 DIAGNOSIS — G4733 Obstructive sleep apnea (adult) (pediatric): Secondary | ICD-10-CM | POA: Diagnosis not present

## 2023-04-04 DIAGNOSIS — M109 Gout, unspecified: Secondary | ICD-10-CM | POA: Diagnosis present

## 2023-04-04 DIAGNOSIS — R110 Nausea: Secondary | ICD-10-CM | POA: Diagnosis not present

## 2023-04-04 DIAGNOSIS — R569 Unspecified convulsions: Secondary | ICD-10-CM | POA: Diagnosis not present

## 2023-04-04 DIAGNOSIS — R404 Transient alteration of awareness: Secondary | ICD-10-CM | POA: Diagnosis not present

## 2023-04-04 DIAGNOSIS — Z23 Encounter for immunization: Secondary | ICD-10-CM | POA: Diagnosis not present

## 2023-04-04 DIAGNOSIS — R29713 NIHSS score 13: Secondary | ICD-10-CM | POA: Diagnosis not present

## 2023-04-04 DIAGNOSIS — R55 Syncope and collapse: Secondary | ICD-10-CM

## 2023-04-04 DIAGNOSIS — G8929 Other chronic pain: Secondary | ICD-10-CM | POA: Diagnosis present

## 2023-04-04 DIAGNOSIS — Z823 Family history of stroke: Secondary | ICD-10-CM

## 2023-04-04 DIAGNOSIS — F419 Anxiety disorder, unspecified: Secondary | ICD-10-CM | POA: Diagnosis present

## 2023-04-04 DIAGNOSIS — E7800 Pure hypercholesterolemia, unspecified: Secondary | ICD-10-CM | POA: Diagnosis present

## 2023-04-04 DIAGNOSIS — Z9841 Cataract extraction status, right eye: Secondary | ICD-10-CM

## 2023-04-04 DIAGNOSIS — E1140 Type 2 diabetes mellitus with diabetic neuropathy, unspecified: Secondary | ICD-10-CM | POA: Diagnosis not present

## 2023-04-04 DIAGNOSIS — C61 Malignant neoplasm of prostate: Secondary | ICD-10-CM | POA: Diagnosis present

## 2023-04-04 DIAGNOSIS — R4701 Aphasia: Secondary | ICD-10-CM | POA: Diagnosis present

## 2023-04-04 DIAGNOSIS — E1165 Type 2 diabetes mellitus with hyperglycemia: Secondary | ICD-10-CM | POA: Diagnosis not present

## 2023-04-04 DIAGNOSIS — F0394 Unspecified dementia, unspecified severity, with anxiety: Secondary | ICD-10-CM | POA: Diagnosis present

## 2023-04-04 DIAGNOSIS — I6782 Cerebral ischemia: Secondary | ICD-10-CM | POA: Diagnosis not present

## 2023-04-04 DIAGNOSIS — Z888 Allergy status to other drugs, medicaments and biological substances status: Secondary | ICD-10-CM | POA: Diagnosis not present

## 2023-04-04 DIAGNOSIS — K219 Gastro-esophageal reflux disease without esophagitis: Secondary | ICD-10-CM | POA: Diagnosis present

## 2023-04-04 DIAGNOSIS — R4781 Slurred speech: Secondary | ICD-10-CM | POA: Diagnosis not present

## 2023-04-04 DIAGNOSIS — F0393 Unspecified dementia, unspecified severity, with mood disturbance: Secondary | ICD-10-CM | POA: Diagnosis present

## 2023-04-04 DIAGNOSIS — Z9049 Acquired absence of other specified parts of digestive tract: Secondary | ICD-10-CM

## 2023-04-04 DIAGNOSIS — K439 Ventral hernia without obstruction or gangrene: Secondary | ICD-10-CM | POA: Diagnosis present

## 2023-04-04 DIAGNOSIS — Z961 Presence of intraocular lens: Secondary | ICD-10-CM | POA: Diagnosis present

## 2023-04-04 DIAGNOSIS — I672 Cerebral atherosclerosis: Secondary | ICD-10-CM | POA: Diagnosis not present

## 2023-04-04 DIAGNOSIS — Z79899 Other long term (current) drug therapy: Secondary | ICD-10-CM

## 2023-04-04 DIAGNOSIS — G8191 Hemiplegia, unspecified affecting right dominant side: Secondary | ICD-10-CM | POA: Diagnosis present

## 2023-04-04 DIAGNOSIS — I10 Essential (primary) hypertension: Secondary | ICD-10-CM | POA: Diagnosis present

## 2023-04-04 DIAGNOSIS — I499 Cardiac arrhythmia, unspecified: Secondary | ICD-10-CM | POA: Diagnosis not present

## 2023-04-04 DIAGNOSIS — I6523 Occlusion and stenosis of bilateral carotid arteries: Secondary | ICD-10-CM | POA: Diagnosis not present

## 2023-04-04 DIAGNOSIS — Z83438 Family history of other disorder of lipoprotein metabolism and other lipidemia: Secondary | ICD-10-CM

## 2023-04-04 DIAGNOSIS — Z9842 Cataract extraction status, left eye: Secondary | ICD-10-CM

## 2023-04-04 DIAGNOSIS — H5500 Unspecified nystagmus: Secondary | ICD-10-CM | POA: Diagnosis present

## 2023-04-04 DIAGNOSIS — E119 Type 2 diabetes mellitus without complications: Secondary | ICD-10-CM

## 2023-04-04 DIAGNOSIS — R9089 Other abnormal findings on diagnostic imaging of central nervous system: Secondary | ICD-10-CM | POA: Diagnosis not present

## 2023-04-04 DIAGNOSIS — G459 Transient cerebral ischemic attack, unspecified: Secondary | ICD-10-CM | POA: Diagnosis present

## 2023-04-04 DIAGNOSIS — Z751 Person awaiting admission to adequate facility elsewhere: Secondary | ICD-10-CM

## 2023-04-04 DIAGNOSIS — F32A Depression, unspecified: Secondary | ICD-10-CM | POA: Diagnosis present

## 2023-04-04 DIAGNOSIS — Z860100 Personal history of colon polyps, unspecified: Secondary | ICD-10-CM

## 2023-04-04 DIAGNOSIS — R109 Unspecified abdominal pain: Secondary | ICD-10-CM | POA: Insufficient documentation

## 2023-04-04 DIAGNOSIS — I639 Cerebral infarction, unspecified: Secondary | ICD-10-CM | POA: Diagnosis present

## 2023-04-04 DIAGNOSIS — Z923 Personal history of irradiation: Secondary | ICD-10-CM

## 2023-04-04 DIAGNOSIS — R300 Dysuria: Secondary | ICD-10-CM | POA: Diagnosis present

## 2023-04-04 DIAGNOSIS — Z833 Family history of diabetes mellitus: Secondary | ICD-10-CM

## 2023-04-04 DIAGNOSIS — Z8673 Personal history of transient ischemic attack (TIA), and cerebral infarction without residual deficits: Secondary | ICD-10-CM | POA: Diagnosis not present

## 2023-04-04 DIAGNOSIS — R1310 Dysphagia, unspecified: Secondary | ICD-10-CM | POA: Diagnosis present

## 2023-04-04 DIAGNOSIS — I69320 Aphasia following cerebral infarction: Secondary | ICD-10-CM | POA: Diagnosis not present

## 2023-04-04 DIAGNOSIS — R29818 Other symptoms and signs involving the nervous system: Secondary | ICD-10-CM | POA: Diagnosis not present

## 2023-04-04 DIAGNOSIS — F0390 Unspecified dementia without behavioral disturbance: Secondary | ICD-10-CM | POA: Insufficient documentation

## 2023-04-04 DIAGNOSIS — Z471 Aftercare following joint replacement surgery: Secondary | ICD-10-CM | POA: Diagnosis not present

## 2023-04-04 LAB — CBC
HCT: 36.4 % — ABNORMAL LOW (ref 39.0–52.0)
Hemoglobin: 12.4 g/dL — ABNORMAL LOW (ref 13.0–17.0)
MCH: 28 pg (ref 26.0–34.0)
MCHC: 34.1 g/dL (ref 30.0–36.0)
MCV: 82.2 fL (ref 80.0–100.0)
Platelets: 158 10*3/uL (ref 150–400)
RBC: 4.43 MIL/uL (ref 4.22–5.81)
RDW: 13.2 % (ref 11.5–15.5)
WBC: 9.2 10*3/uL (ref 4.0–10.5)
nRBC: 0 % (ref 0.0–0.2)

## 2023-04-04 LAB — COMPREHENSIVE METABOLIC PANEL
ALT: 16 U/L (ref 0–44)
AST: 15 U/L (ref 15–41)
Albumin: 3.3 g/dL — ABNORMAL LOW (ref 3.5–5.0)
Alkaline Phosphatase: 66 U/L (ref 38–126)
Anion gap: 10 (ref 5–15)
BUN: 13 mg/dL (ref 8–23)
CO2: 24 mmol/L (ref 22–32)
Calcium: 8.6 mg/dL — ABNORMAL LOW (ref 8.9–10.3)
Chloride: 104 mmol/L (ref 98–111)
Creatinine, Ser: 1.14 mg/dL (ref 0.61–1.24)
GFR, Estimated: >60 mL/min (ref >60)
Glucose, Bld: 210 mg/dL — ABNORMAL HIGH (ref 70–99)
Potassium: 3.3 mmol/L — ABNORMAL LOW (ref 3.5–5.1)
Sodium: 138 mmol/L (ref 135–145)
Total Bilirubin: 0.8 mg/dL (ref <1.2)
Total Protein: 7 g/dL (ref 6.5–8.1)

## 2023-04-04 LAB — DIFFERENTIAL
Abs Immature Granulocytes: 0.11 10*3/uL — ABNORMAL HIGH (ref 0.00–0.07)
Basophils Absolute: 0 10*3/uL (ref 0.0–0.1)
Basophils Relative: 0 %
Eosinophils Absolute: 0.1 10*3/uL (ref 0.0–0.5)
Eosinophils Relative: 1 %
Immature Granulocytes: 1 %
Lymphocytes Relative: 18 %
Lymphs Abs: 1.6 10*3/uL (ref 0.7–4.0)
Monocytes Absolute: 0.5 10*3/uL (ref 0.1–1.0)
Monocytes Relative: 5 %
Neutro Abs: 6.8 10*3/uL (ref 1.7–7.7)
Neutrophils Relative %: 75 %

## 2023-04-04 LAB — HEMOGLOBIN A1C
Hgb A1c MFr Bld: 6.7 % — ABNORMAL HIGH (ref 4.8–5.6)
Mean Plasma Glucose: 145.59 mg/dL

## 2023-04-04 LAB — APTT: aPTT: 27 s (ref 24–36)

## 2023-04-04 LAB — CBG MONITORING, ED: Glucose-Capillary: 179 mg/dL — ABNORMAL HIGH (ref 70–99)

## 2023-04-04 LAB — I-STAT CHEM 8, ED
BUN: 15 mg/dL (ref 8–23)
Calcium, Ion: 1.05 mmol/L — ABNORMAL LOW (ref 1.15–1.40)
Chloride: 103 mmol/L (ref 98–111)
Creatinine, Ser: 1.1 mg/dL (ref 0.61–1.24)
Glucose, Bld: 212 mg/dL — ABNORMAL HIGH (ref 70–99)
HCT: 37 % — ABNORMAL LOW (ref 39.0–52.0)
Hemoglobin: 12.6 g/dL — ABNORMAL LOW (ref 13.0–17.0)
Potassium: 3.4 mmol/L — ABNORMAL LOW (ref 3.5–5.1)
Sodium: 139 mmol/L (ref 135–145)
TCO2: 22 mmol/L (ref 22–32)

## 2023-04-04 LAB — LIPID PANEL
Cholesterol: 194 mg/dL (ref 0–200)
HDL: 37 mg/dL — ABNORMAL LOW (ref >40)
LDL Cholesterol: 106 mg/dL — ABNORMAL HIGH (ref 0–99)
Total CHOL/HDL Ratio: 5.2 {ratio}
Triglycerides: 254 mg/dL — ABNORMAL HIGH (ref <150)
VLDL: 51 mg/dL — ABNORMAL HIGH (ref 0–40)

## 2023-04-04 LAB — PROTIME-INR
INR: 1 (ref 0.8–1.2)
Prothrombin Time: 13.8 s (ref 11.4–15.2)

## 2023-04-04 LAB — ETHANOL: Alcohol, Ethyl (B): <10 mg/dL (ref <10)

## 2023-04-04 MED ORDER — POTASSIUM CHLORIDE 10 MEQ/100ML IV SOLN
10.0000 meq | INTRAVENOUS | Status: DC
  Administered 2023-04-04 – 2023-04-05 (×3): 10 meq via INTRAVENOUS
  Filled 2023-04-04 (×3): qty 100

## 2023-04-04 MED ORDER — SODIUM CHLORIDE 0.9% FLUSH
3.0000 mL | Freq: Once | INTRAVENOUS | Status: AC
  Administered 2023-04-04: 3 mL via INTRAVENOUS

## 2023-04-04 MED ORDER — IOHEXOL 350 MG/ML SOLN
100.0000 mL | Freq: Once | INTRAVENOUS | Status: AC | PRN
Start: 2023-04-04 — End: 2023-04-04
  Administered 2023-04-04: 100 mL via INTRAVENOUS

## 2023-04-04 MED ORDER — ASPIRIN 325 MG PO TABS
325.0000 mg | ORAL_TABLET | Freq: Every day | ORAL | Status: DC
  Administered 2023-04-04 – 2023-04-05 (×2): 325 mg via ORAL
  Filled 2023-04-04 (×2): qty 1

## 2023-04-04 MED ORDER — ENOXAPARIN SODIUM 40 MG/0.4ML IJ SOSY
40.0000 mg | PREFILLED_SYRINGE | INTRAMUSCULAR | Status: DC
Start: 2023-04-04 — End: 2023-04-11
  Administered 2023-04-04 – 2023-04-10 (×7): 40 mg via SUBCUTANEOUS
  Filled 2023-04-04 (×7): qty 0.4

## 2023-04-04 MED ORDER — INFLUENZA VAC A&B SURF ANT ADJ 0.5 ML IM SUSY
0.5000 mL | PREFILLED_SYRINGE | INTRAMUSCULAR | Status: AC
  Administered 2023-04-05: 0.5 mL via INTRAMUSCULAR
  Filled 2023-04-04: qty 0.5

## 2023-04-04 NOTE — ED Provider Notes (Signed)
[place] EMERGENCY DEPARTMENT AT [facility] Provider Note   CSN: [id number] Arrival date & time: 04/04/23  1512  An emergency department physician performed an initial assessment on this suspected stroke patient at 1514.  History  Chief Complaint  Patient presents with   Code Stroke    Jacob [name] is a 67 y.o. male hx  Dementia, diabetes, ACS here for evaluation of possible syncopal episode.  On EMS arrival patient was found to have rightward gaze with nystagmus.  He apparently had pinpoint pupils which did not respond to Narcan.  He was noted to be aphasic with right-sided weakness and sonorous respirations.  Sounds like at baseline lives alone however his sister checks on him.  Sister states patient has had gradual decline.  She did see him yesterday and was concerned as he had some slurred speech however he did not want to seek care at that time.  On arrival to ED he was protecting his airway however aphasic.  Mental status had improved with EMS.  He was initially called as a code stroke due to possible last known normal 2 hours PTA however with sisters history his last normal was greater than 24 hours ago  HPI     Home Medications Prior to Admission medications   Medication Sig Start Date End Date Taking? Authorizing Provider  amLODipine (NORVASC) 10 MG tablet Take 1 tablet (10 mg total) by mouth daily. 02/27/23   Everhart, Kirstie, DO  icosapent Ethyl (VASCEPA) 1 g capsule Take 2 capsules (2 g total) by mouth 2 (two) times daily. Patient not taking: Reported on 10/02/2022 01/25/22   Marcelino Duster, PA  QUEtiapine (SEROQUEL XR) 50 MG TB24 24 hr tablet SMARTSIG:1 Tablet(s) By Mouth Every Evening 11/02/22   [provider]  rosuvastatin (CRESTOR) 40 MG tablet Take 1 tablet (40 mg total) by mouth daily. 01/25/22   Marcelino Duster, PA  venlafaxine XR (EFFEXOR-XR) 37.5 MG 24 hr capsule TAKE 1 CAPSULE BY MOUTH DAILY WITH BREAKFAST. 03/22/23   [name], DO      Allergies    Methylprednisolone sodium succ and Ms contin [morphine]    Review of Systems   Review of Systems  Unable to perform ROS: Mental status change    Physical Exam Updated Vital Signs BP 132/85   Pulse 94   Temp (!) 97.3 F (36.3 C) (Oral)   Resp 18   Ht 5\' 9"  (1.753 m)   Wt 79.8 kg   SpO2 99%   BMI 25.99 kg/m  Physical Exam Vitals and nursing note reviewed.  Constitutional:      General: [name] not in acute distress.    Appearance: [name] well-developed. [name] not ill-appearing, toxic-appearing or diaphoretic.  HENT:     Head: Normocephalic and atraumatic.     Mouth/Throat:     Mouth: Mucous membranes are dry.  Eyes:     Pupils: Pupils are equal, round, and reactive to light.  Cardiovascular:     Rate and Rhythm: Normal rate and regular rhythm.     Pulses: Normal pulses.          Radial pulses are 2+ on the right side and 2+ on the left side.       Dorsalis pedis pulses are 2+ on the right side and 2+ on the left side.     Heart sounds: Normal heart sounds.  Pulmonary:     Effort: Pulmonary effort is normal. No respiratory distress.  Breath sounds: Normal breath sounds.  Abdominal:     General: Bowel sounds are normal. There is no distension.     Palpations: Abdomen is soft.     Tenderness: There is no abdominal tenderness. There is no guarding or rebound.     Hernia: No hernia is present.  Musculoskeletal:        General: Normal range of motion.     Cervical back: Normal range of motion and neck supple.     Comments: Refuses to remove pants for skin exam  Skin:    General: Skin is warm and dry.  Neurological:     Mental Status: [name] alert.     Cranial Nerves: Cranial nerve deficit present.     Sensory: Sensory deficit present.     Motor: Weakness present.     Gait: Gait abnormal.     Comments: Right facial droop with downturned right lip Decreased strength to RUE and RLE Speech delayed however able to answers questions      ED Results / Procedures / Treatments   Labs (all labs ordered are listed, but only abnormal results are displayed) Labs Reviewed  CBC - Abnormal; Notable for the following components:      Result Value   Hemoglobin 12.4 (*)    HCT 36.4 (*)    All other components within normal limits  DIFFERENTIAL - Abnormal; Notable for the following components:   Abs Immature Granulocytes 0.11 (*)    All other components within normal limits  COMPREHENSIVE METABOLIC PANEL - Abnormal; Notable for the following components:   Potassium 3.3 (*)    Glucose, Bld 210 (*)    Calcium 8.6 (*)    Albumin 3.3 (*)    All other components within normal limits  I-STAT CHEM 8, ED - Abnormal; Notable for the following components:   Potassium 3.4 (*)    Glucose, Bld 212 (*)    Calcium, Ion 1.05 (*)    Hemoglobin 12.6 (*)    HCT 37.0 (*)    All other components within normal limits  CBG MONITORING, ED - Abnormal; Notable for the following components:   Glucose-Capillary 179 (*)    All other components within normal limits  PROTIME-INR  APTT  ETHANOL  URINALYSIS, W/ REFLEX TO CULTURE (INFECTION SUSPECTED)  HEMOGLOBIN A1C  LIPID PANEL  RAPID URINE DRUG SCREEN, HOSP PERFORMED    EKG None  Radiology CT ANGIO HEAD NECK W WO CM W PERF (CODE STROKE)  Result Date: 04/04/2023 CLINICAL DATA:  Neuro deficit, acute, stroke suspected. Right-sided weakness. EXAM: CT ANGIOGRAPHY HEAD AND NECK CT PERFUSION BRAIN TECHNIQUE: Multidetector CT imaging of the head and neck was performed using the standard protocol during bolus administration of intravenous contrast. Multiplanar CT image reconstructions and MIPs were obtained to evaluate the vascular anatomy. Carotid stenosis measurements (when applicable) are obtained utilizing NASCET criteria, using the distal internal carotid diameter as the denominator. Multiphase CT imaging of the brain was performed following IV bolus contrast injection. Subsequent parametric  perfusion maps were calculated using RAPID software. RADIATION DOSE REDUCTION: This exam was performed according to the departmental dose-optimization program which includes automated exposure control, adjustment of the mA and/or kV according to patient size and/or use of iterative reconstruction technique. CONTRAST:  OMNIPAQUE IOHEXOL 350 MG/ML SOLN COMPARISON:  None Available. FINDINGS: CTA NECK FINDINGS Aortic arch: Standard 3 vessel aortic arch with mild atherosclerosis. Widely patent arch vessel origins. Right carotid system: Patent with a small amount calcified and soft  plaque at the carotid bifurcation. No evidence of a significant stenosis or dissection. Left carotid system: Patent with a small amount of calcified and soft plaque at the carotid bifurcation. No evidence of a significant stenosis or dissection. Vertebral arteries: Patent and codominant. Limited assessment of the left vertebral origin due to artifact with an underlying stenosis not excluded. Skeleton: Advanced disc degeneration in the lower cervical spine. Advanced facet arthrosis on the left at C3-4 and on the right at C4-5. Other neck: No evidence of cervical lymphadenopathy or mass. Prominent fatty atrophy of the right submandibular gland. Upper chest: Clear lung apices. Review of the MIP images confirms the above findings CTA HEAD FINDINGS Anterior circulation: The internal carotid arteries are patent from skull base to carotid termini with mild atherosclerosis bilaterally not resulting in significant stenosis. ACAs and MCAs are patent with distal branch vessel atherosclerotic irregularity but no evidence of a proximal branch occlusion or significant proximal stenosis. No aneurysm is identified. Posterior circulation: The intracranial vertebral arteries are patent to the basilar with atherosclerosis resulting in moderate right and severe left V4 stenoses. The basilar artery is widely patent with an incidental fenestration noted  proximally. Patent right PICA, left AICA, and bilateral SCA origins are visualized. There are moderate right and large left posterior communicating arteries with absence of the left P1 segment. The PCAs are patent with severe right and mild left proximal P2 stenoses. No aneurysm is identified. Venous sinuses: As permitted by contrast timing, patent. Anatomic variants: Fetal left PCA. Review of the MIP images confirms the above findings CT Brain Perfusion Findings: ASPECTS: 10 CBF (<30%) Volume: 0 mL Perfusion (Tmax>6.0s) volume: 0 mL Mismatch Volume: 0 mL Infarction Location: No acute infarct identified by CTP Preliminary emergent findings were discussed by telephone with Dr. Iver Nestle on 04/04/2023 at 3:36 p.m. IMPRESSION: 1. No large vessel occlusion. 2. Intracranial atherosclerosis including moderate right and severe left V4 stenoses and a severe right P2 stenosis. 3. Mild cervical carotid atherosclerosis without stenosis. 4. No evidence of an acute infarct or penumbra on CTP. 5.  Aortic Atherosclerosis (ICD10-I70.0). Electronically Signed   By: Sebastian Ache M.D.   On: 04/04/2023 15:50   CT HEAD CODE STROKE WO CONTRAST  Result Date: 04/04/2023 CLINICAL DATA:  Code stroke.  Neuro deficit, acute, stroke suspected EXAM: CT HEAD WITHOUT CONTRAST TECHNIQUE: Contiguous axial images were obtained from the base of the skull through the vertex without intravenous contrast. RADIATION DOSE REDUCTION: This exam was performed according to the departmental dose-optimization program which includes automated exposure control, adjustment of the mA and/or kV according to patient size and/or use of iterative reconstruction technique. COMPARISON:  03/15/22 Head CT FINDINGS: Brain: Hemorrhage. No hydrocephalus. No extra-axial fluid collection. No mass effect. No mass lesion. 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 No CT evidence of an acute  cortical infarct Vascular: No hyperdense vessel or unexpected calcification. Skull: Normal. Negative for fracture or focal lesion. Sinuses/Orbits: No middle ear or mastoid effusion. Paranasal sinuses are notable for mucosal thickening in the right ethmoid sinus. Bilateral lens replacement. Orbits are otherwise unremarkable. Other: None. ASPECTS [facility] Stroke Program Early CT Score): 10 when accounting for chronic findings IMPRESSION: 1. No hemorrhage or CT evidence of an acute cortical infarct. 2. 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 Findings were paged to Dr. Iver Nestle on 04/04/2023 at 3:25 p.m. Electronically Signed   By:  Lorenza Cambridge M.D.   On: 04/04/2023 15:25    Procedures .Critical Care  Performed by: Linwood Dibbles, PA-C Authorized by: Linwood Dibbles, PA-C   Critical care provider statement:    Critical care time (minutes):  30   Critical care was necessary to treat or prevent imminent or life-threatening deterioration of the following conditions:  CNS failure or compromise   Critical care was time spent personally by me on the following activities:  Development of treatment plan with patient or surrogate, discussions with consultants, evaluation of patient's response to treatment, examination of patient, ordering and review of laboratory studies, ordering and review of radiographic studies, ordering and performing treatments and interventions, pulse oximetry, re-evaluation of patient's condition and review of old charts     Medications Ordered in ED Medications  aspirin tablet 325 mg (325 mg Oral Given 04/04/23 1806)  sodium chloride flush (NS) 0.9 % injection 3 mL (3 mLs Intravenous Given 04/04/23 1806)  iohexol (OMNIPAQUE) 350 MG/ML injection 100 mL (100 mLs Intravenous Contrast Given 04/04/23 1526)   ED Course/ Medical Decision Making/ A&P Clinical Course as of 04/04/23 1825  Tue Apr 04, 2023  1547 Slurred  speech yesterday, did not fully improve. Thought LKN was 1330 per MES, code stroke called per nursing. Here with slurred speech and right sided def, right facial droop/ Improving currently. Poor historian patient and family [BH]  1750 FM to see for admission [BH]    Clinical Course User Index [BH] Nema Oatley A, PA-C   67 year old with multiple medical problems here for evaluation under code stroke.  Was at urology office earlier today when had a possible syncopal episode in the waiting room when EMS arrived he was noted to have sonorous respirations, aphasic with right-sided weakness and unilateral gaze.  Nonrebreather was placed.  Symptoms improved with transport to the emergency department on arrival he was protecting his airway however still had right-sided weakness facial droop.  Neurology, Dr. Iver Nestle and team saw patient at bridge.  He was immediately taken to the CT scanner after airway was cleared.  CT scan did not show any significant abnormality.  By the time he arrived to the room he symptoms had significantly improved.  He was not given TNK due to symptoms greater than 24 hours ago as his last known well.  Labs and imaging personally viewed and interpreted:  CBC without leukocytosis Metabolic panel potassium 3.3, glucose 210 Ethanol less than 10 INR 1.0 CT head without acute findings does show chronic micro vascular ischemic changes with chronic infarct   Reassessed patient at bedside.  He states his symptoms have greatly improved.  He does admit to some depression with his prostate CA however he states this is chronic.  He denies any worsening depression.  He denies any SI, HI, AVH.  He denies any intentional overdose.  He denies any recent falls or injuries. Discussed his workup thus far.  [name] agreeable for inpatient admission for further workup.  See Neurology note, recommend MRI, further admission for CVA workup.  CONSULT with medicine team who will evaluate patient for  admission  The patient appears reasonably stabilized for admission considering the current resources, flow, and capabilities available in the ED at this time, and I doubt any other [facility] requiring further screening and/or treatment in the ED prior to admission.  Medical Decision Making Amount and/or Complexity of Data Reviewed Independent Historian: EMS    Details: family External Data Reviewed: labs, radiology, ECG and notes. Labs: ordered. Decision-making details documented in ED Course. Radiology: ordered and independent interpretation performed. Decision-making details documented in ED Course. ECG/medicine tests: ordered and independent interpretation performed. Decision-making details documented in ED Course.  Risk OTC drugs. Prescription drug management. Parenteral controlled substances. Decision regarding hospitalization. Diagnosis or treatment significantly limited by social determinants of health.           Final Clinical Impression(s) / ED Diagnoses Final diagnoses:  Right sided weakness  Aphasia    Rx / DC Orders ED Discharge Orders     None         Greta Yung A, PA-C 04/04/23 1826    Benjiman Core, MD 04/04/23 2220

## 2023-04-04 NOTE — ED Triage Notes (Signed)
PT BIB [facility] after PT experienced syncopal episode while at their urologist at 1340. PT family states they began noticing slurred speech 1-2 days prior. PT has hx early dementia. R sided deviation and deficits noted by EMS. O2 in 80's with EMS, NPA and NRB given at 15L. Pressure 124/72, HR 95, 223 CBG per [facility]. PT able to follow verbal commands, not oriented to time or place.GCS 14.

## 2023-04-04 NOTE — Plan of Care (Signed)
FMTS Brief Progress Note  S: Patient resting comfortably in bed on exam this evening.  Reports that he is having some urinary pain and hesitancy.  Reports that has been ongoing for some time, likely about 3 weeks.  Otherwise he has no complaints.  O: BP 132/85   Pulse 94   Temp (!) 97.3 F (36.3 C) (Oral)   Resp 18   Ht 5\' 9"  (1.753 m)   Wt 79.8 kg   SpO2 99%   BMI 25.99 kg/m   General: Alert and oriented to self and situation.  No apparent distress Cardiac: RRR no M/R/G Respiratory CTAB, no increased work of breathing. Neuro: A&O x 2.  No facial droop, bilateral strength in the upper and lower extremities intact.  MRI Brain wo contrast  IMPRESSION: Multiple small vessel infarcts in multiple areas, including the L basal ganglia/corona radiata, L posterior temporal cortex and posterior L pons. No signs of acute hemorrhage.   A/P: Ordered UA to investigate possible urinary tract infection given patient's symptoms.  Otherwise patient is stable at the present time. - Orders reviewed. Labs for AM ordered, which was adjusted as needed.  - If condition changes, plan includes reassessment.   Gerrit Heck, DO 04/04/2023, 7:23 PM PGY-1, [place] [facility] Night Resident  Please page [phone] with questions.

## 2023-04-04 NOTE — H&P (Signed)
Hospital Admission History and Physical Service Pager: [phone]  Patient name: DESMON VICKNAIR Medical record number: [id number] Date of Birth: 1955/05/13 Age: 67 y.o. Gender: male  Primary Care Provider: [name], DO Consultants: Neurology Code Status: Full, confirmed with patient Preferred Emergency Contact:  Contact Information     Name Relation Home Work Mobile   Mosley,Susan Sister [phone]        Other Contacts   None on File      Chief Complaint: slurred speech  Assessment and Plan: CHERYL PEFLEY is a 67 y.o. male presenting with slurred speech and right sided weaknes. Differential for this patient's presentation of this includes TIA vs stroke vs seizure. Given age and risk factors most likely TIA or stroke. No obvious large vessel stroke on CT but small vessel remains possible pending MRI results. Suspect TIA given right sided weakness not present on my exam.  Differential for altered mental status outside of acute neurologic event remains negative thus far and seems unlikely. Reassuringly normal CBG, BMP, PT/INR, no obvious signs of infections, clinical history not consistent with these causes of encephalopathy. Unlikely worsening dementia or delirium given acute presentation.  History notable for hyperlipidemia, hypertension, type 2 diabetes.  Assessment & Plan Right sided weakness Presented with on admission, now resolved on my exam.  Mental status alert and oriented x 2, unclear if this is patient's baseline given history of dementia.  Clearly demonstrates confusion regarding medical events of today.  Plan below for stroke optimization, will collect collateral about patient's baseline mental status with patient's sister. Admit to med-telemetry with cardiac monitoring, attending Dr. Linwood Dibbles Nursing: Vitals per unit routine, out of bed with assistance only, neuro checks q4h C/s neuro stroke team, appreciate recs A1c, Lipid panel pending Imaging:  MRI/MRA, Carotid Dopplers, Echo ordered S/p aspirin 325 mg, Plavix initiation pending MRI results and EEG c/s SLP, PT, OT NPO until passes speech eval Permissive HTN up to 220/120 x 24 hours, hold home amlodipine AM BMP, TSH F/u EEG Cardiac monitoring Hypokalemia Potassium 3.3 in ED.  Replete as indicated. IV potassium ordered   Chronic and Stable Conditions: Anxiety/Depression - hold home Effexor until cleared by speech HLD -hold statin till cleared by speech, will likely need high intensity max dose statin T2DM - last A1c 6.0.  CBGs twice daily   FEN/GI: NPO VTE Prophylaxis: Lovenox  Disposition: Med-Tele  History of Present Illness:  TAIJON KLOSTER is a 67 y.o. male presenting with altered mental status, slurred speech.  When asked how patient came to the ED today.  Patient states he does not know how or why he came here.  When prompted that he was at his urology appointment and he agrees with this.  Agrees that something happened there and then was brought in by the ambulance, but does not remember the event.  Denies any feelings of weakness numbness or tingling currently.  States that he feels like he is currently at his baseline.  Denies any chest pain, shortness of breath or headache.  Family member is not present at bedside.  In the ED, came in as Code Stroke, witnessed syncopal episode at urology office. EMS noted slurred speech and right sided ataxia. Briefly hypoxic in EMS. VSS, labs were notable for normal CBG, normal BMP. CT in ED revealed no acute large vessel stroke. Last known normal yesterday. Administered Aspirin 325mg .   Review Of Systems: Per HPI.  Pertinent Past Medical History: Anxiety Hx of Prostate Cancer HLD  HTN OSA T2DM GERD Gout Depression  Remainder reviewed in history tab.   Pertinent Past Surgical History: Robot assisted radical laparoscopic prostatectomy. Cholecystectomy Hernia Repair   Remainder reviewed in history tab.   Pertinent Social History: Tobacco use: None Alcohol use: None Other Substance use: None Lives with alone, frequent support by family members  Pertinent Family History: Mother - Stroke  Remainder reviewed in history tab.   Important Outpatient Medications: Vascepa Crestor 40mg  daily Seroquel XR 50mg  daily Norvasc 10mg  daily Venlaxafine XR 37.5mg  daily  Remainder reviewed in medication history.   Objective: BP 132/85   Pulse 94   Temp (!) 97.3 F (36.3 C) (Oral)   Resp 18   Ht 5\' 9"  (1.753 m)   Wt 79.8 kg   SpO2 99%   BMI 25.99 kg/m  Exam: General: A&O, NAD, lying comfortably in hospital bed HEENT: No sign of trauma, EOM grossly intact, moist mucous membranes Cardiac: RRR, no m/r/g Respiratory: CTAB, normal WOB, no w/c/r GI: Soft, NTTP, non-distended, no rebound or guarding Extremities: NTTP, no peripheral edema. Neuro: Alert and oriented to person place and situation, not oriented to time.  Cranial nerves II through XII intact, no obvious facial asymmetry, mild word slurring and word finding with speech, negative Romberg, strength 5/5 in bilateral upper and lower extremities, moves all four extremities appropriately, sensation intact in bilateral upper and lower extremities Psych: Appropriate mood and affect   Labs:  CBC BMET  Recent Labs  Lab 04/04/23 1516 04/04/23 1520  WBC 9.2  --   HGB 12.4* 12.6*  HCT 36.4* 37.0*  PLT 158  --    Recent Labs  Lab 04/04/23 1516 04/04/23 1520  NA 138 139  K 3.3* 3.4*  CL 104 103  CO2 24  --   BUN 13 15  CREATININE 1.14 1.10  GLUCOSE 210* 212*  CALCIUM 8.6*  --      CBG - 179 INR - 1.0 Ethanol - 10    EKG: Sinus rhythm, LVH   Imaging Studies Performed:   CT Head w/o Contrast IMPRESSION: 1. No hemorrhage or CT evidence of an acute cortical infarct. 2. 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  CT Angio Head/Heck 1. No large vessel  occlusion. 2. Intracranial atherosclerosis including moderate right and severe left V4 stenoses and a severe right P2 stenosis. 3. Mild cervical carotid atherosclerosis without stenosis. 4. No evidence of an acute infarct or penumbra on CTP. 5.  Aortic Atherosclerosis (ICD10-I70.0).   Celine Mans, MD 04/04/2023, 5:46 PM PGY-2, [facility]  FPTS Intern pager: [phone], text pages welcome Secure chat group [facility] Teaching Service

## 2023-04-04 NOTE — Consult Note (Signed)
NEUROLOGY CONSULT NOTE   Date of service: April 04, 2023 Patient Name: Jacob [name] MRN:  [id number] DOB:  1955-08-28 Chief Complaint: Patient unable to provide chief complaint due to being nonverbal on arrival Requesting Provider: Benjiman Core, MD  History of Present Illness  Jacob [name] is a 67 y.o. 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), Erectile dysfunction (08/23/2013), GERD (gastroesophageal reflux disease), Gout, unspecified (06/29/2006), Headache, High cholesterol, History of gout, History of kidney stones, HYPERLIPIDEMIA (02/19/2007), Hypertension (08/10/2011), Increased urinary frequency, Kidney stones (08/10/2011), Methylprednisolone-induced hypoxia (08/23/2011), Neuromuscular disorder (HCC) (08/10/2011), Obesity, Obesity, Class II, BMI 35-39.9, with comorbidity (06/29/2006), OSA (obstructive sleep apnea), Preoperative clearance, Prostate cancer (HCC), RUQ pain (06/09/2016), Shortness of breath (08/10/2011), Syncope and collapse (06/09/2016), Syncope and collapse (06/09/2016), Type II diabetes mellitus (HCC), and Umbilical hernia.    Patient presented to the ED via EMS from his outpatient urology office after patient had a witnessed syncopal event by office staff while in the waiting room.  On EMS arrival, the patient was seen to have a forced rightward gaze with nystagmus, his pupils were pinpoint but he did not respond to a dose of Narcan, he appeared to have right-sided weakness, was aphasic, and has sonorous respirations.  EMS placed a nasal airway and administered oxygen via a NRB en route with some reported improvement in mental status on arrival to the ED.   At baseline, the patient lives alone but family frequently checks on him.  He does have significant depression and some possible dementia but he is still driving but may get  lost at times.  The patient's sister is not a fully reliable historian but notes that the patient sometimes forgets to shower and may benefit from further care but she is unable to report whether this is related to depression or dementia.  Additionally, the patient was recommended to use a walker one year ago but does not use this at home and still ambulates independently. Patient's sister does note that she told [name] yesterday that she was concerned that he was having a stroke due to onset of slurred speech though he did not seek care at that time.  His speech has since improved some but not fully to baseline.   LKW: Yesterday at an unknown time Modified rankin score: 3-Moderate disability-requires help but walks WITHOUT assistance IV Thrombolysis:  No, patient presented outside of the thrombolytic therapy window EVT:  No, no LVO on vessel imaging  NIHSS components Score: Comment  1a Level of Conscious 0[]  1[x]  2[]  3[]     0  1b LOC Questions 0[]  1[]  2[x]      2  1c LOC Commands 0[]  1[x]  2[]      0  2 Best Gaze 0[x]  1[]  2[]      0  3 Visual 0[x]  1[]  2[]  3[]     0  4 Facial Palsy 0[]  1[x]  2[]  3[]     1  5a Motor Arm - left 0[x]  1[]  2[]  3[]  4[]  UN[]   0  5b Motor Arm - Right 0[]  1[x]  2[]  3[]  4[]  UN[]   1  6a Motor Leg - Left 0[]  1[]  2[]  3[x]  4[]  UN[]   1  6b Motor Leg - Right 0[]  1[]  2[x]  3[]  4[]  UN[]   3  7 Limb Ataxia 0[x]  1[]  2[]  3[]  UN[]    0  8 Sensory 0[x]  1[]  2[]  UN[]     1  9 Best Language 0[]  1[x]  2[]   3[]     1  10 Dysarthria 0[]  1[x]  2[]  UN[]     1   11 Extinct. and Inattention 0[x]  1[]  2[]      0  TOTAL:  13 initially improved after CT to -->  11   ROS   Unable to ascertain initially due to aphasia, decreased LOC. On follow-up comprehensive ROS performed and pertinent positives documented in HPI.  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 meds   Increased urinary frequency    Kidney stones 08/10/2011   once in past, none recent   Methylprednisolone-induced hypoxia 08/23/2011   Neuromuscular disorder (HCC) 08/10/2011   "burning/tingling sensation In feet"   Obesity    Obesity, Class II, BMI 35-39.9, with comorbidity 06/29/2006   OSA (obstructive sleep apnea)    "couldn't deal w/CPAP" (06/08/2016)   Preoperative clearance    Prostate cancer (HCC)    RUQ pain 06/09/2016   Shortness of breath 08/10/2011   with increased activity only   Syncope and collapse 06/09/2016   Syncope and collapse 06/09/2016   Type II diabetes mellitus (HCC)    Umbilical hernia    Past Surgical History:  Procedure Laterality Date   CATARACT EXTRACTION W/ INTRAOCULAR LENS  IMPLANT, BILATERAL Bilateral 08/10/2011 - ~ 2014   "right - left"   CHOLECYSTECTOMY N/A 06/10/2016   Procedure: LAPAROSCOPIC CHOLECYSTECTOMY WITH INTRAOPERATIVE CHOLANGIOGRAM;  Surgeon: Harriette Bouillon, MD;  Location: [facility];  Service: General;  Laterality: N/A;   COLONOSCOPY W/ BIOPSIES AND POLYPECTOMY  2013   HERNIA  REPAIR     LIPOMA EXCISION Right 10/18/2018   Procedure: EXCISION RIGHT BACK  LIPOMA X 2;  Surgeon: Harriette Bouillon, MD;  Location: [facility];  Service: General;  Laterality: Right;   LYMPHADENECTOMY Bilateral 02/14/2022   Procedure: LYMPHADENECTOMY, PELVIC;  Surgeon: Heloise Purpura, MD;  Location: [facility];  Service: Urology;  Laterality: Bilateral;   PROSTATE BIOPSY     ROBOT ASSISTED LAPAROSCOPIC RADICAL PROSTATECTOMY N/A 02/14/2022   Procedure: XI ROBOTIC ASSISTED LAPAROSCOPIC RADICAL PROSTATECTOMY LEVEL 2;  Surgeon: Heloise Purpura, MD;  Location: [facility];  Service: Urology;  Laterality: N/A;   VENTRAL HERNIA REPAIR  08/22/2011   Procedure: HERNIA REPAIR VENTRAL ADULT;  Surgeon: Ernestene Mention, MD;  Location: [facility];  Service: General;  Laterality: N/A;  incarcerated   Family History: Family History  Problem Relation Age of Onset   Hyperlipidemia Mother  Diabetes Mother    Stroke Mother    Hyperlipidemia Father    Colon cancer Neg Hx    Social History  reports that he has never smoked. He has never used smokeless tobacco. He reports that he does not drink alcohol and does not use drugs.  Allergies  Allergen Reactions   Methylprednisolone Sodium Succ     Respiratory failure Pt states he has never had respiratory failure, does not recall this reaction   Ms Contin [Morphine] Nausea Only   Medications   Current Facility-Administered Medications:    sodium chloride flush (NS) 0.9 % injection 3 mL, 3 mL, Intravenous, Once, Benjiman Core, MD  Current Outpatient Medications:    amLODipine (NORVASC) 10 MG tablet, Take 1 tablet (10 mg total) by mouth daily., Disp: 30 tablet, Rfl: 3   icosapent Ethyl (VASCEPA) 1 g capsule, Take 2 capsules (2 g total) by mouth 2 (two) times daily. (Patient not taking: Reported on 10/02/2022), Disp: 120 capsule, Rfl: 2   QUEtiapine (SEROQUEL XR) 50 MG TB24 24 hr tablet, SMARTSIG:1 Tablet(s) By Mouth Every Evening, Disp: , Rfl:     rosuvastatin (CRESTOR) 40 MG tablet, Take 1 tablet (40 mg total) by mouth daily., Disp: 90 tablet, Rfl: 3   venlafaxine XR (EFFEXOR-XR) 37.5 MG 24 hr capsule, TAKE 1 CAPSULE BY MOUTH DAILY WITH BREAKFAST., Disp: 90 capsule, Rfl: 1  Vitals   Vitals:   04/04/23 1545  BP: 127/74  Pulse: 92  Resp: 19  SpO2: 99%    There is no height or weight on file to calculate BMI.  Physical Exam   Constitutional: Obese caucasian male laying in EMS stretcher with NRB mask in place. Psych: Unable to assess due to lethargy initially, LOC improved and patient was calm and cooperative with exam Eyes: No scleral injection.  HENT: No OP obstruction Head: Normocephalic. Cardiovascular: Normal rate and regular rhythm.  Respiratory: Effort normal, non-labored breathing initially on NRB but able to wean off as his level of consciousness improves GI: Soft.  Midline hernia bulge.  Skin: WDI.   Neurologic Examination   Mental Status: Patient is lethargic on arrival with gradual improvement in mental status during acute assessment.  Patient is unable to give a clear and coherent history and he does not have any recollection of events leading to hospitalization.  He answers in one word sentences but is unable to state his correct age, the current month, or year. Following CT, the patient is more alert and able to state his name, his date of birth, and that he is in [place] but remains unable to state his age, history of presentation, or the current month.  Patient does have some trouble with naming and word-finding. [name] able to name thumb, and ink pen but he states "electronic thing" when shown a watch. Speech is nonfluent and slowed as well as slurred from baseline per patient.  Cranial Nerves: II: Visual Fields are full. PERRL III,IV, VI: EOMI without gaze preference or ptosis   V: Decreased sensation to light touch on the right face reported VII: Slightly less elevation of the right mouth with smiling VIII:  Hearing is intact to voice X: Palate elevates symmetrically XI: Shoulder shrug is symmetric. XII: Tongue protrudes midline without atrophy or fasciculations.  Motor: Tone is normal. Bulk is normal.  There is some evidence of right-sided weakness with some improvement on reassessment but persistent. Initially, patient drops his right arm to the bed immediately with passive elevation with minimal movement without  gravity, patient's left arm is able to slightly resist gravity but drifts to bed.  On initial assessment, patient's right leg has some weakness with drift but his left leg minimally moves without gravity (thought due to participation related to level of altertness). On reassessment with improvement in level of altertness, patient is able to elevate left upper extremity antigravity without vertical drift and right upper extremity with minimal pronator drift. Patient's right lower extremity assessment is pain-limited but within that limitation, he is able to attempt antigravity movement, left lower extremity slightly elevates antigravity with vertical drift.  Sensory: Decreased sensation to light touch reported to the right upper and lower extremities Cerebellar: FNF intact bilaterally, unable to assess HKS due to pain and weakness.   Labs/Imaging/Neurodiagnostic studies   CBC:  Recent Labs  Lab 13-Apr-2023 1516 Apr 13, 2023 1520  WBC 9.2  --   NEUTROABS 6.8  --   HGB 12.4* 12.6*  HCT 36.4* 37.0*  MCV 82.2  --   PLT 158  --    Basic Metabolic Panel:  Lab Results  Component Value Date   NA 139 April 13, 2023   K 3.4 (L) 04-13-2023   CO2 24 02/27/2023   GLUCOSE 212 (H) 2023-04-13   BUN 15 04/13/23   CREATININE 1.10 2023-04-13   CALCIUM 9.1 02/27/2023   GFRNONAA >60 10/02/2022   GFRAA 91 11/15/2019   Lipid Panel:  Lab Results  Component Value Date   LDLCALC 36 03/22/2021   HgbA1c:  Lab Results  Component Value Date   HGBA1C 6.0 02/27/2023   Urine Drug Screen:      Component Value Date/Time   LABOPIA NONE DETECTED 04/20/2019 1444   COCAINSCRNUR NONE DETECTED 04/20/2019 1444   LABBENZ NONE DETECTED 04/20/2019 1444   AMPHETMU NONE DETECTED 04/20/2019 1444   THCU NONE DETECTED 04/20/2019 1444   LABBARB NONE DETECTED 04/20/2019 1444    Alcohol Level     Component Value Date/Time   ETH <10 10/03/2018 2222   INR No results found for: "INR" APTT No results found for: "APTT" AED levels: No results found for: "PHENYTOIN", "ZONISAMIDE", "LAMOTRIGINE", "LEVETIRACETA"  CT Head without contrast(Personally reviewed): 1. No hemorrhage or CT evidence of an acute cortical infarct. 2. 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  CT angio Head and Neck with contrast CT perfusion (Personally reviewed): 1. No large vessel occlusion. 2. Intracranial atherosclerosis including moderate right and severe left V4 stenoses and a severe right P2 stenosis. 3. Mild cervical carotid atherosclerosis without stenosis. 4. No evidence of an acute infarct or penumbra on CTP. 5.  Aortic Atherosclerosis (ICD10-I70.0). [Normal variant, basilar artery fenestration]  ASSESSMENT   DALEN LARGENT is a 67 y.o. man with multiple vascular risk factors including hypertension, hyperlipidemia, type 2 diabetes, obstructive sleep apnea, BMI 26, who presented April 13, 2023 via EMS after he had a witnessed syncopal episode in his outpatient urology waiting room office with a subsequent forced right gaze with nystagmus, aphasia, right-sided weakness, and sonorous respirations by EMS.  Patient's mental status improved slightly during transport for EMS and continued to improve on ED arrival.  Further history from patient's sister supports the onset of slurred speech yesterday with some improvement but not back to baseline.  Patient was not given TNK as he presented outside of the thrombolytic therapy time window and was not a candidate for IR  as there was no LVO identified on imaging.  Presentation is concerning for an acute ischemic stroke though cannot  rule out seizure with patient's syncopal event, forced gaze, and gradual improvement in symptoms during assessment.  Will complete stroke work up as well as obtain EEG.   RECOMMENDATIONS  - HgbA1c, fasting lipid panel - MRI of the brain without contrast - Frequent neuro checks - Echocardiogram - Carotid dopplers - Prophylactic therapy- Antiplatelet med: Aspirin - dose 325mg  PO or 300mg  PR for now, further prophylaxis recommendations pending results of stroke work up - Risk factor modification - Telemetry monitoring - PT consult, OT consult, Speech consult - Stroke team to follow - Routine EEG  ______________________________________________________________________  Lanae Boast, AGACNP-BC [facility] Pager: [phone]  Attending Neurologist's note:  I personally saw this patient, gathering history, performing a full neurologic examination, reviewing relevant labs, personally reviewing relevant imaging including head CT and CTA head and neck, and formulated the assessment and plan, adding the note above for completeness and clarity to accurately reflect my thoughts   Brooke Dare MD-PhD [facility] [phone]  CRITICAL CARE Performed by: Gordy Councilman   Total critical care time: 40 minutes  Critical care time was exclusive of separately billable procedures and treating other patients.  Critical care was necessary to treat or prevent imminent or life-threatening deterioration.  Critical care was time spent personally by me on the following activities: development of treatment plan with patient and/or surrogate as well as nursing, discussions with consultants, evaluation of patient's response to treatment, examination of patient, obtaining history from patient or surrogate, ordering and performing treatments and interventions,  ordering and review of laboratory studies, ordering and review of radiographic studies, pulse oximetry and re-evaluation of patient's condition.

## 2023-04-04 NOTE — Code Documentation (Signed)
Jacob [name] is a 67 yr old male arriving to [facility] via EMS on 04/04/2023. He has a PMH of prostate cancer, GERD, anxiety, OSA, obesity. Pt is coming from his urologist's office, where he was last known well when he checked in at 1340. Sometime after this, he passed out at the dr's office. He was slow to wake up, and seemed to move the left side > right, so code stroke alert was activated. Pt is not on any blood thinners.    Pt met at bridge by stroke team. Labs, CBG obtained, airway cleared by EDP. Pt to CT with team. NIHSS at that time 15. Pt with drowsiness, disoriented, has rt sided weakness and delayed speech. Please see documentation for NIHSS and timeline. The following imaging was obtained: CT, CTA. Per Dr. Iver Nestle, CT was negative for hemorrhage, and CTA was negative for LVO.     Pt taken back to ED room 28 where his workup will continue. [name] more alert and talkative now, NIHSS 11. Pt is not eligible for TNK as further discussions with family reveal that he was also altered somewhat yesterday. Pt is ineligible for NIR as he is LVO negative.

## 2023-04-05 ENCOUNTER — Observation Stay (HOSPITAL_COMMUNITY): Payer: 59

## 2023-04-05 ENCOUNTER — Encounter (HOSPITAL_COMMUNITY): Payer: 59

## 2023-04-05 DIAGNOSIS — K219 Gastro-esophageal reflux disease without esophagitis: Secondary | ICD-10-CM | POA: Diagnosis present

## 2023-04-05 DIAGNOSIS — F0394 Unspecified dementia, unspecified severity, with anxiety: Secondary | ICD-10-CM | POA: Diagnosis present

## 2023-04-05 DIAGNOSIS — E782 Mixed hyperlipidemia: Secondary | ICD-10-CM | POA: Diagnosis present

## 2023-04-05 DIAGNOSIS — G4733 Obstructive sleep apnea (adult) (pediatric): Secondary | ICD-10-CM | POA: Diagnosis present

## 2023-04-05 DIAGNOSIS — E876 Hypokalemia: Secondary | ICD-10-CM | POA: Diagnosis present

## 2023-04-05 DIAGNOSIS — E1165 Type 2 diabetes mellitus with hyperglycemia: Secondary | ICD-10-CM | POA: Diagnosis present

## 2023-04-05 DIAGNOSIS — I639 Cerebral infarction, unspecified: Secondary | ICD-10-CM

## 2023-04-05 DIAGNOSIS — M109 Gout, unspecified: Secondary | ICD-10-CM | POA: Diagnosis present

## 2023-04-05 DIAGNOSIS — G8191 Hemiplegia, unspecified affecting right dominant side: Secondary | ICD-10-CM | POA: Diagnosis present

## 2023-04-05 DIAGNOSIS — I6389 Other cerebral infarction: Secondary | ICD-10-CM | POA: Diagnosis present

## 2023-04-05 DIAGNOSIS — Z23 Encounter for immunization: Secondary | ICD-10-CM | POA: Diagnosis not present

## 2023-04-05 DIAGNOSIS — Z743 Need for continuous supervision: Secondary | ICD-10-CM | POA: Diagnosis not present

## 2023-04-05 DIAGNOSIS — R270 Ataxia, unspecified: Secondary | ICD-10-CM | POA: Diagnosis present

## 2023-04-05 DIAGNOSIS — I10 Essential (primary) hypertension: Secondary | ICD-10-CM | POA: Diagnosis present

## 2023-04-05 DIAGNOSIS — F419 Anxiety disorder, unspecified: Secondary | ICD-10-CM | POA: Diagnosis present

## 2023-04-05 DIAGNOSIS — E1140 Type 2 diabetes mellitus with diabetic neuropathy, unspecified: Secondary | ICD-10-CM | POA: Diagnosis present

## 2023-04-05 DIAGNOSIS — G8929 Other chronic pain: Secondary | ICD-10-CM | POA: Diagnosis present

## 2023-04-05 DIAGNOSIS — K439 Ventral hernia without obstruction or gangrene: Secondary | ICD-10-CM | POA: Diagnosis present

## 2023-04-05 DIAGNOSIS — Z888 Allergy status to other drugs, medicaments and biological substances status: Secondary | ICD-10-CM | POA: Diagnosis not present

## 2023-04-05 DIAGNOSIS — F32A Depression, unspecified: Secondary | ICD-10-CM | POA: Diagnosis present

## 2023-04-05 DIAGNOSIS — E7800 Pure hypercholesterolemia, unspecified: Secondary | ICD-10-CM | POA: Diagnosis present

## 2023-04-05 DIAGNOSIS — F0393 Unspecified dementia, unspecified severity, with mood disturbance: Secondary | ICD-10-CM | POA: Diagnosis present

## 2023-04-05 DIAGNOSIS — C61 Malignant neoplasm of prostate: Secondary | ICD-10-CM | POA: Diagnosis present

## 2023-04-05 DIAGNOSIS — R471 Dysarthria and anarthria: Secondary | ICD-10-CM | POA: Diagnosis present

## 2023-04-05 DIAGNOSIS — R569 Unspecified convulsions: Secondary | ICD-10-CM | POA: Diagnosis not present

## 2023-04-05 DIAGNOSIS — R29713 NIHSS score 13: Secondary | ICD-10-CM | POA: Diagnosis present

## 2023-04-05 DIAGNOSIS — R404 Transient alteration of awareness: Secondary | ICD-10-CM | POA: Diagnosis not present

## 2023-04-05 DIAGNOSIS — Z9049 Acquired absence of other specified parts of digestive tract: Secondary | ICD-10-CM | POA: Diagnosis not present

## 2023-04-05 DIAGNOSIS — R300 Dysuria: Secondary | ICD-10-CM | POA: Diagnosis present

## 2023-04-05 LAB — ECHOCARDIOGRAM COMPLETE
AR max vel: 1.1 cm2
AV Area VTI: 1.02 cm2
AV Area mean vel: 0.94 cm2
AV Mean grad: 16 mm[Hg]
AV Peak grad: 26.1 mm[Hg]
Ao pk vel: 2.55 m/s
Area-P 1/2: 3.31 cm2
Height: 69 in
S' Lateral: 3.4 cm
Weight: 2816 [oz_av]

## 2023-04-05 LAB — URINALYSIS, W/ REFLEX TO CULTURE (INFECTION SUSPECTED)
Bacteria, UA: NONE SEEN
Bilirubin Urine: NEGATIVE
Glucose, UA: NEGATIVE mg/dL
Hgb urine dipstick: NEGATIVE
Ketones, ur: NEGATIVE mg/dL
Leukocytes,Ua: NEGATIVE
Nitrite: NEGATIVE
Protein, ur: NEGATIVE mg/dL
Specific Gravity, Urine: >1.046 — ABNORMAL HIGH (ref 1.005–1.030)
pH: 5 (ref 5.0–8.0)

## 2023-04-05 LAB — TSH: TSH: 1.46 u[IU]/mL (ref 0.350–4.500)

## 2023-04-05 LAB — BASIC METABOLIC PANEL
Anion gap: 8 (ref 5–15)
BUN: 12 mg/dL (ref 8–23)
CO2: 25 mmol/L (ref 22–32)
Calcium: 8.3 mg/dL — ABNORMAL LOW (ref 8.9–10.3)
Chloride: 106 mmol/L (ref 98–111)
Creatinine, Ser: 0.87 mg/dL (ref 0.61–1.24)
GFR, Estimated: >60 mL/min (ref >60)
Glucose, Bld: 123 mg/dL — ABNORMAL HIGH (ref 70–99)
Potassium: 3.8 mmol/L (ref 3.5–5.1)
Sodium: 139 mmol/L (ref 135–145)

## 2023-04-05 LAB — CBG MONITORING, ED
Glucose-Capillary: 144 mg/dL — ABNORMAL HIGH (ref 70–99)
Glucose-Capillary: 164 mg/dL — ABNORMAL HIGH (ref 70–99)

## 2023-04-05 LAB — RAPID URINE DRUG SCREEN, HOSP PERFORMED
Amphetamines: NOT DETECTED
Barbiturates: NOT DETECTED
Benzodiazepines: NOT DETECTED
Cocaine: NOT DETECTED
Opiates: NOT DETECTED
Tetrahydrocannabinol: NOT DETECTED

## 2023-04-05 LAB — HIV ANTIBODY (ROUTINE TESTING W REFLEX): HIV Screen 4th Generation wRfx: NONREACTIVE

## 2023-04-05 LAB — GLUCOSE, CAPILLARY: Glucose-Capillary: 196 mg/dL — ABNORMAL HIGH (ref 70–99)

## 2023-04-05 MED ORDER — POTASSIUM CHLORIDE 10 MEQ/100ML IV SOLN
10.0000 meq | Freq: Once | INTRAVENOUS | Status: AC
Start: 2023-04-05 — End: 2023-04-05
  Administered 2023-04-05: 10 meq via INTRAVENOUS
  Filled 2023-04-05: qty 100

## 2023-04-05 MED ORDER — VENLAFAXINE HCL ER 37.5 MG PO CP24
37.5000 mg | ORAL_CAPSULE | Freq: Every day | ORAL | Status: DC
  Administered 2023-04-05 – 2023-04-11 (×7): 37.5 mg via ORAL
  Filled 2023-04-05 (×7): qty 1

## 2023-04-05 MED ORDER — CLOPIDOGREL BISULFATE 75 MG PO TABS
75.0000 mg | ORAL_TABLET | Freq: Every day | ORAL | Status: DC
  Administered 2023-04-05 – 2023-04-11 (×7): 75 mg via ORAL
  Filled 2023-04-05 (×7): qty 1

## 2023-04-05 MED ORDER — ATORVASTATIN CALCIUM 80 MG PO TABS
80.0000 mg | ORAL_TABLET | Freq: Every day | ORAL | Status: DC
  Administered 2023-04-05 – 2023-04-11 (×7): 80 mg via ORAL
  Filled 2023-04-05 (×7): qty 1

## 2023-04-05 MED ORDER — STROKE: EARLY STAGES OF RECOVERY BOOK
Freq: Once | Status: AC
  Filled 2023-04-05: qty 1

## 2023-04-05 MED ORDER — INSULIN ASPART 100 UNIT/ML IJ SOLN
0.0000 [IU] | Freq: Three times a day (TID) | INTRAMUSCULAR | Status: DC
  Administered 2023-04-05: 1 [IU] via SUBCUTANEOUS
  Administered 2023-04-05: 2 [IU] via SUBCUTANEOUS
  Administered 2023-04-05: 1 [IU] via SUBCUTANEOUS
  Administered 2023-04-06: 2 [IU] via SUBCUTANEOUS
  Administered 2023-04-06: 1 [IU] via SUBCUTANEOUS
  Administered 2023-04-06: 2 [IU] via SUBCUTANEOUS
  Administered 2023-04-07 (×2): 1 [IU] via SUBCUTANEOUS
  Administered 2023-04-07: 2 [IU] via SUBCUTANEOUS
  Administered 2023-04-08: 1 [IU] via SUBCUTANEOUS
  Administered 2023-04-08: 2 [IU] via SUBCUTANEOUS
  Administered 2023-04-09: 3 [IU] via SUBCUTANEOUS
  Administered 2023-04-09: 1 [IU] via SUBCUTANEOUS
  Administered 2023-04-09: 3 [IU] via SUBCUTANEOUS
  Administered 2023-04-10 – 2023-04-11 (×3): 2 [IU] via SUBCUTANEOUS

## 2023-04-05 MED ORDER — ASPIRIN 81 MG PO CHEW
81.0000 mg | CHEWABLE_TABLET | Freq: Every day | ORAL | Status: DC
  Administered 2023-04-06 – 2023-04-11 (×6): 81 mg via ORAL
  Filled 2023-04-05 (×6): qty 1

## 2023-04-05 NOTE — Progress Notes (Addendum)
STROKE TEAM PROGRESS NOTE   BRIEF HPI Mr. CAELLUM STORK is a 67 y.o. male with history of Zaidi and depression, GERD, headaches, gout, hyperlipidemia, hypertension, diabetes, obstructive sleep apnea, prostate cancer, obesity, neuromuscular disorder presenting syncopal witnessed event at his outpatient urology appointment.  And EMS arrived he was noted to have a rightward gaze with nystagmus as well as right-sided weakness and was aphasic he was given Narcan with no response.  NIH on Admission 13   SIGNIFICANT HOSPITAL EVENTS 12/3 admitted.  CT with no acute process.  CTA with no LVO.  MRI brain with acute infarcts in left basal ganglia and corona radiata, left posterior temporal cortex and posterior left pons  INTERIM HISTORY/SUBJECTIVE  No family at the bedside.  Patient is laying in the bed in no apparent distress.  [name] confused he is unable to tell us the story of what happened yesterday MRI did reveal acute infarcts in left basal ganglia and corona radiata, and left posterior temporal cortex and posterior left pons  Routine EEG was within normal limits.  OBJECTIVE  CBC    Component Value Date/Time   WBC 9.2 04/04/2023 1516   RBC 4.43 04/04/2023 1516   HGB 12.6 (L) 04/04/2023 1520   HGB 13.4 02/27/2023 1717   HCT 37.0 (L) 04/04/2023 1520   HCT 43.0 02/27/2023 1717   PLT 158 04/04/2023 1516   PLT 186 02/27/2023 1717   MCV 82.2 04/04/2023 1516   MCV 89 02/27/2023 1717   MCH 28.0 04/04/2023 1516   MCHC 34.1 04/04/2023 1516   RDW 13.2 04/04/2023 1516   RDW 13.3 02/27/2023 1717   LYMPHSABS 1.6 04/04/2023 1516   LYMPHSABS 1.2 02/27/2023 1717   MONOABS 0.5 04/04/2023 1516   EOSABS 0.1 04/04/2023 1516   EOSABS 0.2 02/27/2023 1717   BASOSABS 0.0 04/04/2023 1516   BASOSABS 0.0 02/27/2023 1717    BMET    Component Value Date/Time   NA 139 04/05/2023 0241   NA 143 02/27/2023 1717   K 3.8 04/05/2023 0241   CL 106 04/05/2023 0241   CO2 25 04/05/2023 0241   GLUCOSE 123  (H) 04/05/2023 0241   BUN 12 04/05/2023 0241   BUN 12 02/27/2023 1717   CREATININE 0.87 04/05/2023 0241   CREATININE 0.83 04/08/2015 1026   CALCIUM 8.3 (L) 04/05/2023 0241   EGFR 89 02/27/2023 1717   GFRNONAA >60 04/05/2023 0241   GFRNONAA >89 04/08/2015 1026    IMAGING past 24 hours ECHOCARDIOGRAM COMPLETE  Result Date: 04/05/2023    ECHOCARDIOGRAM REPORT   Patient Name:   BRIXTON KIRLEY Date of Exam: 04/05/2023 Medical Rec #:  [id number]       Height:       69.0 in Accession #:    [id number]      Weight:       176.0 lb Date of Birth:  May 12, 1955        BSA:          1.957 m Patient Age:    67 years        BP:           153/89 mmHg Patient Gender: M               HR:           85 bpm. Exam Location:  Inpatient Procedure: 2D Echo, Cardiac Doppler and Color Doppler Indications:    Stroke I63.9  History:        Patient has prior  history of Echocardiogram examinations, most                 recent 05/25/2021. TIA, Signs/Symptoms:Chest Pain and Syncope;                 Risk Factors:Hypertension, Sleep Apnea, Diabetes and                 Dyslipidemia.  Sonographer:    Lucendia Herrlich RCS Referring Phys: [id number] Darl Householder RUMBALL IMPRESSIONS  1. Left ventricular ejection fraction, by estimation, is 50 to 55%. The left ventricle has low normal function. The left ventricle has no regional wall motion abnormalities. Left ventricular diastolic parameters are indeterminate.  2. Right ventricular systolic function is normal. The right ventricular size is normal.  3. The mitral valve is normal in structure. No evidence of mitral valve regurgitation. No evidence of mitral stenosis.  4. The aortic valve is normal in structure. Aortic valve regurgitation is mild. Mild aortic valve stenosis.  5. The inferior vena cava is normal in size with greater than 50% respiratory variability, suggesting right atrial pressure of 3 mmHg. FINDINGS  Left Ventricle: Left ventricular ejection fraction, by estimation, is 50 to 55%. The left  ventricle has low normal function. The left ventricle has no regional wall motion abnormalities. The left ventricular internal cavity size was normal in size. There is no left ventricular hypertrophy. Left ventricular diastolic parameters are indeterminate. Right Ventricle: The right ventricular size is normal. No increase in right ventricular wall thickness. Right ventricular systolic function is normal. Left Atrium: Left atrial size was normal in size. Right Atrium: Right atrial size was normal in size. Pericardium: There is no evidence of pericardial effusion. Mitral Valve: The mitral valve is normal in structure. No evidence of mitral valve regurgitation. No evidence of mitral valve stenosis. Tricuspid Valve: The tricuspid valve is normal in structure. Tricuspid valve regurgitation is not demonstrated. No evidence of tricuspid stenosis. Aortic Valve: The aortic valve is normal in structure. Aortic valve regurgitation is mild. Mild aortic stenosis is present. Aortic valve mean gradient measures 16.0 mmHg. Aortic valve peak gradient measures 26.1 mmHg. Aortic valve area, by VTI measures 1.02 cm. Pulmonic Valve: The pulmonic valve was normal in structure. Pulmonic valve regurgitation is not visualized. No evidence of pulmonic stenosis. Aorta: The aortic root is normal in size and structure. Venous: The inferior vena cava is normal in size with greater than 50% respiratory variability, suggesting right atrial pressure of 3 mmHg. IAS/Shunts: No atrial level shunt detected by color flow Doppler.  LEFT VENTRICLE PLAX 2D LVIDd:         4.80 cm   Diastology LVIDs:         3.40 cm   LV e' medial:    4.29 cm/s LV PW:         0.70 cm   LV E/e' medial:  15.5 LV IVS:        0.90 cm   LV e' lateral:   4.99 cm/s LVOT diam:     1.90 cm   LV E/e' lateral: 13.3 LV SV:         45 LV SV Index:   23 LVOT Area:     2.84 cm  RIGHT VENTRICLE             IVC RV S prime:     20.70 cm/s  IVC diam: 1.80 cm TAPSE (M-mode): 2.7 cm LEFT  ATRIUM  Index        RIGHT ATRIUM           Index LA diam:        3.10 cm 1.58 cm/m   RA Area:     12.70 cm LA Vol (A2C):   41.6 ml 21.26 ml/m  RA Volume:   29.90 ml  15.28 ml/m LA Vol (A4C):   44.5 ml 22.74 ml/m LA Biplane Vol: 46.2 ml 23.61 ml/m  AORTIC VALVE AV Area (Vmax):    1.10 cm AV Area (Vmean):   0.94 cm AV Area (VTI):     1.02 cm AV Vmax:           255.20 cm/s AV Vmean:          184.000 cm/s AV VTI:            0.437 m AV Peak Grad:      26.1 mmHg AV Mean Grad:      16.0 mmHg LVOT Vmax:         99.30 cm/s LVOT Vmean:        60.800 cm/s LVOT VTI:          0.157 m LVOT/AV VTI ratio: 0.36  AORTA Ao Root diam: 3.40 cm Ao Asc diam:  3.50 cm MITRAL VALVE               TRICUSPID VALVE MV Area (PHT): 3.31 cm    TR Peak grad:   9.0 mmHg MV Decel Time: 229 msec    TR Vmax:        150.00 cm/s MV E velocity: 66.60 cm/s MV A velocity: 98.90 cm/s  SHUNTS MV E/A ratio:  0.67        Systemic VTI:  0.16 m                            Systemic Diam: 1.90 cm Kardie Tobb DO Electronically signed by Thomasene Ripple DO Signature Date/Time: 04/05/2023/12:26:48 PM    Final    EEG adult  Result Date: 04/05/2023 Charlsie Quest, MD     04/05/2023 10:30 AM Patient Name: PAMELA JANUSZ MRN: [id number] Epilepsy Attending: Charlsie Quest Referring Physician/Provider: Kara Mead, NP Date: 04/05/2023 Duration: 25.37 mins Patient history: 67yo M with syncope getting eeg to evaluate for seizure Level of alertness: Awake AEDs during EEG study: None Technical aspects: This EEG study was done with scalp electrodes positioned according to the 10-20 International system of electrode placement. Electrical activity was reviewed with band pass filter of 1-70Hz , sensitivity of 7 uV/mm, display speed of 37mm/sec with a 60Hz  notched filter applied as appropriate. EEG data were recorded continuously and digitally stored.  Video monitoring was available and reviewed as appropriate. Description: The posterior dominant rhythm  consists of 8 Hz activity of moderate voltage (25-35 uV) seen predominantly in posterior head regions, symmetric and reactive to eye opening and eye closing. Physiologic photic driving was not seen during photic stimulation.  Hyperventilation was not performed.   IMPRESSION: This study is within normal limits. No seizures or epileptiform discharges were seen throughout the recording. A normal interictal EEG does not exclude the diagnosis of epilepsy. Charlsie Quest   MR BRAIN WO CONTRAST  Result Date: 04/04/2023 CLINICAL DATA:  Syncopal episode, slurred speech, right-sided deviation and deficits EXAM: MRI HEAD WITHOUT CONTRAST TECHNIQUE: Multiplanar, multiecho pulse sequences of the brain and surrounding structures were obtained without intravenous contrast. COMPARISON:  03/15/2022  MRI head, 04/04/2023 CT head FINDINGS: Brain: Restricted diffusion with ADC correlate in the left basal ganglia/corona radiata (series 5, images 85-86 and series 7, image 59), left posterior temporal cortex (series 5, image 78), and posterior left pons (series 5, image 70), which are concerning for acute infarcts. No acute hemorrhage, mass, mass effect, or midline shift. No hydrocephalus or extra-axial collection. Craniocervical junction within normal limits. Multiple punctate foci of hemosiderin deposition in the cerebral hemispheres, right basal ganglia, and right thalamus, likely sequela prior hypertensive microhemorrhages. No evidence of superficial siderosis. Advanced cerebral volume loss for age. Confluent T2 hyperintense signal in the periventricular white matter, likely the sequela of moderate to severe chronic small vessel ischemic disease. Remote lacunar infarcts in the thalami and basal ganglia. No significant remote cortical infarct. Vascular: Normal arterial flow voids. Skull and upper cervical spine: Normal marrow signal. Sinuses/Orbits: Clear paranasal sinuses. No acute finding in the orbits. Status post bilateral  lens replacements. Other: Trace fluid in the mastoid air cells. IMPRESSION: Acute infarcts in the left basal ganglia/corona radiata, left posterior temporal cortex, and posterior left pons. No acute hemorrhage. Electronically Signed   By: Wiliam Ke M.D.   On: 04/04/2023 18:42   CT ANGIO HEAD NECK W WO CM W PERF (CODE STROKE)  Result Date: 04/04/2023 CLINICAL DATA:  Neuro deficit, acute, stroke suspected. Right-sided weakness. EXAM: CT ANGIOGRAPHY HEAD AND NECK CT PERFUSION BRAIN TECHNIQUE: Multidetector CT imaging of the head and neck was performed using the standard protocol during bolus administration of intravenous contrast. Multiplanar CT image reconstructions and MIPs were obtained to evaluate the vascular anatomy. Carotid stenosis measurements (when applicable) are obtained utilizing NASCET criteria, using the distal internal carotid diameter as the denominator. Multiphase CT imaging of the brain was performed following IV bolus contrast injection. Subsequent parametric perfusion maps were calculated using RAPID software. RADIATION DOSE REDUCTION: This exam was performed according to the departmental dose-optimization program which includes automated exposure control, adjustment of the mA and/or kV according to patient size and/or use of iterative reconstruction technique. CONTRAST:  OMNIPAQUE IOHEXOL 350 MG/ML SOLN COMPARISON:  None Available. FINDINGS: CTA NECK FINDINGS Aortic arch: Standard 3 vessel aortic arch with mild atherosclerosis. Widely patent arch vessel origins. Right carotid system: Patent with a small amount calcified and soft plaque at the carotid bifurcation. No evidence of a significant stenosis or dissection. Left carotid system: Patent with a small amount of calcified and soft plaque at the carotid bifurcation. No evidence of a significant stenosis or dissection. Vertebral arteries: Patent and codominant. Limited assessment of the left vertebral origin due to artifact with an  underlying stenosis not excluded. Skeleton: Advanced disc degeneration in the lower cervical spine. Advanced facet arthrosis on the left at C3-4 and on the right at C4-5. Other neck: No evidence of cervical lymphadenopathy or mass. Prominent fatty atrophy of the right submandibular gland. Upper chest: Clear lung apices. Review of the MIP images confirms the above findings CTA HEAD FINDINGS Anterior circulation: The internal carotid arteries are patent from skull base to carotid termini with mild atherosclerosis bilaterally not resulting in significant stenosis. ACAs and MCAs are patent with distal branch vessel atherosclerotic irregularity but no evidence of a proximal branch occlusion or significant proximal stenosis. No aneurysm is identified. Posterior circulation: The intracranial vertebral arteries are patent to the basilar with atherosclerosis resulting in moderate right and severe left V4 stenoses. The basilar artery is widely patent with an incidental fenestration noted proximally. Patent right PICA, left AICA, and  bilateral SCA origins are visualized. There are moderate right and large left posterior communicating arteries with absence of the left P1 segment. The PCAs are patent with severe right and mild left proximal P2 stenoses. No aneurysm is identified. Venous sinuses: As permitted by contrast timing, patent. Anatomic variants: Fetal left PCA. Review of the MIP images confirms the above findings CT Brain Perfusion Findings: ASPECTS: 10 CBF (<30%) Volume: 0 mL Perfusion (Tmax>6.0s) volume: 0 mL Mismatch Volume: 0 mL Infarction Location: No acute infarct identified by CTP Preliminary emergent findings were discussed by telephone with Dr. Iver Nestle on 04/04/2023 at 3:36 p.m. IMPRESSION: 1. No large vessel occlusion. 2. Intracranial atherosclerosis including moderate right and severe left V4 stenoses and a severe right P2 stenosis. 3. Mild cervical carotid atherosclerosis without stenosis. 4. No evidence of  an acute infarct or penumbra on CTP. 5.  Aortic Atherosclerosis (ICD10-I70.0). Electronically Signed   By: Sebastian Ache M.D.   On: 04/04/2023 15:50   CT HEAD CODE STROKE WO CONTRAST  Result Date: 04/04/2023 CLINICAL DATA:  Code stroke.  Neuro deficit, acute, stroke suspected EXAM: CT HEAD WITHOUT CONTRAST TECHNIQUE: Contiguous axial images were obtained from the base of the skull through the vertex without intravenous contrast. RADIATION DOSE REDUCTION: This exam was performed according to the departmental dose-optimization program which includes automated exposure control, adjustment of the mA and/or kV according to patient size and/or use of iterative reconstruction technique. COMPARISON:  03/15/22 Head CT FINDINGS: Brain: Hemorrhage. No hydrocephalus. No extra-axial fluid collection. No mass effect. No mass lesion. 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 No CT evidence of an acute cortical infarct Vascular: No hyperdense vessel or unexpected calcification. Skull: Normal. Negative for fracture or focal lesion. Sinuses/Orbits: No middle ear or mastoid effusion. Paranasal sinuses are notable for mucosal thickening in the right ethmoid sinus. Bilateral lens replacement. Orbits are otherwise unremarkable. Other: None. ASPECTS [facility] Stroke Program Early CT Score): 10 when accounting for chronic findings IMPRESSION: 1. No hemorrhage or CT evidence of an acute cortical infarct. 2. 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 Findings were paged to Dr. Iver Nestle on 04/04/2023 at 3:25 p.m. Electronically Signed   By: Lorenza Cambridge M.D.   On: 04/04/2023 15:25    Vitals:   04/05/23 1100 04/05/23 1200 04/05/23 1242 04/05/23 1245  BP: 128/86 126/62    Pulse: 78 82 87 85  Resp: 18 19 19  (!) 21  Temp:      TempSrc:      SpO2: 100% 98% 98% 100%  Weight:      Height:          PHYSICAL EXAM General:  Alert, disheveled elderly man Psych:  Mood and affect appropriate for situation CV: Regular rate and rhythm on monitor Respiratory:  Regular, unlabored respirations on room air GI: Abdomen soft and nontender   NEURO:  Mental Status: [name] confused he is oriented to himself unable to state the correct month or year president or situation.  Mild dysarthria  Cranial Nerves:  II: PERRL. Visual fields full.  III, IV, VI: EOMI. Eyelids elevate symmetrically.  V: Sensation is intact to light touch and symmetrical to face.  VII: Subtle right facial droop VIII: hearing intact to voice. IX, X: Palate elevates symmetrically. Phonation is normal.  UX:[id number] shrug 5/5. XII: tongue is midline without fasciculations. Motor: 5/5 strength to all muscle groups tested.  Tone: is normal and bulk is normal Sensation- Intact to light touch bilaterally. Extinction absent to light touch to DSS.   Coordination: FTN intact bilaterally, HKS: no ataxia in BLE.No drift.  Gait- deferred  Most Recent NIH   1a Level of Conscious.: 0 1b LOC Questions: 2 1c LOC Commands: 0 2 Best Gaze: 0 3 Visual: 0 4 Facial Palsy: 1 5a Motor Arm - left: 0 5b Motor Arm - Right: 0 6a Motor Leg - Left: 0 6b Motor Leg - Right: 0 7 Limb Ataxia: 0 8 Sensory: 0 9 Best Language: 0 10 Dysarthria: 1 11 Extinct. and Inatten.: 0 TOTAL: 4   ASSESSMENT/PLAN  Acute Ischemic Infarct:  left ganglia and corona radiata, left posterior temporal cortex and left pons  Etiology: Normal vessel disease Code Stroke CT head No acute abnormality. Small vessel disease. ASPECTS 10.  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 intracranial atherosclerosis including moderate right and severe left V4 stenoses and a severe right P2 stenosis. CT perfusion no acute infarction or penumbra MRI acute ischemic infarcts in left basal ganglia and corona radiata, left posterior temporal  cortex and left pons EEG This study is within normal limits. No seizures or epileptiform discharges were seen throughout the recording.  Carotid Doppler ordered 2D Echo EF 50 to 55%. Recommend 30 Day heart monitor or loop recorder LDL 106 HgbA1c 6.7 VTE prophylaxis -Lovenox No antithrombotic prior to admission, now on aspirin 81 mg daily and clopidogrel 75 mg daily for 3 weeks and then aspirin alone. 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 Yes   Advance diet as tolerated  Other Stroke Risk Factors Obstructive sleep apnea, not on CPAP at home   Other Active Problems Anxiety and depression  Hospital day # 0   Gevena Mart DNP, ACNPC-AG  [facility]  I have personally obtained history,examined this patient, reviewed notes, independently viewed imaging studies, participated in medical decision making and plan of care.ROS completed by me personally and pertinent positives fully documented  I have made any additions or clarifications directly to the above note. Agree with note above.  Patient is a poor historian and does not remember what happened apparently was brought in by a syncopal episode and MRI shows multiple acute infarcts in left basal ganglia, corona radiator, left posterior temporal cortex and posterior left pons.  He has no memory of these events and what happened yesterday.  He denies prior history of atrial fibrillation significant cardiac or neurological events.  Recommend cardiac monitoring for paroxysmal A-fib.  Check EEG for  seizure activity.  Mobilize out of bed.  Therapy consults.  Aspirin and Plavix for 3 weeks followed by aspirin alone and aggressive risk factor modification.  He will need prolonged cardiac monitoring at discharge to look for paroxysmal A-fib.  No family at the bedside.  Greater than 50% time during this 50-minute visit was spent in counseling and coordination of care about his multiple strokes and discussion about evaluation, prevention and treatment and answering questions.    Delia Heady, MD Medical Director  [facility] Pager: [phone] 04/05/2023 3:01 PM   To contact Stroke Continuity provider, please refer to [email or web address]. After hours, contact General Neurology

## 2023-04-05 NOTE — Progress Notes (Signed)
EEG complete - results pending 

## 2023-04-05 NOTE — Plan of Care (Signed)

## 2023-04-05 NOTE — Procedures (Addendum)
Patient Name: ARTUR PUPILLO  MRN: [id number]  Epilepsy Attending: Charlsie Quest  Referring Physician/Provider: Kara Mead, NP  Date: 04/05/2023 Duration: 25.37 mins  Patient history: 67yo M with syncope getting eeg to evaluate for seizure  Level of alertness: Awake  AEDs during EEG study: None  Technical aspects: This EEG study was done with scalp electrodes positioned according to the 10-20 International system of electrode placement. Electrical activity was reviewed with band pass filter of 1-70Hz , sensitivity of 7 uV/mm, display speed of 70mm/sec with a 60Hz  notched filter applied as appropriate. EEG data were recorded continuously and digitally stored.  Video monitoring was available and reviewed as appropriate.  Description: The posterior dominant rhythm consists of 8 Hz activity of moderate voltage (25-35 uV) seen predominantly in posterior head regions, symmetric and reactive to eye opening and eye closing. Physiologic photic driving was not seen during photic stimulation.  Hyperventilation was not performed.     IMPRESSION: This study is within normal limits. No seizures or epileptiform discharges were seen throughout the recording.  A normal interictal EEG does not exclude the diagnosis of epilepsy.  Kjirsten Bloodgood Annabelle Harman

## 2023-04-05 NOTE — Progress Notes (Addendum)
Daily Progress Note Intern Pager: [phone]  Patient name: Jacob [name] Medical record number: [id number] Date of birth: October 03, 1955 Age: 67 y.o. Gender: male  Primary Care Provider: [name], DO Consultants: Neurology Code Status: FULL  Pt Overview and Major Events to Date:  12/3 - Admitted, multiple infarctions on MRI   Assessment and Plan: Jacob [name] is a 67 y.o. male with a pertinent PMH of HLD, HTN, T2DM, OSA, MDD, prostate cancer status post salvage radiation treatment who presented with slurred speech and R side weakness, admitted for CVA, currently undergoing stroke workup.  Patient undergoing workup for stroke etiology, cardioembolic source is a consideration to microvascular disease or carotid stenosis remain possibilities.  Awaiting carotid Dopplers and TTE.  Given some chronic microvascular changes on CT, suspect he may have had some prior strokes as well.  Following neurology lead on anticoagulation.  This morning, patient does not have any focal neurological deficit on exam, however does appear to be somewhat confused and apraxic.  Baseline mental status not fully clear, but suspect progressive dementia given the mental status and chronic microvascular infarcts on imaging.  After speaking with his sister, suspect progressive dementia versus severe depression.  It is unlikely patient will be able to go back to independent living safely after this episode, and will likely need to be discharged to SNF pending PT recommendations.  Will benefit from workup for reversible causes of cognitive decline outpatient.  Expect patient to be medically ready for discharge tomorrow pending results of imaging. Assessment & Plan Acute cerebral infarction [facility]) MRI brain showing acute infarction left basal ganglia/corona radiata, left posterior temporal cortex, posterior left pons without hemorrhage.  No focal neurological deficit, no post CVA deficits identified on exam.   Continuing post stroke optimization as below. -Imaging: Carotid Dopplers, TTE pending -c/s SLP, PT, OT, follow-up recs -Cardiac monitoring 24-48 hours -A1c 6.7, LDL 106; optimize risk factors as below -Neuro stroke team following, follow up recs:  -Permissive HTN up to 220/120 x 24-48 hours, hold home amlodipine for now  -Follow up clopidogrel/AC recs Type 2 diabetes mellitus with diabetic neuropathy, without long-term current use of insulin (HCC) A1c 6.7 this admission, goal <7.  Not on any medications outpatient. -Sensitive SSI -CBGs ACHS Mixed hyperlipidemia LDL 106 this admission, goal <70. -Start atorvastatin 80 mg for high intensity therapy Dysuria Complaining of dysuria on admission, now with some possible suprapubic fullness.  Does have prostate cancer and did receive salvage radiation treatment directed to the prostatic fossa and pelvic lymph nodes and combination of ADT starting in September.  Was not retaining urine at PCP visit 10/28, but will ensure that is still true. -Bladder scan Q8h x 3 occurrences -Strict I's and O's Hypokalemia K 3.8 status post repletion yesterday, goal >3.5. Dementia, unspecified dementia severity, unspecified dementia type, unspecified whether behavioral, psychotic, or mood disturbance or anxiety [facility]) Chart review shows PCP Dr. Rexene Alberts noted on 10/28 that sisters think about nursing home placement, patient is reluctant because of 3 cats at home, there was discussion of starting paperwork for HPOA.  Noted the patient has a history of severe untreated depression, could be contributing to baseline confusion. -TOC consult for placement -Contact sister for collateral  Chronic and Stable Problems: Anxiety/Depression: Restart venlafaxine XR 37.5 mg daily HLD: Start atorvastatin 80 mg daily as above  FEN/GI: Carb modified diet PPx: Enoxaparin Dispo: Pending PT recommendations and stroke workup, likely 1-2 days.  Subjective:  This morning, patient  states that he  feels "weak" and when further prompted, states he feels weak in lower abdomen.  Denies any pain, shortness of breath.  Speaks fluently without dysarthria.  During patient evaluation, patient's sister Jacob [name] calls room.  She reports significant concern that patient may not be able to live independently any longer.  He did live at home alone prior to this episode.  She has been concerned about his cognitive deterioration and possible depression for some time, much of this is noted on PCP note from 10/28 visit.  Objective: Temp:  [97.3 F (36.3 C)-98.7 F (37.1 C)] 98.4 F (36.9 C) (12/04 0630) Pulse Rate:  [65-97] 65 (12/04 0630) Resp:  [10-21] 10 (12/04 0630) BP: (122-153)/(74-99) 153/86 (12/04 0615) SpO2:  [92 %-99 %] 95 % (12/04 0630) Weight:  [79.8 kg] 79.8 kg (12/03 1601)  Physical Exam: General: Older adult male, resting comfortably in bed, NAD.  Alert and oriented to self and place, but not date or situation. Cardiovascular: Regular rate and rhythm. Normal S1/S2. No murmurs, rubs, or gallops appreciated. 2+ radial pulses. Pulmonary: Clear bilaterally to ascultation. No increased WOB, no accessory muscle usage on room air. No wheezes, rales, or crackles. Abdominal: Mild suprapubic tenderness, possibly some mild distention.  Normoactive bowel sounds. No tenderness to deep or light palpation in other quadrants. No rebound or guarding. Skin: Warm and dry.  No rashes grossly. Extremities: No peripheral edema bilaterally. Psych: Flat affect, quiet.  Repeats self occasionally.  Neurological Examination: MS: Awake, interactive. Normal eye contact, answered basic questions appropriately, speech was fluent, normal comprehension.  Attention was somewhat decreased, and concentration was normal. Cranial Nerves: Pupils were equal and reactive to light (5-49mm);  EOM normal, no nystagmus, no ptsosis, no double vision; intact facial sensation; face symmetric with full strength of  facial muscles; palate elevation is symmetric; tongue protrusion is symmetric with full movement to both sides; sternocleidomastoid and trapezius are with normal strength. Tone: Normal. Strength: Normal strength in all muscle groups, BLE and BUE tested.  No pronator drift. Sensation: Intact and equal to light touch across face, BLE, BUE. Coordination: No dysmetria on FTN test bilaterally.  Heel-to-shin normal bilaterally.  Gait not assessed.   Laboratory: Most recent CBC Lab Results  Component Value Date   WBC 9.2 04/04/2023   HGB 12.6 (L) 04/04/2023   HCT 37.0 (L) 04/04/2023   MCV 82.2 04/04/2023   PLT 158 04/04/2023   Most recent BMP    Latest Ref Rng & Units 04/05/2023    2:41 AM  BMP  Glucose 70 - 99 mg/dL 528   BUN 8 - 23 mg/dL 12   Creatinine 4.13 - 1.24 mg/dL 2.44   Sodium 010 - 272 mmol/L 139   Potassium 3.5 - 5.1 mmol/L 3.8   Chloride 98 - 111 mmol/L 106   CO2 22 - 32 mmol/L 25   Calcium 8.9 - 10.3 mg/dL 8.3     Other pertinent labs: -LDL 103 -A1c 6.7 -TSH WNL -UA WNL -UDS negative  New Imaging/Diagnostic Tests: -MRI brain: MRI brain showing acute infarction left basal ganglia/corona radiata, left posterior temporal cortex, posterior left pons without hemorrhage. 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 FPTS Intern pager: [phone], text pages welcome Secure chat group [facility]  Teaching Service

## 2023-04-05 NOTE — Progress Notes (Signed)
PT Cancellation Note  Patient Details Name: [name] MRN: [id number] DOB: [birth year]   Cancelled Treatment:    Reason Eval/Treat Not Completed: Patient at procedure or test/unavailable (Pt actively getting EEG study done. Will follow up later if time allows.)   Gladys Damme 04/05/2023, 9:25 AM

## 2023-04-05 NOTE — ED Notes (Signed)
ED TO INPATIENT HANDOFF REPORT  ED Nurse Name and Phone #: Beatris Ship RN [phone]  S Name/Age/Gender Jacob [name] 67 y.o. male Room/Bed: 043C/043C  Code Status   Code Status: Full Code  Home/SNF/Other Home Patient oriented to: self and place Is this baseline? Yes   Triage Complete: Triage complete  Chief Complaint TIA (transient ischemic attack) [G45.9] CVA (cerebral vascular accident) (HCC) [I63.9]  Triage Note PT BIB [facility] after PT experienced syncopal episode while at their urologist at 1340. PT family states they began noticing slurred speech 1-2 days prior. PT has hx early dementia. R sided deviation and deficits noted by EMS. O2 in 80's with EMS, NPA and NRB given at 15L. Pressure 124/72, HR 95, 223 CBG per [facility]. PT able to follow verbal commands, not oriented to time or place.GCS 14.    Allergies Allergies  Allergen Reactions   Methylprednisolone Sodium Succ     Respiratory failure Pt states he has never had respiratory failure, does not recall this reaction   Ms Contin [Morphine] Nausea Only    Level of Care/Admitting Diagnosis ED Disposition     ED Disposition  Admit   Condition  --   Comment  Hospital Area: [facility] [[id number]]  Level of Care: Telemetry Medical [104]  May admit patient to [facility] or [facility] if equivalent level of care is available:: No  Covid Evaluation: Asymptomatic - no recent exposure (last 10 days) testing not required  Diagnosis: CVA (cerebral vascular accident) [facility]) [[id number]]  Admitting Physician: Westley Chandler [[id number]]  Attending Physician: Westley Chandler [[id number]]  Certification:: I certify this patient will need inpatient services for at least 2 midnights          B Medical/Surgery History Past Medical History:  Diagnosis Date   Anxiety    Arthritis    "hurt qwhere in my bones; especially early in the morning" (06/08/2016)   Chronic cough 08/10/2011   more than 6 yrs from sinus drainage    Colonic polyp 08/04/2011   08/2011: colonoscopy and biopsies:   Descending colon: tubular adenoma>>>no high grade dysplasia or malignancy   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 meds   Increased urinary frequency    Kidney stones 08/10/2011   once in past, none recent   Methylprednisolone-induced hypoxia 08/23/2011   Neuromuscular disorder (HCC) 08/10/2011   "burning/tingling sensation In feet"   Obesity    Obesity, Class II, BMI 35-39.9, with comorbidity 06/29/2006   OSA (obstructive sleep apnea)    "couldn't deal w/CPAP" (06/08/2016)   Preoperative clearance    Prostate cancer (HCC)    RUQ pain 06/09/2016   Shortness of breath 08/10/2011   with increased activity only   Syncope and collapse 06/09/2016   Syncope and collapse 06/09/2016   Type II  diabetes mellitus (HCC)    Umbilical hernia    Past Surgical History:  Procedure Laterality Date   CATARACT EXTRACTION W/ INTRAOCULAR LENS  IMPLANT, BILATERAL Bilateral 08/10/2011 - ~ 2014   "right - left"   CHOLECYSTECTOMY N/A 06/10/2016   Procedure: LAPAROSCOPIC CHOLECYSTECTOMY WITH INTRAOPERATIVE CHOLANGIOGRAM;  Surgeon: Harriette Bouillon, MD;  Location: [facility];  Service: General;  Laterality: N/A;   COLONOSCOPY W/ BIOPSIES AND POLYPECTOMY  2013   HERNIA REPAIR     LIPOMA EXCISION Right  10/18/2018   Procedure: EXCISION RIGHT BACK  LIPOMA X 2;  Surgeon: Harriette Bouillon, MD;  Location: [facility];  Service: General;  Laterality: Right;   LYMPHADENECTOMY Bilateral 02/14/2022   Procedure: LYMPHADENECTOMY, PELVIC;  Surgeon: Heloise Purpura, MD;  Location: [facility];  Service: Urology;  Laterality: Bilateral;   PROSTATE BIOPSY     ROBOT ASSISTED LAPAROSCOPIC RADICAL PROSTATECTOMY N/A 02/14/2022   Procedure: XI ROBOTIC ASSISTED LAPAROSCOPIC RADICAL PROSTATECTOMY LEVEL 2;  Surgeon: Heloise Purpura, MD;  Location: [facility];  Service: Urology;  Laterality: N/A;   VENTRAL HERNIA REPAIR  08/22/2011   Procedure: HERNIA REPAIR VENTRAL ADULT;  Surgeon: Ernestene Mention, MD;  Location: [facility];  Service: General;  Laterality: N/A;  incarcerated     A IV Location/Drains/Wounds Patient Lines/Drains/Airways Status     Active Line/Drains/Airways     Name Placement date Placement time Site Days   Peripheral IV 04/04/23 20 G Left Antecubital 04/04/23  1806  Antecubital  1            Intake/Output Last 24 hours No intake or output data in the 24 hours ending 04/05/23 1342  Labs/Imaging Results for orders placed or performed during the hospital encounter of 04/04/23 (from the past 48 hour(s))  CBG monitoring, ED     Status: Abnormal   Collection Time: 04/04/23  3:15 PM  Result Value Ref Range   Glucose-Capillary 179 (H) 70 - 99 mg/dL    Comment: Glucose reference range applies only to samples taken after fasting for at least 8 hours.   Comment 1 Notify RN    Comment 2 Document in Chart   Hemoglobin A1c     Status: Abnormal   Collection Time: 04/04/23  3:15 PM  Result Value Ref Range   Hgb A1c MFr Bld 6.7 (H) 4.8 - 5.6 %    Comment: (NOTE) Pre diabetes:          5.7%-6.4%  Diabetes:              >6.4%  Glycemic control for   <7.0% adults with diabetes    Mean Plasma Glucose 145.59 mg/dL    Comment: Performed at [facility], [address] [place],  [place] [address]  Protime-INR     Status: None   Collection Time: 04/04/23  3:16 PM  Result Value Ref Range   Prothrombin Time 13.8 11.4 - 15.2 seconds   INR 1.0 0.8 - 1.2    Comment: (NOTE) INR goal varies based on device and disease states. Performed at [facility], [address] [place], [place] [address]   APTT     Status: None   Collection Time: 04/04/23  3:16 PM  Result Value Ref Range   aPTT 27 24 - 36 seconds    Comment: Performed at [facility], [address], [place], [place] [address]  CBC     Status: Abnormal   Collection Time: 04/04/23  3:16 PM  Result  Value Ref Range   WBC 9.2 4.0 - 10.5 K/uL   RBC 4.43 4.22 - 5.81 MIL/uL   Hemoglobin 12.4 (L) 13.0 - 17.0 g/dL   HCT 62.9 (L) 52.8 - 41.3 %   MCV 82.2 80.0 - 100.0 fL   MCH 28.0 26.0 - 34.0 pg   MCHC 34.1 30.0 - 36.0 g/dL   RDW 24.4 01.0 - 27.2 %   Platelets 158 150 - 400 K/uL   nRBC 0.0 0.0 - 0.2 %    Comment: Performed at [facility], [address] [place], [place] [address]  Differential     Status: Abnormal   Collection Time: 04/04/23  3:16 PM  Result Value Ref Range   Neutrophils Relative % 75 %   Neutro Abs 6.8 1.7 - 7.7 K/uL   Lymphocytes Relative 18 %   Lymphs Abs 1.6 0.7 - 4.0 K/uL   Monocytes Relative 5 %   Monocytes Absolute 0.5 0.1 - 1.0 K/uL   Eosinophils Relative 1 %   Eosinophils Absolute 0.1 0.0 - 0.5 K/uL   Basophils Relative 0 %   Basophils Absolute 0.0 0.0 - 0.1 K/uL   Immature Granulocytes 1 %   Abs Immature Granulocytes 0.11 (H) 0.00 - 0.07 K/uL    Comment: Performed at [facility], [address] [place], [place] [address]  Comprehensive metabolic panel     Status: Abnormal   Collection Time: 04/04/23  3:16 PM  Result Value Ref Range   Sodium 138 135 - 145 mmol/L   Potassium 3.3 (L) 3.5 - 5.1 mmol/L   Chloride 104 98 - 111 mmol/L   CO2 24 22 - 32 mmol/L   Glucose, Bld 210 (H) 70 - 99 mg/dL    Comment: Glucose reference range applies only to samples taken  after fasting for at least 8 hours.   BUN 13 8 - 23 mg/dL   Creatinine, Ser 4.25 0.61 - 1.24 mg/dL   Calcium 8.6 (L) 8.9 - 10.3 mg/dL   Total Protein 7.0 6.5 - 8.1 g/dL   Albumin 3.3 (L) 3.5 - 5.0 g/dL   AST 15 15 - 41 U/L   ALT 16 0 - 44 U/L   Alkaline Phosphatase 66 38 - 126 U/L   Total Bilirubin 0.8 <1.2 mg/dL   GFR, Estimated >95 >63 mL/min    Comment: (NOTE) Calculated using the CKD-EPI Creatinine Equation (2021)    Anion gap 10 5 - 15    Comment: Performed at [facility], [address], [place], [place] [address]  Ethanol     Status: None   Collection Time: 04/04/23  3:16 PM  Result Value Ref Range   Alcohol, Ethyl (B) <10 <10 mg/dL    Comment: (NOTE) Lowest detectable limit for serum alcohol is 10 mg/dL.  For medical purposes only. Performed at [facility], [address] [place], [place] [address]   Lipid panel     Status: Abnormal   Collection Time: 04/04/23  3:16 PM  Result Value Ref Range   Cholesterol 194 0 - 200 mg/dL   Triglycerides 188 (H) <150 mg/dL   HDL 37 (L) >41 mg/dL   Total CHOL/HDL Ratio 5.2 RATIO   VLDL 51 (H) 0 - 40 mg/dL   LDL Cholesterol 660 (H) 0 - 99 mg/dL    Comment:        Total Cholesterol/HDL:CHD Risk Coronary Heart Disease Risk Table  Men   Women  1/2 Average Risk   3.4   3.3  Average Risk       5.0   4.4  2 X Average Risk   9.6   7.1  3 X Average Risk  23.4   11.0        Use the calculated Patient Ratio above and the CHD Risk Table to determine the patient's CHD Risk.        ATP III CLASSIFICATION (LDL):  <100     mg/dL   Optimal  130-865  mg/dL   Near or Above                    Optimal  130-159  mg/dL   Borderline  784-696  mg/dL   High  >295     mg/dL   Very High Performed at [facility], [address] [place], [place] [address]   I-stat chem 8, ED     Status: Abnormal   Collection Time: 04/04/23  3:20 PM  Result Value Ref Range   Sodium 139 135 - 145 mmol/L   Potassium 3.4  (L) 3.5 - 5.1 mmol/L   Chloride 103 98 - 111 mmol/L   BUN 15 8 - 23 mg/dL   Creatinine, Ser 2.44 0.61 - 1.24 mg/dL   Glucose, Bld 010 (H) 70 - 99 mg/dL    Comment: Glucose reference range applies only to samples taken after fasting for at least 8 hours.   Calcium, Ion 1.05 (L) 1.15 - 1.40 mmol/L   TCO2 22 22 - 32 mmol/L   Hemoglobin 12.6 (L) 13.0 - 17.0 g/dL   HCT 27.2 (L) 53.6 - 64.4 %  Urinalysis, w/ Reflex to Culture (Infection Suspected) -Urine, Clean Catch     Status: Abnormal   Collection Time: 04/04/23 11:44 PM  Result Value Ref Range   Specimen Source URINE, CLEAN CATCH    Color, Urine YELLOW YELLOW   APPearance CLEAR CLEAR   Specific Gravity, Urine >1.046 (H) 1.005 - 1.030   pH 5.0 5.0 - 8.0   Glucose, UA NEGATIVE NEGATIVE mg/dL   Hgb urine dipstick NEGATIVE NEGATIVE   Bilirubin Urine NEGATIVE NEGATIVE   Ketones, ur NEGATIVE NEGATIVE mg/dL   Protein, ur NEGATIVE NEGATIVE mg/dL   Nitrite NEGATIVE NEGATIVE   Leukocytes,Ua NEGATIVE NEGATIVE   RBC / HPF 0-5 0 - 5 RBC/hpf   WBC, UA 0-5 0 - 5 WBC/hpf    Comment:        Reflex urine culture not performed if WBC <=10, OR if Squamous epithelial cells >5. If Squamous epithelial cells >5 suggest recollection.    Bacteria, UA NONE SEEN NONE SEEN   Squamous Epithelial / HPF 0-5 0 - 5 /HPF   Mucus PRESENT     Comment: Performed at [facility], [address], [place], [place] [address]  Rapid urine drug screen (hospital performed)     Status: None   Collection Time: 04/04/23 11:44 PM  Result Value Ref Range   Opiates NONE DETECTED NONE DETECTED   Cocaine NONE DETECTED NONE DETECTED   Benzodiazepines NONE DETECTED NONE DETECTED   Amphetamines NONE DETECTED NONE DETECTED   Tetrahydrocannabinol NONE DETECTED NONE DETECTED   Barbiturates NONE DETECTED NONE DETECTED    Comment: (NOTE) DRUG SCREEN FOR MEDICAL PURPOSES ONLY.  IF CONFIRMATION IS NEEDED FOR ANY PURPOSE, NOTIFY LAB WITHIN 5 DAYS.  LOWEST DETECTABLE  LIMITS FOR URINE DRUG SCREEN Drug Class  Cutoff (ng/mL) Amphetamine and metabolites    1000 Barbiturate and metabolites    200 Benzodiazepine                 200 Opiates and metabolites        300 Cocaine and metabolites        300 THC                            50 Performed at [facility], [address], [place], [place] [address]   HIV Antibody (routine testing w rflx)     Status: None   Collection Time: 04/05/23  2:41 AM  Result Value Ref Range   HIV Screen 4th Generation wRfx Non Reactive Non Reactive    Comment: Performed at [facility], [address], [place], [place] [address]  TSH     Status: None   Collection Time: 04/05/23  2:41 AM  Result Value Ref Range   TSH 1.460 0.350 - 4.500 uIU/mL    Comment: Performed by a 3rd Generation assay with a functional sensitivity of <=0.01 uIU/mL. Performed at [facility], [address], [place], [place] [address]   Basic metabolic panel     Status: Abnormal   Collection Time: 04/05/23  2:41 AM  Result Value Ref Range   Sodium 139 135 - 145 mmol/L   Potassium 3.8 3.5 - 5.1 mmol/L   Chloride 106 98 - 111 mmol/L   CO2 25 22 - 32 mmol/L   Glucose, Bld 123 (H) 70 - 99 mg/dL    Comment: Glucose reference range applies only to samples taken after fasting for at least 8 hours.   BUN 12 8 - 23 mg/dL   Creatinine, Ser 5.95 0.61 - 1.24 mg/dL   Calcium 8.3 (L) 8.9 - 10.3 mg/dL   GFR, Estimated >63 >87 mL/min    Comment: (NOTE) Calculated using the CKD-EPI Creatinine Equation (2021)    Anion gap 8 5 - 15    Comment: Performed at [facility], [address] [place], [place] [address]  CBG monitoring, ED     Status: Abnormal   Collection Time: 04/05/23  8:22 AM  Result Value Ref Range   Glucose-Capillary 144 (H) 70 - 99 mg/dL    Comment: Glucose reference range applies only to samples taken after fasting for at least 8 hours.  CBG monitoring, ED     Status: Abnormal   Collection Time:  04/05/23 12:43 PM  Result Value Ref Range   Glucose-Capillary 164 (H) 70 - 99 mg/dL    Comment: Glucose reference range applies only to samples taken after fasting for at least 8 hours.   ECHOCARDIOGRAM COMPLETE  Result Date: 04/05/2023    ECHOCARDIOGRAM REPORT   Patient Name:   Jacob [name] Date of Exam: 04/05/2023 Medical Rec #:  [id number]       Height:       69.0 in Accession #:    [id number]      Weight:       176.0 lb Date of Birth:  1956-02-05        BSA:          1.957 m Patient Age:    67 years        BP:           153/89 mmHg Patient Gender: M  HR:           85 bpm. Exam Location:  Inpatient Procedure: 2D Echo, Cardiac Doppler and Color Doppler Indications:    Stroke I63.9  History:        Patient has prior history of Echocardiogram examinations, most                 recent 05/25/2021. TIA, Signs/Symptoms:Chest Pain and Syncope;                 Risk Factors:Hypertension, Sleep Apnea, Diabetes and                 Dyslipidemia.  Sonographer:    Lucendia Herrlich RCS Referring Phys: [id number] Darl Householder RUMBALL IMPRESSIONS  1. Left ventricular ejection fraction, by estimation, is 50 to 55%. The left ventricle has low normal function. The left ventricle has no regional wall motion abnormalities. Left ventricular diastolic parameters are indeterminate.  2. Right ventricular systolic function is normal. The right ventricular size is normal.  3. The mitral valve is normal in structure. No evidence of mitral valve regurgitation. No evidence of mitral stenosis.  4. The aortic valve is normal in structure. Aortic valve regurgitation is mild. Mild aortic valve stenosis.  5. The inferior vena cava is normal in size with greater than 50% respiratory variability, suggesting right atrial pressure of 3 mmHg. FINDINGS  Left Ventricle: Left ventricular ejection fraction, by estimation, is 50 to 55%. The left ventricle has low normal function. The left ventricle has no regional wall motion abnormalities. The  left ventricular internal cavity size was normal in size. There is no left ventricular hypertrophy. Left ventricular diastolic parameters are indeterminate. Right Ventricle: The right ventricular size is normal. No increase in right ventricular wall thickness. Right ventricular systolic function is normal. Left Atrium: Left atrial size was normal in size. Right Atrium: Right atrial size was normal in size. Pericardium: There is no evidence of pericardial effusion. Mitral Valve: The mitral valve is normal in structure. No evidence of mitral valve regurgitation. No evidence of mitral valve stenosis. Tricuspid Valve: The tricuspid valve is normal in structure. Tricuspid valve regurgitation is not demonstrated. No evidence of tricuspid stenosis. Aortic Valve: The aortic valve is normal in structure. Aortic valve regurgitation is mild. Mild aortic stenosis is present. Aortic valve mean gradient measures 16.0 mmHg. Aortic valve peak gradient measures 26.1 mmHg. Aortic valve area, by VTI measures 1.02 cm. Pulmonic Valve: The pulmonic valve was normal in structure. Pulmonic valve regurgitation is not visualized. No evidence of pulmonic stenosis. Aorta: The aortic root is normal in size and structure. Venous: The inferior vena cava is normal in size with greater than 50% respiratory variability, suggesting right atrial pressure of 3 mmHg. IAS/Shunts: No atrial level shunt detected by color flow Doppler.  LEFT VENTRICLE PLAX 2D LVIDd:         4.80 cm   Diastology LVIDs:         3.40 cm   LV e' medial:    4.29 cm/s LV PW:         0.70 cm   LV E/e' medial:  15.5 LV IVS:        0.90 cm   LV e' lateral:   4.99 cm/s LVOT diam:     1.90 cm   LV E/e' lateral: 13.3 LV SV:         45 LV SV Index:   23 LVOT Area:     2.84 cm  RIGHT VENTRICLE  IVC RV S prime:     20.70 cm/s  IVC diam: 1.80 cm TAPSE (M-mode): 2.7 cm LEFT ATRIUM             Index        RIGHT ATRIUM           Index LA diam:        3.10 cm 1.58 cm/m   RA  Area:     12.70 cm LA Vol (A2C):   41.6 ml 21.26 ml/m  RA Volume:   29.90 ml  15.28 ml/m LA Vol (A4C):   44.5 ml 22.74 ml/m LA Biplane Vol: 46.2 ml 23.61 ml/m  AORTIC VALVE AV Area (Vmax):    1.10 cm AV Area (Vmean):   0.94 cm AV Area (VTI):     1.02 cm AV Vmax:           255.20 cm/s AV Vmean:          184.000 cm/s AV VTI:            0.437 m AV Peak Grad:      26.1 mmHg AV Mean Grad:      16.0 mmHg LVOT Vmax:         99.30 cm/s LVOT Vmean:        60.800 cm/s LVOT VTI:          0.157 m LVOT/AV VTI ratio: 0.36  AORTA Ao Root diam: 3.40 cm Ao Asc diam:  3.50 cm MITRAL VALVE               TRICUSPID VALVE MV Area (PHT): 3.31 cm    TR Peak grad:   9.0 mmHg MV Decel Time: 229 msec    TR Vmax:        150.00 cm/s MV E velocity: 66.60 cm/s MV A velocity: 98.90 cm/s  SHUNTS MV E/A ratio:  0.67        Systemic VTI:  0.16 m                            Systemic Diam: 1.90 cm Kardie Tobb DO Electronically signed by Thomasene Ripple DO Signature Date/Time: 04/05/2023/12:26:48 PM    Final    EEG adult  Result Date: 04/05/2023 Charlsie Quest, MD     04/05/2023 10:30 AM Patient Name: PEIGHTON SPERRAZZA MRN: [id number] Epilepsy Attending: Charlsie Quest Referring Physician/Provider: Kara Mead, NP Date: 04/05/2023 Duration: 25.37 mins Patient history: 67yo M with syncope getting eeg to evaluate for seizure Level of alertness: Awake AEDs during EEG study: None Technical aspects: This EEG study was done with scalp electrodes positioned according to the 10-20 International system of electrode placement. Electrical activity was reviewed with band pass filter of 1-70Hz , sensitivity of 7 uV/mm, display speed of 69mm/sec with a 60Hz  notched filter applied as appropriate. EEG data were recorded continuously and digitally stored.  Video monitoring was available and reviewed as appropriate. Description: The posterior dominant rhythm consists of 8 Hz activity of moderate voltage (25-35 uV) seen predominantly in posterior head regions,  symmetric and reactive to eye opening and eye closing. Physiologic photic driving was not seen during photic stimulation.  Hyperventilation was not performed.   IMPRESSION: This study is within normal limits. No seizures or epileptiform discharges were seen throughout the recording. A normal interictal EEG does not exclude the diagnosis of epilepsy. Charlsie Quest   MR BRAIN WO CONTRAST  Result Date: 04/04/2023 CLINICAL  DATA:  Syncopal episode, slurred speech, right-sided deviation and deficits EXAM: MRI HEAD WITHOUT CONTRAST TECHNIQUE: Multiplanar, multiecho pulse sequences of the brain and surrounding structures were obtained without intravenous contrast. COMPARISON:  03/15/2022 MRI head, 04/04/2023 CT head FINDINGS: Brain: Restricted diffusion with ADC correlate in the left basal ganglia/corona radiata (series 5, images 85-86 and series 7, image 59), left posterior temporal cortex (series 5, image 78), and posterior left pons (series 5, image 70), which are concerning for acute infarcts. No acute hemorrhage, mass, mass effect, or midline shift. No hydrocephalus or extra-axial collection. Craniocervical junction within normal limits. Multiple punctate foci of hemosiderin deposition in the cerebral hemispheres, right basal ganglia, and right thalamus, likely sequela prior hypertensive microhemorrhages. No evidence of superficial siderosis. Advanced cerebral volume loss for age. Confluent T2 hyperintense signal in the periventricular white matter, likely the sequela of moderate to severe chronic small vessel ischemic disease. Remote lacunar infarcts in the thalami and basal ganglia. No significant remote cortical infarct. Vascular: Normal arterial flow voids. Skull and upper cervical spine: Normal marrow signal. Sinuses/Orbits: Clear paranasal sinuses. No acute finding in the orbits. Status post bilateral lens replacements. Other: Trace fluid in the mastoid air cells. IMPRESSION: Acute infarcts in the left  basal ganglia/corona radiata, left posterior temporal cortex, and posterior left pons. No acute hemorrhage. Electronically Signed   By: Wiliam Ke M.D.   On: 04/04/2023 18:42   CT ANGIO HEAD NECK W WO CM W PERF (CODE STROKE)  Result Date: 04/04/2023 CLINICAL DATA:  Neuro deficit, acute, stroke suspected. Right-sided weakness. EXAM: CT ANGIOGRAPHY HEAD AND NECK CT PERFUSION BRAIN TECHNIQUE: Multidetector CT imaging of the head and neck was performed using the standard protocol during bolus administration of intravenous contrast. Multiplanar CT image reconstructions and MIPs were obtained to evaluate the vascular anatomy. Carotid stenosis measurements (when applicable) are obtained utilizing NASCET criteria, using the distal internal carotid diameter as the denominator. Multiphase CT imaging of the brain was performed following IV bolus contrast injection. Subsequent parametric perfusion maps were calculated using RAPID software. RADIATION DOSE REDUCTION: This exam was performed according to the departmental dose-optimization program which includes automated exposure control, adjustment of the mA and/or kV according to patient size and/or use of iterative reconstruction technique. CONTRAST:  OMNIPAQUE IOHEXOL 350 MG/ML SOLN COMPARISON:  None Available. FINDINGS: CTA NECK FINDINGS Aortic arch: Standard 3 vessel aortic arch with mild atherosclerosis. Widely patent arch vessel origins. Right carotid system: Patent with a small amount calcified and soft plaque at the carotid bifurcation. No evidence of a significant stenosis or dissection. Left carotid system: Patent with a small amount of calcified and soft plaque at the carotid bifurcation. No evidence of a significant stenosis or dissection. Vertebral arteries: Patent and codominant. Limited assessment of the left vertebral origin due to artifact with an underlying stenosis not excluded. Skeleton: Advanced disc degeneration in the lower cervical spine.  Advanced facet arthrosis on the left at C3-4 and on the right at C4-5. Other neck: No evidence of cervical lymphadenopathy or mass. Prominent fatty atrophy of the right submandibular gland. Upper chest: Clear lung apices. Review of the MIP images confirms the above findings CTA HEAD FINDINGS Anterior circulation: The internal carotid arteries are patent from skull base to carotid termini with mild atherosclerosis bilaterally not resulting in significant stenosis. ACAs and MCAs are patent with distal branch vessel atherosclerotic irregularity but no evidence of a proximal branch occlusion or significant proximal stenosis. No aneurysm is identified. Posterior circulation: The intracranial vertebral arteries  are patent to the basilar with atherosclerosis resulting in moderate right and severe left V4 stenoses. The basilar artery is widely patent with an incidental fenestration noted proximally. Patent right PICA, left AICA, and bilateral SCA origins are visualized. There are moderate right and large left posterior communicating arteries with absence of the left P1 segment. The PCAs are patent with severe right and mild left proximal P2 stenoses. No aneurysm is identified. Venous sinuses: As permitted by contrast timing, patent. Anatomic variants: Fetal left PCA. Review of the MIP images confirms the above findings CT Brain Perfusion Findings: ASPECTS: 10 CBF (<30%) Volume: 0 mL Perfusion (Tmax>6.0s) volume: 0 mL Mismatch Volume: 0 mL Infarction Location: No acute infarct identified by CTP Preliminary emergent findings were discussed by telephone with Dr. Iver Nestle on 04/04/2023 at 3:36 p.m. IMPRESSION: 1. No large vessel occlusion. 2. Intracranial atherosclerosis including moderate right and severe left V4 stenoses and a severe right P2 stenosis. 3. Mild cervical carotid atherosclerosis without stenosis. 4. No evidence of an acute infarct or penumbra on CTP. 5.  Aortic Atherosclerosis (ICD10-I70.0). Electronically Signed    By: Sebastian Ache M.D.   On: 04/04/2023 15:50   CT HEAD CODE STROKE WO CONTRAST  Result Date: 04/04/2023 CLINICAL DATA:  Code stroke.  Neuro deficit, acute, stroke suspected EXAM: CT HEAD WITHOUT CONTRAST TECHNIQUE: Contiguous axial images were obtained from the base of the skull through the vertex without intravenous contrast. RADIATION DOSE REDUCTION: This exam was performed according to the departmental dose-optimization program which includes automated exposure control, adjustment of the mA and/or kV according to patient size and/or use of iterative reconstruction technique. COMPARISON:  03/15/22 Head CT FINDINGS: Brain: Hemorrhage. No hydrocephalus. No extra-axial fluid collection. No mass effect. No mass lesion. 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 No CT evidence of an acute cortical infarct Vascular: No hyperdense vessel or unexpected calcification. Skull: Normal. Negative for fracture or focal lesion. Sinuses/Orbits: No middle ear or mastoid effusion. Paranasal sinuses are notable for mucosal thickening in the right ethmoid sinus. Bilateral lens replacement. Orbits are otherwise unremarkable. Other: None. ASPECTS [facility] Stroke Program Early CT Score): 10 when accounting for chronic findings IMPRESSION: 1. No hemorrhage or CT evidence of an acute cortical infarct. 2. 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 Findings were paged to Dr. Iver Nestle on 04/04/2023 at 3:25 p.m. Electronically Signed   By: Lorenza Cambridge M.D.   On: 04/04/2023 15:25    Pending Labs Unresulted Labs (From admission, onward)     Start     Ordered   04/06/23 0500  Basic metabolic panel  Tomorrow morning,   R        04/05/23 1016   04/06/23 0500  Magnesium  Tomorrow morning,   R        04/05/23 1016   04/05/23 1126  RPR  Once,   R        04/05/23 1125             Vitals/Pain Today's Vitals   04/05/23 1100 04/05/23 1200 04/05/23 1242 04/05/23 1245  BP: 128/86 126/62    Pulse: 78 82 87 85  Resp: 18 19 19  (!) 21  Temp:      TempSrc:      SpO2: 100% 98% 98% 100%  Weight:      Height:      PainSc:  Isolation Precautions No active isolations  Medications Medications  aspirin tablet 325 mg (325 mg Oral Given 04/05/23 0826)  enoxaparin (LOVENOX) injection 40 mg (40 mg Subcutaneous Given 04/04/23 2341)  atorvastatin (LIPITOR) tablet 80 mg (80 mg Oral Given 04/05/23 0827)  insulin aspart (novoLOG) injection 0-9 Units (2 Units Subcutaneous Given 04/05/23 1248)  venlafaxine XR (EFFEXOR-XR) 24 hr capsule 37.5 mg (37.5 mg Oral Given 04/05/23 0827)  sodium chloride flush (NS) 0.9 % injection 3 mL (3 mLs Intravenous Given 04/04/23 1806)  iohexol (OMNIPAQUE) 350 MG/ML injection 100 mL (100 mLs Intravenous Contrast Given 04/04/23 1526)  influenza vaccine adjuvanted (FLUAD) injection 0.5 mL (0.5 mLs Intramuscular Given 04/05/23 1029)  potassium chloride 10 mEq in 100 mL IVPB (0 mEq Intravenous Stopped 04/05/23 0427)    Mobility walks with person assist     Focused Assessments Neuro Assessment Handoff:  Swallow screen pass? Yes  Cardiac Rhythm: Normal sinus rhythm NIH Stroke Scale  Dizziness Present: No Headache Present: No Interval: Shift assessment Level of Consciousness (1a.)   : Alert, keenly responsive LOC Questions (1b. )   : Answers neither question correctly LOC Commands (1c. )   : Performs both tasks correctly Best Gaze (2. )  : Normal Visual (3. )  : No visual loss Facial Palsy (4. )    : Minor paralysis Motor Arm, Left (5a. )   : No drift Motor Arm, Right (5b. ) : No drift Motor Leg, Left (6a. )  : No drift Motor Leg, Right (6b. ) : No drift Limb Ataxia (7. ): Absent Sensory (8. )  : Normal, no sensory loss Best Language (9. )  : No aphasia Dysarthria (10. ): Mild-to-moderate dysarthria, patient slurs at least some words and,  at worst, can be understood with some difficulty Extinction/Inattention (11.)   : No Abnormality Complete NIHSS TOTAL: 4 Last date known well: 04/03/23 Last time known well: 1340 Neuro Assessment: Exceptions to WDL Neuro Checks:   Initial (04/04/23 1530)  Has TPA been given? No If patient is a Neuro Trauma and patient is going to OR before floor call report to 4N Charge nurse: [phone] or [phone]   R Recommendations: See Admitting Provider Note  Report given to: 3W16

## 2023-04-05 NOTE — Progress Notes (Signed)
Echocardiogram 2D Echocardiogram has been performed.  Lucendia Herrlich 04/05/2023, 11:26 AM

## 2023-04-05 NOTE — Evaluation (Signed)
Clinical/Bedside Swallow Evaluation Patient Details  Name: Jacob [name] MRN: [id number] Date of Birth: 10/14/1955  Today's Date: 04/05/2023 Time: SLP Start Time (ACUTE ONLY): 2725 SLP Stop Time (ACUTE ONLY): 3664 SLP Time Calculation (min) (ACUTE ONLY): 11 min  Past Medical History:  Past Medical History:  Diagnosis Date   Anxiety    Arthritis    "hurt qwhere in my bones; especially early in the morning" (06/08/2016)   Chronic cough 08/10/2011   more than 6 yrs from sinus drainage   Colonic polyp 08/04/2011   08/2011: colonoscopy and biopsies:   Descending colon: tubular adenoma>>>no high grade dysplasia or malignancy   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 meds   Increased urinary frequency    Kidney stones 08/10/2011   once in past, none recent   Methylprednisolone-induced hypoxia 08/23/2011   Neuromuscular disorder (HCC) 08/10/2011   "burning/tingling sensation In feet"   Obesity    Obesity, Class II, BMI 35-39.9, with comorbidity 06/29/2006   OSA (obstructive sleep apnea)    "couldn't deal w/CPAP" (06/08/2016)   Preoperative clearance    Prostate cancer (HCC)    RUQ pain 06/09/2016   Shortness of breath 08/10/2011   with increased activity only   Syncope and collapse 06/09/2016   Syncope and collapse 06/09/2016   Type II  diabetes mellitus (HCC)    Umbilical hernia    Past Surgical History:  Past Surgical History:  Procedure Laterality Date   CATARACT EXTRACTION W/ INTRAOCULAR LENS  IMPLANT, BILATERAL Bilateral 08/10/2011 - ~ 2014   "right - left"   CHOLECYSTECTOMY N/A 06/10/2016   Procedure: LAPAROSCOPIC CHOLECYSTECTOMY WITH INTRAOPERATIVE CHOLANGIOGRAM;  Surgeon: Harriette Bouillon, MD;  Location: [facility];  Service: General;  Laterality: N/A;   COLONOSCOPY W/ BIOPSIES AND POLYPECTOMY  2013   HERNIA REPAIR     LIPOMA EXCISION Right 10/18/2018   Procedure: EXCISION RIGHT BACK  LIPOMA X 2;  Surgeon: Harriette Bouillon, MD;  Location: [facility];  Service: General;  Laterality: Right;   LYMPHADENECTOMY Bilateral 02/14/2022   Procedure: LYMPHADENECTOMY, PELVIC;  Surgeon: Heloise Purpura, MD;  Location: [facility];  Service: Urology;  Laterality: Bilateral;   PROSTATE BIOPSY     ROBOT ASSISTED LAPAROSCOPIC RADICAL PROSTATECTOMY N/A 02/14/2022   Procedure: XI ROBOTIC ASSISTED LAPAROSCOPIC RADICAL PROSTATECTOMY LEVEL 2;  Surgeon: Heloise Purpura, MD;  Location: [facility];  Service: Urology;  Laterality: N/A;   VENTRAL HERNIA REPAIR  08/22/2011   Procedure: HERNIA REPAIR VENTRAL ADULT;  Surgeon: Ernestene Mention, MD;  Location: [facility];  Service: General;  Laterality: N/A;  incarcerated   HPI:  Jacob [name] is a 67yo M who presented via EMS s/p syncope at outpatient urology office. On arrival noted to have rightward gaze with nystagmus and pinpoint pupils and right-sided weakness not responding to Narcan in  ED. Per sister's report has had gradual decline and slurred speech yesterday. MRI 12/3: showing acute infarcts in L basal ganglia/corona radiata, posterior temporal cortex, and posterior L pons and no prior h/o seizures. Pt with hx early dementia, HTN, T2DM, HLD, h/o prostate cancer s/p prostatectomy and currently undergoing radiation therapy, gout, obesity, OSA, hepatic steatosis, ED, MDD.    Assessment / Plan /  Recommendation  Clinical Impression  Pt presents with functional swallowing as assessed clinically.  Pt tolerated all consistencies trialed with no clinical s/s of aspiration, including serial straw sips of thin liquid.  Pt exhibited adequate oral clearance of regular solids textures.  There was delayed dry coughing episode x1 following one pill administration. Pt took three pills one at a time with thin liquid.  Pt did not exhibit any other difficulties.  He has no further ST needs for swallowing at this time.    Recommend continuing regular texture diet with thin liquids.   SLP Visit Diagnosis: Dysphagia, unspecified (R13.10)    Aspiration Risk  Mild aspiration risk    Diet Recommendation Regular;Thin liquid    Liquid Administration via: Cup;Straw Medication Administration: Whole meds with liquid Supervision: Patient able to self feed Compensations: Small sips/bites;Slow rate Postural Changes: Seated upright at 90 degrees    Other  Recommendations Oral Care Recommendations: Oral care BID    Recommendations for follow up therapy are one component of a multi-disciplinary discharge planning process, led by the attending physician.  Recommendations may be updated based on patient status, additional functional criteria and insurance authorization.  Follow up Recommendations No SLP follow up      Assistance Recommended at Discharge  N/A  Functional Status Assessment Patient has not had a recent decline in their functional status  Frequency and Duration  (N/A)          Prognosis Prognosis for improved oropharyngeal function:  (N/A)      Swallow Study   General Date of Onset: 04/04/23 HPI: Jacob [name] is a 67yo M who presented via EMS s/p syncope at outpatient urology office. On arrival noted to have rightward gaze with nystagmus and pinpoint pupils and right-sided weakness not responding to Narcan in ED. Per sister's report has had gradual decline and slurred speech yesterday.  MRI 12/3: showing acute infarcts in L basal ganglia/corona radiata, posterior temporal cortex, and posterior L pons and no prior h/o seizures. Pt with hx early dementia, HTN, T2DM, HLD, h/o prostate cancer s/p prostatectomy and currently undergoing radiation therapy, gout, obesity, OSA, hepatic steatosis, ED, MDD. Type of Study: Bedside Swallow Evaluation Previous Swallow Assessment: None Diet Prior to this Study: Regular;Thin liquids (Level 0) Temperature Spikes Noted: No History of Recent Intubation: No Behavior/Cognition: Alert;Cooperative;Pleasant mood Oral Cavity Assessment: Within Functional Limits Oral Care Completed by SLP: No Oral Cavity - Dentition: Adequate natural dentition Vision: Functional for self-feeding Self-Feeding Abilities: Able to feed self Patient Positioning: Upright in bed Baseline Vocal Quality: Normal Volitional Cough: Strong Volitional Swallow: Able to elicit    Oral/Motor/Sensory Function Overall Oral Motor/Sensory Function: Within functional limits Facial ROM: Within Functional Limits Facial Symmetry: Within Functional Limits Lingual ROM: Within Functional Limits Lingual Symmetry: Within Functional Limits Lingual Strength: Within Functional Limits Velum: Within Functional Limits Mandible: Within Functional Limits   Ice Chips Ice chips: Not tested   Thin Liquid Thin Liquid: Within functional limits Presentation: Straw    Nectar Thick Nectar Thick Liquid: Not tested   Honey Thick Honey Thick Liquid: Not tested   Puree Puree: Within functional  limits Presentation: Spoon   Solid     Solid: Within functional limits Presentation: Self Fed      Kerrie Pleasure, MA, CCC-SLP Acute Rehabilitation Services Office: [phone] 04/05/2023,9:09 AM

## 2023-04-05 NOTE — Evaluation (Signed)
Physical Therapy Evaluation Patient Details Name: Jacob [name] MRN: [id number] DOB: May 31, 1955 Today's Date: 04/05/2023  History of Present Illness  67 y.o. male presenting with slurred speech and right sided weakness. 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 PMH of HLD, HTN, anxiety, DM II, gout, hx of prostate cancer.  Clinical Impression  Pt presents with admitting diagnosis above. Co-treat with OT. Pt today was able to ambulate in hallway with CGA/Min A with no AD however pt was noted to have some R inattention when navigating obstacles and mild unsteadiness. Pt also appeared to be slightly confused during session today. PTA pt reports that he was fully independent with no AD living by himself in an apartment. Given that pt lives alone with limited support, current cognitive deficits, and unsteady gait, patient will benefit from continued inpatient follow up therapy, <3 hours/day. PT will continue to follow.       If plan is discharge home, recommend the following: A little help with walking and/or transfers;A little help with bathing/dressing/bathroom;Supervision due to cognitive status;Assistance with cooking/housework;Direct supervision/assist for medications management;Assist for transportation;Help with stairs or ramp for entrance   Can travel by private vehicle   Yes    Equipment Recommendations Other (comment) (Per accepting facility)  Recommendations for Other Services       Functional Status Assessment Patient has had a recent decline in their functional status and demonstrates the ability to make significant improvements in function in a reasonable and predictable amount of time.     Precautions / Restrictions Precautions Precautions: Fall Restrictions Weight Bearing Restrictions: No      Mobility  Bed Mobility Overal bed mobility: Needs Assistance Bed Mobility: Supine to Sit, Sit  to Supine     Supine to sit: Supervision Sit to supine: Supervision        Transfers Overall transfer level: Needs assistance Equipment used: None Transfers: Sit to/from Stand Sit to Stand: Contact guard assist                Ambulation/Gait Ambulation/Gait assistance: Contact guard assist, Min assist Gait Distance (Feet): 75 Feet Assistive device: None Gait Pattern/deviations: Drifts right/left, Staggering right, Decreased stride length, Step-through pattern Gait velocity: decreased     General Gait Details: Pt noted to have R inattention when navigating obstacles. Tended to drift to the R and was slightly unsteady however no overt LOB noted. Min A when turning for balance.  Stairs            Wheelchair Mobility     Tilt Bed    Modified Rankin (Stroke Patients Only)       Balance Overall balance assessment: Needs assistance Sitting-balance support: Feet supported, Bilateral upper extremity supported Sitting balance-Leahy Scale: Good       Standing balance-Leahy Scale: Poor Standing balance comment: Min A to maintain dynamic balance during gait                             Pertinent Vitals/Pain Pain Assessment Pain Assessment: No/denies pain Faces Pain Scale: No hurt    Home Living Family/patient expects to be discharged to:: Private residence Living Arrangements: Alone Available Help at Discharge: Family;Available PRN/intermittently Type of Home: Apartment Home Access: Level entry       Home Layout: One level Home Equipment: Shower seat - built in;Hand held shower head;Other (comment) (walking stick)      Prior Function  Prior Level of Function : Independent/Modified Independent;Driving             Mobility Comments: Pt reports fully independent ADLs Comments: Ind     Extremity/Trunk Assessment   Upper Extremity Assessment Upper Extremity Assessment: Overall WFL for tasks assessed    Lower Extremity  Assessment Lower Extremity Assessment: Overall WFL for tasks assessed    Cervical / Trunk Assessment Cervical / Trunk Assessment: Normal  Communication   Communication Communication: No apparent difficulties Cueing Techniques: Verbal cues;Gestural cues  Cognition Arousal: Alert Behavior During Therapy: WFL for tasks assessed/performed Overall Cognitive Status: No family/caregiver present to determine baseline cognitive functioning                                 General Comments: Possible hx of vascular dementia? disoriented to time and situation. Pt demonstrating slow processing and signs of apraxia. During session pt noted to be placing shoes on wrong feet, confusing oatmeal with mashed potates. Impaired STM        General Comments General comments (skin integrity, edema, etc.): Monitor not on upon entry, notified RN that pt needs new leads. Pt also denies being hot but seemed to want his gown off during session    Exercises     Assessment/Plan    PT Assessment Patient needs continued PT services  PT Problem List Decreased strength;Decreased range of motion;Decreased activity tolerance;Decreased mobility;Decreased balance;Decreased coordination;Decreased knowledge of use of DME;Decreased cognition;Decreased safety awareness;Decreased knowledge of precautions;Cardiopulmonary status limiting activity       PT Treatment Interventions DME instruction;Gait training;Stair training;Functional mobility training;Therapeutic activities;Therapeutic exercise;Balance training;Neuromuscular re-education;Cognitive remediation;Patient/family education    PT Goals (Current goals can be found in the Care Plan section)  Acute Rehab PT Goals Patient Stated Goal: to go home PT Goal Formulation: With patient Time For Goal Achievement: 04/19/23 Potential to Achieve Goals: Good    Frequency Min 1X/week     Co-evaluation PT/OT/SLP Co-Evaluation/Treatment: Yes Reason for  Co-Treatment: Complexity of the patient's impairments (multi-system involvement) PT goals addressed during session: Mobility/safety with mobility;Balance OT goals addressed during session: ADL's and self-care       AM-PAC PT "6 Clicks" Mobility  Outcome Measure Help needed turning from your back to your side while in a flat bed without using bedrails?: A Little Help needed moving from lying on your back to sitting on the side of a flat bed without using bedrails?: A Little Help needed moving to and from a bed to a chair (including a wheelchair)?: A Little Help needed standing up from a chair using your arms (e.g., wheelchair or bedside chair)?: A Little Help needed to walk in hospital room?: A Little Help needed climbing 3-5 steps with a railing? : A Lot 6 Click Score: 17    End of Session Equipment Utilized During Treatment: Gait belt Activity Tolerance: Patient tolerated treatment well Patient left: in bed;with call bell/phone within reach Nurse Communication: Mobility status PT Visit Diagnosis: Other abnormalities of gait and mobility (R26.89)    Time: 8295-6213 PT Time Calculation (min) (ACUTE ONLY): 24 min   Charges:   PT Evaluation $PT Eval Moderate Complexity: 1 Mod   PT General Charges $$ ACUTE PT VISIT: 1 Visit         Shela Nevin, PT, DPT Acute Rehab Services [phone]   Gladys Damme 04/05/2023, 1:51 PM

## 2023-04-05 NOTE — Hospital Course (Addendum)
Jacob [name] is a 67 y.o.male with a history of prostate cancer, HLD, HTN, OSA, T2DM, GERD, gout, anxiety and depression who was admitted to the [facility] at [facility] for neurological deficits. His hospital course is detailed below:  Cerebrovascular attack Resolved on admission, MR showed acute infarcts in the left basal ganglia/corona radiata, left posterior temporal cortex, and posterior left pons. 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 Neuro recommended Plavix and aspirin x 3 weeks and then aspirin 81 mg alone.  Echo normal, but recommended 30-day loop recorder to evaluate for underlying A-fib.  Dysuria UA negative, voiding spontaneously throughout admission with appropriate bladder scans.   Cognitive decline Per patients sister, patient showing signs of cognitive decline. Discussed this at length with sister and patient who are agreeable to SNF. Patient does have underlying history of depression - which may be contributing factor. Patient was continued on home regimen of venlafaxine XR 37.5 mg.   Other chronic conditions were medically managed with home medications and formulary alternatives as necessary (***)  PCP Follow-up Recommendations: Reversible causes workup for cognitive decline 30 Day Loop recorder needed per neurology rec- please order  Evaluate for elderly depression, suspect significant undiagnosed depression component

## 2023-04-05 NOTE — Assessment & Plan Note (Addendum)
MRI brain showing acute infarction left basal ganglia/corona radiata, left posterior temporal cortex, posterior left pons without hemorrhage.  No focal neurological deficit, no post CVA deficits identified on exam.  Continuing post stroke optimization as below. -Imaging: Carotid Dopplers, TTE pending -c/s SLP, PT, OT, follow-up recs -Cardiac monitoring 24-48 hours -A1c 6.7, LDL 106; optimize risk factors as below -Neuro stroke team following, follow up recs:  -Permissive HTN up to 220/120 x 24-48 hours, hold home amlodipine for now  -Follow up clopidogrel/AC recs

## 2023-04-05 NOTE — Evaluation (Signed)
Occupational Therapy Evaluation Patient Details Name: Jacob [name] MRN: [id number] DOB: [birth year] Today's Date: 04/05/2023   History of Present Illness 67 y.o. male presenting with slurred speech and right sided weakness. 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 PMH of HLD, HTN, anxiety, DM II, gout, hx of prostate cancer.   Clinical Impression   Pt admitted for above, on IE he displayed some mild R inattention with his vision being [facility]. Pt seemingly displaying signs of apraxia, he is unsteady and requires min A to ambulate no AD, able to complete seated ADLs with setup/supervision. Due to pt inconsistent cognition he would benefit from post acute skilled rehab with <3hrs of intensive therapy. OT will continue to follow pt acutely to address deficits and help transition to next level of care.        If plan is discharge home, recommend the following: Supervision due to cognitive status;Direct supervision/assist for financial management;Direct supervision/assist for medications management;A little help with walking and/or transfers    Functional Status Assessment  Patient has had a recent decline in their functional status and demonstrates the ability to make significant improvements in function in a reasonable and predictable amount of time.  Equipment Recommendations  Other (comment) (Defer to next level of care)    Recommendations for Other Services       Precautions / Restrictions Precautions Precautions: Fall Restrictions Weight Bearing Restrictions: No      Mobility Bed Mobility Overal bed mobility: Needs Assistance Bed Mobility: Supine to Sit, Sit to Supine     Supine to sit: Supervision Sit to supine: Supervision        Transfers Overall transfer level: Needs assistance Equipment used: None Transfers: Sit to/from Stand Sit to Stand: Contact guard assist                   Balance Overall balance assessment: Needs assistance Sitting-balance support: Feet supported, Bilateral upper extremity supported Sitting balance-Leahy Scale: Good       Standing balance-Leahy Scale: Poor Standing balance comment: Min A to maintain dynamic balance during gait                           ADL either performed or assessed with clinical judgement   ADL Overall ADL's : Needs assistance/impaired Eating/Feeding: Independent;Sitting   Grooming: Sitting;Set up   Upper Body Bathing: Sitting;Set up   Lower Body Bathing: Set up;Sitting/lateral leans   Upper Body Dressing : Sitting;Set up   Lower Body Dressing: Sitting/lateral leans;Set up;Supervision/safety Lower Body Dressing Details (indicate cue type and reason): don shoes Toilet Transfer: Minimal assistance;Ambulation   Toileting- Clothing Manipulation and Hygiene: Minimal assistance;Sit to/from stand       Functional mobility during ADLs: Minimal assistance General ADL Comments: hall ambulation No AD.     Vision Baseline Vision/History: 0 No visual deficits Patient Visual Report: No change from baseline Vision Assessment?: Yes Eye Alignment: Within Functional Limits Ocular Range of Motion: Within Functional Limits Alignment/Gaze Preference: Within Defined Limits Tracking/Visual Pursuits: Able to track stimulus in all quads without difficulty Saccades:  (NT) Visual Fields: No apparent deficits Diplopia Assessment:  (none reported) Additional Comments: Some mild R inattention     Perception         Praxis Praxis: Impaired   Praxis-Other Comments: possible limb apraxia   Pertinent Vitals/Pain Pain Assessment Pain Assessment: No/denies pain Faces Pain Scale: No hurt  Extremity/Trunk Assessment Upper Extremity Assessment Upper Extremity Assessment: Overall WFL for tasks assessed   Lower Extremity Assessment Lower Extremity Assessment: Defer to PT evaluation        Communication Communication Communication: No apparent difficulties Cueing Techniques: Verbal cues;Gestural cues   Cognition Arousal: Alert Behavior During Therapy: WFL for tasks assessed/performed Overall Cognitive Status: No family/caregiver present to determine baseline cognitive functioning                                 General Comments: Possible hx of vascular dementia? disoriented to time and situation. Pt demonstrating slow processing and signs of apraxia. During session pt noted to be placing shoes on wrong feet, confusing oatmeal with mashed potates. Impaired STM     General Comments  Monitor not on upon entry, notified RN that pt needs new leads. Pt also denies being hot but seemed to want his gown off during session    Exercises     Shoulder Instructions      Home Living Family/patient expects to be discharged to:: Private residence Living Arrangements: Alone Available Help at Discharge: Family;Available PRN/intermittently Type of Home: Apartment Home Access: Level entry     Home Layout: One level     Bathroom Shower/Tub: Producer, television/film/video: Standard Bathroom Accessibility: Yes   Home Equipment: Shower seat - built in;Hand held shower head;Other (comment) (walking stick)          Prior Functioning/Environment Prior Level of Function : Independent/Modified Independent;Driving             Mobility Comments: Pt reports fully independent ADLs Comments: Ind        OT Problem List: Impaired balance (sitting and/or standing);Decreased safety awareness;Decreased cognition      OT Treatment/Interventions: Self-care/ADL training;Therapeutic exercise;Therapeutic activities;Patient/family education;Balance training    OT Goals(Current goals can be found in the care plan section) Acute Rehab OT Goals Patient Stated Goal: To get home OT Goal Formulation: With patient Time For Goal Achievement: 04/19/23 Potential to Achieve  Goals: Good ADL Goals Pt Will Perform Grooming: with modified independence;standing Pt Will Perform Lower Body Dressing: sit to/from stand;with supervision Pt Will Transfer to Toilet: ambulating;with supervision Additional ADL Goal #1: Pt will properly sequence all bADL tasks without cueing Additional ADL Goal #2: Pt will independently complete 3 step iADL task without cueing  OT Frequency: Min 1X/week    Co-evaluation PT/OT/SLP Co-Evaluation/Treatment: Yes Reason for Co-Treatment: Complexity of the patient's impairments (multi-system involvement) PT goals addressed during session: Mobility/safety with mobility;Balance OT goals addressed during session: ADL's and self-care      AM-PAC OT "6 Clicks" Daily Activity     Outcome Measure Help from another person eating meals?: None Help from another person taking care of personal grooming?: A Little Help from another person toileting, which includes using toliet, bedpan, or urinal?: A Little Help from another person bathing (including washing, rinsing, drying)?: A Little Help from another person to put on and taking off regular upper body clothing?: A Little Help from another person to put on and taking off regular lower body clothing?: A Little 6 Click Score: 19   End of Session Equipment Utilized During Treatment: Gait belt Nurse Communication: Mobility status  Activity Tolerance: Patient tolerated treatment well Patient left: in bed;with call bell/phone within reach  OT Visit Diagnosis: Unsteadiness on feet (R26.81);Other symptoms and signs involving the nervous system (R29.898);Other symptoms and signs involving cognitive function  Time: 1610-9604 OT Time Calculation (min): 25 min Charges:  OT General Charges $OT Visit: 1 Visit OT Evaluation $OT Eval Moderate Complexity: 1 Mod  04/05/2023  AB, OTR/L  Acute Rehabilitation Services  Office: [phone]   Tristan Schroeder 04/05/2023, 1:32 PM

## 2023-04-05 NOTE — Assessment & Plan Note (Signed)
LDL 106 this admission, goal <70. -Start atorvastatin 80 mg for high intensity therapy

## 2023-04-06 ENCOUNTER — Ambulatory Visit: Payer: Self-pay | Admitting: Licensed Clinical Social Worker

## 2023-04-06 ENCOUNTER — Encounter (HOSPITAL_COMMUNITY): Payer: 59

## 2023-04-06 ENCOUNTER — Ambulatory Visit: Payer: 59

## 2023-04-06 DIAGNOSIS — R300 Dysuria: Secondary | ICD-10-CM | POA: Insufficient documentation

## 2023-04-06 DIAGNOSIS — E119 Type 2 diabetes mellitus without complications: Secondary | ICD-10-CM

## 2023-04-06 DIAGNOSIS — F0390 Unspecified dementia without behavioral disturbance: Secondary | ICD-10-CM | POA: Insufficient documentation

## 2023-04-06 DIAGNOSIS — I639 Cerebral infarction, unspecified: Secondary | ICD-10-CM | POA: Diagnosis not present

## 2023-04-06 LAB — RPR: RPR Ser Ql: NONREACTIVE

## 2023-04-06 LAB — BASIC METABOLIC PANEL
Anion gap: 10 (ref 5–15)
BUN: 17 mg/dL (ref 8–23)
CO2: 26 mmol/L (ref 22–32)
Calcium: 8.7 mg/dL — ABNORMAL LOW (ref 8.9–10.3)
Chloride: 101 mmol/L (ref 98–111)
Creatinine, Ser: 1.04 mg/dL (ref 0.61–1.24)
GFR, Estimated: >60 mL/min (ref >60)
Glucose, Bld: 175 mg/dL — ABNORMAL HIGH (ref 70–99)
Potassium: 3.9 mmol/L (ref 3.5–5.1)
Sodium: 137 mmol/L (ref 135–145)

## 2023-04-06 LAB — GLUCOSE, CAPILLARY
Glucose-Capillary: 132 mg/dL — ABNORMAL HIGH (ref 70–99)
Glucose-Capillary: 173 mg/dL — ABNORMAL HIGH (ref 70–99)
Glucose-Capillary: 153 mg/dL — ABNORMAL HIGH (ref 70–99)
Glucose-Capillary: 145 mg/dL — ABNORMAL HIGH (ref 70–99)

## 2023-04-06 LAB — MAGNESIUM: Magnesium: 1.6 mg/dL — ABNORMAL LOW (ref 1.7–2.4)

## 2023-04-06 MED ORDER — MAGNESIUM GLUCONATE 500 MG PO TABS
500.0000 mg | ORAL_TABLET | Freq: Once | ORAL | Status: AC
  Administered 2023-04-06: 500 mg via ORAL
  Filled 2023-04-06: qty 1

## 2023-04-06 MED ORDER — MAGNESIUM SULFATE IN D5W 1-5 GM/100ML-% IV SOLN
1.0000 g | Freq: Once | INTRAVENOUS | Status: DC
  Filled 2023-04-06: qty 100

## 2023-04-06 NOTE — Assessment & Plan Note (Signed)
Chart review shows PCP Dr. Rexene Alberts noted on 10/28 that sisters think about nursing home placement, patient is reluctant because of 3 cats at home, there was discussion of starting paperwork for HPOA.  Noted the patient has a history of severe untreated depression, could be contributing to baseline confusion. -TOC consult for placement -Contact sister for collateral

## 2023-04-06 NOTE — Progress Notes (Signed)
STROKE TEAM PROGRESS NOTE   BRIEF HPI [name] is a 67 y.o. male with history of Jacob [name] and depression, GERD, headaches, gout, hyperlipidemia, hypertension, diabetes, obstructive sleep apnea, prostate cancer, obesity, neuromuscular disorder presenting syncopal witnessed event at his outpatient urology appointment.  And EMS arrived he was noted to have a rightward gaze with nystagmus as well as right-sided weakness and was aphasic he was given Narcan with no response.  NIH on Admission 13   SIGNIFICANT HOSPITAL EVENTS 12/3 admitted.  CT with no acute process.  CTA with no LVO.  MRI brain with acute infarcts in left basal ganglia and corona radiata, left posterior temporal cortex and posterior left pons  INTERIM HISTORY/SUBJECTIVE  No family at the bedside.  Patient is laying in the bed in no apparent distress.  [name] better today and less confused.  No new issues.  Vital signs stable.  Echocardiogram was unremarkable.  OBJECTIVE  CBC    Component Value Date/Time   WBC 9.2 04/04/2023 1516   RBC 4.43 04/04/2023 1516   HGB 12.6 (L) 04/04/2023 1520   HGB 13.4 02/27/2023 1717   HCT 37.0 (L) 04/04/2023 1520   HCT 43.0 02/27/2023 1717   PLT 158 04/04/2023 1516   PLT 186 02/27/2023 1717   MCV 82.2 04/04/2023 1516   MCV 89 02/27/2023 1717   MCH 28.0 04/04/2023 1516   MCHC 34.1 04/04/2023 1516   RDW 13.2 04/04/2023 1516   RDW 13.3 02/27/2023 1717   LYMPHSABS 1.6 04/04/2023 1516   LYMPHSABS 1.2 02/27/2023 1717   MONOABS 0.5 04/04/2023 1516   EOSABS 0.1 04/04/2023 1516   EOSABS 0.2 02/27/2023 1717   BASOSABS 0.0 04/04/2023 1516   BASOSABS 0.0 02/27/2023 1717    BMET    Component Value Date/Time   NA 137 04/06/2023 0620   NA 143 02/27/2023 1717   K 3.9 04/06/2023 0620   CL 101 04/06/2023 0620   CO2 26 04/06/2023 0620   GLUCOSE 175 (H) 04/06/2023 0620   BUN 17 04/06/2023 0620   BUN 12 02/27/2023 1717   CREATININE 1.04 04/06/2023 0620   CREATININE 0.83 04/08/2015 1026    CALCIUM 8.7 (L) 04/06/2023 0620   EGFR 89 02/27/2023 1717   GFRNONAA >60 04/06/2023 0620   GFRNONAA >89 04/08/2015 1026    IMAGING past 24 hours No results found.  Vitals:   04/05/23 2358 04/06/23 0405 04/06/23 0737 04/06/23 1146  BP: (!) 145/69 (!) 142/83 (!) 152/80 (!) 151/97  Pulse: 78 75 77 86  Resp:  18 18 17   Temp: 98.4 F (36.9 C) 98.8 F (37.1 C) 98.2 F (36.8 C) 97.9 F (36.6 C)  TempSrc: Oral Oral Oral Oral  SpO2: 97% 96% 96% 99%  Weight:      Height:         PHYSICAL EXAM General:  Alert, disheveled elderly man Psych:  Mood and affect appropriate for situation CV: Regular rate and rhythm on monitor Respiratory:  Regular, unlabored respirations on room air GI: Abdomen soft and nontender   NEURO:  Mental Status: [name] confused he is oriented to himself unable to state the correct month or year president or situation.  Speech is clear Cranial Nerves:  II: PERRL. Visual fields full.  III, IV, VI: EOMI. Eyelids elevate symmetrically.  V: Sensation is intact to light touch and symmetrical to face.  VII: Subtle right facial droop VIII: hearing intact to voice. IX, X: Palate elevates symmetrically. Phonation is normal.  GE:[id number] shrug 5/5.  XII: tongue is midline without fasciculations. Motor: 5/5 strength to all muscle groups tested.  Tone: is normal and bulk is normal Sensation- Intact to light touch bilaterally. Extinction absent to light touch to DSS.   Coordination: FTN intact bilaterally, HKS: no ataxia in BLE.No drift.  Gait- deferred     ASSESSMENT/PLAN  Acute Ischemic Infarct:  left ganglia and corona radiata, left posterior temporal cortex and left pons  Etiology: Normal vessel disease Code Stroke CT head No acute abnormality. Small vessel disease. ASPECTS 10.  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 intracranial atherosclerosis including moderate right and severe left V4 stenoses and a severe right P2  stenosis. CT perfusion no acute infarction or penumbra MRI acute ischemic infarcts in left basal ganglia and corona radiata, left posterior temporal cortex and left pons EEG This study is within normal limits. No seizures or epileptiform discharges were seen throughout the recording.  Carotid Doppler ordered 2D Echo EF 50 to 55%. Recommend 30 Day heart monitor or loop recorder LDL 106 HgbA1c 6.7 VTE prophylaxis -Lovenox No antithrombotic prior to admission, now on aspirin 81 mg daily and clopidogrel 75 mg daily for 3 weeks and then aspirin alone. 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 Yes   Advance diet as tolerated  Other Stroke Risk Factors Obstructive sleep apnea, not on CPAP at home   Other Active Problems Anxiety and depression  Hospital day # 1      Patient   was brought in by a syncopal episode and MRI shows multiple acute infarcts in left basal ganglia, corona radiator, left posterior temporal cortex and posterior left pons.  He has no memory of these events and what happened yesterday.  He denies prior history of atrial fibrillation significant cardiac or neurological events.   Jacob [name] out of bed.  Therapy consults.  Aspirin and Plavix for 3 weeks followed by aspirin alone and aggressive risk factor modification.   Recommend 30-day external heart monitor at discharge to look for paroxysmal A-fib.  No family at the bedside.  Greater than 50% time during this 35-minute visit was spent in counseling and coordination of care about his  multiple strokes and discussion about evaluation, prevention and treatment and answering questions.  Stroke team will sign off.  Follow-up as an outpatient stroke clinic in 2 months.    Jacob Heady, MD Medical Director [facility] Stroke [facility] Pager: [phone] 04/06/2023 2:14 PM   To contact Stroke Continuity provider, please refer to [email or web address]. After hours, contact General Neurology

## 2023-04-06 NOTE — Assessment & Plan Note (Signed)
Does have prostate cancer and did receive salvage radiation treatment directed to the prostatic fossa and pelvic lymph nodes and combination of ADT starting in September.  No evidence of retention on bladder scans, UA negative for UTI. -Strict I's and O's

## 2023-04-06 NOTE — Assessment & Plan Note (Signed)
A1c 6.7 this admission, goal <7.  Not on any medications outpatient. -Sensitive SSI -CBGs ACHS

## 2023-04-06 NOTE — TOC Initial Note (Signed)
Transition of Care [facility]) - Initial/Assessment Note    Patient Details  Name: Jacob [name] MRN: [id number] Date of Birth: Oct 08, 1955  Transition of Care [facility]) CM/SW Contact:    Baldemar Lenis, LCSW Phone Number: 04/06/2023, 3:27 PM  Clinical Narrative:    Patient from home alone with intermittent help from family, but family is requesting placement for patient. CSW spoke with sister, [name], and answered questions about Medicaid application. She will reach out to [facility] about [facility] application. Recommendation is for SNF, but patient initially refusing and MD indicated that patient had capacity. However, Mds indicated that they wanted to further evaluate capacity and are unsure; will follow up tomorrow. Sister has been discussing with the patient to get him agreeable to SNF at discharge. CSW faxed out referral, will provide bed offers to either patient or sister after further discussion on patient capacity.             Expected Discharge Plan: Skilled Nursing Facility Barriers to Discharge: Continued Medical Work up, English as a second language teacher   Patient Goals and CMS Choice Patient states their goals for this hospitalization and ongoing recovery are:: patient unable to participate in goal setting, not fully oriented CMS Medicare.gov Compare Post Acute Care list provided to:: Patient Represenative (must comment) Choice offered to / list presented to : Sibling Quinn ownership interest in [facility].provided to:: Sibling    Expected Discharge Plan and Services     Post Acute Care Choice: NA Living arrangements for the past 2 months: Apartment                                      Prior Living Arrangements/Services Living arrangements for the past 2 months: Apartment Lives with:: Self Patient language and need for interpreter reviewed:: No Do you feel safe going back to the place where you live?: Yes      Need for Family Participation in Patient  Care: Yes (Comment) Care giver support system in place?: No (comment)   Criminal Activity/Legal Involvement Pertinent to Current Situation/Hospitalization: No - Comment as needed  Activities of Daily Living   ADL Screening (condition at time of admission) Independently performs ADLs?: Yes (appropriate for developmental age) Is the patient deaf or have difficulty hearing?: No Does the patient have difficulty seeing, even when wearing glasses/contacts?: No  Permission Sought/Granted Permission sought to share information with : Facility Medical sales representative, Family Supports Permission granted to share information with : Yes, Verbal Permission Granted  Share Information with NAME: [name]  Permission granted to share info w AGENCY: SNF  Permission granted to share info w Relationship: Sister     Emotional Assessment Appearance:: Appears stated age Attitude/Demeanor/Rapport: Unable to Assess Affect (typically observed): Unable to Assess Orientation: : Oriented to Self, Oriented to Place Alcohol / Substance Use: Not Applicable Psych Involvement: No (comment)  Admission diagnosis:  Mixed hyperlipidemia [E78.2] Aphasia [R47.01] Dysuria [R30.0] TIA (transient ischemic attack) [G45.9] Hypokalemia [E87.6] CVA (cerebral vascular accident) (HCC) [I63.9] Acute cerebral infarction (HCC) [I63.9] Right sided weakness [R53.1] Type 2 diabetes mellitus with diabetic neuropathy, without long-term current use of insulin (HCC) [E11.40] Dementia, unspecified dementia severity, unspecified dementia type, unspecified whether behavioral, psychotic, or mood disturbance or anxiety (HCC) [F03.90] Patient Active Problem List   Diagnosis Date Noted   Dysuria 04/06/2023   Dementia (HCC) 04/06/2023   T2DM (type 2 diabetes mellitus) (HCC) 04/06/2023   CVA (cerebral vascular  accident) (HCC) 04/05/2023   Acute cerebral infarction (HCC) 04/04/2023   Prostate cancer (HCC) 02/14/2022   Urinary retention  07/28/2021   BPH (benign prostatic hyperplasia) 03/17/2021   Amber-colored urine 11/15/2019   Housing problems 03/21/2019   Severe recurrent major depression without psychotic features (HCC) 10/04/2018   Depression, recurrent (HCC) 09/17/2018   Urinary frequency 02/09/2018   Syncope and collapse 06/09/2016   Hepatic steatosis 06/09/2016   Biliary colic    Uncontrolled type 2 diabetes mellitus with diabetic neuropathy    ACS (acute coronary syndrome) (HCC) 06/08/2016   Erectile dysfunction 08/23/2013   Ventral hernia with obstruction 09/09/2011   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   OSA (obstructive sleep apnea) 08/23/2011   Colonic polyp 08/04/2011   Cataract 12/01/2010   Chest pain 07/15/2010   Type II diabetes mellitus with neurological manifestations, uncontrolled 10/03/2008   HYPERLIPIDEMIA 02/19/2007   Gout, unspecified 06/29/2006   Obesity, Class II, BMI 35-39.9, with comorbidity 06/29/2006   MDD (major depressive disorder), recurrent episode, mild (HCC) 06/29/2006   Anxiety state 06/29/2006   Essential hypertension, benign 06/29/2006   PCP:  [name], DO Pharmacy:   [facility] [address] [place] [address] Phone: [phone] Fax: [phone]  [facility] [address] [place], [address] [place] [address] Phone: [phone] Fax: [phone]  [id number] - [place], [place] - [address] AT [facility] [address] [place] [address] Phone: [phone] Fax: [phone]  [facility] [address] [place] [address] Phone: [phone] Fax: [phone]     Social Determinants of Health (SDOH) Social History: SDOH Screenings   Food Insecurity: No Food Insecurity (04/04/2023)  Housing: Low Risk  (04/04/2023)  Transportation Needs: No Transportation Needs (04/04/2023)  Utilities: Not At Risk (04/04/2023)   Alcohol Screen: Low Risk  (10/04/2018)  Depression (PHQ2-9): High Risk (02/27/2023)  Financial Resource Strain: High Risk (11/16/2022)  Stress: Stress Concern Present (04/16/2019)  Tobacco Use: Low Risk  (04/04/2023)   SDOH Interventions:     Readmission Risk Interventions     No data to display

## 2023-04-06 NOTE — Plan of Care (Signed)
Spoke to sister on phone about patient SNF needs. She agrees that he needs to be taken care of at a SNF. She spoke to Mr. Hailu, and he became more agreeable to the idea. TOC involved for SNF placement. Will discuss again with him tomorrow, answered all questions.   [employer] Geophysical data processor

## 2023-04-06 NOTE — Plan of Care (Signed)

## 2023-04-06 NOTE — Assessment & Plan Note (Signed)
MRI brain showing acute infarction left basal ganglia/corona radiata, left posterior temporal cortex, posterior left pons without hemorrhage.  No focal neurological deficit, no post CVA deficits identified on exam.  Echo unremarkable, less concerning for cardioembolic etiology but will need loop recorder outpatient. -PT/OT eval and treat, recommending SNF -Cardiac monitoring 48 hours -A1c 6.7, LDL 106; optimize risk factors as below -Neuro stroke team following, follow up recs:  -Permissive HTN up to 220/120 x 48 hours, hold home amlodipine for now, restart later today  -Clopidogrel 75 mg/ASA 81 mg for 3 weeks, followed by aspirin alone -Loop recorder outpatient

## 2023-04-06 NOTE — Assessment & Plan Note (Signed)
TOC is working on placement, but patient will likely need LTC after and less likely he will be able to go home for independent living.  Noted the patient has a history of severe untreated depression, could be contributing to baseline confusion. -TOC consult for placement

## 2023-04-06 NOTE — Assessment & Plan Note (Signed)
LDL 106 this admission, goal <70. -Start atorvastatin 80 mg for high intensity therapy

## 2023-04-06 NOTE — [place] FL2 (Signed)
[place] MEDICAID FL2 LEVEL OF CARE FORM     IDENTIFICATION  Patient Name: Jacob [name] Birthdate: 09/27/1955 Sex: male Admission Date (Current Location): 04/04/2023  [facility] and [place] Number:  Producer, television/film/video and Address:  [facility], [address] [place], [place] [address]      Provider Number: [id number]  Attending Physician Name and Address:  Billey Co, MD  Relative Name and Phone Number:       Current Level of Care: Hospital Recommended Level of Care: Skilled Nursing Facility Prior Approval Number:    Date Approved/Denied:   PASRR Number: [id number]  Discharge Plan: SNF    Current Diagnoses: Patient Active Problem List   Diagnosis Date Noted   Dysuria 04/06/2023   Dementia (HCC) 04/06/2023   T2DM (type 2 diabetes mellitus) (HCC) 04/06/2023   CVA (cerebral vascular accident) (HCC) 04/05/2023   Acute cerebral infarction (HCC) 04/04/2023   Prostate cancer (HCC) 02/14/2022   Urinary retention 07/28/2021   BPH (benign prostatic hyperplasia) 03/17/2021   Amber-colored urine 11/15/2019   Housing problems 03/21/2019   Severe recurrent major depression without psychotic features (HCC) 10/04/2018   Depression, recurrent (HCC) 09/17/2018   Urinary frequency 02/09/2018   Syncope and collapse 06/09/2016   Hepatic steatosis 06/09/2016   Biliary colic    Uncontrolled type 2 diabetes mellitus with diabetic neuropathy    ACS (acute coronary syndrome) (HCC) 06/08/2016   Erectile dysfunction 08/23/2013   Ventral hernia with obstruction 09/09/2011   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   OSA (obstructive sleep apnea) 08/23/2011   Colonic polyp 08/04/2011   Cataract 12/01/2010   Chest pain 07/15/2010   Type II diabetes mellitus with neurological manifestations, uncontrolled 10/03/2008   HYPERLIPIDEMIA 02/19/2007   Gout, unspecified 06/29/2006   Obesity, Class II, BMI 35-39.9, with comorbidity 06/29/2006   MDD (major depressive  disorder), recurrent episode, mild (HCC) 06/29/2006   Anxiety state 06/29/2006   Essential hypertension, benign 06/29/2006    Orientation RESPIRATION BLADDER Height & Weight     Self, Place  Normal Continent Weight: 176 lb (79.8 kg) Height:  5\' 9"  (175.3 cm)  BEHAVIORAL SYMPTOMS/MOOD NEUROLOGICAL BOWEL NUTRITION STATUS      Continent Diet (carb modified)  AMBULATORY STATUS COMMUNICATION OF NEEDS Skin   Limited Assist Verbally Normal                       Personal Care Assistance Level of Assistance  Bathing, Feeding, Dressing Bathing Assistance: Limited assistance Feeding assistance: Independent Dressing Assistance: Limited assistance     Functional Limitations Info  Speech     Speech Info: Impaired    SPECIAL CARE FACTORS FREQUENCY  PT (By licensed PT), OT (By licensed OT)     PT Frequency: 5x/wk OT Frequency: 5x/wk            Contractures Contractures Info: Not present    Additional Factors Info  Code Status, Allergies Code Status Info: Full Allergies Info: Methylprednisolone Sodium Succ, Ms Contin (Morphine)           Current Medications (04/06/2023):  This is the current hospital active medication list Current Facility-Administered Medications  Medication Dose Route Frequency Provider Last Rate Last Admin   aspirin chewable tablet 81 mg  81 mg Oral Daily Gevena Mart A, NP   81 mg at 04/06/23 0834   atorvastatin (LIPITOR) tablet 80 mg  80 mg Oral Daily Shitarev, Dimitry, MD   80 mg at 04/06/23 [phone]  clopidogrel (PLAVIX) tablet 75 mg  75 mg Oral Daily Reome, Earle J, RPH   75 mg at 04/06/23 0834   enoxaparin (LOVENOX) injection 40 mg  40 mg Subcutaneous Q24H Celine Mans, MD   40 mg at 04/05/23 1939   insulin aspart (novoLOG) injection 0-9 Units  0-9 Units Subcutaneous TID WC Shitarev, Dimitry, MD   2 Units at 04/06/23 1322   venlafaxine XR (EFFEXOR-XR) 24 hr capsule 37.5 mg  37.5 mg Oral Q breakfast Shitarev, Dimitry, MD   37.5 mg at 04/06/23  1914     Discharge Medications: Please see discharge summary for a list of discharge medications.  Relevant Imaging Results:  Relevant Lab Results:   Additional Information SS#: [id number]  Baldemar Lenis, LCSW

## 2023-04-06 NOTE — Assessment & Plan Note (Addendum)
A1c 6.7 this admission, goal <7.  Not on any medications outpatient.  CBGs stable. -Sensitive SSI -CBGs ACHS

## 2023-04-06 NOTE — Progress Notes (Signed)
Daily Progress Note Intern Pager: [phone]  Patient name: Jacob [name] Medical record number: [id number] Date of birth: April 14, 1956 Age: 67 y.o. Gender: male  Primary Care Provider: [name], DO Consultants: Neurology Code Status: Full  Pt Overview and Major Events to Date:  12/3 - Admitted, multiple infarctions on MRI 12/4 - TOC consulted for SNF placement, but difficult discharge and working with daughter   Assessment and Plan: Jacob [name] is a 67 y.o. male with a pertinent PMH of HLD, HTN, T2DM, OSA, MDD, prostate cancer status post salvage radiation treatment who presented with slurred speech and R side weakness, admitted for CVA, currently undergoing stroke workup.  Patient remains near baseline without focal neurological deficit on exam, still somewhat confused but unclear how far this is from baseline given likely dementia and chronic microvascular changes consistent with history of stroke.  Per PT, patient is discharged to SNF.  Patient is adamant he does not want to go to SNF, but there is concern that he has not prepared for independent living.  Sister called for collateral phone and states that she will talk to him about discharge planning.  Difficult to determine if patient has capacity at baseline, does seem to have understanding of medical situation.  No evidence of cardioembolic etiology following TTE, no critical stenosis on CTA neck.  Restarted clopidogrel per neurology. Assessment & Plan CVA (cerebral vascular accident) (HCC) MRI brain showing acute infarction left basal ganglia/corona radiata, left posterior temporal cortex, posterior left pons without hemorrhage.  No focal neurological deficit, no post CVA deficits identified on exam.  Echo unremarkable, less concerning for cardioembolic etiology but will need loop recorder outpatient. -PT/OT eval and treat, recommending SNF -Cardiac monitoring 48 hours -A1c 6.7, LDL 106; optimize risk factors as  below -Neuro stroke team following, follow up recs:  -Permissive HTN up to 220/120 x 48 hours, hold home amlodipine for now, restart later today  -Clopidogrel 75 mg/ASA 81 mg for 3 weeks, followed by aspirin alone -Loop recorder outpatient T2DM (type 2 diabetes mellitus) (HCC) A1c 6.7 this admission, goal <7.  Not on any medications outpatient.  CBGs stable. -Sensitive SSI -CBGs ACHS HYPERLIPIDEMIA LDL 106 this admission, goal <70. -Start atorvastatin 80 mg for high intensity therapy Dementia (HCC) TOC is working on placement, but patient will likely need LTC after and less likely he will be able to go home for independent living.  Noted the patient has a history of severe untreated depression, could be contributing to baseline confusion. -TOC consult for placement Dysuria Does have prostate cancer and did receive salvage radiation treatment directed to the prostatic fossa and pelvic lymph nodes and combination of ADT starting in September.  No evidence of retention on bladder scans, UA negative for UTI. -Strict I's and O's  Chronic and Stable Problems: Anxiety/Depression: Restart venlafaxine XR 37.5 mg daily HLD: Start atorvastatin 80 mg daily as above  FEN/GI: Carb modified diet PPx: Enoxaparin Dispo: SNF  pending placement.  Subjective:  This morning, patient is somewhat irritated, adamant he does not want to go to SNF.  States that he is stable on his feet and is oriented to self, place, situation.  Objective: Temp:  [98 F (36.7 C)-98.8 F (37.1 C)] 98.2 F (36.8 C) (12/05 0737) Pulse Rate:  [75-102] 77 (12/05 0737) Resp:  [18-21] 18 (12/05 0737) BP: (111-152)/(62-96) 152/80 (12/05 0737) SpO2:  [94 %-100 %] 96 % (12/05 0737)  Physical Exam: General: Age-appropriate, sitting up in bed, NAD, alert.  Cardiovascular: Regular rate and rhythm. Normal S1/S2. No murmurs, rubs, or gallops appreciated. 2+ radial pulses. Pulmonary: Clear bilaterally to ascultation. No increased  WOB, no accessory muscle usage on room air. No wheezes, rales, or crackles. Extremities: No peripheral edema bilaterally. Neuro: Oriented to self, place, situation.  No cranial nerve deficits II-XII.  No dysarthria, dysmetria.  FTN normal.  PERRLA.  Stable gait. Psych: Moderately irritated anxious.  Laboratory: Most recent CBC Lab Results  Component Value Date   WBC 9.2 04/04/2023   HGB 12.6 (L) 04/04/2023   HCT 37.0 (L) 04/04/2023   MCV 82.2 04/04/2023   PLT 158 04/04/2023   Most recent BMP    Latest Ref Rng & Units 04/06/2023    6:20 AM  BMP  Glucose 70 - 99 mg/dL 161   BUN 8 - 23 mg/dL 17   Creatinine 0.96 - 1.24 mg/dL 0.45   Sodium 409 - 811 mmol/L 137   Potassium 3.5 - 5.1 mmol/L 3.9   Chloride 98 - 111 mmol/L 101   CO2 22 - 32 mmol/L 26   Calcium 8.9 - 10.3 mg/dL 8.7     Other pertinent labs: -None  New Imaging/Diagnostic Tests: -TTE: EF 50-55%.  No regional wall motion abnormalities, no thrombi.  WNL.  Briar Sword Sharion Dove, MD 04/06/2023, 8:23 AM  PGY-1, [facility] Family Medicine FPTS Intern pager: [phone], text pages welcome Secure chat group [facility] Teaching Service

## 2023-04-06 NOTE — Discharge Instructions (Addendum)
Dear Guss Bunde,   Thank you for letting us participate in your care! In this section, you will find a brief hospital admission summary of why you were admitted to the hospital, what happened during your admission, your diagnosis/diagnoses, and recommended follow up.  You are diagnosed with a stroke, seen by neurology and started on Plavix and aspirin for management.  POST-HOSPITAL & CARE INSTRUCTIONS We recommend following up with your PCP within 1 week from being discharged from the hospital. Please let PCP/Specialists know of any changes in medications that were made which you will be able to see in the medications section of this packet. Please also follow up with cardiology for loop recorder  DOCTOR'S APPOINTMENTS & FOLLOW UP Future Appointments  Date Time Provider Department Center  04/06/2023  9:00 AM Gwyndolyn Saxon, LCSW [facility] None     Thank you for choosing [facility]! Take care and be well!  Family Medicine Teaching Service Inpatient Team [place]  [facility]  [address] [place], [place] [address] [phone]

## 2023-04-06 NOTE — Assessment & Plan Note (Signed)
Complaining of dysuria on admission, now with some possible suprapubic fullness.  Does have prostate cancer and did receive salvage radiation treatment directed to the prostatic fossa and pelvic lymph nodes and combination of ADT starting in September.  Was not retaining urine at PCP visit 10/28, but will ensure that is still true. -Bladder scan Q8h x 3 occurrences -Strict I's and O's

## 2023-04-07 DIAGNOSIS — I639 Cerebral infarction, unspecified: Secondary | ICD-10-CM | POA: Diagnosis not present

## 2023-04-07 LAB — GLUCOSE, CAPILLARY
Glucose-Capillary: 132 mg/dL — ABNORMAL HIGH (ref 70–99)
Glucose-Capillary: 137 mg/dL — ABNORMAL HIGH (ref 70–99)
Glucose-Capillary: 134 mg/dL — ABNORMAL HIGH (ref 70–99)
Glucose-Capillary: 158 mg/dL — ABNORMAL HIGH (ref 70–99)
Glucose-Capillary: 154 mg/dL — ABNORMAL HIGH (ref 70–99)
Glucose-Capillary: 182 mg/dL — ABNORMAL HIGH (ref 70–99)

## 2023-04-07 LAB — VITAMIN B12: Vitamin B-12: 129 pg/mL — ABNORMAL LOW (ref 180–914)

## 2023-04-07 LAB — RPR: RPR Ser Ql: NONREACTIVE

## 2023-04-07 MED ORDER — CLOPIDOGREL BISULFATE 75 MG PO TABS: 75.0000 mg | ORAL_TABLET | Freq: Every day | ORAL | Status: AC

## 2023-04-07 MED ORDER — ATORVASTATIN CALCIUM 80 MG PO TABS: 80.0000 mg | ORAL_TABLET | Freq: Every day | ORAL | Status: AC

## 2023-04-07 MED ORDER — ASPIRIN 81 MG PO CHEW: 81.0000 mg | CHEWABLE_TABLET | Freq: Every day | ORAL | Status: AC

## 2023-04-07 NOTE — Assessment & Plan Note (Signed)
TOC is working on placement to SNF, sister in agreement and patient gradually accepting.  Noted the patient has a history of severe untreated depression, could be contributing to baseline confusion. -TOC consult for placement

## 2023-04-07 NOTE — Progress Notes (Signed)
Daily Progress Note Intern Pager: [phone]  Patient name: Jacob [name] Medical record number: [id number] Date of birth: 1955/08/22 Age: 67 y.o. Gender: male  Primary Care Provider: [name], DO Consultants: Neurology Code Status: Full  Pt Overview and Major Events to Date:  12/3 - Admitted, multiple infarctions on MRI 12/4 - TOC consulted for SNF placement, but difficult discharge and working with sister   Assessment and Plan: Jacob [name] is a 67 y.o. male with a pertinent PMH of HLD, HTN, T2DM, OSA, MDD, prostate cancer status post salvage radiation treatment who presented with slurred speech and R side weakness, admitted for CVA, currently undergoing stroke workup.  TOC is working on placement, patient does not yet have any bed offers.  [name] hesitant about going to SNF but his sister and their team are encouraging him.  Believe he would benefit from acute rehab to reduce risk of rehospitalization and falls at home.  His neurological exam remains benign, but he is somewhat confused.  Believe patient also has an underlying component of depression, will benefit from outpatient follow-up. Assessment & Plan CVA (cerebral vascular accident) (HCC) MRI brain showing acute infarction left basal ganglia/corona radiata, left posterior temporal cortex, posterior left pons without hemorrhage.  No focal neurological deficit, no post CVA deficits identified on exam.  Echo unremarkable, less concerning for cardioembolic etiology but will need loop recorder outpatient. -PT/OT eval and treat, recommending SNF -Cardiac monitoring 48 hours -A1c 6.7, LDL 106; optimize risk factors as below -Neuro stroke team following, follow up recs:  -Permissive HTN up to 220/120 x 48 hours, hold home amlodipine for now, restart later today  -Clopidogrel 75 mg/ASA 81 mg for 3 weeks, followed by aspirin alone -Loop recorder outpatient T2DM (type 2 diabetes mellitus) (HCC) A1c 6.7 this admission, goal  <7.  Not on any medications outpatient.  CBGs stable. -Sensitive SSI -CBGs ACHS HYPERLIPIDEMIA LDL 106 this admission, goal <70. -Start atorvastatin 80 mg for high intensity therapy Dementia (HCC) TOC is working on placement to SNF, sister in agreement and patient gradually accepting.  Noted the patient has a history of severe untreated depression, could be contributing to baseline confusion. -TOC consult for placement Dysuria Does have prostate cancer and did receive salvage radiation treatment directed to the prostatic fossa and pelvic lymph nodes and combination of ADT starting in September.  No evidence of retention on bladder scans, UA negative for UTI. -Strict I's and O's  Chronic and Stable Problems: Anxiety/Depression: Restart venlafaxine XR 37.5 mg daily HLD: Start atorvastatin 80 mg daily as above  FEN/GI: Carb modified diet PPx: Enoxaparin Dispo: SNF  pending placement .  Subjective:  This morning, patient is hesitant about going to SNF.  He becomes tearful and states he is "scared" and overwhelmed by the process of hospitalization and discharge.  Is worried about going to skilled nursing facility and does prefer to go home, but grudgingly agrees to SNF with encouragement.  Reports he is "bored" here.  Objective: Temp:  [97.9 F (36.6 C)-99 F (37.2 C)] 98 F (36.7 C) (12/06 1133) Pulse Rate:  [69-98] 98 (12/06 1133) Resp:  [16-20] 18 (12/06 1133) BP: (136-160)/(73-118) 136/89 (12/06 1133) SpO2:  [94 %-100 %] 94 % (12/06 1133)  Physical Exam: General: Adult male, resting on edge of bed, somewhat anxious, NAD, alert and at baseline. Cardiovascular: Regular rate and rhythm. Normal S1/S2. No murmurs, rubs, or gallops appreciated. 2+ radial pulses. Extremities: No peripheral edema bilaterally. Psych: Tearful affect, depressed  mood.  Laboratory: Most recent CBC Lab Results  Component Value Date   WBC 9.2 04/04/2023   HGB 12.6 (L) 04/04/2023   HCT 37.0 (L)  04/04/2023   MCV 82.2 04/04/2023   PLT 158 04/04/2023   Most recent BMP    Latest Ref Rng & Units 04/06/2023    6:20 AM  BMP  Glucose 70 - 99 mg/dL 478   BUN 8 - 23 mg/dL 17   Creatinine 2.95 - 1.24 mg/dL 6.21   Sodium 308 - 657 mmol/L 137   Potassium 3.5 - 5.1 mmol/L 3.9   Chloride 98 - 111 mmol/L 101   CO2 22 - 32 mmol/L 26   Calcium 8.9 - 10.3 mg/dL 8.7     Other pertinent labs: -None  New Imaging/Diagnostic Tests: -None  Jacob Barradas, MD 04/07/2023, 1:35 PM  PGY-1, [facility] FPTS Intern pager: [phone], text pages welcome Secure chat group [facility] Teaching Service

## 2023-04-07 NOTE — TOC Progression Note (Addendum)
Transition of Care [facility]) - Progression Note    Patient Details  Name: Jacob [name] MRN: [id number] Date of Birth: July 10, 1955  Transition of Care [facility]) CM/SW Contact  Baldemar Lenis, [place] Phone Number: 04/07/2023, 2:32 PM  Clinical Narrative:   CSW attempted to meet with patient, he appeared confused during discussion and per RN, has been calling [phone] throughout the morning without realizing that he has been doing it. CSW updated MD and contacted patient's sister to engage in SNF discussion. Sister is hopeful that the patient can be placed near where she lives in [place], preference for [facility] if possible. Sister asked questions about long term care, and CSW explained that we discharge to SNF for rehab and not LTC. Sister indicated understanding. CSW sent referral out in the [place] area, left a voicemail with [facility] to ask them to review. CSW to follow.  UPDATE: CSW spoke with [facility], they are able to offer a bed for patient for rehab. CSW requested CMA to initiate insurance authorization. CSW to follow.    Expected Discharge Plan: Skilled Nursing Facility Barriers to Discharge: Continued Medical Work up, English as a second language teacher  Expected Discharge Plan and Services     Post Acute Care Choice: NA Living arrangements for the past 2 months: Apartment                                       Social Determinants of Health (SDOH) Interventions SDOH Screenings   Food Insecurity: No Food Insecurity (04/04/2023)  Housing: Low Risk  (04/04/2023)  Transportation Needs: No Transportation Needs (04/04/2023)  Utilities: Not At Risk (04/04/2023)  Alcohol Screen: Low Risk  (10/04/2018)  Depression (PHQ2-9): High Risk (02/27/2023)  Financial Resource Strain: High Risk (11/16/2022)  Stress: Stress Concern Present (04/16/2019)  Tobacco Use: Low Risk  (04/04/2023)    Readmission Risk Interventions     No data to display

## 2023-04-07 NOTE — Assessment & Plan Note (Signed)
 A1c 6.7 this admission, goal <7.  Not on any medications outpatient.  CBGs stable. -Sensitive SSI -CBGs ACHS

## 2023-04-07 NOTE — Discharge Summary (Incomplete)
Family Medicine Teaching [facility] Discharge Summary  Patient name: Jacob [name] Medical record number: [id number] Date of birth: 1955-10-23 Age: 67 y.o. Gender: male Date of Admission: 04/04/2023  Date of Discharge: 04/07/2023 Admitting Physician: Westley Chandler, MD  Primary Care Provider: [name], DO Consultants: Neurology  Indication for Hospitalization: Right-sided weakness  Discharge Diagnoses/Problem List:  Principal Problem for Admission: CVA Other Problems addressed during stay:  Principal Problem:   Acute cerebral infarction [facility]) Active Problems:   HYPERLIPIDEMIA   CVA (cerebral vascular accident) (HCC)   Dysuria   Dementia (HCC)   T2DM (type 2 diabetes mellitus) [facility])   Brief Hospital Course:  THOMPSON GATO is a 67 y.o.male with a history of prostate cancer, HLD, HTN, OSA, T2DM, GERD, gout, anxiety and depression who was admitted to the [facility] Teaching Service at [facility] for neurological deficits. His hospital course is detailed below:  Acute infarction Resolved on admission, MR showed acute infarcts in the left basal ganglia/corona radiata, left posterior temporal cortex, and posterior left pons. 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 Neuro recommended Plavix and aspirin x 3 weeks and then aspirin 81 mg alone.  Echo normal, but recommended 30-day loop recorder to evaluate for underlying A-fib.  Dysuria UA negative, voiding spontaneously throughout admission with appropriate bladder scans.   Other chronic conditions were medically managed with home medications and formulary alternatives as necessary (***)  PCP Follow-up Recommendations: Reversible causes workup for cognitive decline 30 Day Loop recorder discharge   Disposition: SNF  Discharge Condition: Stable, no focal deficits  Discharge Exam:  Vitals:   04/07/23 0349 04/07/23 0741  BP: (!) 154/118 (!) 160/79  Pulse: 89 76  Resp:  17 18  Temp: 98.3 F (36.8 C) 98 F (36.7 C)  SpO2: 97% 98%   Physical Exam: General: *** Eyes: *** ENTM: *** Neck: *** Cardiovascular: *** Respiratory: ***on room air Gastrointestinal: *** MSK: *** Extremities: *** Derm: *** Neuro: *** Psych: ***  Significant Procedures: ***  Significant Labs and Imaging:  No results for input(s): "WBC", "HGB", "HCT", "PLT" in the last 48 hours. Recent Labs  Lab 04/06/23 0620  NA 137  K 3.9  CL 101  CO2 26  GLUCOSE 175*  BUN 17  CREATININE 1.04  CALCIUM 8.7*  MG 1.6*    -MRI brain: MRI brain showing acute infarction left basal ganglia/corona radiata, left posterior temporal cortex, posterior left pons without hemorrhage. 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  No regional wall motion abnormalities, no thrombi.  WNL.  -EEG: Normal  Results/Tests Pending at Time of Discharge: ***  Discharge Medications:  Allergies as of 04/07/2023       Reactions   Methylprednisolone Sodium Succ    Respiratory failure Pt states he has never had respiratory failure, does not recall this reaction   Ms Contin [morphine] Nausea Only     Med Rec must be completed prior to using this [facility]***       Discharge Instructions: Please refer to Patient Instructions section of EMR for full details.  Patient was counseled important signs and symptoms that should prompt return to medical care, changes in medications, dietary instructions, activity restrictions, and follow up appointments.   Follow-Up Appointments: No future appointments.  Nathaneal Sommers,  MD 04/07/2023, 8:16 AM PGY-1, [facility] Family Medicine

## 2023-04-07 NOTE — Progress Notes (Signed)
Physical Therapy Treatment Patient Details Name: Jacob [name] [name] MRN: [id number] DOB: 09/25/1955 Today's Date: 04/07/2023   History of Present Illness 67 y.o. male presenting with slurred speech and right sided weakness. 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 PMH of HLD, HTN, anxiety, DM II, gout, hx of prostate cancer.    PT Comments  Pt resting seated up EOB on arrival and agreeable to session. Pt continues to be limited by decreased activity tolerance, impaired balance/postural reactions, impaired cognition and decreased insight into deficits and safety. Pt requiring min A for gait to steady and prevent LOB as pt with R/L drift in hall and gross instability with staggering steps appreciated when mild balance challenges (environmental scanning and change of gait direction) introduced. Pt oriented to self only during session and needing max cues for safety as pt demonstrating difficulty attending to task at hand and max cues for navigation as pt unable to locate room on return. Pt scoring 16/24 on DGI indicating pt at high risk for falls. Current plan remains appropriate to address deficits and maximize functional independence and decrease caregiver burden. Pt continues to benefit from skilled PT services to progress toward functional mobility goals.     If plan is discharge home, recommend the following: A little help with walking and/or transfers;A little help with bathing/dressing/bathroom;Supervision due to cognitive status;Assistance with cooking/housework;Direct supervision/assist for medications management;Assist for transportation;Help with stairs or ramp for entrance   Can travel by private vehicle     Yes  Equipment Recommendations  Other (comment) (Per accepting facility)    Recommendations for Other Services       Precautions / Restrictions Precautions Precautions: Fall Restrictions Weight  Bearing Restrictions: No     Mobility  Bed Mobility Overal bed mobility: Needs Assistance             General bed mobility comments: pt seated up EOB on arrival    Transfers Overall transfer level: Needs assistance Equipment used: None Transfers: Sit to/from Stand Sit to Stand: Contact guard assist           General transfer comment: CGA for safety, steady rise without LOB    Ambulation/Gait Ambulation/Gait assistance: Contact guard assist, Min assist Gait Distance (Feet): 120 Feet (x2) Assistive device: None Gait Pattern/deviations: Drifts right/left, Decreased stride length, Step-through pattern Gait velocity: decreased     General Gait Details: R/L drift in hall needing min A to steady, exagerated with balance challenges, general instability noted with stggering steps with head turns( R/L and up down) and 180 degree turn needing min A to prevent LOB   Stairs             Wheelchair Mobility     Tilt Bed    Modified Rankin (Stroke Patients Only)       Balance Overall balance assessment: Needs assistance Sitting-balance support: Feet supported, Bilateral upper extremity supported Sitting balance-Leahy Scale: Good     Standing balance support: During functional activity, No upper extremity supported Standing balance-Leahy Scale: Fair                   Standardized Balance Assessment Standardized Balance Assessment : Dynamic Gait Index   Dynamic Gait Index Level Surface: Normal Change in Gait Speed: Mild Impairment Gait with Horizontal Head Turns: Mild Impairment Gait with Vertical Head Turns: Mild Impairment Gait and Pivot Turn: Mild Impairment Step Over Obstacle: Mild Impairment Step Around Obstacles: Moderate Impairment Steps: Mild  Impairment Total Score: 16      Cognition Arousal: Alert Behavior During Therapy: WFL for tasks assessed/performed, Impulsive Overall Cognitive Status: No family/caregiver present to determine  baseline cognitive functioning                                 General Comments: Disoriented to time, place and situation, reporting he is in [place] and stating "I dont keep up with that" when asked date and year. pt stating that he has 2 cats, only able to recall name of one cat but able to describe both. some impulsivity during session needing cues for safety and to wait for this PTA and directions        Exercises      General Comments        Pertinent Vitals/Pain Pain Assessment Pain Assessment: No/denies pain    Home Living                          Prior Function            PT Goals (current goals can now be found in the care plan section) Acute Rehab PT Goals Patient Stated Goal: to go home PT Goal Formulation: With patient Time For Goal Achievement: 04/19/23 Potential to Achieve Goals: Good Progress towards PT goals: Progressing toward goals    Frequency    Min 1X/week      PT Plan      Co-evaluation              AM-PAC PT "6 Clicks" Mobility   Outcome Measure  Help needed turning from your back to your side while in a flat bed without using bedrails?: A Little Help needed moving from lying on your back to sitting on the side of a flat bed without using bedrails?: A Little Help needed moving to and from a bed to a chair (including a wheelchair)?: A Little Help needed standing up from a chair using your arms (e.g., wheelchair or bedside chair)?: A Little Help needed to walk in hospital room?: A Lot Help needed climbing 3-5 steps with a railing? : A Lot 6 Click Score: 16    End of Session Equipment Utilized During Treatment: Gait belt Activity Tolerance: Patient tolerated treatment well Patient left: in bed;with call bell/phone within reach;Other (comment) (seated up EOB) Nurse Communication: Mobility status PT Visit Diagnosis: Other abnormalities of gait and mobility (R26.89)     Time: 4098-1191 PT Time  Calculation (min) (ACUTE ONLY): 14 min  Charges:    $Therapeutic Activity: 8-22 mins PT General Charges $$ ACUTE PT VISIT: 1 Visit                     Robie Mcniel R. PTA Acute Rehabilitation Services Office: [phone]   Catalina Antigua 04/07/2023, 3:21 PM

## 2023-04-07 NOTE — Assessment & Plan Note (Signed)
 MRI brain showing acute infarction left basal ganglia/corona radiata, left posterior temporal cortex, posterior left pons without hemorrhage.  No focal neurological deficit, no post CVA deficits identified on exam.  Echo unremarkable, less concerning for cardioembolic etiology but will need loop recorder outpatient. -PT/OT eval and treat, recommending SNF -Cardiac monitoring 48 hours -A1c 6.7, LDL 106; optimize risk factors as below -Neuro stroke team following, follow up recs:  -Permissive HTN up to 220/120 x 48 hours, hold home amlodipine for now, restart later today  -Clopidogrel 75 mg/ASA 81 mg for 3 weeks, followed by aspirin alone -Loop recorder outpatient

## 2023-04-07 NOTE — Plan of Care (Signed)

## 2023-04-07 NOTE — Patient Outreach (Signed)
  Care Coordination   Follow Up Visit Note   04/07/2023 Name: Jacob [name] MRN: [id number] DOB: [birth year]  Jacob [name] is a 67 y.o. year old male who sees Jacob [name], [place] for primary care. I spoke with  Jacob [name] sister Jacob [name] by phone today.  What matters to the patients health and wellness today?  Ms. Jacob [name] reports pt is curently in the hosptial and will be transferred to a skilled nursing facility    Goals Addressed   None     SDOH assessments and interventions completed:  No     Care Coordination Interventions:  No, not indicated   Follow up plan: No further intervention required.   Encounter Outcome:  Patient Visit Completed   Jacob [name] MSW, LCSW Licensed Clinical Social Worker  Jacob [name], Population Health Direct Dial: [phone]  Fax: [phone]

## 2023-04-07 NOTE — Assessment & Plan Note (Signed)
 Does have prostate cancer and did receive salvage radiation treatment directed to the prostatic fossa and pelvic lymph nodes and combination of ADT starting in September.  No evidence of retention on bladder scans, UA negative for UTI. -Strict I's and O's

## 2023-04-07 NOTE — Assessment & Plan Note (Signed)
 LDL 106 this admission, goal <70. -Start atorvastatin 80 mg for high intensity therapy

## 2023-04-08 DIAGNOSIS — R109 Unspecified abdominal pain: Secondary | ICD-10-CM | POA: Insufficient documentation

## 2023-04-08 DIAGNOSIS — I639 Cerebral infarction, unspecified: Secondary | ICD-10-CM | POA: Diagnosis not present

## 2023-04-08 LAB — COMPREHENSIVE METABOLIC PANEL
ALT: 18 U/L (ref 0–44)
AST: 18 U/L (ref 15–41)
Albumin: 3.5 g/dL (ref 3.5–5.0)
Alkaline Phosphatase: 69 U/L (ref 38–126)
Anion gap: 11 (ref 5–15)
BUN: 15 mg/dL (ref 8–23)
CO2: 27 mmol/L (ref 22–32)
Calcium: 9.2 mg/dL (ref 8.9–10.3)
Chloride: 104 mmol/L (ref 98–111)
Creatinine, Ser: 0.95 mg/dL (ref 0.61–1.24)
GFR, Estimated: >60 mL/min (ref >60)
Glucose, Bld: 120 mg/dL — ABNORMAL HIGH (ref 70–99)
Potassium: 3.8 mmol/L (ref 3.5–5.1)
Sodium: 142 mmol/L (ref 135–145)
Total Bilirubin: 0.7 mg/dL (ref <1.2)
Total Protein: 7.3 g/dL (ref 6.5–8.1)

## 2023-04-08 LAB — GLUCOSE, CAPILLARY
Glucose-Capillary: 142 mg/dL — ABNORMAL HIGH (ref 70–99)
Glucose-Capillary: 117 mg/dL — ABNORMAL HIGH (ref 70–99)
Glucose-Capillary: 174 mg/dL — ABNORMAL HIGH (ref 70–99)
Glucose-Capillary: 194 mg/dL — ABNORMAL HIGH (ref 70–99)

## 2023-04-08 LAB — CBC
HCT: 38.6 % — ABNORMAL LOW (ref 39.0–52.0)
Hemoglobin: 13.3 g/dL (ref 13.0–17.0)
MCH: 28.2 pg (ref 26.0–34.0)
MCHC: 34.5 g/dL (ref 30.0–36.0)
MCV: 82 fL (ref 80.0–100.0)
Platelets: 172 10*3/uL (ref 150–400)
RBC: 4.71 MIL/uL (ref 4.22–5.81)
RDW: 13.4 % (ref 11.5–15.5)
WBC: 9.4 10*3/uL (ref 4.0–10.5)
nRBC: 0 % (ref 0.0–0.2)

## 2023-04-08 MED ORDER — POLYETHYLENE GLYCOL 3350 17 G PO PACK
17.0000 g | PACK | Freq: Every day | ORAL | Status: DC
  Administered 2023-04-08 – 2023-04-11 (×4): 17 g via ORAL
  Filled 2023-04-08 (×4): qty 1

## 2023-04-08 MED ORDER — ONDANSETRON HCL 4 MG PO TABS
8.0000 mg | ORAL_TABLET | Freq: Three times a day (TID) | ORAL | Status: DC | PRN
  Administered 2023-04-08: 8 mg via ORAL
  Filled 2023-04-08: qty 2

## 2023-04-08 MED ORDER — ACETAMINOPHEN 325 MG PO TABS
650.0000 mg | ORAL_TABLET | Freq: Four times a day (QID) | ORAL | Status: DC | PRN
  Administered 2023-04-10 (×2): 650 mg via ORAL
  Filled 2023-04-08 (×2): qty 2

## 2023-04-08 MED ORDER — ORAL CARE MOUTH RINSE: 15.0000 mL | OROMUCOSAL | Status: DC | PRN

## 2023-04-08 MED ORDER — AMLODIPINE BESYLATE 5 MG PO TABS
10.0000 mg | ORAL_TABLET | Freq: Every day | ORAL | Status: DC
  Administered 2023-04-08 – 2023-04-11 (×4): 10 mg via ORAL
  Filled 2023-04-08 (×4): qty 2

## 2023-04-08 NOTE — Assessment & Plan Note (Signed)
 LDL 106 this admission, goal <70. -Start atorvastatin 80 mg for high intensity therapy

## 2023-04-08 NOTE — Assessment & Plan Note (Addendum)
MRI brain showing acute infarction left basal ganglia/corona radiata, left posterior temporal cortex, posterior left pons without hemorrhage.  No focal neurological deficit, no post CVA deficits identified on exam.  Echo unremarkable, less concerning for cardioembolic etiology but will need loop recorder outpatient. -PT/OT eval and treat, recommending SNF -Cardiac monitoring 48 hours -A1c 6.7, LDL 106; optimize risk factors as below -Neuro stroke team following, follow up recs:  -S/p 48-hour window for permissive HTN, restart home amlodipine today  -Clopidogrel 75 mg/ASA 81 mg for 3 weeks, followed by aspirin alone -Loop recorder outpatient

## 2023-04-08 NOTE — Assessment & Plan Note (Signed)
TOC is working on placement to SNF, sister in agreement and patient gradually accepting.  Noted the patient has a history of severe untreated depression, could be contributing to baseline confusion. -TOC consult for placement

## 2023-04-08 NOTE — Assessment & Plan Note (Signed)
 A1c 6.7 this admission, goal <7.  Not on any medications outpatient.  CBGs stable. -Sensitive SSI -CBGs ACHS

## 2023-04-08 NOTE — Plan of Care (Signed)
Problem: Education: Goal: Ability to describe self-care measures that may prevent or decrease complications (Diabetes Survival Skills Education) will improve 04/08/2023 0413 by Faustino Congress, RN Outcome: Progressing 04/08/2023 0411 by Faustino Congress, RN Outcome: Progressing Goal: Individualized Educational Video(s) 04/08/2023 0413 by Faustino Congress, RN Outcome: Progressing 04/08/2023 0411 by Faustino Congress, RN Outcome: Progressing   Problem: Coping: Goal: Ability to adjust to condition or change in health will improve 04/08/2023 0413 by Faustino Congress, RN Outcome: Progressing 04/08/2023 0411 by Faustino Congress, RN Outcome: Progressing   Problem: Fluid Volume: Goal: Ability to maintain a balanced intake and output will improve 04/08/2023 0413 by Faustino Congress, RN Outcome: Progressing 04/08/2023 0411 by Faustino Congress, RN Outcome: Progressing   Problem: Health Behavior/Discharge Planning: Goal: Ability to identify and utilize available resources and services will improve 04/08/2023 0413 by Faustino Congress, RN Outcome: Progressing 04/08/2023 0411 by Faustino Congress, RN Outcome: Progressing Goal: Ability to manage health-related needs will improve 04/08/2023 0413 by Faustino Congress, RN Outcome: Progressing 04/08/2023 0411 by Faustino Congress, RN Outcome: Progressing   Problem: Metabolic: Goal: Ability to maintain appropriate glucose levels will improve 04/08/2023 0413 by Faustino Congress, RN Outcome: Progressing 04/08/2023 0411 by Faustino Congress, RN Outcome: Progressing   Problem: Nutritional: Goal: Maintenance of adequate nutrition will improve 04/08/2023 0413 by Faustino Congress, RN Outcome: Progressing 04/08/2023 0411 by Faustino Congress, RN Outcome: Progressing Goal: Progress toward achieving an optimal weight will improve 04/08/2023 0413 by Faustino Congress, RN Outcome: Progressing 04/08/2023 0411 by  Faustino Congress, RN Outcome: Progressing   Problem: Skin Integrity: Goal: Risk for impaired skin integrity will decrease 04/08/2023 0413 by Faustino Congress, RN Outcome: Progressing 04/08/2023 0411 by Faustino Congress, RN Outcome: Progressing   Problem: Tissue Perfusion: Goal: Adequacy of tissue perfusion will improve 04/08/2023 0413 by Faustino Congress, RN Outcome: Progressing 04/08/2023 0411 by Faustino Congress, RN Outcome: Progressing   Problem: Education: Goal: Knowledge of General Education information will improve Description: Including pain rating scale, medication(s)/side effects and non-pharmacologic comfort measures 04/08/2023 0413 by Faustino Congress, RN Outcome: Progressing 04/08/2023 0411 by Faustino Congress, RN Outcome: Progressing   Problem: Health Behavior/Discharge Planning: Goal: Ability to manage health-related needs will improve 04/08/2023 0413 by Faustino Congress, RN Outcome: Progressing 04/08/2023 0411 by Faustino Congress, RN Outcome: Progressing   Problem: Clinical Measurements: Goal: Ability to maintain clinical measurements within normal limits will improve 04/08/2023 0413 by Faustino Congress, RN Outcome: Progressing 04/08/2023 0411 by Faustino Congress, RN Outcome: Progressing Goal: Will remain free from infection 04/08/2023 0413 by Faustino Congress, RN Outcome: Progressing 04/08/2023 0411 by Faustino Congress, RN Outcome: Progressing Goal: Diagnostic test results will improve 04/08/2023 0413 by Faustino Congress, RN Outcome: Progressing 04/08/2023 0411 by Faustino Congress, RN Outcome: Progressing Goal: Respiratory complications will improve 04/08/2023 0413 by Faustino Congress, RN Outcome: Progressing 04/08/2023 0411 by Faustino Congress, RN Outcome: Progressing Goal: Cardiovascular complication will be avoided 04/08/2023 0413 by Faustino Congress, RN Outcome: Progressing 04/08/2023 0411 by Faustino Congress, RN Outcome: Progressing   Problem: Activity: Goal: Risk for activity intolerance will decrease 04/08/2023 0413 by Faustino Congress, RN Outcome: Progressing 04/08/2023 0411 by Faustino Congress, RN Outcome: Progressing   Problem: Nutrition: Goal: Adequate nutrition will be maintained 04/08/2023 0413 by Faustino Congress, RN Outcome: Progressing 04/08/2023 0411 by Faustino Congress, RN Outcome: Progressing  Problem: Coping: Goal: Level of anxiety will decrease 04/08/2023 0413 by Faustino Congress, RN Outcome: Progressing 04/08/2023 0411 by Faustino Congress, RN Outcome: Progressing   Problem: Elimination: Goal: Will not experience complications related to bowel motility 04/08/2023 0413 by Faustino Congress, RN Outcome: Progressing 04/08/2023 0411 by Faustino Congress, RN Outcome: Progressing Goal: Will not experience complications related to urinary retention 04/08/2023 0413 by Faustino Congress, RN Outcome: Progressing 04/08/2023 0411 by Faustino Congress, RN Outcome: Progressing   Problem: Pain Management: Goal: General experience of comfort will improve 04/08/2023 0413 by Faustino Congress, RN Outcome: Progressing 04/08/2023 0411 by Faustino Congress, RN Outcome: Progressing   Problem: Safety: Goal: Ability to remain free from injury will improve 04/08/2023 0413 by Faustino Congress, RN Outcome: Progressing 04/08/2023 0411 by Faustino Congress, RN Outcome: Progressing   Problem: Skin Integrity: Goal: Risk for impaired skin integrity will decrease 04/08/2023 0413 by Faustino Congress, RN Outcome: Progressing 04/08/2023 0411 by Faustino Congress, RN Outcome: Progressing

## 2023-04-08 NOTE — Assessment & Plan Note (Signed)
Patient complains of lower bilateral abdominal pain and mild nausea this morning, abdominal exam benign.  Was able to eat dinner last night and breakfast this morning.  Has had pain like this before. -CTM -Tylenol prn for pain -Zofran prn for nausea

## 2023-04-08 NOTE — Assessment & Plan Note (Signed)
 Does have prostate cancer and did receive salvage radiation treatment directed to the prostatic fossa and pelvic lymph nodes and combination of ADT starting in September.  No evidence of retention on bladder scans, UA negative for UTI. -Strict I's and O's

## 2023-04-08 NOTE — Plan of Care (Signed)

## 2023-04-08 NOTE — Progress Notes (Addendum)
Daily Progress Note Intern Pager: [phone]  Patient name: Jacob [name] Medical record number: [id number] Date of birth: 10-Feb-1956 Age: 67 y.o. Gender: male  Primary Care Provider: [name], DO Consultants: Neurology Code Status: Full  Pt Overview and Major Events to Date:  12/3 - Admitted, multiple infarctions on MRI 12/4 - TOC consulted for SNF placement, but difficult discharge and working with sister  Assessment and Plan: Jacob [name] is a 68 y.o. male with a pertinent PMH of HLD, HTN, T2DM, OSA, MDD, prostate cancer status post salvage radiation treatment who presented with slurred speech and R side weakness, found to have CVA.   [name] medically stable for discharge to SNF, TOC working on placement Assessment & Plan CVA (cerebral vascular accident) [facility]) MRI brain showing acute infarction left basal ganglia/corona radiata, left posterior temporal cortex, posterior left pons without hemorrhage.  No focal neurological deficit, no post CVA deficits identified on exam.  Echo unremarkable, less concerning for cardioembolic etiology but will need loop recorder outpatient. -PT/OT eval and treat, recommending SNF -Cardiac monitoring 48 hours -A1c 6.7, LDL 106; optimize risk factors as below -Neuro stroke team following, follow up recs:  -S/p 48-hour window for permissive HTN, restart home amlodipine today  -Clopidogrel 75 mg/ASA 81 mg for 3 weeks, followed by aspirin alone -Loop recorder outpatient T2DM (type 2 diabetes mellitus) (HCC) A1c 6.7 this admission, goal <7.  Not on any medications outpatient.  CBGs stable. -Sensitive SSI -CBGs ACHS HYPERLIPIDEMIA LDL 106 this admission, goal <70. -Start atorvastatin 80 mg for high intensity therapy Dementia (HCC) TOC is working on placement to SNF, sister in agreement and patient gradually accepting.  Noted the patient has a history of severe untreated depression, could be contributing to baseline confusion. -TOC consult  for placement Dysuria Does have prostate cancer and did receive salvage radiation treatment directed to the prostatic fossa and pelvic lymph nodes and combination of ADT starting in September.  No evidence of retention on bladder scans, UA negative for UTI. -Strict I's and O's Abdominal pain Patient complains of lower bilateral abdominal pain and mild nausea this morning, abdominal exam benign.  Was able to eat dinner last night and breakfast this morning.  Has had pain like this before. -CTM -Tylenol prn for pain -Zofran prn for nausea   Chronic and Stable Problems: Anxiety/Depression: Restart venlafaxine XR 37.5 mg daily HLD: Start atorvastatin 80 mg daily as above  FEN/GI: Regular diet PPx: Lovenox Dispo: Pending SNF placement  Subjective:  Patient complains of abdominal pain which started yesterday described as constant and in the lower abdomen.  States he has had pain like this before, it is not new.  Has mild nausea but was able to eat dinner and breakfast this morning, no vomiting, no chest pain.  No other complaints at this time.  Objective: Temp:  [98 F (36.7 C)-98.8 F (37.1 C)] 98.6 F (37 C) (12/07 0738) Pulse Rate:  [69-98] 75 (12/07 0738) Resp:  [18-19] 18 (12/07 0738) BP: (136-177)/(71-89) 146/72 (12/07 0738) SpO2:  [93 %-100 %] 100 % (12/07 0738) Physical Exam: General: Lying in bed, NAD Cardiovascular: Irregular rhythm, regular rate Respiratory: Breathing comfortably on room air Abdomen: Bowel sounds present, soft, mild diffuse TTP without gaurding, non distended Neuro: alert, no focal deficits    Laboratory: Most recent CBC Lab Results  Component Value Date   WBC 9.2 04/04/2023   HGB 12.6 (L) 04/04/2023   HCT 37.0 (L) 04/04/2023   MCV 82.2 04/04/2023  PLT 158 04/04/2023   Most recent BMP    Latest Ref Rng & Units 04/06/2023    6:20 AM  BMP  Glucose 70 - 99 mg/dL 409   BUN 8 - 23 mg/dL 17   Creatinine 8.11 - 1.24 mg/dL 9.14   Sodium 782 - 956  mmol/L 137   Potassium 3.5 - 5.1 mmol/L 3.9   Chloride 98 - 111 mmol/L 101   CO2 22 - 32 mmol/L 26   Calcium 8.9 - 10.3 mg/dL 8.7     Jacob Alley, DO 04/08/2023, 8:08 AM  PGY-3, [facility] FPTS Intern pager: [phone], text pages welcome Secure chat group [facility] Teaching Service

## 2023-04-08 NOTE — Plan of Care (Signed)
  Problem: Education: Goal: Ability to describe self-care measures that may prevent or decrease complications (Diabetes Survival Skills Education) will improve Outcome: Progressing   Problem: Skin Integrity: Goal: Risk for impaired skin integrity will decrease Outcome: Progressing   Problem: Education: Goal: Knowledge of General Education information will improve Description: Including pain rating scale, medication(s)/side effects and non-pharmacologic comfort measures Outcome: Progressing   Problem: Health Behavior/Discharge Planning: Goal: Ability to manage health-related needs will improve Outcome: Progressing   Problem: Pain Management: Goal: General experience of comfort will improve Outcome: Progressing   Problem: Skin Integrity: Goal: Risk for impaired skin integrity will decrease Outcome: Progressing   Problem: Ischemic Stroke/TIA Tissue Perfusion: Goal: Complications of ischemic stroke/TIA will be minimized Outcome: Progressing   Problem: Coping: Goal: Will identify appropriate support needs Outcome: Progressing   Problem: Self-Care: Goal: Verbalization of feelings and concerns over difficulty with self-care will improve Outcome: Progressing

## 2023-04-09 DIAGNOSIS — I639 Cerebral infarction, unspecified: Secondary | ICD-10-CM | POA: Diagnosis not present

## 2023-04-09 LAB — GLUCOSE, CAPILLARY
Glucose-Capillary: 201 mg/dL — ABNORMAL HIGH (ref 70–99)
Glucose-Capillary: 132 mg/dL — ABNORMAL HIGH (ref 70–99)
Glucose-Capillary: 141 mg/dL — ABNORMAL HIGH (ref 70–99)
Glucose-Capillary: 203 mg/dL — ABNORMAL HIGH (ref 70–99)
Glucose-Capillary: 170 mg/dL — ABNORMAL HIGH (ref 70–99)

## 2023-04-09 NOTE — Plan of Care (Signed)
  Problem: Fluid Volume: Goal: Ability to maintain a balanced intake and output will improve Outcome: Progressing   Problem: Health Behavior/Discharge Planning: Goal: Ability to identify and utilize available resources and services will improve Outcome: Progressing   Problem: Nutritional: Goal: Maintenance of adequate nutrition will improve Outcome: Progressing   Problem: Skin Integrity: Goal: Risk for impaired skin integrity will decrease Outcome: Progressing   Problem: Education: Goal: Knowledge of General Education information will improve Description: Including pain rating scale, medication(s)/side effects and non-pharmacologic comfort measures Outcome: Progressing   Problem: Clinical Measurements: Goal: Respiratory complications will improve Outcome: Progressing Goal: Cardiovascular complication will be avoided Outcome: Progressing   Problem: Activity: Goal: Risk for activity intolerance will decrease Outcome: Progressing   Problem: Nutrition: Goal: Adequate nutrition will be maintained Outcome: Progressing   Problem: Education: Goal: Knowledge of disease or condition will improve Outcome: Progressing   Problem: Coping: Goal: Will verbalize positive feelings about self Outcome: Progressing Goal: Will identify appropriate support needs Outcome: Progressing   Problem: Health Behavior/Discharge Planning: Goal: Goals will be collaboratively established with patient/family Outcome: Progressing

## 2023-04-09 NOTE — Progress Notes (Signed)
Daily Progress Note Intern Pager: [phone]  Patient name: Jacob [name] Medical record number: [id number] Date of birth: 08-Nov-1955 Age: 67 y.o. Gender: male  Primary Care Provider: [name], DO Consultants: Neurology Code Status: Full  Pt Overview and Major Events to Date:  12/3 - Admitted, multiple infarctions on MRI 12/4 - TOC consulted for SNF placement, but difficult discharge and working with sister   Assessment and Plan: Jacob [name] is a 67 y.o. male with a pertinent PMH of HLD, HTN, T2DM, OSA, MDD, prostate cancer status post salvage radiation treatment who presented with slurred speech and R side weakness, found to have CVA.    [name] medically stable for discharge to SNF and neurology has signed off, TOC working on placement Assessment & Plan CVA (cerebral vascular accident) [facility]) MRI brain showing acute infarction left basal ganglia/corona radiata, left posterior temporal cortex, posterior left pons without hemorrhage.  Grossly neurologically intact on exam. Echo unremarkable, less concerning for cardioembolic etiology but will need loop recorder outpatient. -SNF placement, work with PT/OT as needed  -Optimize risk factors as below -Neuro stroke team following, follow up recs:  -S/p 48-hour window for permissive HTN  -Clopidogrel 75 mg/ASA 81 mg for 3 weeks, followed by aspirin alone -Loop recorder outpatient T2DM (type 2 diabetes mellitus) (HCC) A1c 6.7 this admission, goal <7.  Not on any medications outpatient.  CBGs stable. -Sensitive SSI -CBGs ACHS HYPERLIPIDEMIA LDL 106 this admission, goal <70. -Atorvastatin 80 mg for high intensity therapy Dementia (HCC) TOC is working on placement to SNF, sister in agreement and patient gradually accepting.  Noted the patient has a history of severe untreated depression, could be contributing to baseline confusion. -TOC consult for placement Dysuria Does have prostate cancer and did receive salvage radiation  treatment directed to the prostatic fossa and pelvic lymph nodes and combination of ADT starting in September.  No evidence of retention on bladder scans. -Strict I's and O's Abdominal pain None reported this AM, sleeping well prior to awakening     Chronic and Stable Problems: Anxiety/Depression: Restart venlafaxine XR 37.5 mg daily HLD: Start atorvastatin 80 mg daily as above   FEN/GI: Regular diet PPx: Lovenox Dispo: Pending SNF placement  Subjective:  No complaints this morning.   Objective: Temp:  [98.3 F (36.8 C)-99.1 F (37.3 C)] 99.1 F (37.3 C) (12/07 2344) Pulse Rate:  [68-92] 77 (12/07 2344) Resp:  [17-18] 17 (12/07 2344) BP: (128-160)/(72-104) 147/83 (12/07 2344) SpO2:  [95 %-100 %] 96 % (12/07 2344) General: Alert in no apparent distress Heart: Regular rate and rhythm with no murmurs appreciated Lungs: Normal WOB Abdomen: no abdominal pain Skin: Warm and dry    Laboratory: Most recent CBC Lab Results  Component Value Date   WBC 9.4 04/08/2023   HGB 13.3 04/08/2023   HCT 38.6 (L) 04/08/2023   MCV 82.0 04/08/2023   PLT 172 04/08/2023   Most recent BMP    Latest Ref Rng & Units 04/08/2023    1:09 PM  BMP  Glucose 70 - 99 mg/dL 323   BUN 8 - 23 mg/dL 15   Creatinine 5.57 - 1.24 mg/dL 3.22   Sodium 025 - 427 mmol/L 142   Potassium 3.5 - 5.1 mmol/L 3.8   Chloride 98 - 111 mmol/L 104   CO2 22 - 32 mmol/L 27   Calcium 8.9 - 10.3 mg/dL 9.2      Alfredo Martinez, MD 04/09/2023, 3:42 AM  PGY-3, [facility]  FPTS Intern pager: [phone], text pages welcome Secure chat group [facility] Teaching Service

## 2023-04-09 NOTE — Assessment & Plan Note (Addendum)
LDL 106 this admission, goal <70. -Atorvastatin 80 mg for high intensity therapy

## 2023-04-09 NOTE — Assessment & Plan Note (Addendum)
MRI brain showing acute infarction left basal ganglia/corona radiata, left posterior temporal cortex, posterior left pons without hemorrhage.  Grossly neurologically intact on exam. Echo unremarkable, less concerning for cardioembolic etiology but will need loop recorder outpatient. -SNF placement, work with PT/OT as needed  -Optimize risk factors as below -Neuro stroke team following, follow up recs:  -S/p 48-hour window for permissive HTN  -Clopidogrel 75 mg/ASA 81 mg for 3 weeks, followed by aspirin alone -Loop recorder outpatient

## 2023-04-09 NOTE — Assessment & Plan Note (Signed)
TOC is working on placement to SNF, sister in agreement and patient gradually accepting.  Noted the patient has a history of severe untreated depression, could be contributing to baseline confusion. -TOC consult for placement

## 2023-04-09 NOTE — Assessment & Plan Note (Addendum)
Does have prostate cancer and did receive salvage radiation treatment directed to the prostatic fossa and pelvic lymph nodes and combination of ADT starting in September.  No evidence of retention on bladder scans. -Strict I's and O's

## 2023-04-09 NOTE — Assessment & Plan Note (Deleted)
Patient complains of lower bilateral abdominal pain and mild nausea this morning, abdominal exam benign.  Was able to eat dinner last night and breakfast this morning.  Has had pain like this before. -CTM -Tylenol prn for pain -Zofran prn for nausea

## 2023-04-09 NOTE — Assessment & Plan Note (Signed)
 A1c 6.7 this admission, goal <7.  Not on any medications outpatient.  CBGs stable. -Sensitive SSI -CBGs ACHS

## 2023-04-09 NOTE — Assessment & Plan Note (Signed)
None reported this AM, sleeping well prior to awakening

## 2023-04-09 NOTE — Plan of Care (Signed)
  Problem: Education: Goal: Ability to describe self-care measures that may prevent or decrease complications (Diabetes Survival Skills Education) will improve Outcome: Progressing Goal: Individualized Educational Video(s) Outcome: Progressing   Problem: Coping: Goal: Ability to adjust to condition or change in health will improve Outcome: Progressing   Problem: Fluid Volume: Goal: Ability to maintain a balanced intake and output will improve Outcome: Progressing   Problem: Health Behavior/Discharge Planning: Goal: Ability to identify and utilize available resources and services will improve Outcome: Progressing Goal: Ability to manage health-related needs will improve Outcome: Progressing   Problem: Metabolic: Goal: Ability to maintain appropriate glucose levels will improve Outcome: Progressing   Problem: Nutritional: Goal: Maintenance of adequate nutrition will improve Outcome: Progressing Goal: Progress toward achieving an optimal weight will improve Outcome: Progressing   Problem: Skin Integrity: Goal: Risk for impaired skin integrity will decrease Outcome: Progressing   Problem: Tissue Perfusion: Goal: Adequacy of tissue perfusion will improve Outcome: Progressing   Problem: Education: Goal: Knowledge of General Education information will improve Description: Including pain rating scale, medication(s)/side effects and non-pharmacologic comfort measures Outcome: Progressing   Problem: Health Behavior/Discharge Planning: Goal: Ability to manage health-related needs will improve Outcome: Progressing   Problem: Clinical Measurements: Goal: Ability to maintain clinical measurements within normal limits will improve Outcome: Progressing Goal: Will remain free from infection Outcome: Progressing Goal: Diagnostic test results will improve Outcome: Progressing Goal: Respiratory complications will improve Outcome: Progressing Goal: Cardiovascular complication will  be avoided Outcome: Progressing   Problem: Activity: Goal: Risk for activity intolerance will decrease Outcome: Progressing   Problem: Nutrition: Goal: Adequate nutrition will be maintained Outcome: Progressing   Problem: Coping: Goal: Level of anxiety will decrease Outcome: Progressing   Problem: Elimination: Goal: Will not experience complications related to bowel motility Outcome: Progressing Goal: Will not experience complications related to urinary retention Outcome: Progressing   Problem: Pain Management: Goal: General experience of comfort will improve Outcome: Progressing   Problem: Safety: Goal: Ability to remain free from injury will improve Outcome: Progressing   Problem: Skin Integrity: Goal: Risk for impaired skin integrity will decrease Outcome: Progressing   Problem: Education: Goal: Knowledge of disease or condition will improve Outcome: Progressing Goal: Knowledge of secondary prevention will improve (MUST DOCUMENT ALL) Outcome: Progressing Goal: Knowledge of patient specific risk factors will improve Loraine Leriche N/A or DELETE if not current risk factor) Outcome: Progressing   Problem: Ischemic Stroke/TIA Tissue Perfusion: Goal: Complications of ischemic stroke/TIA will be minimized Outcome: Progressing   Problem: Coping: Goal: Will verbalize positive feelings about self Outcome: Progressing Goal: Will identify appropriate support needs Outcome: Progressing   Problem: Health Behavior/Discharge Planning: Goal: Ability to manage health-related needs will improve Outcome: Progressing Goal: Goals will be collaboratively established with patient/family Outcome: Progressing   Problem: Self-Care: Goal: Ability to participate in self-care as condition permits will improve Outcome: Progressing Goal: Verbalization of feelings and concerns over difficulty with self-care will improve Outcome: Progressing Goal: Ability to communicate needs accurately will  improve Outcome: Progressing   Problem: Nutrition: Goal: Risk of aspiration will decrease Outcome: Progressing Goal: Dietary intake will improve Outcome: Progressing

## 2023-04-10 DIAGNOSIS — I639 Cerebral infarction, unspecified: Secondary | ICD-10-CM | POA: Diagnosis not present

## 2023-04-10 LAB — GLUCOSE, CAPILLARY
Glucose-Capillary: 171 mg/dL — ABNORMAL HIGH (ref 70–99)
Glucose-Capillary: 180 mg/dL — ABNORMAL HIGH (ref 70–99)
Glucose-Capillary: 170 mg/dL — ABNORMAL HIGH (ref 70–99)
Glucose-Capillary: 172 mg/dL — ABNORMAL HIGH (ref 70–99)

## 2023-04-10 MED ORDER — GLYCERIN (LAXATIVE) 2 G RE SUPP
1.0000 | Freq: Once | RECTAL | Status: DC
  Filled 2023-04-10: qty 1

## 2023-04-10 NOTE — Progress Notes (Signed)
RN spoke with patient's sister regarding recent hospitalization.  Patient will be discharged to a SNF, sister is attending a meeting tomorrow for set up.     RN will continue to follow and be in contact to ensure appropriate follow up's are placed once patient discharged.

## 2023-04-10 NOTE — Assessment & Plan Note (Signed)
TOC is working on placement to SNF, sister in agreement and patient gradually accepting.  Noted the patient has a history of severe untreated depression, remains tearful and with depressed affect, suspect significant contribution.

## 2023-04-10 NOTE — Assessment & Plan Note (Signed)
Does have prostate cancer and did receive salvage radiation treatment directed to the prostatic fossa and pelvic lymph nodes and combination of ADT starting in September.  No evidence of retention on bladder scans. -Strict I's and O's

## 2023-04-10 NOTE — Assessment & Plan Note (Signed)
LDL 106 this admission, goal <70. -Atorvastatin 80 mg for high intensity therapy

## 2023-04-10 NOTE — Plan of Care (Signed)
Problem: Education: Goal: Ability to describe self-care measures that may prevent or decrease complications (Diabetes Survival Skills Education) will improve 04/10/2023 0005 by Jerold Coombe, RN Outcome: Progressing 04/10/2023 0005 by Jerold Coombe, RN Outcome: Progressing Goal: Individualized Educational Video(s) 04/10/2023 0005 by Jerold Coombe, RN Outcome: Progressing 04/10/2023 0005 by Jerold Coombe, RN Outcome: Progressing   Problem: Coping: Goal: Ability to adjust to condition or change in health will improve 04/10/2023 0005 by Jerold Coombe, RN Outcome: Progressing 04/10/2023 0005 by Jerold Coombe, RN Outcome: Progressing   Problem: Fluid Volume: Goal: Ability to maintain a balanced intake and output will improve 04/10/2023 0005 by Jerold Coombe, RN Outcome: Progressing 04/10/2023 0005 by Jerold Coombe, RN Outcome: Progressing   Problem: Health Behavior/Discharge Planning: Goal: Ability to identify and utilize available resources and services will improve 04/10/2023 0005 by Jerold Coombe, RN Outcome: Progressing 04/10/2023 0005 by Jerold Coombe, RN Outcome: Progressing Goal: Ability to manage health-related needs will improve 04/10/2023 0005 by Jerold Coombe, RN Outcome: Progressing 04/10/2023 0005 by Jerold Coombe, RN Outcome: Progressing   Problem: Metabolic: Goal: Ability to maintain appropriate glucose levels will improve 04/10/2023 0005 by Jerold Coombe, RN Outcome: Progressing 04/10/2023 0005 by Jerold Coombe, RN Outcome: Progressing   Problem: Nutritional: Goal: Maintenance of adequate nutrition will improve 04/10/2023 0005 by Jerold Coombe, RN Outcome: Progressing 04/10/2023 0005 by Jerold Coombe, RN Outcome: Progressing Goal: Progress toward achieving an optimal weight will improve 04/10/2023 0005 by Jerold Coombe, RN Outcome: Progressing 04/10/2023 0005 by Jerold Coombe, RN Outcome: Progressing   Problem: Skin  Integrity: Goal: Risk for impaired skin integrity will decrease 04/10/2023 0005 by Jerold Coombe, RN Outcome: Progressing 04/10/2023 0005 by Jerold Coombe, RN Outcome: Progressing   Problem: Tissue Perfusion: Goal: Adequacy of tissue perfusion will improve 04/10/2023 0005 by Jerold Coombe, RN Outcome: Progressing 04/10/2023 0005 by Jerold Coombe, RN Outcome: Progressing   Problem: Education: Goal: Knowledge of General Education information will improve Description: Including pain rating scale, medication(s)/side effects and non-pharmacologic comfort measures 04/10/2023 0005 by Jerold Coombe, RN Outcome: Progressing 04/10/2023 0005 by Jerold Coombe, RN Outcome: Progressing   Problem: Health Behavior/Discharge Planning: Goal: Ability to manage health-related needs will improve 04/10/2023 0005 by Jerold Coombe, RN Outcome: Progressing 04/10/2023 0005 by Jerold Coombe, RN Outcome: Progressing   Problem: Clinical Measurements: Goal: Ability to maintain clinical measurements within normal limits will improve 04/10/2023 0005 by Jerold Coombe, RN Outcome: Progressing 04/10/2023 0005 by Jerold Coombe, RN Outcome: Progressing Goal: Will remain free from infection 04/10/2023 0005 by Jerold Coombe, RN Outcome: Progressing 04/10/2023 0005 by Jerold Coombe, RN Outcome: Progressing Goal: Diagnostic test results will improve 04/10/2023 0005 by Jerold Coombe, RN Outcome: Progressing 04/10/2023 0005 by Jerold Coombe, RN Outcome: Progressing Goal: Respiratory complications will improve 04/10/2023 0005 by Jerold Coombe, RN Outcome: Progressing 04/10/2023 0005 by Jerold Coombe, RN Outcome: Progressing Goal: Cardiovascular complication will be avoided 04/10/2023 0005 by Jerold Coombe, RN Outcome: Progressing 04/10/2023 0005 by Jerold Coombe, RN Outcome: Progressing   Problem: Activity: Goal: Risk for activity intolerance will decrease 04/10/2023 0005 by Jerold Coombe, RN Outcome: Progressing 04/10/2023 0005 by Jerold Coombe, RN Outcome: Progressing   Problem: Nutrition: Goal: Adequate nutrition will be maintained 04/10/2023 0005 by Jerold Coombe, RN Outcome: Progressing 04/10/2023 0005 by Jerold Coombe, RN Outcome: Progressing   Problem: Coping: Goal: Level of anxiety will decrease 04/10/2023 0005 by Jerold Coombe, RN Outcome: Progressing 04/10/2023 0005 by Jerold Coombe, RN Outcome: Progressing   Problem: Elimination: Goal: Will not experience complications related to bowel motility 04/10/2023 0005  by Jerold Coombe, RN Outcome: Progressing 04/10/2023 0005 by Jerold Coombe, RN Outcome: Progressing Goal: Will not experience complications related to urinary retention 04/10/2023 0005 by Jerold Coombe, RN Outcome: Progressing 04/10/2023 0005 by Jerold Coombe, RN Outcome: Progressing   Problem: Pain Management: Goal: General experience of comfort will improve 04/10/2023 0005 by Jerold Coombe, RN Outcome: Progressing 04/10/2023 0005 by Jerold Coombe, RN Outcome: Progressing   Problem: Safety: Goal: Ability to remain free from injury will improve 04/10/2023 0005 by Jerold Coombe, RN Outcome: Progressing 04/10/2023 0005 by Jerold Coombe, RN Outcome: Progressing   Problem: Skin Integrity: Goal: Risk for impaired skin integrity will decrease 04/10/2023 0005 by Jerold Coombe, RN Outcome: Progressing 04/10/2023 0005 by Jerold Coombe, RN Outcome: Progressing   Problem: Education: Goal: Knowledge of disease or condition will improve 04/10/2023 0005 by Jerold Coombe, RN Outcome: Progressing 04/10/2023 0005 by Jerold Coombe, RN Outcome: Progressing Goal: Knowledge of secondary prevention will improve (MUST DOCUMENT ALL) 04/10/2023 0005 by Jerold Coombe, RN Outcome: Progressing 04/10/2023 0005 by Jerold Coombe, RN Outcome: Progressing Goal: Knowledge of patient specific risk factors will improve Loraine Leriche N/A or  DELETE if not current risk factor) 04/10/2023 0005 by Jerold Coombe, RN Outcome: Progressing 04/10/2023 0005 by Jerold Coombe, RN Outcome: Progressing   Problem: Ischemic Stroke/TIA Tissue Perfusion: Goal: Complications of ischemic stroke/TIA will be minimized 04/10/2023 0005 by Jerold Coombe, RN Outcome: Progressing 04/10/2023 0005 by Jerold Coombe, RN Outcome: Progressing   Problem: Coping: Goal: Will verbalize positive feelings about self 04/10/2023 0005 by Jerold Coombe, RN Outcome: Progressing 04/10/2023 0005 by Jerold Coombe, RN Outcome: Progressing Goal: Will identify appropriate support needs 04/10/2023 0005 by Jerold Coombe, RN Outcome: Progressing 04/10/2023 0005 by Jerold Coombe, RN Outcome: Progressing   Problem: Health Behavior/Discharge Planning: Goal: Ability to manage health-related needs will improve 04/10/2023 0005 by Jerold Coombe, RN Outcome: Progressing 04/10/2023 0005 by Jerold Coombe, RN Outcome: Progressing Goal: Goals will be collaboratively established with patient/family 04/10/2023 0005 by Jerold Coombe, RN Outcome: Progressing 04/10/2023 0005 by Jerold Coombe, RN Outcome: Progressing   Problem: Self-Care: Goal: Ability to participate in self-care as condition permits will improve 04/10/2023 0005 by Jerold Coombe, RN Outcome: Progressing 04/10/2023 0005 by Jerold Coombe, RN Outcome: Progressing Goal: Verbalization of feelings and concerns over difficulty with self-care will improve 04/10/2023 0005 by Jerold Coombe, RN Outcome: Progressing 04/10/2023 0005 by Jerold Coombe, RN Outcome: Progressing Goal: Ability to communicate needs accurately will improve 04/10/2023 0005 by Jerold Coombe, RN Outcome: Progressing 04/10/2023 0005 by Jerold Coombe, RN Outcome: Progressing   Problem: Nutrition: Goal: Risk of aspiration will decrease 04/10/2023 0005 by Jerold Coombe, RN Outcome: Progressing 04/10/2023 0005 by Jerold Coombe, RN Outcome: Progressing Goal: Dietary intake will improve 04/10/2023 0005 by Jerold Coombe, RN Outcome: Progressing 04/10/2023 0005 by Jerold Coombe, RN Outcome: Progressing

## 2023-04-10 NOTE — Assessment & Plan Note (Signed)
 A1c 6.7 this admission, goal <7.  Not on any medications outpatient.  CBGs stable. -Sensitive SSI -CBGs ACHS

## 2023-04-10 NOTE — Progress Notes (Signed)
Daily Progress Note Intern Pager: [phone]  Patient name: Jacob [name] Medical record number: [id number] Date of birth: 1956-03-29 Age: 67 y.o. Gender: male  Primary Care Provider: [name], DO Consultants: Neurology Code Status: Full  Pt Overview and Major Events to Date:  12/3 - Admitted, multiple infarctions on MRI 12/4 - TOC consulted for SNF placement, but difficult discharge and working with sister   Assessment and Plan: Jacob [name] is a 68 y.o. male with a pertinent PMH of HLD, HTN, T2DM, OSA, MDD, prostate cancer status post salvage radiation treatment who presented with slurred speech and R side weakness, admitted for CVA and now awaiting placement for SNF.  Patient endorses feeling very depressed, but does deny SI, believe that this is appropriate for outpatient follow-up.  Does complain of some pelvic pain, urinary retention this morning.  Postvoid residual is normal on bladder scan.  Has not had a bowel movement some days, suppository ordered.  Stable for discharge to SNF once insurance authorization is successful, pending TOC follow-up.  Planning for discharge today if possible.  Assessment & Plan CVA (cerebral vascular accident) (HCC) MRI brain showing acute infarction left basal ganglia/corona radiata, left posterior temporal cortex, posterior left pons without hemorrhage.  Grossly neurologically intact on exam. Echo unremarkable, less concerning for cardioembolic etiology but will need loop recorder outpatient. -SNF placement, work with PT/OT as needed  -Optimize risk factors as below -Neuro stroke team following, follow up recs:  -S/p 48-hour window for permissive HTN  -Clopidogrel 75 mg/ASA 81 mg for 3 weeks, followed by aspirin alone -Loop recorder outpatient T2DM (type 2 diabetes mellitus) (HCC) A1c 6.7 this admission, goal <7.  Not on any medications outpatient.  CBGs stable. -Sensitive SSI -CBGs ACHS HYPERLIPIDEMIA LDL 106 this admission, goal  <70. -Atorvastatin 80 mg for high intensity therapy Dementia (HCC) TOC is working on placement to SNF, sister in agreement and patient gradually accepting.  Noted the patient has a history of severe untreated depression, remains tearful and with depressed affect, suspect significant contribution. Dysuria Does have prostate cancer and did receive salvage radiation treatment directed to the prostatic fossa and pelvic lymph nodes and combination of ADT starting in September.  No evidence of retention on bladder scans. -Strict I's and O's Abdominal pain None reported this AM, sleeping well prior to awakening  Chronic and Stable Problems: Anxiety/Depression: Restart venlafaxine XR 37.5 mg daily HLD: Start atorvastatin 80 mg daily as above  FEN/GI: Carb modified diet PPx: Enoxaparin Dispo: SNF  pending insurance Auth .  Subjective:  This morning, patient complains of some pelvic pain, feels like it is difficult to urinate.  Also states he has not had a bowel movement in a few days.  Overall is uncomfortable and unhappy.  Becomes tearful during conversation and agrees that he is very depressed.  Objective: Temp:  [97.5 F (36.4 C)-98.9 F (37.2 C)] 98.7 F (37.1 C) (12/09 1125) Pulse Rate:  [74-93] 86 (12/09 1125) Resp:  [17-18] 18 (12/09 1125) BP: (112-152)/(65-98) 130/87 (12/09 1125) SpO2:  [93 %-100 %] 94 % (12/09 1125)  Physical Exam: General: Adult male, resting in bed, no acute distress.  Alert and at baseline. Eyes: No scleral icterus. ENTM: MMM. Neck: No JVD. Cardiovascular: RRR.  Normal S1/S2.  No murmur/rub/gallops. Respiratory: Normal work of breathing on room air. Gastrointestinal: Nontender to palpation. MSK: Moving all extremities appropriately.  Equal strength bilaterally. Extremities: No peripheral edema bilaterally.  Capillary refill less than seconds. Derm: No rashes grossly.  Neuro: CNs II-XII intact.  No dysarthria, dysmetria.  Following instructions  appropriately.  Alert and oriented x 4. Psych: Depressed mood, quiet, intermittently tearful affect.  Denies SI/HI.  Laboratory: Most recent CBC Lab Results  Component Value Date   WBC 9.4 04/08/2023   HGB 13.3 04/08/2023   HCT 38.6 (L) 04/08/2023   MCV 82.0 04/08/2023   PLT 172 04/08/2023   Most recent BMP    Latest Ref Rng & Units 04/08/2023    1:09 PM  BMP  Glucose 70 - 99 mg/dL 161   BUN 8 - 23 mg/dL 15   Creatinine 0.96 - 1.24 mg/dL 0.45   Sodium 409 - 811 mmol/L 142   Potassium 3.5 - 5.1 mmol/L 3.8   Chloride 98 - 111 mmol/L 104   CO2 22 - 32 mmol/L 27   Calcium 8.9 - 10.3 mg/dL 9.2     Other pertinent labs: -None  New Imaging/Diagnostic Tests: -Bladder scan postvoid residual 120 cc  Daylene Vandenbosch Sharion Dove, MD 04/10/2023, 11:28 AM  PGY-1,  Family Medicine FPTS Intern pager: [phone], text pages welcome Secure chat group [facility] Teaching Service

## 2023-04-10 NOTE — Plan of Care (Signed)
  Problem: Education: Goal: Ability to describe self-care measures that may prevent or decrease complications (Diabetes Survival Skills Education) will improve Outcome: Progressing Goal: Individualized Educational Video(s) Outcome: Progressing   

## 2023-04-10 NOTE — Discharge Summary (Addendum)
Family Medicine Teaching [facility] Discharge Summary  Patient name: Jacob [name] Medical record number: [id number] Date of birth: 15-Feb-1956 Age: 67 y.o. Gender: male Date of Admission: 04/04/2023  Date of Discharge: 04/11/2023 Admitting Physician: Westley Chandler, MD  Primary Care Provider: [name], DO Consultants: Neurology  Indication for Hospitalization: CVA  Discharge Diagnoses/Problem List:  Principal Problem for Admission: CVA Other Problems addressed during stay:  Principal Problem:   Acute cerebral infarction [facility]) Active Problems:   HYPERLIPIDEMIA   CVA (cerebral vascular accident) (HCC)   Dysuria   Dementia (HCC)   T2DM (type 2 diabetes mellitus) (HCC)   Abdominal pain   Brief Hospital Course:  Jacob [name] is a 67 y.o.male with a history of prostate cancer, HLD, HTN, OSA, T2DM, GERD, gout, anxiety and depression who was admitted to the [facility] at [facility] for neurological deficits. His hospital course is detailed below:  Cerebrovascular attack Initially with slurred speech and right-sided weakness that resolved upon admission.  MR brain showed acute infarcts in the left basal ganglia/corona radiata, left posterior temporal cortex, and posterior left pons.  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  Neuro recommended clopidogrel and aspirin x 3 weeks and then aspirin 81 mg alone, clopidogrel scheduled to complete 12/26.  Echo normal, but recommended 30-day loop recorder to evaluate for underlying A-fib, will need to be set up outpatient.  Dysuria In the setting of active prostate cancer with recent salvage radiation.  UA negative, voiding spontaneously throughout admission with appropriate bladder scans.  Repeat bladder scan obtained on day of discharge with normal postvoid residual.  Cognitive decline Per patient's sister, patient showing signs of cognitive decline.  Discussed this  at length with sister and patient who are eventually agreeable to SNF.  Patient does have underlying history of depression, frequently becomes tearful and has a very depressed affect.  Suspect severe undertreated depression contributing to cognitive decline, was continued on venlafaxine XR 37.5 mg daily.  Other chronic conditions were medically managed with home medications and formulary alternatives as necessary (anxiety, depression, HLD)  PCP Follow-up Recommendations: Consider reversible causes workup for cognitive decline, particular depression 30 Day Loop recorder needed per neurology rec, please order Evaluate for elderly depression, suspect significant undiagnosed depression component Optimized T2DM and statin therapy   Disposition: SNF  Discharge Condition: Stable, no focal neurological deficit  Discharge Exam:  Vitals:   04/11/23 0757 04/11/23 1123  BP: (!) 140/75 (!) 156/88  Pulse: 75 93  Resp: 17 18  Temp: 97.9 F (36.6 C) 98.5 F (36.9 C)  SpO2: 98% 100%   Physical Exam performed by Dr. Sharion Dove: General: Adult male, resting in bed, no acute distress.  Appears disheveled and unkempt.  Alert. Eyes: No scleral icterus.  No conjunctival erythema. ENTM: MMM. Neck: No JVD. Cardiovascular: RRR.  Normal S1/S2.  No murmur/rub/gallops. Respiratory: Normal work of breathing on room air. Gastrointestinal: Nontender to palpation. MSK: Moving all extremities appropriately.  Equal strength bilaterally. Extremities: No peripheral edema bilaterally.  Capillary refill less than seconds. Derm: No rashes grossly. Neuro: CNs II-XII intact.  No dysarthria, dysmetria.  Following instructions appropriately.  Alert and oriented x 4. Psych: Depressed mood, quiet, intermittently tearful affect.  Disheveled, smelling of urine. Denies SI/HI.  Significant Procedures: None  Significant Labs and Imaging:  No results for input(s): "WBC", "HGB", "HCT", "PLT" in the last 48 hours.  No results  for input(s): "NA", "K", "  CL", "CO2", "GLUCOSE", "BUN", "CREATININE", "CALCIUM", "MG", "PHOS", "ALKPHOS", "AST", "ALT", "ALBUMIN", "PROTEIN" in the last 48 hours.   -MRI brain: MRI brain showing acute infarction left basal ganglia/corona radiata, left posterior temporal cortex, posterior left pons without hemorrhage. 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  No regional wall motion abnormalities, no thrombi.  WNL.  -EEG: Normal  Results/Tests Pending at Time of Discharge: None  Discharge Medications:  Allergies as of 04/11/2023       Reactions   Methylprednisolone Sodium Succ    Respiratory failure Pt states he has never had respiratory failure, does not recall this reaction   Ms Contin [morphine] Nausea Only        Medication List     STOP taking these medications    amoxicillin 500 MG capsule Commonly known as: AMOXIL   ibuprofen 800 MG tablet Commonly known as: ADVIL       TAKE these medications    amLODipine 10 MG tablet Commonly known as: NORVASC Take 1 tablet (10 mg total) by mouth daily.   aspirin 81 MG chewable tablet Chew 1 tablet (81 mg total) by mouth daily.   atorvastatin 80 MG tablet Commonly known as: LIPITOR Take 1 tablet (80 mg total) by mouth daily.   clopidogrel 75 MG tablet Commonly known as: PLAVIX Take 1 tablet (75 mg total) by mouth daily for 20 days.   venlafaxine XR 37.5 MG 24 hr capsule Commonly known as: EFFEXOR-XR TAKE 1 CAPSULE BY MOUTH DAILY WITH BREAKFAST.        Discharge Instructions: Please refer to Patient Instructions section of EMR for full details.  Patient was counseled important signs and symptoms that should prompt return to medical care, changes in medications, dietary instructions, activity restrictions, and follow  up appointments.    Sharion Dove, Dimitry, MD 04/11/2023, 10:40 AM PGY-1, [facility]  Upper Level Addendum:   I have seen and evaluated this patient along with Dr. Sharion Dove and reviewed the above note, making necessary revisions as appropriate.  I agree with the medical decision making and physical exam as noted above.   Elberta Fortis, DO PGY-2, [facility]

## 2023-04-10 NOTE — TOC Progression Note (Addendum)
Transition of Care [facility]) - Progression Note    Patient Details  Name: DIAMONTAE AMIGON MRN: [id number] Date of Birth: 1955/12/04  Transition [facility] Care [facility]) CM/SW Contact  Baldemar Lenis, [place] Phone Number: 04/10/2023, 10:24 AM  Clinical Narrative:   CSW following for disposition. Patient's insurance authorization request remains pending today, no updates at this time. CSW to follow.  UPDATE: Authorization request is still pending. CSW discussed with CMA, called to ask why there were no updates, and [employer] has not been able to review request yet. CSW updated MD.    Expected Discharge Plan: Skilled Nursing Facility Barriers to Discharge: Continued Medical Work up, English as a second language teacher  Expected Discharge Plan and Services     Post Acute Care Choice: NA Living arrangements for the past 2 months: Apartment                                       Social Determinants of Health (SDOH) Interventions SDOH Screenings   Food Insecurity: No Food Insecurity (04/04/2023)  Housing: Low Risk  (04/04/2023)  Transportation Needs: No Transportation Needs (04/04/2023)  Utilities: Not At Risk (04/04/2023)  Alcohol Screen: Low Risk  (10/04/2018)  Depression (PHQ2-9): High Risk (02/27/2023)  Financial Resource Strain: High Risk (11/16/2022)  Stress: Stress Concern Present (04/16/2019)  Tobacco Use: Low Risk  (04/04/2023)    Readmission Risk Interventions     No data to display

## 2023-04-10 NOTE — Assessment & Plan Note (Signed)
MRI brain showing acute infarction left basal ganglia/corona radiata, left posterior temporal cortex, posterior left pons without hemorrhage.  Grossly neurologically intact on exam. Echo unremarkable, less concerning for cardioembolic etiology but will need loop recorder outpatient. -SNF placement, work with PT/OT as needed  -Optimize risk factors as below -Neuro stroke team following, follow up recs:  -S/p 48-hour window for permissive HTN  -Clopidogrel 75 mg/ASA 81 mg for 3 weeks, followed by aspirin alone -Loop recorder outpatient

## 2023-04-10 NOTE — Assessment & Plan Note (Signed)
None reported this AM, sleeping well prior to awakening

## 2023-04-10 NOTE — Progress Notes (Signed)
Physical Therapy Treatment Patient Details Name: Jacob [name] MRN: [id number] DOB: 1956-04-06 Today's Date: 04/10/2023   History of Present Illness 67 y.o. male presenting with slurred speech and right sided weakness. 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 PMH of HLD, HTN, anxiety, DM II, gout, hx of prostate cancer.    PT Comments  Pt greeted seated up EOB and agreeable to session with focus on high level balance and dual tasking. Pt with noted slowing of gait speed with cognitive dual tasking with recall and staggering steps and path deviation with environmental scanning. Pt requiring min A to maintain balance with retro stepping and side stepping with pt demonstrating poor sequencing and weight shifting during side stepping. Pt continues to be limited by poor balance/postural reactions, decreased activity tolerance and impaired cognition and current plan remains appropriate to address deficits and maximize functional independence and decrease caregiver burden.    If plan is discharge home, recommend the following: A little help with walking and/or transfers;A little help with bathing/dressing/bathroom;Supervision due to cognitive status;Assistance with cooking/housework;Direct supervision/assist for medications management;Assist for transportation;Help with stairs or ramp for entrance   Can travel by private vehicle     Yes  Equipment Recommendations  Other (comment) (Per accepting facility)    Recommendations for Other Services       Precautions / Restrictions Precautions Precautions: Fall Restrictions Weight Bearing Restrictions: No     Mobility  Bed Mobility Overal bed mobility: Needs Assistance             General bed mobility comments: pt seated up EOB on arrival    Transfers Overall transfer level: Needs assistance Equipment used: None Transfers: Sit to/from Stand Sit to  Stand: Contact guard assist           General transfer comment: CGA for safety, steady rise without LOB    Ambulation/Gait Ambulation/Gait assistance: Contact guard assist, Min assist Gait Distance (Feet): 200 Feet Assistive device: None Gait Pattern/deviations: Drifts right/left, Decreased stride length, Step-through pattern Gait velocity: decreased     General Gait Details: pt with slowing for dual tasking, some staggering steps with head turns   Optometrist     Tilt Bed    Modified Rankin (Stroke Patients Only)       Balance Overall balance assessment: Needs assistance Sitting-balance support: Feet supported, Bilateral upper extremity supported Sitting balance-Leahy Scale: Good     Standing balance support: During functional activity, No upper extremity supported Standing balance-Leahy Scale: Fair Standing balance comment: Min A to maintain dynamic balance during gait             High level balance activites: Side stepping, Backward walking, Direction changes, Turns, Head turns High Level Balance Comments: min A to maintain balance during retro stepping and tandem walking, pt needing increased time and cues for sequrncing with side stepping with pt demonstrating poor weight shift, improved with task practice            Cognition Arousal: Alert Behavior During Therapy: WFL for tasks assessed/performed, Impulsive Overall Cognitive Status: No family/caregiver present to determine baseline cognitive functioning                                          Exercises  General Comments General comments (skin integrity, edema, etc.): VSS      Pertinent Vitals/Pain Pain Assessment Pain Assessment: Faces Faces Pain Scale: Hurts little more Pain Location: abdomen Pain Descriptors / Indicators: Sore Pain Intervention(s): Premedicated before session, Monitored during session, Limited activity within  patient's tolerance    Home Living                          Prior Function            PT Goals (current goals can now be found in the care plan section) Acute Rehab PT Goals Patient Stated Goal: to go home PT Goal Formulation: With patient Time For Goal Achievement: 04/19/23 Progress towards PT goals: Progressing toward goals    Frequency    Min 1X/week      PT Plan      Co-evaluation              AM-PAC PT "6 Clicks" Mobility   Outcome Measure  Help needed turning from your back to your side while in a flat bed without using bedrails?: A Little Help needed moving from lying on your back to sitting on the side of a flat bed without using bedrails?: A Little Help needed moving to and from a bed to a chair (including a wheelchair)?: A Little Help needed standing up from a chair using your arms (e.g., wheelchair or bedside chair)?: A Little Help needed to walk in hospital room?: A Lot Help needed climbing 3-5 steps with a railing? : A Lot 6 Click Score: 16    End of Session Equipment Utilized During Treatment: Gait belt Activity Tolerance: Patient tolerated treatment well Patient left: in bed;with call bell/phone within reach;Other (comment) (seated up EOB) Nurse Communication: Mobility status PT Visit Diagnosis: Other abnormalities of gait and mobility (R26.89)     Time: 4696-2952 PT Time Calculation (min) (ACUTE ONLY): 15 min  Charges:    $Gait Training: 8-22 mins PT General Charges $$ ACUTE PT VISIT: 1 Visit                     [name] R. PTA Acute Rehabilitation Services Office: [phone]   Catalina Antigua 04/10/2023, 4:07 PM

## 2023-04-11 DIAGNOSIS — I639 Cerebral infarction, unspecified: Secondary | ICD-10-CM | POA: Diagnosis not present

## 2023-04-11 LAB — GLUCOSE, CAPILLARY
Glucose-Capillary: 175 mg/dL — ABNORMAL HIGH (ref 70–99)
Glucose-Capillary: 159 mg/dL — ABNORMAL HIGH (ref 70–99)

## 2023-04-11 NOTE — Progress Notes (Addendum)
Pt discharged at this time via [facility].  Has all belongings with him including shoes, clothing, and cell phone.    Report called to Tamika at facility.

## 2023-04-11 NOTE — Plan of Care (Signed)

## 2023-04-11 NOTE — TOC Transition Note (Signed)
Transition of Care [facility]) - CM/SW Discharge Note   Patient Details  Name: Jacob [name] MRN: [id number] Date of Birth: 1955-07-18  Transition of Care [facility]) CM/SW Contact:  Baldemar Lenis, LCSW Phone Number: 04/11/2023, 10:59 AM   Clinical Narrative:  Patient received insurance approval to admit to SNF, CSW updated [facility] and they are ready to accept. Sister, [name], updated and in agreement. Transport arranged with [facility] for next available.  Nurse to call report to [phone], Room 34B.    Final next level of care: Skilled Nursing Facility Barriers to Discharge: Barriers Resolved   Patient Goals and CMS Choice CMS Medicare.gov Compare Post Acute Care list provided to:: Patient Represenative (must comment) Choice offered to / list presented to : Sibling  Discharge Placement                Patient chooses bed at: [facility] Patient to be transferred to facility by: [facility] Name of family member notified: [name] Patient and family notified of of transfer: 04/11/23  Discharge Plan and Services Additional resources added to the After Visit Summary for       Post Acute Care Choice: NA                               Social Determinants of Health (SDOH) Interventions SDOH Screenings   Food Insecurity: No Food Insecurity (04/04/2023)  Housing: Low Risk  (04/04/2023)  Transportation Needs: No Transportation Needs (04/04/2023)  Utilities: Not At Risk (04/04/2023)  Alcohol Screen: Low Risk  (10/04/2018)  Depression (PHQ2-9): High Risk (02/27/2023)  Financial Resource Strain: High Risk (11/16/2022)  Stress: Stress Concern Present (04/16/2019)  Tobacco Use: Low Risk  (04/04/2023)     Readmission Risk Interventions     No data to display

## 2023-04-11 NOTE — Care Management Important Message (Signed)
Important Message  Patient Details  Name: LYNDOL ROMUALDO MRN: [id number] Date of Birth: 1955/09/01   Important Message Given:  Yes - Medicare IM  Patient discharge to home prior to IM delivery will send a copy to the patient home address.   Kenyanna Grzesiak 04/11/2023, 2:38 PM

## 2023-04-13 DIAGNOSIS — I639 Cerebral infarction, unspecified: Secondary | ICD-10-CM | POA: Diagnosis not present

## 2023-04-13 DIAGNOSIS — E119 Type 2 diabetes mellitus without complications: Secondary | ICD-10-CM | POA: Diagnosis not present

## 2023-04-14 ENCOUNTER — Telehealth: Payer: Self-pay

## 2023-04-14 NOTE — Telephone Encounter (Signed)
Opened in error

## 2023-04-14 NOTE — Telephone Encounter (Signed)
Patient's sister calls nurse line requesting to speak with Dr. Rexene Alberts.   She reports that patient is currently at [facility] nursing home and would like to speak with Dr. Rexene Alberts regarding possible dementia diagnosis.   Advised that Dr. Claudean Severance is the PCP, however, sister reports that Dr. Rexene Alberts helped them a lot at last visit and would like to speak with her.   Requesting returned call at [phone].  Veronda Prude, RN

## 2023-04-14 NOTE — Progress Notes (Signed)
Patient is now residing at [facility] after recent hospital admission.  RN spoke with patient's sister to inform her that I would assist in getting patient coordinated for follow up with [facility] to ensure we get a follow up and ADT rescheduled.  MD aware, will work to get patient scheduled.  Will continue to follow.

## 2023-04-17 DIAGNOSIS — E119 Type 2 diabetes mellitus without complications: Secondary | ICD-10-CM | POA: Diagnosis not present

## 2023-04-17 DIAGNOSIS — I639 Cerebral infarction, unspecified: Secondary | ICD-10-CM | POA: Diagnosis not present

## 2023-04-17 DIAGNOSIS — E785 Hyperlipidemia, unspecified: Secondary | ICD-10-CM | POA: Diagnosis not present

## 2023-04-18 ENCOUNTER — Other Ambulatory Visit: Payer: Self-pay | Admitting: Family Medicine

## 2023-04-18 DIAGNOSIS — R4189 Other symptoms and signs involving cognitive functions and awareness: Secondary | ICD-10-CM

## 2023-04-18 NOTE — Progress Notes (Signed)
Patient is scheduled for ADT and to see Dr. Laverle Patter on 12/23.  RN spoke with sister she is aware of appointment and will plan to take patient.  Patient still currently residing at [facility] for rehab.

## 2023-04-19 ENCOUNTER — Telehealth: Payer: Self-pay | Admitting: Family Medicine

## 2023-04-19 NOTE — Telephone Encounter (Signed)
Spoke with patient's sisters on the phone.  They are very concerned about him leaving rehab and going home.  States that he will die if he goes back home to live independently.  He cannot take care of himself.  Asking if there is anything we can do to assist him with this.  I have placed a [facility] referral and encouraged them to reach out to the social worker at the current rehab [facility] to see if they can offer any assistance.  Messaged Mervin Hack who will see that their team will out reach family.

## 2023-04-20 ENCOUNTER — Ambulatory Visit: Payer: Self-pay | Admitting: Licensed Clinical Social Worker

## 2023-04-20 NOTE — Patient Instructions (Signed)
Visit Information  Thank you for taking time to visit with me today. Please don't hesitate to contact me if I can be of assistance to you.   Following are the goals we discussed today:   Goals Addressed             This Visit's Progress    Care Coordination       Activities and task to complete in order to accomplish goals.     Call to inquire about the PACE program [phone]  [facility] location) In-Home Aide program at [facility] of Social Service-call to be placed on wait list [phone] Complete Advance Directive packet,  Have advance directive notarized and provide a copy to provider office  I have placed a referral with [employer], you understand this is a long wait list, they will reach out to you. Feel free to follow up with them at [phone]  Create caregiver plan with Mr. Yohey  Updated 04/20/23----encourage sister S. Mosley to apply for long term medicaid with [facility] DSS . [name] reports pt is permanently in [facility] skilled nursing facility [place]         Please call the care guide team at [phone] if you need to cancel or reschedule your appointment.   If you are experiencing a Mental Health or Behavioral Health Crisis or need someone to talk to, please call 911   Patient verbalizes understanding of instructions and care plan provided today and agrees to view in MyChart. Active MyChart status and patient understanding of how to access instructions and care plan via MyChart confirmed with patient.     The patient has been provided with contact information for the care management team and has been advised to call with any health related questions or concerns.   Gwyndolyn Saxon MSW, LCSW Licensed Clinical Social Worker  [facility], Population Health Direct Dial: [phone]  Fax: [phone]

## 2023-04-20 NOTE — Patient Outreach (Signed)
  Care Coordination   Follow Up Visit Note   04/20/2023 Name: DAYTONA TRAM MRN: [id number] DOB: 19-Mar-1956  Guss Bunde is a 67 y.o. year old male who sees [name], [place] for primary care. I spoke with  Guss Bunde sister S. Mosley by phone today.  What matters to the patients health and wellness today?  [name] provided information to apply for long term medicaid for Mr. Samarripa. Pt is placed in [facility]. [name] reports there is no Child psychotherapist on staff at [facility] [place] nursing facility.    Goals Addressed             This Visit's Progress    Care Coordination       Activities and task to complete in order to accomplish goals.     Call to inquire about the PACE program [phone]  [facility] location) In-Home Aide program at Department of Social Service-call to be placed on wait list [phone] Complete Advance Directive packet,  Have advance directive notarized and provide a copy to provider office  I have placed a referral with [employer], you understand this is a long wait list, they will reach out to you. Feel free to follow up with them at [phone]  Create caregiver plan with Mr. Doll  Updated 04/20/23----encourage sister S. Mosley to apply for long term medicaid with [facility] DSS . Ms. Mosley reports pt is permanently in [facility] [place] skilled nursing facility in [place]         SDOH assessments and interventions completed:  No     Care Coordination Interventions:  Yes, provided   Interventions Today    Flowsheet Row Most Recent Value  Chronic Disease   Chronic disease during today's visit Other  [Cognitive decline and stroke]  General Interventions   General Interventions Discussed/Reviewed [employer], Level of Care  Level of Care Skilled Nursing Facility  [Pt sis advised Mr. Demarest is permanently placed at [facility] skilled nursing facility]  Education  Interventions   Education Provided Provided Education  Provided Verbal Education On [employer], [employer] pt sister to apply for long term medicaid. LCSW A. Felton Clinton provide [facility] DSS Office [address] Albin F[place] [phone]. Pt is currently in a skilled nursing facility in [facility]]        Follow up plan: No further intervention required.   Encounter Outcome:  Patient Visit Completed    Gwyndolyn Saxon MSW, LCSW Licensed Clinical Social Worker  [facility], Population Health Direct Dial: [phone]  Fax: [phone]

## 2023-04-24 DIAGNOSIS — E785 Hyperlipidemia, unspecified: Secondary | ICD-10-CM | POA: Diagnosis not present

## 2023-04-24 DIAGNOSIS — E119 Type 2 diabetes mellitus without complications: Secondary | ICD-10-CM | POA: Diagnosis not present

## 2023-04-24 DIAGNOSIS — G8929 Other chronic pain: Secondary | ICD-10-CM | POA: Diagnosis not present

## 2023-04-25 NOTE — Progress Notes (Signed)
Patient saw Dr. Laverle Patter on 12/23 and received Eligard.  Per MD recommendations we will hold on salvage radiation to allow time for recovery from recent stroke.  Patient will see MD in 3 months for re-evaluation.  Will continue to follow.

## 2023-04-27 DIAGNOSIS — I10 Essential (primary) hypertension: Secondary | ICD-10-CM | POA: Diagnosis not present

## 2023-04-28 DIAGNOSIS — N390 Urinary tract infection, site not specified: Secondary | ICD-10-CM | POA: Diagnosis not present

## 2023-05-01 DIAGNOSIS — I639 Cerebral infarction, unspecified: Secondary | ICD-10-CM | POA: Diagnosis not present

## 2023-05-01 DIAGNOSIS — I10 Essential (primary) hypertension: Secondary | ICD-10-CM | POA: Diagnosis not present

## 2023-05-01 DIAGNOSIS — E785 Hyperlipidemia, unspecified: Secondary | ICD-10-CM | POA: Diagnosis not present

## 2023-05-08 DIAGNOSIS — I639 Cerebral infarction, unspecified: Secondary | ICD-10-CM | POA: Diagnosis not present

## 2023-05-08 DIAGNOSIS — K219 Gastro-esophageal reflux disease without esophagitis: Secondary | ICD-10-CM | POA: Diagnosis not present

## 2023-05-08 DIAGNOSIS — R0981 Nasal congestion: Secondary | ICD-10-CM | POA: Diagnosis not present

## 2023-05-08 DIAGNOSIS — R0989 Other specified symptoms and signs involving the circulatory and respiratory systems: Secondary | ICD-10-CM | POA: Diagnosis not present

## 2023-05-08 DIAGNOSIS — Z9181 History of falling: Secondary | ICD-10-CM | POA: Diagnosis not present

## 2023-05-08 DIAGNOSIS — E119 Type 2 diabetes mellitus without complications: Secondary | ICD-10-CM | POA: Diagnosis not present

## 2023-05-08 DIAGNOSIS — Z7982 Long term (current) use of aspirin: Secondary | ICD-10-CM | POA: Diagnosis not present

## 2023-05-08 DIAGNOSIS — Z7902 Long term (current) use of antithrombotics/antiplatelets: Secondary | ICD-10-CM | POA: Diagnosis not present

## 2023-05-08 DIAGNOSIS — I10 Essential (primary) hypertension: Secondary | ICD-10-CM | POA: Diagnosis not present

## 2023-05-08 DIAGNOSIS — R058 Other specified cough: Secondary | ICD-10-CM | POA: Diagnosis not present

## 2023-05-08 DIAGNOSIS — J029 Acute pharyngitis, unspecified: Secondary | ICD-10-CM | POA: Diagnosis not present

## 2023-05-08 DIAGNOSIS — Z8673 Personal history of transient ischemic attack (TIA), and cerebral infarction without residual deficits: Secondary | ICD-10-CM | POA: Diagnosis not present

## 2023-05-08 DIAGNOSIS — G8929 Other chronic pain: Secondary | ICD-10-CM | POA: Diagnosis not present

## 2023-05-08 DIAGNOSIS — E785 Hyperlipidemia, unspecified: Secondary | ICD-10-CM | POA: Diagnosis not present

## 2023-05-08 DIAGNOSIS — Z9049 Acquired absence of other specified parts of digestive tract: Secondary | ICD-10-CM | POA: Diagnosis not present

## 2023-05-08 DIAGNOSIS — G4733 Obstructive sleep apnea (adult) (pediatric): Secondary | ICD-10-CM | POA: Diagnosis not present

## 2023-05-08 DIAGNOSIS — M109 Gout, unspecified: Secondary | ICD-10-CM | POA: Diagnosis not present

## 2023-05-08 DIAGNOSIS — R3911 Hesitancy of micturition: Secondary | ICD-10-CM | POA: Diagnosis not present

## 2023-05-10 DIAGNOSIS — Z9049 Acquired absence of other specified parts of digestive tract: Secondary | ICD-10-CM | POA: Diagnosis not present

## 2023-05-10 DIAGNOSIS — Z7982 Long term (current) use of aspirin: Secondary | ICD-10-CM | POA: Diagnosis not present

## 2023-05-10 DIAGNOSIS — Z9181 History of falling: Secondary | ICD-10-CM | POA: Diagnosis not present

## 2023-05-10 DIAGNOSIS — R058 Other specified cough: Secondary | ICD-10-CM | POA: Diagnosis not present

## 2023-05-10 DIAGNOSIS — I10 Essential (primary) hypertension: Secondary | ICD-10-CM | POA: Diagnosis not present

## 2023-05-10 DIAGNOSIS — R0989 Other specified symptoms and signs involving the circulatory and respiratory systems: Secondary | ICD-10-CM | POA: Diagnosis not present

## 2023-05-10 DIAGNOSIS — J029 Acute pharyngitis, unspecified: Secondary | ICD-10-CM | POA: Diagnosis not present

## 2023-05-10 DIAGNOSIS — E119 Type 2 diabetes mellitus without complications: Secondary | ICD-10-CM | POA: Diagnosis not present

## 2023-05-10 DIAGNOSIS — G4733 Obstructive sleep apnea (adult) (pediatric): Secondary | ICD-10-CM | POA: Diagnosis not present

## 2023-05-10 DIAGNOSIS — R0981 Nasal congestion: Secondary | ICD-10-CM | POA: Diagnosis not present

## 2023-05-10 DIAGNOSIS — Z8673 Personal history of transient ischemic attack (TIA), and cerebral infarction without residual deficits: Secondary | ICD-10-CM | POA: Diagnosis not present

## 2023-05-10 DIAGNOSIS — R3911 Hesitancy of micturition: Secondary | ICD-10-CM | POA: Diagnosis not present

## 2023-05-10 DIAGNOSIS — Z7902 Long term (current) use of antithrombotics/antiplatelets: Secondary | ICD-10-CM | POA: Diagnosis not present

## 2023-05-10 DIAGNOSIS — K219 Gastro-esophageal reflux disease without esophagitis: Secondary | ICD-10-CM | POA: Diagnosis not present

## 2023-05-10 DIAGNOSIS — E785 Hyperlipidemia, unspecified: Secondary | ICD-10-CM | POA: Diagnosis not present

## 2023-05-10 DIAGNOSIS — G8929 Other chronic pain: Secondary | ICD-10-CM | POA: Diagnosis not present

## 2023-05-10 DIAGNOSIS — M109 Gout, unspecified: Secondary | ICD-10-CM | POA: Diagnosis not present

## 2023-05-12 DIAGNOSIS — N390 Urinary tract infection, site not specified: Secondary | ICD-10-CM | POA: Diagnosis not present

## 2023-05-15 DIAGNOSIS — Z9049 Acquired absence of other specified parts of digestive tract: Secondary | ICD-10-CM | POA: Diagnosis not present

## 2023-05-15 DIAGNOSIS — G4733 Obstructive sleep apnea (adult) (pediatric): Secondary | ICD-10-CM | POA: Diagnosis not present

## 2023-05-15 DIAGNOSIS — R0989 Other specified symptoms and signs involving the circulatory and respiratory systems: Secondary | ICD-10-CM | POA: Diagnosis not present

## 2023-05-15 DIAGNOSIS — R0981 Nasal congestion: Secondary | ICD-10-CM | POA: Diagnosis not present

## 2023-05-15 DIAGNOSIS — Z8673 Personal history of transient ischemic attack (TIA), and cerebral infarction without residual deficits: Secondary | ICD-10-CM | POA: Diagnosis not present

## 2023-05-15 DIAGNOSIS — G8929 Other chronic pain: Secondary | ICD-10-CM | POA: Diagnosis not present

## 2023-05-15 DIAGNOSIS — R3911 Hesitancy of micturition: Secondary | ICD-10-CM | POA: Diagnosis not present

## 2023-05-15 DIAGNOSIS — Z9181 History of falling: Secondary | ICD-10-CM | POA: Diagnosis not present

## 2023-05-15 DIAGNOSIS — I10 Essential (primary) hypertension: Secondary | ICD-10-CM | POA: Diagnosis not present

## 2023-05-15 DIAGNOSIS — R058 Other specified cough: Secondary | ICD-10-CM | POA: Diagnosis not present

## 2023-05-15 DIAGNOSIS — J029 Acute pharyngitis, unspecified: Secondary | ICD-10-CM | POA: Diagnosis not present

## 2023-05-15 DIAGNOSIS — E785 Hyperlipidemia, unspecified: Secondary | ICD-10-CM | POA: Diagnosis not present

## 2023-05-15 DIAGNOSIS — Z7982 Long term (current) use of aspirin: Secondary | ICD-10-CM | POA: Diagnosis not present

## 2023-05-15 DIAGNOSIS — E119 Type 2 diabetes mellitus without complications: Secondary | ICD-10-CM | POA: Diagnosis not present

## 2023-05-15 DIAGNOSIS — M109 Gout, unspecified: Secondary | ICD-10-CM | POA: Diagnosis not present

## 2023-05-15 DIAGNOSIS — K219 Gastro-esophageal reflux disease without esophagitis: Secondary | ICD-10-CM | POA: Diagnosis not present

## 2023-05-15 DIAGNOSIS — Z7902 Long term (current) use of antithrombotics/antiplatelets: Secondary | ICD-10-CM | POA: Diagnosis not present

## 2023-05-17 DIAGNOSIS — K219 Gastro-esophageal reflux disease without esophagitis: Secondary | ICD-10-CM | POA: Diagnosis not present

## 2023-05-17 DIAGNOSIS — J029 Acute pharyngitis, unspecified: Secondary | ICD-10-CM | POA: Diagnosis not present

## 2023-05-17 DIAGNOSIS — Z7982 Long term (current) use of aspirin: Secondary | ICD-10-CM | POA: Diagnosis not present

## 2023-05-17 DIAGNOSIS — R3911 Hesitancy of micturition: Secondary | ICD-10-CM | POA: Diagnosis not present

## 2023-05-17 DIAGNOSIS — I10 Essential (primary) hypertension: Secondary | ICD-10-CM | POA: Diagnosis not present

## 2023-05-17 DIAGNOSIS — Z7902 Long term (current) use of antithrombotics/antiplatelets: Secondary | ICD-10-CM | POA: Diagnosis not present

## 2023-05-17 DIAGNOSIS — Z8673 Personal history of transient ischemic attack (TIA), and cerebral infarction without residual deficits: Secondary | ICD-10-CM | POA: Diagnosis not present

## 2023-05-17 DIAGNOSIS — G4733 Obstructive sleep apnea (adult) (pediatric): Secondary | ICD-10-CM | POA: Diagnosis not present

## 2023-05-17 DIAGNOSIS — M109 Gout, unspecified: Secondary | ICD-10-CM | POA: Diagnosis not present

## 2023-05-17 DIAGNOSIS — I639 Cerebral infarction, unspecified: Secondary | ICD-10-CM | POA: Diagnosis not present

## 2023-05-17 DIAGNOSIS — E119 Type 2 diabetes mellitus without complications: Secondary | ICD-10-CM | POA: Diagnosis not present

## 2023-05-17 DIAGNOSIS — G8929 Other chronic pain: Secondary | ICD-10-CM | POA: Diagnosis not present

## 2023-05-17 DIAGNOSIS — R058 Other specified cough: Secondary | ICD-10-CM | POA: Diagnosis not present

## 2023-05-17 DIAGNOSIS — E785 Hyperlipidemia, unspecified: Secondary | ICD-10-CM | POA: Diagnosis not present

## 2023-05-17 DIAGNOSIS — Z9181 History of falling: Secondary | ICD-10-CM | POA: Diagnosis not present

## 2023-05-17 DIAGNOSIS — Z9049 Acquired absence of other specified parts of digestive tract: Secondary | ICD-10-CM | POA: Diagnosis not present

## 2023-05-17 DIAGNOSIS — R0981 Nasal congestion: Secondary | ICD-10-CM | POA: Diagnosis not present

## 2023-05-17 DIAGNOSIS — R0989 Other specified symptoms and signs involving the circulatory and respiratory systems: Secondary | ICD-10-CM | POA: Diagnosis not present

## 2023-05-24 DIAGNOSIS — G8929 Other chronic pain: Secondary | ICD-10-CM | POA: Diagnosis not present

## 2023-05-24 DIAGNOSIS — E119 Type 2 diabetes mellitus without complications: Secondary | ICD-10-CM | POA: Diagnosis not present

## 2023-05-24 DIAGNOSIS — Z7982 Long term (current) use of aspirin: Secondary | ICD-10-CM | POA: Diagnosis not present

## 2023-05-24 DIAGNOSIS — R0989 Other specified symptoms and signs involving the circulatory and respiratory systems: Secondary | ICD-10-CM | POA: Diagnosis not present

## 2023-05-24 DIAGNOSIS — Z9181 History of falling: Secondary | ICD-10-CM | POA: Diagnosis not present

## 2023-05-24 DIAGNOSIS — G4733 Obstructive sleep apnea (adult) (pediatric): Secondary | ICD-10-CM | POA: Diagnosis not present

## 2023-05-24 DIAGNOSIS — E785 Hyperlipidemia, unspecified: Secondary | ICD-10-CM | POA: Diagnosis not present

## 2023-05-24 DIAGNOSIS — Z7902 Long term (current) use of antithrombotics/antiplatelets: Secondary | ICD-10-CM | POA: Diagnosis not present

## 2023-05-24 DIAGNOSIS — R3911 Hesitancy of micturition: Secondary | ICD-10-CM | POA: Diagnosis not present

## 2023-05-24 DIAGNOSIS — K219 Gastro-esophageal reflux disease without esophagitis: Secondary | ICD-10-CM | POA: Diagnosis not present

## 2023-05-24 DIAGNOSIS — Z8673 Personal history of transient ischemic attack (TIA), and cerebral infarction without residual deficits: Secondary | ICD-10-CM | POA: Diagnosis not present

## 2023-05-24 DIAGNOSIS — M109 Gout, unspecified: Secondary | ICD-10-CM | POA: Diagnosis not present

## 2023-05-24 DIAGNOSIS — Z9049 Acquired absence of other specified parts of digestive tract: Secondary | ICD-10-CM | POA: Diagnosis not present

## 2023-05-24 DIAGNOSIS — R0981 Nasal congestion: Secondary | ICD-10-CM | POA: Diagnosis not present

## 2023-05-24 DIAGNOSIS — R058 Other specified cough: Secondary | ICD-10-CM | POA: Diagnosis not present

## 2023-05-24 DIAGNOSIS — I10 Essential (primary) hypertension: Secondary | ICD-10-CM | POA: Diagnosis not present

## 2023-05-24 DIAGNOSIS — J029 Acute pharyngitis, unspecified: Secondary | ICD-10-CM | POA: Diagnosis not present

## 2023-05-25 ENCOUNTER — Other Ambulatory Visit: Payer: Self-pay | Admitting: Family Medicine

## 2023-05-25 DIAGNOSIS — R0981 Nasal congestion: Secondary | ICD-10-CM | POA: Diagnosis not present

## 2023-05-25 DIAGNOSIS — E785 Hyperlipidemia, unspecified: Secondary | ICD-10-CM | POA: Diagnosis not present

## 2023-05-25 DIAGNOSIS — R3911 Hesitancy of micturition: Secondary | ICD-10-CM | POA: Diagnosis not present

## 2023-05-25 DIAGNOSIS — I10 Essential (primary) hypertension: Secondary | ICD-10-CM

## 2023-05-25 DIAGNOSIS — Z9049 Acquired absence of other specified parts of digestive tract: Secondary | ICD-10-CM | POA: Diagnosis not present

## 2023-05-25 DIAGNOSIS — J029 Acute pharyngitis, unspecified: Secondary | ICD-10-CM | POA: Diagnosis not present

## 2023-05-25 DIAGNOSIS — K219 Gastro-esophageal reflux disease without esophagitis: Secondary | ICD-10-CM | POA: Diagnosis not present

## 2023-05-25 DIAGNOSIS — Z7982 Long term (current) use of aspirin: Secondary | ICD-10-CM | POA: Diagnosis not present

## 2023-05-25 DIAGNOSIS — Z8673 Personal history of transient ischemic attack (TIA), and cerebral infarction without residual deficits: Secondary | ICD-10-CM | POA: Diagnosis not present

## 2023-05-25 DIAGNOSIS — E119 Type 2 diabetes mellitus without complications: Secondary | ICD-10-CM | POA: Diagnosis not present

## 2023-05-25 DIAGNOSIS — Z9181 History of falling: Secondary | ICD-10-CM | POA: Diagnosis not present

## 2023-05-25 DIAGNOSIS — M109 Gout, unspecified: Secondary | ICD-10-CM | POA: Diagnosis not present

## 2023-05-25 DIAGNOSIS — R058 Other specified cough: Secondary | ICD-10-CM | POA: Diagnosis not present

## 2023-05-25 DIAGNOSIS — Z7902 Long term (current) use of antithrombotics/antiplatelets: Secondary | ICD-10-CM | POA: Diagnosis not present

## 2023-05-25 DIAGNOSIS — R0989 Other specified symptoms and signs involving the circulatory and respiratory systems: Secondary | ICD-10-CM | POA: Diagnosis not present

## 2023-05-25 DIAGNOSIS — G4733 Obstructive sleep apnea (adult) (pediatric): Secondary | ICD-10-CM | POA: Diagnosis not present

## 2023-05-25 DIAGNOSIS — G8929 Other chronic pain: Secondary | ICD-10-CM | POA: Diagnosis not present

## 2023-05-28 DIAGNOSIS — I10 Essential (primary) hypertension: Secondary | ICD-10-CM | POA: Diagnosis not present

## 2023-05-30 DIAGNOSIS — I10 Essential (primary) hypertension: Secondary | ICD-10-CM | POA: Diagnosis not present

## 2023-05-30 DIAGNOSIS — R3911 Hesitancy of micturition: Secondary | ICD-10-CM | POA: Diagnosis not present

## 2023-05-30 DIAGNOSIS — E785 Hyperlipidemia, unspecified: Secondary | ICD-10-CM | POA: Diagnosis not present

## 2023-05-30 DIAGNOSIS — Z9049 Acquired absence of other specified parts of digestive tract: Secondary | ICD-10-CM | POA: Diagnosis not present

## 2023-05-30 DIAGNOSIS — Z8673 Personal history of transient ischemic attack (TIA), and cerebral infarction without residual deficits: Secondary | ICD-10-CM | POA: Diagnosis not present

## 2023-05-30 DIAGNOSIS — J029 Acute pharyngitis, unspecified: Secondary | ICD-10-CM | POA: Diagnosis not present

## 2023-05-30 DIAGNOSIS — R0989 Other specified symptoms and signs involving the circulatory and respiratory systems: Secondary | ICD-10-CM | POA: Diagnosis not present

## 2023-05-30 DIAGNOSIS — M109 Gout, unspecified: Secondary | ICD-10-CM | POA: Diagnosis not present

## 2023-05-30 DIAGNOSIS — G8929 Other chronic pain: Secondary | ICD-10-CM | POA: Diagnosis not present

## 2023-05-30 DIAGNOSIS — G4733 Obstructive sleep apnea (adult) (pediatric): Secondary | ICD-10-CM | POA: Diagnosis not present

## 2023-05-30 DIAGNOSIS — E119 Type 2 diabetes mellitus without complications: Secondary | ICD-10-CM | POA: Diagnosis not present

## 2023-05-30 DIAGNOSIS — K219 Gastro-esophageal reflux disease without esophagitis: Secondary | ICD-10-CM | POA: Diagnosis not present

## 2023-05-30 DIAGNOSIS — R058 Other specified cough: Secondary | ICD-10-CM | POA: Diagnosis not present

## 2023-05-30 DIAGNOSIS — Z9181 History of falling: Secondary | ICD-10-CM | POA: Diagnosis not present

## 2023-05-30 DIAGNOSIS — Z7982 Long term (current) use of aspirin: Secondary | ICD-10-CM | POA: Diagnosis not present

## 2023-05-30 DIAGNOSIS — Z7902 Long term (current) use of antithrombotics/antiplatelets: Secondary | ICD-10-CM | POA: Diagnosis not present

## 2023-05-30 DIAGNOSIS — R0981 Nasal congestion: Secondary | ICD-10-CM | POA: Diagnosis not present

## 2023-06-05 DIAGNOSIS — I10 Essential (primary) hypertension: Secondary | ICD-10-CM | POA: Diagnosis not present

## 2023-06-05 DIAGNOSIS — E785 Hyperlipidemia, unspecified: Secondary | ICD-10-CM | POA: Diagnosis not present

## 2023-06-05 DIAGNOSIS — E119 Type 2 diabetes mellitus without complications: Secondary | ICD-10-CM | POA: Diagnosis not present

## 2023-06-05 DIAGNOSIS — R0989 Other specified symptoms and signs involving the circulatory and respiratory systems: Secondary | ICD-10-CM | POA: Diagnosis not present

## 2023-06-05 DIAGNOSIS — K219 Gastro-esophageal reflux disease without esophagitis: Secondary | ICD-10-CM | POA: Diagnosis not present

## 2023-06-05 DIAGNOSIS — Z7982 Long term (current) use of aspirin: Secondary | ICD-10-CM | POA: Diagnosis not present

## 2023-06-05 DIAGNOSIS — R3911 Hesitancy of micturition: Secondary | ICD-10-CM | POA: Diagnosis not present

## 2023-06-05 DIAGNOSIS — Z7902 Long term (current) use of antithrombotics/antiplatelets: Secondary | ICD-10-CM | POA: Diagnosis not present

## 2023-06-05 DIAGNOSIS — R0981 Nasal congestion: Secondary | ICD-10-CM | POA: Diagnosis not present

## 2023-06-05 DIAGNOSIS — G4733 Obstructive sleep apnea (adult) (pediatric): Secondary | ICD-10-CM | POA: Diagnosis not present

## 2023-06-05 DIAGNOSIS — J029 Acute pharyngitis, unspecified: Secondary | ICD-10-CM | POA: Diagnosis not present

## 2023-06-05 DIAGNOSIS — Z9181 History of falling: Secondary | ICD-10-CM | POA: Diagnosis not present

## 2023-06-05 DIAGNOSIS — Z8673 Personal history of transient ischemic attack (TIA), and cerebral infarction without residual deficits: Secondary | ICD-10-CM | POA: Diagnosis not present

## 2023-06-05 DIAGNOSIS — Z9049 Acquired absence of other specified parts of digestive tract: Secondary | ICD-10-CM | POA: Diagnosis not present

## 2023-06-05 DIAGNOSIS — R058 Other specified cough: Secondary | ICD-10-CM | POA: Diagnosis not present

## 2023-06-05 DIAGNOSIS — G8929 Other chronic pain: Secondary | ICD-10-CM | POA: Diagnosis not present

## 2023-06-05 DIAGNOSIS — M109 Gout, unspecified: Secondary | ICD-10-CM | POA: Diagnosis not present

## 2023-06-08 DIAGNOSIS — R300 Dysuria: Secondary | ICD-10-CM | POA: Diagnosis not present

## 2023-06-11 DIAGNOSIS — N390 Urinary tract infection, site not specified: Secondary | ICD-10-CM | POA: Diagnosis not present

## 2023-06-14 DIAGNOSIS — E785 Hyperlipidemia, unspecified: Secondary | ICD-10-CM | POA: Diagnosis not present

## 2023-06-14 DIAGNOSIS — R3912 Poor urinary stream: Secondary | ICD-10-CM | POA: Diagnosis not present

## 2023-06-14 DIAGNOSIS — E119 Type 2 diabetes mellitus without complications: Secondary | ICD-10-CM | POA: Diagnosis not present

## 2023-06-14 DIAGNOSIS — I639 Cerebral infarction, unspecified: Secondary | ICD-10-CM | POA: Diagnosis not present

## 2023-06-14 DIAGNOSIS — I10 Essential (primary) hypertension: Secondary | ICD-10-CM | POA: Diagnosis not present

## 2023-06-28 DIAGNOSIS — I10 Essential (primary) hypertension: Secondary | ICD-10-CM | POA: Diagnosis not present

## 2023-06-30 DIAGNOSIS — D519 Vitamin B12 deficiency anemia, unspecified: Secondary | ICD-10-CM | POA: Diagnosis not present

## 2023-06-30 DIAGNOSIS — Z79899 Other long term (current) drug therapy: Secondary | ICD-10-CM | POA: Diagnosis not present

## 2023-06-30 DIAGNOSIS — E559 Vitamin D deficiency, unspecified: Secondary | ICD-10-CM | POA: Diagnosis not present

## 2023-06-30 DIAGNOSIS — E782 Mixed hyperlipidemia: Secondary | ICD-10-CM | POA: Diagnosis not present

## 2023-06-30 DIAGNOSIS — R7309 Other abnormal glucose: Secondary | ICD-10-CM | POA: Diagnosis not present

## 2023-06-30 DIAGNOSIS — E038 Other specified hypothyroidism: Secondary | ICD-10-CM | POA: Diagnosis not present

## 2023-07-03 DIAGNOSIS — E876 Hypokalemia: Secondary | ICD-10-CM | POA: Diagnosis not present

## 2023-07-07 DIAGNOSIS — C775 Secondary and unspecified malignant neoplasm of intrapelvic lymph nodes: Secondary | ICD-10-CM | POA: Diagnosis not present

## 2023-07-10 ENCOUNTER — Telehealth: Payer: Self-pay | Admitting: Radiation Oncology

## 2023-07-10 DIAGNOSIS — I10 Essential (primary) hypertension: Secondary | ICD-10-CM | POA: Diagnosis not present

## 2023-07-10 DIAGNOSIS — E785 Hyperlipidemia, unspecified: Secondary | ICD-10-CM | POA: Diagnosis not present

## 2023-07-10 DIAGNOSIS — I639 Cerebral infarction, unspecified: Secondary | ICD-10-CM | POA: Diagnosis not present

## 2023-07-10 NOTE — Telephone Encounter (Signed)
 Received VM from [facility]. Returned call from [facility] to schedule pt for CON30 appt with PA Bruning next available.

## 2023-07-10 NOTE — Telephone Encounter (Signed)
 Pt's sister returned call advising to contact [facility] and speak with Dewayne Hatch to coordinate appt for pt @ [phone]. Left message with receptionist who advised Dewayne Hatch is away at another appt at this time.

## 2023-07-11 DIAGNOSIS — D519 Vitamin B12 deficiency anemia, unspecified: Secondary | ICD-10-CM | POA: Diagnosis not present

## 2023-07-11 DIAGNOSIS — Z79899 Other long term (current) drug therapy: Secondary | ICD-10-CM | POA: Diagnosis not present

## 2023-07-11 DIAGNOSIS — E782 Mixed hyperlipidemia: Secondary | ICD-10-CM | POA: Diagnosis not present

## 2023-07-11 DIAGNOSIS — R7309 Other abnormal glucose: Secondary | ICD-10-CM | POA: Diagnosis not present

## 2023-07-20 ENCOUNTER — Encounter: Payer: Self-pay | Admitting: Urology

## 2023-07-20 ENCOUNTER — Ambulatory Visit
Admission: RE | Admit: 2023-07-20 | Discharge: 2023-07-20 | Disposition: A | Discharge status: Home / Self Care | Source: Ambulatory Visit | Attending: Urology | Admitting: Urology

## 2023-07-20 VITALS — BP 158/94 | HR 113 | Temp 96.8°F | Resp 18 | Ht 69.0 in | Wt 234.4 lb

## 2023-07-20 DIAGNOSIS — C61 Malignant neoplasm of prostate: Secondary | ICD-10-CM | POA: Insufficient documentation

## 2023-07-20 DIAGNOSIS — Z191 Hormone sensitive malignancy status: Secondary | ICD-10-CM | POA: Diagnosis not present

## 2023-07-20 NOTE — Progress Notes (Signed)
 Reconsult- Nursing interview for a diagnosis of Prostate cancer (HCC) Stage IVA (pT3b, pN1, cM0, PSA: 55, Grade Group: 5).  Patient identity verified x2.   Patient reports a slow urine stream, polyuria. Patient denies all other related issues at this time.  Meaningful use complete.  I-PSS score- 25 - Severe SHIM score- 3- No sexual activity within the last 6 months. Urinary Management medication(s) None Urology appointment date- Pending with Dr. Arita Miss at [facility]  Vitals- BP (!) 158/94 (BP Location: Left Arm, Patient Position: Sitting, Cuff Size: Normal)   Pulse (!) 113   Temp (!) 96.8 F (36 C) (Temporal)   Resp 18   Ht 5\' 9"  (1.753 m)   Wt 234 lb 6 oz (106.3 kg)   SpO2 96%   BMI 34.61 kg/m   This concludes the interaction.  Ruel Favors, LPN

## 2023-07-21 DIAGNOSIS — E119 Type 2 diabetes mellitus without complications: Secondary | ICD-10-CM | POA: Diagnosis not present

## 2023-07-21 DIAGNOSIS — E559 Vitamin D deficiency, unspecified: Secondary | ICD-10-CM | POA: Diagnosis not present

## 2023-07-21 DIAGNOSIS — R7309 Other abnormal glucose: Secondary | ICD-10-CM | POA: Diagnosis not present

## 2023-07-21 DIAGNOSIS — R899 Unspecified abnormal finding in specimens from other organs, systems and tissues: Secondary | ICD-10-CM | POA: Diagnosis not present

## 2023-07-21 NOTE — Progress Notes (Signed)
 Radiation Oncology         [id number]) [phone] ________________________________   Outpatient radiation Oncology Re-Consultation  Name: Jacob [name] MRN: [id number]  Date: 07/20/2023  DOB: [birth year]  [id number], Daphine Deutscher DO  Heloise Purpura, MD   REFERRING PHYSICIAN: Heloise Purpura, MD  DIAGNOSIS: 68 y.o. gentleman with a rising, detectable postoperative PSA of 2.6 s/p RALP 01/2022 for Stage pT3bN1, Gleason 4+5 prostate cancer    ICD-10-CM   1. Malignant neoplasm of prostate (HCC)  C61     2. Prostate cancer (HCC)  C61       HISTORY OF PRESENT ILLNESS: Jacob [name] is a 68 y.o. male with a diagnosis of locally advanced prostate cancer, involving 1 of 12 nodes sampled at the time of his robotic prostatectomy.   In summary, he was initially referred to Dr. Arita Miss for urinary retention in 07/2021. He was also found to have an elevated PSA of 55 in 08/2021. He was subsequently diagnosed with Gleason 4+5 prostate cancer on TRUSPBx on 10/12/22. Of note, only 1/12 cores showed Gleason 4+5 disease, while the remaining 11/12 showed Gleason 7 (4+3 and 3+4).     His staging PSMA PET scan from 01/04/22 was negative. He opted to proceed with RALP on 02/14/22 under the care of of Dr. Laverle Patter.  Final surgical pathology revealed pT3b N1 MX, Gleason 4+5 prostatic adenocarcinoma with multifocal extraprostatic extension, multifocal surgical margin involvement, left seminal vesicle invasion, and one positive lymph node (1/12).           His postoperative course was somewhat complicated by significant abdominal pain and dysuria. He was treated for UTI. When he returned in 07/2022 for his postoperative follow up, his PSA was 2.4.  He underwent restaging PSMA PET scan on 09/01/22 which did not show any evidence of visible local prostate cancer recurrence, metastatic adenopathy, visceral metastasis, or skeletal metastasis. A repeat PSA the following week was slightly further elevated at 2.61.  He was started on ADT  with 66-month Eligard on 09/29/22. We initially met with the patient in multidisciplinary prostate conference in July 2024 and at that time, he continued to struggle with postoperative incontinence and did not feel like he had made any significant improvement despite his home exercise program.  The recommendation was to continue on ADT and working with PT to regain bladder control prior to proceeding with salvage fossa and pelvic radiotherapy.  We continue to make attempts to touch base with him in regards to his progress and/or scheduling treatment planning but he became severely depressed and avoided all calls and follow-up visits.  He had an acute stroke in December 2024 and was discharged home to [facility] facility.  He saw Dr. Laverle Patter for follow-up on 04/24/2023 and got an additional 50-month Eligard injection.  PSA at that time was 0.97.  [name] now currently residing at [facility].  Repeat PSA on 07/10/2023 had decreased further at 0.72 and Dr. Laverle Patter felt like he had recovered from the CVA and was ready to start salvage radiation.  Therefore, he has kindly been referred back to [place] today for further discussion regarding starting the salvage radiation treatments.  [name] accompanied by a staff member from [facility] for today's visit.  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 meds   Increased urinary frequency    Kidney stones 08/10/2011   once in past, none recent   Methylprednisolone-induced hypoxia 08/23/2011   Neuromuscular disorder (HCC) 08/10/2011   "burning/tingling sensation In feet"   Obesity    Obesity, Class II, BMI 35-39.9, with comorbidity 06/29/2006   OSA (obstructive sleep apnea)    "couldn't deal w/CPAP" (06/08/2016)   Preoperative clearance    Prostate cancer (HCC)    RUQ pain 06/09/2016   Shortness of breath 08/10/2011   with increased activity only   Syncope and collapse 06/09/2016   Syncope and collapse 06/09/2016   Type II diabetes mellitus (HCC)    Umbilical hernia       PAST SURGICAL HISTORY: Past Surgical History:  Procedure Laterality Date   CATARACT EXTRACTION W/ INTRAOCULAR LENS  IMPLANT, BILATERAL Bilateral 08/10/2011 - ~ 2014   "right - left"   CHOLECYSTECTOMY N/A 06/10/2016   Procedure: LAPAROSCOPIC CHOLECYSTECTOMY WITH INTRAOPERATIVE CHOLANGIOGRAM;  Surgeon: Harriette Bouillon, MD;  Location: [facility] OR;  Service: General;  Laterality: N/A;   COLONOSCOPY W/ BIOPSIES AND POLYPECTOMY  2013   HERNIA REPAIR     LIPOMA EXCISION Right 10/18/2018   Procedure: EXCISION RIGHT BACK  LIPOMA X 2;  Surgeon: Harriette Bouillon, MD;  Location: [facility];  Service: General;  Laterality: Right;   LYMPHADENECTOMY Bilateral 02/14/2022   Procedure: LYMPHADENECTOMY, PELVIC;  Surgeon: Heloise Purpura, MD;  Location: [facility];  Service: Urology;  Laterality:  Bilateral;   PROSTATE BIOPSY     ROBOT ASSISTED LAPAROSCOPIC RADICAL PROSTATECTOMY N/A 02/14/2022   Procedure: XI ROBOTIC ASSISTED LAPAROSCOPIC RADICAL PROSTATECTOMY LEVEL 2;  Surgeon: Heloise Purpura, MD;  Location: [facility];  Service: Urology;  Laterality: N/A;   VENTRAL HERNIA REPAIR  08/22/2011   Procedure: HERNIA REPAIR VENTRAL ADULT;  Surgeon: Ernestene Mention, MD;  Location: [facility];  Service: General;  Laterality: N/A;  incarcerated    FAMILY HISTORY:  Family History  Problem Relation Age of Onset   Hyperlipidemia Mother    Diabetes Mother    Stroke Mother    Hyperlipidemia Father    Colon cancer Neg Hx     SOCIAL HISTORY:  Social History   Socioeconomic History   Marital status: Single    Spouse name: Not on file   Number of children: 0   Years of education: Not on file   Highest education level: Not on file  Occupational History   Occupation: Unemployed   Occupation: Disabled  Tobacco Use   Smoking status: Never   Smokeless tobacco: Never  Advertising account planner  Vaping status: Never Used  Substance and Sexual Activity   Alcohol use: No   Drug use: No   Sexual activity: Never  Other Topics Concern   Not on file  Social History Narrative   Daily caffeine    Social Drivers of Health   Financial Resource Strain: High Risk (11/16/2022)   Overall Financial Resource Strain (CARDIA)    Difficulty of Paying Living Expenses: Very hard  Food Insecurity: No Food Insecurity (07/20/2023)   Hunger Vital Sign    Worried About Running Out of Food in the Last Year: Never true    Ran Out of Food in the Last Year: Never true  Transportation Needs: No Transportation Needs (07/20/2023)   PRAPARE - Administrator, Civil Service (Medical): No    Lack of Transportation (Non-Medical): No  Physical Activity: Not on file  Stress: Stress Concern Present (04/16/2019)   [employer] of Occupational Health - Occupational Stress Questionnaire    Feeling of Stress : Very much   Social Connections: Not on file  Intimate Partner Violence: Not At Risk (07/20/2023)   Humiliation, Afraid, Rape, and Kick questionnaire    Fear of Current or Ex-Partner: No    Emotionally Abused: No    Physically Abused: No    Sexually Abused: No    ALLERGIES: Methylprednisolone sodium succ and Ms contin [morphine]  MEDICATIONS:  Current Outpatient Medications  Medication Sig Dispense Refill   amLODipine (NORVASC) 10 MG tablet TAKE 1 TABLET BY MOUTH EVERY DAY 90 tablet 1   aspirin 81 MG chewable tablet Chew 1 tablet (81 mg total) by mouth daily.     atorvastatin (LIPITOR) 80 MG tablet Take 1 tablet (80 mg total) by mouth daily.     venlafaxine XR (EFFEXOR-XR) 37.5 MG 24 hr capsule TAKE 1 CAPSULE BY MOUTH DAILY WITH BREAKFAST. 90 capsule 1   No current facility-administered medications for this encounter.    REVIEW OF SYSTEMS:  On review of systems, the patient reports that he is doing well overall but is intermittently tearful throughout our conversation. He denies any chest pain, shortness of breath, cough, fevers, chills, night sweats, unintended weight changes. He denies any bowel disturbances, and denies abdominal pain, nausea or vomiting. He denies any new musculoskeletal or joint aches or pains. His IPSS was 25, indicating severe urinary symptoms. He reports severe urinary hesitancy, weak flow of stream, straining to void, small-volume voiding and incomplete bladder emptying since the time of his stroke-likely at least some component of neurogenic bladder s/p CVA. His SHIM was 3, indicating he has severe erectile dysfunction. A complete review of systems is obtained and is otherwise negative.    PHYSICAL EXAM:  Wt Readings from Last 3 Encounters:  07/20/23 234 lb 6 oz (106.3 kg)  04/04/23 176 lb (79.8 kg)  02/27/23 216 lb 12.8 oz (98.3 kg)   Temp Readings from Last 3 Encounters:  07/20/23 (!) 96.8 F (36 C) (Temporal)  04/11/23 98.5 F (36.9 C) (Oral)  11/08/22 (!) 97.3 F  (36.3 C) (Oral)   BP Readings from Last 3 Encounters:  07/20/23 (!) 158/94  04/11/23 (!) 156/88  02/27/23 (!) 184/94   Pulse Readings from Last 3 Encounters:  07/20/23 (!) 113  04/11/23 93  02/27/23 91   Pain Assessment Pain Score: 0-No pain/10  In general this is a well appearing Caucasian man in no acute distress. He's alert and oriented x4 and appropriate throughout the examination but is emotionally labile. Cardiopulmonary assessment is negative for  acute distress, and he exhibits normal effort.     KPS = 90  100 - Normal; no complaints; no evidence of disease. 90   - Able to carry on normal activity; minor signs or symptoms of disease. 80   - Normal activity with effort; some signs or symptoms of disease. 2   - Cares for self; unable to carry on normal activity or to do active work. 60   - Requires occasional assistance, but is able to care for most of his personal needs. 50   - Requires considerable assistance and frequent medical care. 40   - Disabled; requires special care and assistance. 30   - Severely disabled; hospital admission is indicated although death not imminent. 20   - Very sick; hospital admission necessary; active supportive treatment necessary. 10   - Moribund; fatal processes progressing rapidly. 0     - Dead  Karnofsky DA, Abelmann WH, Craver LS and [facility] [phone]) The use of the nitrogen mustards in the palliative treatment of carcinoma: with particular reference to bronchogenic carcinoma Cancer 1 634-56  LABORATORY DATA:  Lab Results  Component Value Date   WBC 9.4 04/08/2023   HGB 13.3 04/08/2023   HCT 38.6 (L) 04/08/2023   MCV 82.0 04/08/2023   PLT 172 04/08/2023   Lab Results  Component Value Date   NA 142 04/08/2023   K 3.8 04/08/2023   CL 104 04/08/2023   CO2 27 04/08/2023   Lab Results  Component Value Date   ALT 18 04/08/2023   AST 18 04/08/2023   ALKPHOS 69 04/08/2023   BILITOT 0.7 04/08/2023     RADIOGRAPHY: No  results found.    IMPRESSION/PLAN: 1. 68 y.o. gentleman with a rising, detectable postoperative PSA of 2.6 s/p RALP 01/2022 for Stage pT3bN1, Gleason 4+5 prostate cancer. Today, we reviewed the findings and workup thus far.  We discussed the natural history of prostate cancer.  We reviewed the the implications of positive margins, extracapsular extension, and seminal vesicle involvement on the risk of prostate cancer recurrence. In his case, multifocal extraprostatic extension, multifocal margin involvement, left seminal vesicle invasion, and one positive lymph node were all present. We reviewed some of the evidence suggesting an advantage for patients who undergo salvage radiotherapy in this setting in terms of disease control and overall survival. We discussed radiation treatment directed to the prostatic fossa and pelvic lymph nodes with regard to the logistics and delivery of external beam radiation treatment.  At the conclusion of our conversation, the patient is interested in moving forward with 7.5 weeks of salvage radiation treatment directed to the prostatic fossa and pelvic lymph nodes in combination with ADT. He has continued on Eligard ADT every 6 months, with his most recent injection on 04/24/2023.  He appears to have a good understanding of his disease and our treatment recommendations which are of curative intent and is in agreement with the stated plan.  He has freely signed written consent to proceed today in the office and a copy of this document will be placed in his medical record.  [name] tentatively scheduled for CT simulation/treatment planning at 2:30 PM on Wednesday, 07/26/2023, in anticipation of beginning his daily treatments in the near future.  We will coordinate his daily treatments with the staff at [facility].  We enjoyed meeting with him again today and look forward to continuing to participate in his care.  He knows that he is welcome to call at anytime  with any questions or concerns related to the treatment recommendations discussed today.  We personally spent 60 minutes in this encounter including chart review, reviewing radiological studies, meeting face-to-face with the patient, entering orders and completing documentation.   Marguarite Arbour, PA-C    Margaretmary Dys, MD  [facility]  Radiation Oncology Direct Dial: [phone]  Fax: [phone] [email or web address]  Skype  [email or web address]

## 2023-07-21 NOTE — Progress Notes (Signed)
 RN left message for patient to call back.   Spoke with patient's sister, [name].  All questions answered related to side effects with radiation.  Patient is still residing at an assisted living facility and transportation will be provided by facility.  No additional needs or barriers at this time.

## 2023-07-26 ENCOUNTER — Ambulatory Visit
Admission: RE | Admit: 2023-07-26 | Discharge: 2023-07-26 | Disposition: A | Discharge status: Home / Self Care | Source: Ambulatory Visit | Attending: Radiation Oncology | Admitting: Radiation Oncology

## 2023-07-26 DIAGNOSIS — C61 Malignant neoplasm of prostate: Secondary | ICD-10-CM | POA: Insufficient documentation

## 2023-07-26 DIAGNOSIS — Z191 Hormone sensitive malignancy status: Secondary | ICD-10-CM | POA: Diagnosis not present

## 2023-07-26 DIAGNOSIS — E119 Type 2 diabetes mellitus without complications: Secondary | ICD-10-CM | POA: Diagnosis not present

## 2023-07-27 DIAGNOSIS — I10 Essential (primary) hypertension: Secondary | ICD-10-CM | POA: Diagnosis not present

## 2023-07-27 NOTE — Progress Notes (Signed)
  Radiation Oncology         [phone] ________________________________  Name: Jacob [name] MRN: [id number]  Date: 07/26/2023  DOB: [birth year]  SIMULATION AND TREATMENT PLANNING NOTE    ICD-10-CM   1. Prostate cancer [facility])  C61       DIAGNOSIS:  68 y.o. gentleman with a rising, detectable postoperative PSA of 2.6 s/p RALP 01/2022 for Stage pT3bN1, Gleason 4+5 prostate cancer   NARRATIVE:  The patient was brought to the CT Simulation planning suite.  Identity was confirmed.  All relevant records and images related to the planned course of therapy were reviewed.  The patient freely provided informed written consent to proceed with treatment after reviewing the details related to the planned course of therapy. The consent form was witnessed and verified by the simulation staff.  Then, the patient was set-up in a stable reproducible supine position for radiation therapy.  A vacuum lock pillow device was custom fabricated to position his legs in a reproducible immobilized position.  Then, I performed a urethrogram under sterile conditions to identify the prostatic apex.  CT images were obtained.  Surface markings were placed.  The CT images were loaded into the planning software.  Then the prostate target and avoidance structures including the rectum, bladder, bowel and hips were contoured.  Treatment planning then occurred.  The radiation prescription was entered and confirmed.  A total of one complex treatment devices was fabricated. I have requested : Intensity Modulated Radiotherapy (IMRT) is medically necessary for this case for the following reason:  Rectal sparing.Marland Kitchen  PLAN:   The prostate fossa and pelvic lymph nodes will initially be treated to 45 Gy in 25 fractions of 1.8 Gy followed by a boost to the fossa only, to 68.4 Gy with 13 additional fractions of 1.8 Gy   ________________________________  Artist Pais Kathrynn Running, M.D.

## 2023-08-02 DIAGNOSIS — C61 Malignant neoplasm of prostate: Secondary | ICD-10-CM | POA: Diagnosis present

## 2023-08-02 DIAGNOSIS — Z510 Encounter for antineoplastic radiation therapy: Secondary | ICD-10-CM | POA: Diagnosis not present

## 2023-08-02 DIAGNOSIS — Z191 Hormone sensitive malignancy status: Secondary | ICD-10-CM | POA: Diagnosis not present

## 2023-08-07 ENCOUNTER — Ambulatory Visit
Admission: RE | Admit: 2023-08-07 | Discharge: 2023-08-07 | Disposition: A | Discharge status: Home / Self Care | Source: Ambulatory Visit | Attending: Radiation Oncology | Admitting: Radiation Oncology

## 2023-08-07 ENCOUNTER — Other Ambulatory Visit: Payer: Self-pay

## 2023-08-07 ENCOUNTER — Ambulatory Visit

## 2023-08-07 DIAGNOSIS — Z191 Hormone sensitive malignancy status: Secondary | ICD-10-CM | POA: Diagnosis not present

## 2023-08-07 DIAGNOSIS — Z510 Encounter for antineoplastic radiation therapy: Secondary | ICD-10-CM | POA: Diagnosis not present

## 2023-08-07 DIAGNOSIS — C61 Malignant neoplasm of prostate: Secondary | ICD-10-CM | POA: Diagnosis not present

## 2023-08-07 LAB — RAD ONC ARIA SESSION SUMMARY
Course Elapsed Days: 0
Plan Fractions Treated to Date: 1
Plan Prescribed Dose Per Fraction: 1.8 Gy
Plan Total Fractions Prescribed: 25
Plan Total Prescribed Dose: 45 Gy
Reference Point Dosage Given to Date: 1.8 Gy
Reference Point Session Dosage Given: 1.8 Gy
Session Number: 1

## 2023-08-08 ENCOUNTER — Ambulatory Visit
Admission: RE | Admit: 2023-08-08 | Discharge: 2023-08-08 | Disposition: A | Discharge status: Home / Self Care | Source: Ambulatory Visit | Attending: Radiation Oncology | Admitting: Radiation Oncology

## 2023-08-08 ENCOUNTER — Other Ambulatory Visit: Payer: Self-pay

## 2023-08-08 DIAGNOSIS — C61 Malignant neoplasm of prostate: Secondary | ICD-10-CM | POA: Diagnosis not present

## 2023-08-08 DIAGNOSIS — Z510 Encounter for antineoplastic radiation therapy: Secondary | ICD-10-CM | POA: Diagnosis not present

## 2023-08-08 DIAGNOSIS — Z191 Hormone sensitive malignancy status: Secondary | ICD-10-CM | POA: Diagnosis not present

## 2023-08-08 LAB — RAD ONC ARIA SESSION SUMMARY
Course Elapsed Days: 1
Plan Fractions Treated to Date: 2
Plan Prescribed Dose Per Fraction: 1.8 Gy
Plan Total Fractions Prescribed: 25
Plan Total Prescribed Dose: 45 Gy
Reference Point Dosage Given to Date: 3.6 Gy
Reference Point Session Dosage Given: 1.8 Gy
Session Number: 2

## 2023-08-09 ENCOUNTER — Other Ambulatory Visit: Payer: Self-pay

## 2023-08-09 ENCOUNTER — Ambulatory Visit

## 2023-08-09 ENCOUNTER — Ambulatory Visit
Admission: RE | Admit: 2023-08-09 | Discharge: 2023-08-09 | Disposition: A | Discharge status: Home / Self Care | Source: Ambulatory Visit | Attending: Radiation Oncology | Admitting: Radiation Oncology

## 2023-08-09 DIAGNOSIS — C61 Malignant neoplasm of prostate: Secondary | ICD-10-CM | POA: Diagnosis not present

## 2023-08-09 DIAGNOSIS — Z510 Encounter for antineoplastic radiation therapy: Secondary | ICD-10-CM | POA: Diagnosis not present

## 2023-08-09 DIAGNOSIS — R100 Acute abdomen: Secondary | ICD-10-CM | POA: Diagnosis not present

## 2023-08-09 DIAGNOSIS — Z191 Hormone sensitive malignancy status: Secondary | ICD-10-CM | POA: Diagnosis not present

## 2023-08-09 DIAGNOSIS — R110 Nausea: Secondary | ICD-10-CM | POA: Diagnosis not present

## 2023-08-09 LAB — RAD ONC ARIA SESSION SUMMARY
Course Elapsed Days: 2
Plan Fractions Treated to Date: 3
Plan Prescribed Dose Per Fraction: 1.8 Gy
Plan Total Fractions Prescribed: 25
Plan Total Prescribed Dose: 45 Gy
Reference Point Dosage Given to Date: 5.4 Gy
Reference Point Session Dosage Given: 1.8 Gy
Session Number: 3

## 2023-08-10 ENCOUNTER — Ambulatory Visit
Admission: RE | Admit: 2023-08-10 | Discharge: 2023-08-10 | Disposition: A | Discharge status: Home / Self Care | Source: Ambulatory Visit | Attending: Radiation Oncology | Admitting: Radiation Oncology

## 2023-08-10 ENCOUNTER — Other Ambulatory Visit: Payer: Self-pay

## 2023-08-10 DIAGNOSIS — C61 Malignant neoplasm of prostate: Secondary | ICD-10-CM | POA: Diagnosis not present

## 2023-08-10 DIAGNOSIS — Z191 Hormone sensitive malignancy status: Secondary | ICD-10-CM | POA: Diagnosis not present

## 2023-08-10 DIAGNOSIS — Z510 Encounter for antineoplastic radiation therapy: Secondary | ICD-10-CM | POA: Diagnosis not present

## 2023-08-10 LAB — RAD ONC ARIA SESSION SUMMARY
Course Elapsed Days: 3
Plan Fractions Treated to Date: 4
Plan Prescribed Dose Per Fraction: 1.8 Gy
Plan Total Fractions Prescribed: 25
Plan Total Prescribed Dose: 45 Gy
Reference Point Dosage Given to Date: 7.2 Gy
Reference Point Session Dosage Given: 1.8 Gy
Session Number: 4

## 2023-08-11 ENCOUNTER — Other Ambulatory Visit: Payer: Self-pay

## 2023-08-11 ENCOUNTER — Ambulatory Visit
Admission: RE | Admit: 2023-08-11 | Discharge: 2023-08-11 | Disposition: A | Discharge status: Home / Self Care | Source: Ambulatory Visit | Attending: Radiation Oncology | Admitting: Radiation Oncology

## 2023-08-11 DIAGNOSIS — I10 Essential (primary) hypertension: Secondary | ICD-10-CM | POA: Diagnosis not present

## 2023-08-11 DIAGNOSIS — C61 Malignant neoplasm of prostate: Secondary | ICD-10-CM | POA: Diagnosis not present

## 2023-08-11 DIAGNOSIS — Z191 Hormone sensitive malignancy status: Secondary | ICD-10-CM | POA: Diagnosis not present

## 2023-08-11 DIAGNOSIS — Z510 Encounter for antineoplastic radiation therapy: Secondary | ICD-10-CM | POA: Diagnosis not present

## 2023-08-11 DIAGNOSIS — I639 Cerebral infarction, unspecified: Secondary | ICD-10-CM | POA: Diagnosis not present

## 2023-08-11 DIAGNOSIS — E785 Hyperlipidemia, unspecified: Secondary | ICD-10-CM | POA: Diagnosis not present

## 2023-08-11 LAB — RAD ONC ARIA SESSION SUMMARY
Course Elapsed Days: 4
Plan Fractions Treated to Date: 5
Plan Prescribed Dose Per Fraction: 1.8 Gy
Plan Total Fractions Prescribed: 25
Plan Total Prescribed Dose: 45 Gy
Reference Point Dosage Given to Date: 9 Gy
Reference Point Session Dosage Given: 1.8 Gy
Session Number: 5

## 2023-08-14 ENCOUNTER — Other Ambulatory Visit: Payer: Self-pay

## 2023-08-14 ENCOUNTER — Ambulatory Visit
Admission: RE | Admit: 2023-08-14 | Discharge: 2023-08-14 | Disposition: A | Discharge status: Home / Self Care | Source: Ambulatory Visit | Attending: Radiation Oncology | Admitting: Radiation Oncology

## 2023-08-14 DIAGNOSIS — Z191 Hormone sensitive malignancy status: Secondary | ICD-10-CM | POA: Diagnosis not present

## 2023-08-14 DIAGNOSIS — Z510 Encounter for antineoplastic radiation therapy: Secondary | ICD-10-CM | POA: Diagnosis not present

## 2023-08-14 DIAGNOSIS — C61 Malignant neoplasm of prostate: Secondary | ICD-10-CM | POA: Diagnosis not present

## 2023-08-14 LAB — RAD ONC ARIA SESSION SUMMARY
Course Elapsed Days: 7
Plan Fractions Treated to Date: 6
Plan Prescribed Dose Per Fraction: 1.8 Gy
Plan Total Fractions Prescribed: 25
Plan Total Prescribed Dose: 45 Gy
Reference Point Dosage Given to Date: 10.8 Gy
Reference Point Session Dosage Given: 1.8 Gy
Session Number: 6

## 2023-08-15 ENCOUNTER — Ambulatory Visit
Admission: RE | Admit: 2023-08-15 | Discharge: 2023-08-15 | Disposition: A | Discharge status: Home / Self Care | Source: Ambulatory Visit | Attending: Radiation Oncology | Admitting: Radiation Oncology

## 2023-08-15 ENCOUNTER — Other Ambulatory Visit: Payer: Self-pay

## 2023-08-15 DIAGNOSIS — C61 Malignant neoplasm of prostate: Secondary | ICD-10-CM | POA: Diagnosis not present

## 2023-08-15 DIAGNOSIS — Z510 Encounter for antineoplastic radiation therapy: Secondary | ICD-10-CM | POA: Diagnosis not present

## 2023-08-15 DIAGNOSIS — Z191 Hormone sensitive malignancy status: Secondary | ICD-10-CM | POA: Diagnosis not present

## 2023-08-15 LAB — RAD ONC ARIA SESSION SUMMARY
Course Elapsed Days: 8
Plan Fractions Treated to Date: 7
Plan Prescribed Dose Per Fraction: 1.8 Gy
Plan Total Fractions Prescribed: 25
Plan Total Prescribed Dose: 45 Gy
Reference Point Dosage Given to Date: 12.6 Gy
Reference Point Session Dosage Given: 1.8 Gy
Session Number: 7

## 2023-08-16 ENCOUNTER — Other Ambulatory Visit: Payer: Self-pay

## 2023-08-16 ENCOUNTER — Ambulatory Visit
Admission: RE | Admit: 2023-08-16 | Discharge: 2023-08-16 | Disposition: A | Discharge status: Home / Self Care | Source: Ambulatory Visit | Attending: Radiation Oncology | Admitting: Radiation Oncology

## 2023-08-16 DIAGNOSIS — Z510 Encounter for antineoplastic radiation therapy: Secondary | ICD-10-CM | POA: Diagnosis not present

## 2023-08-16 DIAGNOSIS — Z191 Hormone sensitive malignancy status: Secondary | ICD-10-CM | POA: Diagnosis not present

## 2023-08-16 DIAGNOSIS — C61 Malignant neoplasm of prostate: Secondary | ICD-10-CM | POA: Diagnosis not present

## 2023-08-16 LAB — RAD ONC ARIA SESSION SUMMARY
Course Elapsed Days: 9
Plan Fractions Treated to Date: 8
Plan Prescribed Dose Per Fraction: 1.8 Gy
Plan Total Fractions Prescribed: 25
Plan Total Prescribed Dose: 45 Gy
Reference Point Dosage Given to Date: 14.4 Gy
Reference Point Session Dosage Given: 1.8 Gy
Session Number: 8

## 2023-08-17 ENCOUNTER — Ambulatory Visit
Admission: RE | Admit: 2023-08-17 | Discharge: 2023-08-17 | Disposition: A | Discharge status: Home / Self Care | Source: Ambulatory Visit | Attending: Radiation Oncology | Admitting: Radiation Oncology

## 2023-08-17 ENCOUNTER — Other Ambulatory Visit: Payer: Self-pay

## 2023-08-17 DIAGNOSIS — C61 Malignant neoplasm of prostate: Secondary | ICD-10-CM | POA: Diagnosis not present

## 2023-08-17 DIAGNOSIS — Z510 Encounter for antineoplastic radiation therapy: Secondary | ICD-10-CM | POA: Diagnosis not present

## 2023-08-17 DIAGNOSIS — Z191 Hormone sensitive malignancy status: Secondary | ICD-10-CM | POA: Diagnosis not present

## 2023-08-17 LAB — RAD ONC ARIA SESSION SUMMARY
Course Elapsed Days: 10
Plan Fractions Treated to Date: 9
Plan Prescribed Dose Per Fraction: 1.8 Gy
Plan Total Fractions Prescribed: 25
Plan Total Prescribed Dose: 45 Gy
Reference Point Dosage Given to Date: 16.2 Gy
Reference Point Session Dosage Given: 1.8 Gy
Session Number: 9

## 2023-08-18 ENCOUNTER — Ambulatory Visit
Admission: RE | Admit: 2023-08-18 | Discharge: 2023-08-18 | Disposition: A | Discharge status: Home / Self Care | Source: Ambulatory Visit | Attending: Radiation Oncology | Admitting: Radiation Oncology

## 2023-08-18 ENCOUNTER — Other Ambulatory Visit: Payer: Self-pay

## 2023-08-18 DIAGNOSIS — C61 Malignant neoplasm of prostate: Secondary | ICD-10-CM | POA: Diagnosis not present

## 2023-08-18 DIAGNOSIS — Z510 Encounter for antineoplastic radiation therapy: Secondary | ICD-10-CM | POA: Diagnosis not present

## 2023-08-18 DIAGNOSIS — Z191 Hormone sensitive malignancy status: Secondary | ICD-10-CM | POA: Diagnosis not present

## 2023-08-18 LAB — RAD ONC ARIA SESSION SUMMARY
Course Elapsed Days: 11
Plan Fractions Treated to Date: 10
Plan Prescribed Dose Per Fraction: 1.8 Gy
Plan Total Fractions Prescribed: 25
Plan Total Prescribed Dose: 45 Gy
Reference Point Dosage Given to Date: 18 Gy
Reference Point Session Dosage Given: 1.8 Gy
Session Number: 10

## 2023-08-21 ENCOUNTER — Ambulatory Visit
Admission: RE | Admit: 2023-08-21 | Discharge: 2023-08-21 | Disposition: A | Discharge status: Home / Self Care | Source: Ambulatory Visit | Attending: Radiation Oncology | Admitting: Radiation Oncology

## 2023-08-21 ENCOUNTER — Other Ambulatory Visit: Payer: Self-pay

## 2023-08-21 DIAGNOSIS — Z510 Encounter for antineoplastic radiation therapy: Secondary | ICD-10-CM | POA: Diagnosis not present

## 2023-08-21 DIAGNOSIS — Z191 Hormone sensitive malignancy status: Secondary | ICD-10-CM | POA: Diagnosis not present

## 2023-08-21 DIAGNOSIS — C61 Malignant neoplasm of prostate: Secondary | ICD-10-CM | POA: Diagnosis not present

## 2023-08-21 LAB — RAD ONC ARIA SESSION SUMMARY
Course Elapsed Days: 14
Plan Fractions Treated to Date: 11
Plan Prescribed Dose Per Fraction: 1.8 Gy
Plan Total Fractions Prescribed: 25
Plan Total Prescribed Dose: 45 Gy
Reference Point Dosage Given to Date: 19.8 Gy
Reference Point Session Dosage Given: 1.8 Gy
Session Number: 11

## 2023-08-22 ENCOUNTER — Ambulatory Visit
Admission: RE | Admit: 2023-08-22 | Discharge: 2023-08-22 | Disposition: A | Discharge status: Home / Self Care | Source: Ambulatory Visit | Attending: Radiation Oncology | Admitting: Radiation Oncology

## 2023-08-22 ENCOUNTER — Other Ambulatory Visit: Payer: Self-pay

## 2023-08-22 DIAGNOSIS — Z510 Encounter for antineoplastic radiation therapy: Secondary | ICD-10-CM | POA: Diagnosis not present

## 2023-08-22 DIAGNOSIS — C61 Malignant neoplasm of prostate: Secondary | ICD-10-CM | POA: Diagnosis not present

## 2023-08-22 DIAGNOSIS — Z191 Hormone sensitive malignancy status: Secondary | ICD-10-CM | POA: Diagnosis not present

## 2023-08-22 LAB — RAD ONC ARIA SESSION SUMMARY
Course Elapsed Days: 15
Plan Fractions Treated to Date: 12
Plan Prescribed Dose Per Fraction: 1.8 Gy
Plan Total Fractions Prescribed: 25
Plan Total Prescribed Dose: 45 Gy
Reference Point Dosage Given to Date: 21.6 Gy
Reference Point Session Dosage Given: 1.8 Gy
Session Number: 12

## 2023-08-23 ENCOUNTER — Other Ambulatory Visit: Payer: Self-pay

## 2023-08-23 ENCOUNTER — Ambulatory Visit
Admission: RE | Admit: 2023-08-23 | Discharge: 2023-08-23 | Disposition: A | Discharge status: Home / Self Care | Source: Ambulatory Visit | Attending: Radiation Oncology

## 2023-08-23 DIAGNOSIS — Z510 Encounter for antineoplastic radiation therapy: Secondary | ICD-10-CM | POA: Diagnosis not present

## 2023-08-23 DIAGNOSIS — I10 Essential (primary) hypertension: Secondary | ICD-10-CM | POA: Diagnosis not present

## 2023-08-23 DIAGNOSIS — E119 Type 2 diabetes mellitus without complications: Secondary | ICD-10-CM | POA: Diagnosis not present

## 2023-08-23 DIAGNOSIS — Z191 Hormone sensitive malignancy status: Secondary | ICD-10-CM | POA: Diagnosis not present

## 2023-08-23 DIAGNOSIS — C61 Malignant neoplasm of prostate: Secondary | ICD-10-CM | POA: Diagnosis not present

## 2023-08-23 LAB — RAD ONC ARIA SESSION SUMMARY
Course Elapsed Days: 16
Plan Fractions Treated to Date: 13
Plan Prescribed Dose Per Fraction: 1.8 Gy
Plan Total Fractions Prescribed: 25
Plan Total Prescribed Dose: 45 Gy
Reference Point Dosage Given to Date: 23.4 Gy
Reference Point Session Dosage Given: 1.8 Gy
Session Number: 13

## 2023-08-24 ENCOUNTER — Ambulatory Visit

## 2023-08-25 ENCOUNTER — Other Ambulatory Visit: Payer: Self-pay

## 2023-08-25 ENCOUNTER — Ambulatory Visit
Admission: RE | Admit: 2023-08-25 | Discharge: 2023-08-25 | Disposition: A | Discharge status: Home / Self Care | Source: Ambulatory Visit | Attending: Radiation Oncology | Admitting: Radiation Oncology

## 2023-08-25 DIAGNOSIS — Z510 Encounter for antineoplastic radiation therapy: Secondary | ICD-10-CM | POA: Diagnosis not present

## 2023-08-25 DIAGNOSIS — Z191 Hormone sensitive malignancy status: Secondary | ICD-10-CM | POA: Diagnosis not present

## 2023-08-25 DIAGNOSIS — C61 Malignant neoplasm of prostate: Secondary | ICD-10-CM | POA: Diagnosis not present

## 2023-08-25 LAB — RAD ONC ARIA SESSION SUMMARY
Course Elapsed Days: 18
Plan Fractions Treated to Date: 14
Plan Prescribed Dose Per Fraction: 1.8 Gy
Plan Total Fractions Prescribed: 25
Plan Total Prescribed Dose: 45 Gy
Reference Point Dosage Given to Date: 25.2 Gy
Reference Point Session Dosage Given: 1.8 Gy
Session Number: 14

## 2023-08-27 DIAGNOSIS — I10 Essential (primary) hypertension: Secondary | ICD-10-CM | POA: Diagnosis not present

## 2023-08-28 ENCOUNTER — Other Ambulatory Visit: Payer: Self-pay

## 2023-08-28 ENCOUNTER — Ambulatory Visit
Admission: RE | Admit: 2023-08-28 | Discharge: 2023-08-28 | Disposition: A | Discharge status: Home / Self Care | Source: Ambulatory Visit | Attending: Radiation Oncology | Admitting: Radiation Oncology

## 2023-08-28 DIAGNOSIS — Z191 Hormone sensitive malignancy status: Secondary | ICD-10-CM | POA: Diagnosis not present

## 2023-08-28 DIAGNOSIS — C61 Malignant neoplasm of prostate: Secondary | ICD-10-CM | POA: Diagnosis not present

## 2023-08-28 DIAGNOSIS — Z510 Encounter for antineoplastic radiation therapy: Secondary | ICD-10-CM | POA: Diagnosis not present

## 2023-08-28 LAB — RAD ONC ARIA SESSION SUMMARY
Course Elapsed Days: 21
Plan Fractions Treated to Date: 15
Plan Prescribed Dose Per Fraction: 1.8 Gy
Plan Total Fractions Prescribed: 25
Plan Total Prescribed Dose: 45 Gy
Reference Point Dosage Given to Date: 27 Gy
Reference Point Session Dosage Given: 1.8 Gy
Session Number: 15

## 2023-08-29 ENCOUNTER — Other Ambulatory Visit: Payer: Self-pay

## 2023-08-29 ENCOUNTER — Ambulatory Visit
Admission: RE | Admit: 2023-08-29 | Discharge: 2023-08-29 | Disposition: A | Discharge status: Home / Self Care | Source: Ambulatory Visit | Attending: Radiation Oncology | Admitting: Radiation Oncology

## 2023-08-29 DIAGNOSIS — C61 Malignant neoplasm of prostate: Secondary | ICD-10-CM | POA: Diagnosis not present

## 2023-08-29 DIAGNOSIS — Z191 Hormone sensitive malignancy status: Secondary | ICD-10-CM | POA: Diagnosis not present

## 2023-08-29 DIAGNOSIS — Z510 Encounter for antineoplastic radiation therapy: Secondary | ICD-10-CM | POA: Diagnosis not present

## 2023-08-29 LAB — RAD ONC ARIA SESSION SUMMARY
Course Elapsed Days: 22
Plan Fractions Treated to Date: 16
Plan Prescribed Dose Per Fraction: 1.8 Gy
Plan Total Fractions Prescribed: 25
Plan Total Prescribed Dose: 45 Gy
Reference Point Dosage Given to Date: 28.8 Gy
Reference Point Session Dosage Given: 1.8 Gy
Session Number: 16

## 2023-08-30 ENCOUNTER — Ambulatory Visit
Admission: RE | Admit: 2023-08-30 | Discharge: 2023-08-30 | Disposition: A | Discharge status: Home / Self Care | Source: Ambulatory Visit | Attending: Radiation Oncology

## 2023-08-30 ENCOUNTER — Other Ambulatory Visit: Payer: Self-pay

## 2023-08-30 DIAGNOSIS — C61 Malignant neoplasm of prostate: Secondary | ICD-10-CM | POA: Diagnosis not present

## 2023-08-30 DIAGNOSIS — Z510 Encounter for antineoplastic radiation therapy: Secondary | ICD-10-CM | POA: Diagnosis not present

## 2023-08-30 DIAGNOSIS — Z191 Hormone sensitive malignancy status: Secondary | ICD-10-CM | POA: Diagnosis not present

## 2023-08-30 LAB — RAD ONC ARIA SESSION SUMMARY
Course Elapsed Days: 23
Plan Fractions Treated to Date: 17
Plan Prescribed Dose Per Fraction: 1.8 Gy
Plan Total Fractions Prescribed: 25
Plan Total Prescribed Dose: 45 Gy
Reference Point Dosage Given to Date: 30.6 Gy
Reference Point Session Dosage Given: 1.8 Gy
Session Number: 17

## 2023-08-31 ENCOUNTER — Ambulatory Visit
Admission: RE | Admit: 2023-08-31 | Discharge: 2023-08-31 | Disposition: A | Discharge status: Home / Self Care | Source: Ambulatory Visit | Attending: Radiation Oncology | Admitting: Radiation Oncology

## 2023-08-31 ENCOUNTER — Other Ambulatory Visit: Payer: Self-pay

## 2023-08-31 DIAGNOSIS — C61 Malignant neoplasm of prostate: Secondary | ICD-10-CM | POA: Diagnosis present

## 2023-08-31 DIAGNOSIS — Z510 Encounter for antineoplastic radiation therapy: Secondary | ICD-10-CM | POA: Diagnosis not present

## 2023-08-31 DIAGNOSIS — Z191 Hormone sensitive malignancy status: Secondary | ICD-10-CM | POA: Diagnosis not present

## 2023-08-31 DIAGNOSIS — R339 Retention of urine, unspecified: Secondary | ICD-10-CM | POA: Diagnosis not present

## 2023-08-31 LAB — RAD ONC ARIA SESSION SUMMARY
Course Elapsed Days: 24
Plan Fractions Treated to Date: 18
Plan Prescribed Dose Per Fraction: 1.8 Gy
Plan Total Fractions Prescribed: 25
Plan Total Prescribed Dose: 45 Gy
Reference Point Dosage Given to Date: 32.4 Gy
Reference Point Session Dosage Given: 1.8 Gy
Session Number: 18

## 2023-09-01 ENCOUNTER — Ambulatory Visit
Admission: RE | Admit: 2023-09-01 | Discharge: 2023-09-01 | Disposition: A | Discharge status: Home / Self Care | Source: Ambulatory Visit | Attending: Radiation Oncology | Admitting: Radiation Oncology

## 2023-09-01 ENCOUNTER — Ambulatory Visit
Admission: RE | Admit: 2023-09-01 | Discharge: 2023-09-01 | Disposition: A | Discharge status: Home / Self Care | Source: Ambulatory Visit | Attending: Radiation Oncology

## 2023-09-01 ENCOUNTER — Other Ambulatory Visit: Payer: Self-pay

## 2023-09-01 DIAGNOSIS — Z510 Encounter for antineoplastic radiation therapy: Secondary | ICD-10-CM | POA: Diagnosis not present

## 2023-09-01 DIAGNOSIS — C61 Malignant neoplasm of prostate: Secondary | ICD-10-CM | POA: Diagnosis not present

## 2023-09-01 DIAGNOSIS — Z191 Hormone sensitive malignancy status: Secondary | ICD-10-CM | POA: Diagnosis not present

## 2023-09-01 DIAGNOSIS — R339 Retention of urine, unspecified: Secondary | ICD-10-CM | POA: Diagnosis not present

## 2023-09-01 LAB — RAD ONC ARIA SESSION SUMMARY
Course Elapsed Days: 25
Plan Fractions Treated to Date: 19
Plan Prescribed Dose Per Fraction: 1.8 Gy
Plan Total Fractions Prescribed: 25
Plan Total Prescribed Dose: 45 Gy
Reference Point Dosage Given to Date: 34.2 Gy
Reference Point Session Dosage Given: 1.8 Gy
Session Number: 19

## 2023-09-04 ENCOUNTER — Ambulatory Visit
Admission: RE | Admit: 2023-09-04 | Discharge: 2023-09-04 | Disposition: A | Discharge status: Home / Self Care | Source: Ambulatory Visit | Attending: Radiation Oncology | Admitting: Radiation Oncology

## 2023-09-04 ENCOUNTER — Other Ambulatory Visit: Payer: Self-pay

## 2023-09-04 DIAGNOSIS — Z191 Hormone sensitive malignancy status: Secondary | ICD-10-CM | POA: Diagnosis not present

## 2023-09-04 DIAGNOSIS — C61 Malignant neoplasm of prostate: Secondary | ICD-10-CM | POA: Diagnosis not present

## 2023-09-04 DIAGNOSIS — Z510 Encounter for antineoplastic radiation therapy: Secondary | ICD-10-CM | POA: Diagnosis not present

## 2023-09-04 DIAGNOSIS — R339 Retention of urine, unspecified: Secondary | ICD-10-CM | POA: Diagnosis not present

## 2023-09-04 LAB — RAD ONC ARIA SESSION SUMMARY
Course Elapsed Days: 28
Plan Fractions Treated to Date: 20
Plan Prescribed Dose Per Fraction: 1.8 Gy
Plan Total Fractions Prescribed: 25
Plan Total Prescribed Dose: 45 Gy
Reference Point Dosage Given to Date: 36 Gy
Reference Point Session Dosage Given: 1.8 Gy
Session Number: 20

## 2023-09-05 ENCOUNTER — Other Ambulatory Visit: Payer: Self-pay

## 2023-09-05 ENCOUNTER — Ambulatory Visit
Admission: RE | Admit: 2023-09-05 | Discharge: 2023-09-05 | Disposition: A | Discharge status: Home / Self Care | Source: Ambulatory Visit | Attending: Radiation Oncology

## 2023-09-05 DIAGNOSIS — Z510 Encounter for antineoplastic radiation therapy: Secondary | ICD-10-CM | POA: Diagnosis not present

## 2023-09-05 DIAGNOSIS — R339 Retention of urine, unspecified: Secondary | ICD-10-CM | POA: Diagnosis not present

## 2023-09-05 DIAGNOSIS — C61 Malignant neoplasm of prostate: Secondary | ICD-10-CM | POA: Diagnosis not present

## 2023-09-05 DIAGNOSIS — Z191 Hormone sensitive malignancy status: Secondary | ICD-10-CM | POA: Diagnosis not present

## 2023-09-05 LAB — RAD ONC ARIA SESSION SUMMARY
Course Elapsed Days: 29
Plan Fractions Treated to Date: 21
Plan Prescribed Dose Per Fraction: 1.8 Gy
Plan Total Fractions Prescribed: 25
Plan Total Prescribed Dose: 45 Gy
Reference Point Dosage Given to Date: 37.8 Gy
Reference Point Session Dosage Given: 1.8 Gy
Session Number: 21

## 2023-09-06 ENCOUNTER — Ambulatory Visit
Admission: RE | Admit: 2023-09-06 | Discharge: 2023-09-06 | Disposition: A | Discharge status: Home / Self Care | Source: Ambulatory Visit | Attending: Radiation Oncology | Admitting: Radiation Oncology

## 2023-09-06 ENCOUNTER — Other Ambulatory Visit: Payer: Self-pay

## 2023-09-06 DIAGNOSIS — Z191 Hormone sensitive malignancy status: Secondary | ICD-10-CM | POA: Diagnosis not present

## 2023-09-06 DIAGNOSIS — R339 Retention of urine, unspecified: Secondary | ICD-10-CM | POA: Diagnosis not present

## 2023-09-06 DIAGNOSIS — C61 Malignant neoplasm of prostate: Secondary | ICD-10-CM | POA: Diagnosis not present

## 2023-09-06 DIAGNOSIS — Z510 Encounter for antineoplastic radiation therapy: Secondary | ICD-10-CM | POA: Diagnosis not present

## 2023-09-06 LAB — RAD ONC ARIA SESSION SUMMARY
Course Elapsed Days: 30
Plan Fractions Treated to Date: 22
Plan Prescribed Dose Per Fraction: 1.8 Gy
Plan Total Fractions Prescribed: 25
Plan Total Prescribed Dose: 45 Gy
Reference Point Dosage Given to Date: 39.6 Gy
Reference Point Session Dosage Given: 1.8 Gy
Session Number: 22

## 2023-09-07 ENCOUNTER — Ambulatory Visit
Admission: RE | Admit: 2023-09-07 | Discharge: 2023-09-07 | Disposition: A | Discharge status: Home / Self Care | Source: Ambulatory Visit | Attending: Radiation Oncology | Admitting: Radiation Oncology

## 2023-09-07 ENCOUNTER — Other Ambulatory Visit: Payer: Self-pay

## 2023-09-07 ENCOUNTER — Ambulatory Visit: Admission: RE | Admit: 2023-09-07 | Source: Ambulatory Visit

## 2023-09-07 DIAGNOSIS — Z191 Hormone sensitive malignancy status: Secondary | ICD-10-CM | POA: Diagnosis not present

## 2023-09-07 DIAGNOSIS — C61 Malignant neoplasm of prostate: Secondary | ICD-10-CM | POA: Diagnosis not present

## 2023-09-07 DIAGNOSIS — R339 Retention of urine, unspecified: Secondary | ICD-10-CM | POA: Diagnosis not present

## 2023-09-07 DIAGNOSIS — Z510 Encounter for antineoplastic radiation therapy: Secondary | ICD-10-CM | POA: Diagnosis not present

## 2023-09-07 LAB — RAD ONC ARIA SESSION SUMMARY
Course Elapsed Days: 31
Plan Fractions Treated to Date: 23
Plan Prescribed Dose Per Fraction: 1.8 Gy
Plan Total Fractions Prescribed: 25
Plan Total Prescribed Dose: 45 Gy
Reference Point Dosage Given to Date: 41.4 Gy
Reference Point Session Dosage Given: 1.8 Gy
Session Number: 23

## 2023-09-08 ENCOUNTER — Other Ambulatory Visit: Payer: Self-pay

## 2023-09-08 ENCOUNTER — Ambulatory Visit
Admission: RE | Admit: 2023-09-08 | Discharge: 2023-09-08 | Disposition: A | Discharge status: Home / Self Care | Source: Ambulatory Visit | Attending: Radiation Oncology | Admitting: Radiation Oncology

## 2023-09-08 DIAGNOSIS — I639 Cerebral infarction, unspecified: Secondary | ICD-10-CM | POA: Diagnosis not present

## 2023-09-08 DIAGNOSIS — E785 Hyperlipidemia, unspecified: Secondary | ICD-10-CM | POA: Diagnosis not present

## 2023-09-08 DIAGNOSIS — I10 Essential (primary) hypertension: Secondary | ICD-10-CM | POA: Diagnosis not present

## 2023-09-08 DIAGNOSIS — Z191 Hormone sensitive malignancy status: Secondary | ICD-10-CM | POA: Diagnosis not present

## 2023-09-08 DIAGNOSIS — C61 Malignant neoplasm of prostate: Secondary | ICD-10-CM | POA: Diagnosis not present

## 2023-09-08 DIAGNOSIS — R339 Retention of urine, unspecified: Secondary | ICD-10-CM | POA: Diagnosis not present

## 2023-09-08 DIAGNOSIS — Z510 Encounter for antineoplastic radiation therapy: Secondary | ICD-10-CM | POA: Diagnosis not present

## 2023-09-08 LAB — RAD ONC ARIA SESSION SUMMARY
Course Elapsed Days: 32
Plan Fractions Treated to Date: 24
Plan Prescribed Dose Per Fraction: 1.8 Gy
Plan Total Fractions Prescribed: 25
Plan Total Prescribed Dose: 45 Gy
Reference Point Dosage Given to Date: 43.2 Gy
Reference Point Session Dosage Given: 1.8 Gy
Session Number: 24

## 2023-09-11 ENCOUNTER — Ambulatory Visit
Admission: RE | Admit: 2023-09-11 | Discharge: 2023-09-11 | Disposition: A | Discharge status: Home / Self Care | Source: Ambulatory Visit | Attending: Radiation Oncology

## 2023-09-11 ENCOUNTER — Other Ambulatory Visit: Payer: Self-pay

## 2023-09-11 DIAGNOSIS — C61 Malignant neoplasm of prostate: Secondary | ICD-10-CM | POA: Diagnosis not present

## 2023-09-11 DIAGNOSIS — Z191 Hormone sensitive malignancy status: Secondary | ICD-10-CM | POA: Diagnosis not present

## 2023-09-11 DIAGNOSIS — R339 Retention of urine, unspecified: Secondary | ICD-10-CM | POA: Diagnosis not present

## 2023-09-11 DIAGNOSIS — Z510 Encounter for antineoplastic radiation therapy: Secondary | ICD-10-CM | POA: Diagnosis not present

## 2023-09-11 LAB — RAD ONC ARIA SESSION SUMMARY
Course Elapsed Days: 35
Plan Fractions Treated to Date: 25
Plan Prescribed Dose Per Fraction: 1.8 Gy
Plan Total Fractions Prescribed: 25
Plan Total Prescribed Dose: 45 Gy
Reference Point Dosage Given to Date: 45 Gy
Reference Point Session Dosage Given: 1.8 Gy
Session Number: 25

## 2023-09-12 ENCOUNTER — Other Ambulatory Visit: Payer: Self-pay

## 2023-09-12 ENCOUNTER — Ambulatory Visit
Admission: RE | Admit: 2023-09-12 | Discharge: 2023-09-12 | Disposition: A | Discharge status: Home / Self Care | Source: Ambulatory Visit | Attending: Radiation Oncology

## 2023-09-12 DIAGNOSIS — Z510 Encounter for antineoplastic radiation therapy: Secondary | ICD-10-CM | POA: Diagnosis not present

## 2023-09-12 DIAGNOSIS — Z191 Hormone sensitive malignancy status: Secondary | ICD-10-CM | POA: Diagnosis not present

## 2023-09-12 DIAGNOSIS — C61 Malignant neoplasm of prostate: Secondary | ICD-10-CM | POA: Diagnosis not present

## 2023-09-12 DIAGNOSIS — R339 Retention of urine, unspecified: Secondary | ICD-10-CM | POA: Diagnosis not present

## 2023-09-12 LAB — RAD ONC ARIA SESSION SUMMARY
Course Elapsed Days: 36
Plan Fractions Treated to Date: 1
Plan Prescribed Dose Per Fraction: 1.8 Gy
Plan Total Fractions Prescribed: 13
Plan Total Prescribed Dose: 23.4 Gy
Reference Point Dosage Given to Date: 1.8 Gy
Reference Point Session Dosage Given: 1.8 Gy
Session Number: 26

## 2023-09-13 ENCOUNTER — Other Ambulatory Visit: Payer: Self-pay

## 2023-09-13 ENCOUNTER — Ambulatory Visit
Admission: RE | Admit: 2023-09-13 | Discharge: 2023-09-13 | Disposition: A | Discharge status: Home / Self Care | Source: Ambulatory Visit | Attending: Radiation Oncology | Admitting: Radiation Oncology

## 2023-09-13 ENCOUNTER — Ambulatory Visit

## 2023-09-13 ENCOUNTER — Ambulatory Visit
Admission: RE | Admit: 2023-09-13 | Discharge: 2023-09-13 | Disposition: A | Discharge status: Home / Self Care | Source: Ambulatory Visit | Attending: Radiation Oncology

## 2023-09-13 DIAGNOSIS — C61 Malignant neoplasm of prostate: Secondary | ICD-10-CM

## 2023-09-13 DIAGNOSIS — Z510 Encounter for antineoplastic radiation therapy: Secondary | ICD-10-CM | POA: Diagnosis not present

## 2023-09-13 DIAGNOSIS — E119 Type 2 diabetes mellitus without complications: Secondary | ICD-10-CM | POA: Diagnosis not present

## 2023-09-13 DIAGNOSIS — R339 Retention of urine, unspecified: Secondary | ICD-10-CM | POA: Diagnosis not present

## 2023-09-13 DIAGNOSIS — R300 Dysuria: Secondary | ICD-10-CM | POA: Diagnosis not present

## 2023-09-13 DIAGNOSIS — Z191 Hormone sensitive malignancy status: Secondary | ICD-10-CM | POA: Diagnosis not present

## 2023-09-13 LAB — RAD ONC ARIA SESSION SUMMARY
Course Elapsed Days: 37
Plan Fractions Treated to Date: 2
Plan Prescribed Dose Per Fraction: 1.8 Gy
Plan Total Fractions Prescribed: 13
Plan Total Prescribed Dose: 23.4 Gy
Reference Point Dosage Given to Date: 3.6 Gy
Reference Point Session Dosage Given: 1.8 Gy
Session Number: 27

## 2023-09-14 ENCOUNTER — Ambulatory Visit
Admission: RE | Admit: 2023-09-14 | Discharge: 2023-09-14 | Disposition: A | Discharge status: Home / Self Care | Source: Ambulatory Visit | Attending: Radiation Oncology | Admitting: Radiation Oncology

## 2023-09-14 ENCOUNTER — Other Ambulatory Visit: Payer: Self-pay

## 2023-09-14 DIAGNOSIS — C61 Malignant neoplasm of prostate: Secondary | ICD-10-CM | POA: Diagnosis not present

## 2023-09-14 DIAGNOSIS — Z191 Hormone sensitive malignancy status: Secondary | ICD-10-CM | POA: Diagnosis not present

## 2023-09-14 DIAGNOSIS — Z510 Encounter for antineoplastic radiation therapy: Secondary | ICD-10-CM | POA: Diagnosis not present

## 2023-09-14 DIAGNOSIS — R339 Retention of urine, unspecified: Secondary | ICD-10-CM | POA: Diagnosis not present

## 2023-09-14 LAB — RAD ONC ARIA SESSION SUMMARY
Course Elapsed Days: 38
Plan Fractions Treated to Date: 3
Plan Prescribed Dose Per Fraction: 1.8 Gy
Plan Total Fractions Prescribed: 13
Plan Total Prescribed Dose: 23.4 Gy
Reference Point Dosage Given to Date: 5.4 Gy
Reference Point Session Dosage Given: 1.8 Gy
Session Number: 28

## 2023-09-15 ENCOUNTER — Other Ambulatory Visit: Payer: Self-pay

## 2023-09-15 ENCOUNTER — Ambulatory Visit
Admission: RE | Admit: 2023-09-15 | Discharge: 2023-09-15 | Disposition: A | Discharge status: Home / Self Care | Source: Ambulatory Visit | Attending: Radiation Oncology | Admitting: Radiation Oncology

## 2023-09-15 DIAGNOSIS — Z510 Encounter for antineoplastic radiation therapy: Secondary | ICD-10-CM | POA: Diagnosis not present

## 2023-09-15 DIAGNOSIS — R338 Other retention of urine: Secondary | ICD-10-CM | POA: Diagnosis not present

## 2023-09-15 DIAGNOSIS — R339 Retention of urine, unspecified: Secondary | ICD-10-CM | POA: Diagnosis not present

## 2023-09-15 DIAGNOSIS — Z191 Hormone sensitive malignancy status: Secondary | ICD-10-CM | POA: Diagnosis not present

## 2023-09-15 DIAGNOSIS — C61 Malignant neoplasm of prostate: Secondary | ICD-10-CM | POA: Diagnosis not present

## 2023-09-15 LAB — RAD ONC ARIA SESSION SUMMARY
Course Elapsed Days: 39
Plan Fractions Treated to Date: 4
Plan Prescribed Dose Per Fraction: 1.8 Gy
Plan Total Fractions Prescribed: 13
Plan Total Prescribed Dose: 23.4 Gy
Reference Point Dosage Given to Date: 7.2 Gy
Reference Point Session Dosage Given: 1.8 Gy
Session Number: 29

## 2023-09-18 ENCOUNTER — Emergency Department (HOSPITAL_COMMUNITY)
Admission: EM | Admit: 2023-09-18 | Discharge: 2023-09-18 | Disposition: A | Discharge status: Home / Self Care | Attending: Emergency Medicine | Admitting: Emergency Medicine

## 2023-09-18 ENCOUNTER — Ambulatory Visit

## 2023-09-18 ENCOUNTER — Encounter (HOSPITAL_COMMUNITY): Payer: Self-pay | Admitting: Emergency Medicine

## 2023-09-18 ENCOUNTER — Emergency Department (HOSPITAL_COMMUNITY)

## 2023-09-18 ENCOUNTER — Other Ambulatory Visit: Payer: Self-pay

## 2023-09-18 DIAGNOSIS — K2970 Gastritis, unspecified, without bleeding: Secondary | ICD-10-CM | POA: Diagnosis not present

## 2023-09-18 DIAGNOSIS — Z7982 Long term (current) use of aspirin: Secondary | ICD-10-CM | POA: Diagnosis not present

## 2023-09-18 DIAGNOSIS — R109 Unspecified abdominal pain: Secondary | ICD-10-CM | POA: Diagnosis not present

## 2023-09-18 DIAGNOSIS — F0390 Unspecified dementia without behavioral disturbance: Secondary | ICD-10-CM | POA: Diagnosis not present

## 2023-09-18 DIAGNOSIS — R000 Tachycardia, unspecified: Secondary | ICD-10-CM | POA: Diagnosis not present

## 2023-09-18 DIAGNOSIS — Z7401 Bed confinement status: Secondary | ICD-10-CM | POA: Diagnosis not present

## 2023-09-18 DIAGNOSIS — R404 Transient alteration of awareness: Secondary | ICD-10-CM | POA: Diagnosis not present

## 2023-09-18 DIAGNOSIS — K439 Ventral hernia without obstruction or gangrene: Secondary | ICD-10-CM | POA: Diagnosis not present

## 2023-09-18 DIAGNOSIS — Z743 Need for continuous supervision: Secondary | ICD-10-CM | POA: Diagnosis not present

## 2023-09-18 DIAGNOSIS — R112 Nausea with vomiting, unspecified: Secondary | ICD-10-CM | POA: Diagnosis not present

## 2023-09-18 DIAGNOSIS — I499 Cardiac arrhythmia, unspecified: Secondary | ICD-10-CM | POA: Diagnosis not present

## 2023-09-18 DIAGNOSIS — E119 Type 2 diabetes mellitus without complications: Secondary | ICD-10-CM | POA: Diagnosis not present

## 2023-09-18 DIAGNOSIS — I10 Essential (primary) hypertension: Secondary | ICD-10-CM | POA: Insufficient documentation

## 2023-09-18 DIAGNOSIS — R140 Abdominal distension (gaseous): Secondary | ICD-10-CM | POA: Diagnosis not present

## 2023-09-18 DIAGNOSIS — N3000 Acute cystitis without hematuria: Secondary | ICD-10-CM | POA: Insufficient documentation

## 2023-09-18 DIAGNOSIS — K5730 Diverticulosis of large intestine without perforation or abscess without bleeding: Secondary | ICD-10-CM | POA: Diagnosis not present

## 2023-09-18 LAB — URINALYSIS, ROUTINE W REFLEX MICROSCOPIC
Bilirubin Urine: NEGATIVE
Glucose, UA: NEGATIVE mg/dL
Ketones, ur: NEGATIVE mg/dL
Leukocytes,Ua: NEGATIVE
Nitrite: POSITIVE — AB
Protein, ur: 100 mg/dL — AB
RBC / HPF: >50 RBC/hpf (ref 0–5)
Specific Gravity, Urine: 1.017 (ref 1.005–1.030)
pH: 5 (ref 5.0–8.0)

## 2023-09-18 LAB — CBC WITH DIFFERENTIAL/PLATELET
Abs Immature Granulocytes: 0.13 10*3/uL — ABNORMAL HIGH (ref 0.00–0.07)
Basophils Absolute: 0 10*3/uL (ref 0.0–0.1)
Basophils Relative: 1 %
Eosinophils Absolute: 0.2 10*3/uL (ref 0.0–0.5)
Eosinophils Relative: 2 %
HCT: 35.8 % — ABNORMAL LOW (ref 39.0–52.0)
Hemoglobin: 11.9 g/dL — ABNORMAL LOW (ref 13.0–17.0)
Immature Granulocytes: 2 %
Lymphocytes Relative: 8 %
Lymphs Abs: 0.7 10*3/uL (ref 0.7–4.0)
MCH: 28.5 pg (ref 26.0–34.0)
MCHC: 33.2 g/dL (ref 30.0–36.0)
MCV: 85.6 fL (ref 80.0–100.0)
Monocytes Absolute: 0.6 10*3/uL (ref 0.1–1.0)
Monocytes Relative: 7 %
Neutro Abs: 7.1 10*3/uL (ref 1.7–7.7)
Neutrophils Relative %: 80 %
Platelets: 142 10*3/uL — ABNORMAL LOW (ref 150–400)
RBC: 4.18 MIL/uL — ABNORMAL LOW (ref 4.22–5.81)
RDW: 15.9 % — ABNORMAL HIGH (ref 11.5–15.5)
WBC: 8.8 10*3/uL (ref 4.0–10.5)
nRBC: 0 % (ref 0.0–0.2)

## 2023-09-18 LAB — COMPREHENSIVE METABOLIC PANEL WITH GFR
ALT: 14 U/L (ref 0–44)
AST: 19 U/L (ref 15–41)
Albumin: 3.2 g/dL — ABNORMAL LOW (ref 3.5–5.0)
Alkaline Phosphatase: 65 U/L (ref 38–126)
Anion gap: 15 (ref 5–15)
BUN: 9 mg/dL (ref 8–23)
CO2: 21 mmol/L — ABNORMAL LOW (ref 22–32)
Calcium: 6.9 mg/dL — ABNORMAL LOW (ref 8.9–10.3)
Chloride: 104 mmol/L (ref 98–111)
Creatinine, Ser: 0.98 mg/dL (ref 0.61–1.24)
GFR, Estimated: >60 mL/min (ref >60)
Glucose, Bld: 178 mg/dL — ABNORMAL HIGH (ref 70–99)
Potassium: 3.4 mmol/L — ABNORMAL LOW (ref 3.5–5.1)
Sodium: 140 mmol/L (ref 135–145)
Total Bilirubin: 0.7 mg/dL (ref 0.0–1.2)
Total Protein: 7.1 g/dL (ref 6.5–8.1)

## 2023-09-18 LAB — LIPASE, BLOOD: Lipase: 58 U/L — ABNORMAL HIGH (ref 11–51)

## 2023-09-18 LAB — TROPONIN I (HIGH SENSITIVITY): Troponin I (High Sensitivity): 7 ng/L (ref <18)

## 2023-09-18 MED ORDER — FENTANYL CITRATE PF 50 MCG/ML IJ SOSY
50.0000 ug | PREFILLED_SYRINGE | Freq: Once | INTRAMUSCULAR | Status: AC
  Administered 2023-09-18: 50 ug via INTRAVENOUS
  Filled 2023-09-18: qty 1

## 2023-09-18 MED ORDER — ACETAMINOPHEN 325 MG PO TABS
650.0000 mg | ORAL_TABLET | Freq: Once | ORAL | Status: AC
  Administered 2023-09-18: 650 mg via ORAL
  Filled 2023-09-18: qty 2

## 2023-09-18 MED ORDER — IOHEXOL 350 MG/ML SOLN
75.0000 mL | Freq: Once | INTRAVENOUS | Status: AC | PRN
  Administered 2023-09-18: 75 mL via INTRAVENOUS

## 2023-09-18 MED ORDER — SODIUM CHLORIDE 0.9 % IV SOLN
1.0000 g | Freq: Once | INTRAVENOUS | Status: AC
  Administered 2023-09-18: 1 g via INTRAVENOUS
  Filled 2023-09-18: qty 10

## 2023-09-18 MED ORDER — ONDANSETRON HCL 4 MG/2ML IJ SOLN
4.0000 mg | Freq: Once | INTRAMUSCULAR | Status: AC
  Administered 2023-09-18: 4 mg via INTRAVENOUS
  Filled 2023-09-18: qty 2

## 2023-09-18 MED ORDER — CEPHALEXIN 500 MG PO CAPS: 500.0000 mg | ORAL_CAPSULE | Freq: Four times a day (QID) | ORAL | 0 refills | Status: AC

## 2023-09-18 NOTE — ED Notes (Signed)
 While this paramedic was in another patient's room pt apparently stood at the doorway, ripping his IV out. He states he was seeing what all "the commotion was and telling them to stop it." Pt was advised he can not get out of bed. Pt is aox3 at this time

## 2023-09-18 NOTE — ED Notes (Signed)
 Pt placed on 2L [place]. O2 88% after fentanyl 

## 2023-09-18 NOTE — ED Provider Notes (Signed)
 [facility] AT [facility] Provider Note   CSN: [id number] Arrival date & time: 09/18/23  1330     History  Chief Complaint  Patient presents with   Abdominal Pain    Jacob [name] is a 68 y.o. male.  Patient is a 68 year old male with a PMH DM, prior CVA, HTN, depression, dementia presenting to the ED with abdominal pain. Patient reports that his pain started when he woke up this morning.  He states it is mostly across his lower abdomen and has been constant since it started.  He states he feels nauseous but has not had any vomiting.  Denies any diarrhea or constipation.  He states that he had a Foley catheter placed few days ago and has not noticed any blood in his urine and does still appear to be draining normally.  He states that his nursing home did recently start him on Bactrim  for antibiotics.  He denies any fevers.  I spoke with his nursing home who states that the Foley was placed on Friday when he was complaining of urinary urgency and frequency and that his bladder appeared large on ultrasound however when the catheter was placed not a large amount of urine came out.  They state that he did have some intermittent pain over the last few days but the pain significantly worsened this morning where he was crying and pain prompted them to send him to the emergency department.  The history is provided by the patient and the nursing home.  Abdominal Pain      Home Medications Prior to Admission medications   Medication Sig Start Date End Date Taking? Authorizing Provider  cephALEXin  (KEFLEX ) 500 MG capsule Take 1 capsule (500 mg total) by mouth 4 (four) times daily for 7 days. 09/18/23 09/25/23 Yes Kingsley, Norberta Stobaugh K, DO  amLODipine  (NORVASC ) 10 MG tablet TAKE 1 TABLET BY MOUTH EVERY DAY 05/25/23   Lavada Porteous, DO  aspirin  81 MG chewable tablet Chew 1 tablet (81 mg total) by mouth daily. 04/07/23   Jonne Netters, MD  atorvastatin  (LIPITOR ) 80  MG tablet Take 1 tablet (80 mg total) by mouth daily. 04/07/23   Jonne Netters, MD  venlafaxine  XR (EFFEXOR -XR) 37.5 MG 24 hr capsule TAKE 1 CAPSULE BY MOUTH DAILY WITH BREAKFAST. 03/22/23   Lavada Porteous, DO      Allergies    Methylprednisolone sodium succ and Ms contin  [morphine ]    Review of Systems   Review of Systems  Gastrointestinal:  Positive for abdominal pain.    Physical Exam Updated Vital Signs BP (!) 147/80   Pulse 85   Temp 98.9 F (37.2 C) (Oral)   Resp 16   SpO2 93%  Physical Exam Vitals and nursing note reviewed.  Constitutional:      General: [name] not in acute distress.    Appearance: [name] well-developed. [name] obese.  HENT:     Head: Normocephalic and atraumatic.     Mouth/Throat:     Mouth: Mucous membranes are moist.  Eyes:     Extraocular Movements: Extraocular movements intact.  Cardiovascular:     Rate and Rhythm: Normal rate and regular rhythm.  Pulmonary:     Effort: Pulmonary effort is normal.     Breath sounds: Normal breath sounds.  Abdominal:     General: Abdomen is flat.     Palpations: Abdomen is soft.     Tenderness: There is abdominal tenderness (mild, diffuse). There is  no guarding or rebound.  Genitourinary:    Comments: Foley catheter in place with urine draining into bag Skin:    General: Skin is warm and dry.  Neurological:     General: No focal deficit present.     Mental Status: [name] alert and oriented to person, place, and time.  Psychiatric:        Mood and Affect: Mood normal.        Behavior: Behavior normal.     ED Results / Procedures / Treatments   Labs (all labs ordered are listed, but only abnormal results are displayed) Labs Reviewed  COMPREHENSIVE METABOLIC PANEL WITH GFR - Abnormal; Notable for the following components:      Result Value   Potassium 3.4 (*)    CO2 21 (*)    Glucose, Bld 178 (*)    Calcium  6.9 (*)    Albumin  3.2 (*)    All other components within normal limits  LIPASE, BLOOD -  Abnormal; Notable for the following components:   Lipase 58 (*)    All other components within normal limits  CBC WITH DIFFERENTIAL/PLATELET - Abnormal; Notable for the following components:   RBC 4.18 (*)    Hemoglobin 11.9 (*)    HCT 35.8 (*)    RDW 15.9 (*)    Platelets 142 (*)    Abs Immature Granulocytes 0.13 (*)    All other components within normal limits  URINALYSIS, ROUTINE W REFLEX MICROSCOPIC - Abnormal; Notable for the following components:   Color, Urine AMBER (*)    APPearance CLOUDY (*)    Hgb urine dipstick MODERATE (*)    Protein, ur 100 (*)    Nitrite POSITIVE (*)    Bacteria, UA RARE (*)    All other components within normal limits  URINE CULTURE  TROPONIN I (HIGH SENSITIVITY)    EKG EKG Interpretation Date/Time:  Monday Sep 18 2023 13:39:33 EDT Ventricular Rate:  109 PR Interval:  136 QRS Duration:  84 QT Interval:  362 QTC Calculation: 487 R Axis:   16  Text Interpretation: Sinus tachycardia with Premature supraventricular complexes ST & T wave abnormality, consider lateral ischemia Abnormal ECG When compared with ECG of 08-Apr-2023 09:23, PREVIOUS ECG IS PRESENT Confirmed by Celesta Coke ([id number]) on 09/18/2023 3:02:10 PM  Radiology CT ABDOMEN PELVIS W CONTRAST Result Date: 09/18/2023 CLINICAL DATA:  Abdomen pain EXAM: CT ABDOMEN AND PELVIS WITH CONTRAST TECHNIQUE: Multidetector CT imaging of the abdomen and pelvis was performed using the standard protocol following bolus administration of intravenous contrast. RADIATION DOSE REDUCTION: This exam was performed according to the departmental dose-optimization program which includes automated exposure control, adjustment of the mA and/or kV according to patient size and/or use of iterative reconstruction technique. CONTRAST:  75mL OMNIPAQUE  IOHEXOL  350 MG/ML SOLN COMPARISON:  04/29/2022 FINDINGS: Lower chest: Stable calcified lung nodule at the right base. Chronic scarring or atelectasis at the left base.  Coronary vascular calcification Hepatobiliary: No focal liver abnormality is seen. Status post cholecystectomy. No biliary dilatation. Pancreas: Unremarkable. No pancreatic ductal dilatation or surrounding inflammatory changes. Spleen: Normal in size without focal abnormality. Adrenals/Urinary Tract: Adrenal glands are normal. Kidneys show no hydronephrosis. Foley catheter in the bladder. Thick-walled decompressed bladder with perivesical stranding. Stomach/Bowel: Stomach nonenlarged. No dilated small bowel. No acute bowel wall thickening. Diverticular disease of the left colon. Vascular/Lymphatic: Aortic atherosclerosis. No enlarged abdominal or pelvic lymph nodes. Reproductive: Prostatectomy Other: Negative for pelvic effusion or free air. Mild presacral stranding likely due to  post treatment change. Small fat containing ventral hernia Musculoskeletal: No acute or suspicious osseous abnormality. IMPRESSION: 1. Foley catheter in the bladder. Thick-walled decompressed bladder with perivesical stranding, which could be due to cystitis or post treatment change. 2. Diverticular disease of the left colon without acute inflammatory process. 3. Aortic atherosclerosis. Aortic Atherosclerosis (ICD10-I70.0). Electronically Signed   By: Esmeralda Hedge M.D.   On: 09/18/2023 18:10    Procedures Procedures    Medications Ordered in ED Medications  fentaNYL  (SUBLIMAZE ) injection 50 mcg (50 mcg Intravenous Given 09/18/23 1526)  ondansetron  (ZOFRAN ) injection 4 mg (4 mg Intravenous Given 09/18/23 1526)  cefTRIAXone  (ROCEPHIN ) 1 g in sodium chloride  0.9 % 100 mL IVPB (0 g Intravenous Stopped 09/18/23 1735)  iohexol  (OMNIPAQUE ) 350 MG/ML injection 75 mL (75 mLs Intravenous Contrast Given 09/18/23 1629)  ondansetron  (ZOFRAN ) injection 4 mg (4 mg Intravenous Given 09/18/23 2055)  acetaminophen  (TYLENOL ) tablet 650 mg (650 mg Oral Given 09/18/23 2055)    ED Course/ Medical Decision Making/ A&P Clinical Course as of 09/18/23  2121  Mon Sep 18, 2023  1907 CTAP with findings of cystitis, no other complication. Patient will be given keflex  over bactrim  without improvement on the bactrim  pending culture here but is otherwise stable for discharge back to the NH. [VK]    Clinical Course User Index [VK] Kingsley, Carley Glendenning K, DO                                 Medical Decision Making This patient presents to the ED with chief complaint(s) of abdominal pain with pertinent past medical history of CVA, HTN, DM, dementia, depression, recent UTI with foley catheter placed which further complicates the presenting complaint. The complaint involves an extensive differential diagnosis and also carries with it a high risk of complications and morbidity.    The differential diagnosis includes UTI, pyelonephritis, diverticulitis, other intra-abdominal infection, no signs of sepsis on exam, hyperglycemic crisis, obstruction though less likely with his still having bowel movements  Additional history obtained: Additional history obtained from nursing home/care facility Records reviewed Nursing Home Documents  ED Course and Reassessment: On patient's arrival he is hemodynamically stable in no acute distress.  He was initially evaluated in triage and had EKG and labs performed.  EKG showed normal sinus rhythm without acute ischemic changes, troponin was performed and was negative, he is not having any chest pain and has had lower abdominal pain since this morning so single troponin is sufficient.  He has a minimally elevated lipase and mild hyperglycemia without DKA.  Urine is positive for UTI with rare bacteria, nitrites and WBCs.  Is started on Rocephin .  With his significant pain, will have CT to evaluate for Pyelo or other complication from his infection.  Was given pain and nausea control and will be closely reassessed.  Independent labs interpretation:  The following labs were independently interpreted: UA positive for UTI, otherwise  labs within normal range  Independent visualization of imaging: - I independently visualized the following imaging with scope of interpretation limited to determining acute life threatening conditions related to emergency care: CTAP, which revealed cystitis  Consultation: - Consulted or discussed management/test interpretation w/ external professional: N/A  Consideration for admission or further workup: Patient has no emergent conditions requiring admission or further work-up at this time and is stable for discharge home with primary care follow-up  Social Determinants of health: N/A    Amount and/or Complexity  of Data Reviewed Labs: ordered. Radiology: ordered.  Risk OTC drugs. Prescription drug management.          Final Clinical Impression(s) / ED Diagnoses Final diagnoses:  Acute cystitis without hematuria    Rx / DC Orders ED Discharge Orders          Ordered    cephALEXin  (KEFLEX ) 500 MG capsule  4 times daily        09/18/23 2110              Kingsley, Sokha Craker K, DO 09/18/23 2121

## 2023-09-18 NOTE — ED Triage Notes (Signed)
 Pt BIB by EMS from [facility] for ABD and nausea. Denies vomiting, dry heaving noted.

## 2023-09-18 NOTE — ED Notes (Signed)
 Spoke with sister Jacob [name] who states patient does have some dementia. Also gave her update as to how he is now. She asks that we call back with more information when it is available.  [phone]

## 2023-09-18 NOTE — ED Notes (Signed)
 ED Provider at bedside.

## 2023-09-18 NOTE — Discharge Instructions (Addendum)
 You were seen in the emergency department for your abdominal pain.  You still be urinary tract infection.  I have switched your antibiotics.  Stop taking the Bactrim  and start Keflex .  We did send a new urine culture so if this grows positive to a different bacteria you will receive a call to change your antibiotics.  You can follow-up with your primary doctor to have your symptoms rechecked.  You should return to the emergency department if you having fevers despite the antibiotics, severe back pain, repetitive vomiting or any other new or concerning symptoms.

## 2023-09-19 ENCOUNTER — Other Ambulatory Visit: Payer: Self-pay

## 2023-09-19 ENCOUNTER — Ambulatory Visit
Admission: RE | Admit: 2023-09-19 | Discharge: 2023-09-19 | Disposition: A | Discharge status: Home / Self Care | Source: Ambulatory Visit | Attending: Radiation Oncology | Admitting: Radiation Oncology

## 2023-09-19 DIAGNOSIS — Z510 Encounter for antineoplastic radiation therapy: Secondary | ICD-10-CM | POA: Diagnosis not present

## 2023-09-19 DIAGNOSIS — C61 Malignant neoplasm of prostate: Secondary | ICD-10-CM | POA: Diagnosis not present

## 2023-09-19 DIAGNOSIS — R339 Retention of urine, unspecified: Secondary | ICD-10-CM | POA: Diagnosis not present

## 2023-09-19 DIAGNOSIS — Z191 Hormone sensitive malignancy status: Secondary | ICD-10-CM | POA: Diagnosis not present

## 2023-09-19 LAB — RAD ONC ARIA SESSION SUMMARY
Course Elapsed Days: 43
Plan Fractions Treated to Date: 5
Plan Prescribed Dose Per Fraction: 1.8 Gy
Plan Total Fractions Prescribed: 13
Plan Total Prescribed Dose: 23.4 Gy
Reference Point Dosage Given to Date: 9 Gy
Reference Point Session Dosage Given: 1.8 Gy
Session Number: 30

## 2023-09-20 ENCOUNTER — Other Ambulatory Visit: Payer: Self-pay

## 2023-09-20 ENCOUNTER — Ambulatory Visit
Admission: RE | Admit: 2023-09-20 | Discharge: 2023-09-20 | Disposition: A | Discharge status: Home / Self Care | Source: Ambulatory Visit | Attending: Radiation Oncology | Admitting: Radiation Oncology

## 2023-09-20 DIAGNOSIS — Z510 Encounter for antineoplastic radiation therapy: Secondary | ICD-10-CM | POA: Diagnosis not present

## 2023-09-20 DIAGNOSIS — C61 Malignant neoplasm of prostate: Secondary | ICD-10-CM | POA: Diagnosis not present

## 2023-09-20 DIAGNOSIS — R339 Retention of urine, unspecified: Secondary | ICD-10-CM | POA: Diagnosis not present

## 2023-09-20 DIAGNOSIS — Z191 Hormone sensitive malignancy status: Secondary | ICD-10-CM | POA: Diagnosis not present

## 2023-09-20 DIAGNOSIS — N390 Urinary tract infection, site not specified: Secondary | ICD-10-CM | POA: Diagnosis not present

## 2023-09-20 LAB — URINE CULTURE: Culture: NO GROWTH

## 2023-09-20 LAB — RAD ONC ARIA SESSION SUMMARY
Course Elapsed Days: 44
Plan Fractions Treated to Date: 6
Plan Prescribed Dose Per Fraction: 1.8 Gy
Plan Total Fractions Prescribed: 13
Plan Total Prescribed Dose: 23.4 Gy
Reference Point Dosage Given to Date: 10.8 Gy
Reference Point Session Dosage Given: 1.8 Gy
Session Number: 31

## 2023-09-20 NOTE — Progress Notes (Signed)
 Patient came around to the nursing clinic following his radiation treatment with complaints of dizziness starting a hour prior.  He denies any new medications except for an antibiotic that he has been on for a couple of days.  He reports he is eating and drinking fairly well.  Vitals in the clinic are stable.  After sitting in the clinic for a few minutes he felt a little better.  This was discussed with the PA and she advises the patient to continue to eat and drink, change positions slowly, monitor dizziness and report to the ER should it worsen.  Patient verbalized understanding.   BP (!) 134/91   Pulse (!) 101   Temp 97.8 F (36.6 C) (Temporal)   Resp 20   SpO2 100%    Christianne Cowper RN, BSN

## 2023-09-21 ENCOUNTER — Ambulatory Visit
Admission: RE | Admit: 2023-09-21 | Discharge: 2023-09-21 | Disposition: A | Discharge status: Home / Self Care | Source: Ambulatory Visit | Attending: Radiation Oncology | Admitting: Radiation Oncology

## 2023-09-21 ENCOUNTER — Other Ambulatory Visit: Payer: Self-pay

## 2023-09-21 DIAGNOSIS — Z191 Hormone sensitive malignancy status: Secondary | ICD-10-CM | POA: Diagnosis not present

## 2023-09-21 DIAGNOSIS — C61 Malignant neoplasm of prostate: Secondary | ICD-10-CM | POA: Diagnosis not present

## 2023-09-21 DIAGNOSIS — Z510 Encounter for antineoplastic radiation therapy: Secondary | ICD-10-CM | POA: Diagnosis not present

## 2023-09-21 DIAGNOSIS — R339 Retention of urine, unspecified: Secondary | ICD-10-CM | POA: Diagnosis not present

## 2023-09-21 LAB — RAD ONC ARIA SESSION SUMMARY
Course Elapsed Days: 45
Plan Fractions Treated to Date: 7
Plan Prescribed Dose Per Fraction: 1.8 Gy
Plan Total Fractions Prescribed: 13
Plan Total Prescribed Dose: 23.4 Gy
Reference Point Dosage Given to Date: 12.6 Gy
Reference Point Session Dosage Given: 1.8 Gy
Session Number: 32

## 2023-09-22 ENCOUNTER — Ambulatory Visit
Admission: RE | Admit: 2023-09-22 | Discharge: 2023-09-22 | Disposition: A | Discharge status: Home / Self Care | Source: Ambulatory Visit | Attending: Radiation Oncology | Admitting: Radiation Oncology

## 2023-09-22 ENCOUNTER — Other Ambulatory Visit: Payer: Self-pay

## 2023-09-22 DIAGNOSIS — C61 Malignant neoplasm of prostate: Secondary | ICD-10-CM | POA: Diagnosis not present

## 2023-09-22 DIAGNOSIS — R339 Retention of urine, unspecified: Secondary | ICD-10-CM | POA: Diagnosis not present

## 2023-09-22 DIAGNOSIS — Z191 Hormone sensitive malignancy status: Secondary | ICD-10-CM | POA: Diagnosis not present

## 2023-09-22 DIAGNOSIS — Z510 Encounter for antineoplastic radiation therapy: Secondary | ICD-10-CM | POA: Diagnosis not present

## 2023-09-22 LAB — RAD ONC ARIA SESSION SUMMARY
Course Elapsed Days: 46
Plan Fractions Treated to Date: 8
Plan Prescribed Dose Per Fraction: 1.8 Gy
Plan Total Fractions Prescribed: 13
Plan Total Prescribed Dose: 23.4 Gy
Reference Point Dosage Given to Date: 14.4 Gy
Reference Point Session Dosage Given: 1.8 Gy
Session Number: 33

## 2023-09-26 ENCOUNTER — Ambulatory Visit
Admission: RE | Admit: 2023-09-26 | Discharge: 2023-09-26 | Disposition: A | Discharge status: Home / Self Care | Source: Ambulatory Visit | Attending: Radiation Oncology | Admitting: Radiation Oncology

## 2023-09-26 ENCOUNTER — Other Ambulatory Visit: Payer: Self-pay

## 2023-09-26 DIAGNOSIS — R339 Retention of urine, unspecified: Secondary | ICD-10-CM | POA: Diagnosis not present

## 2023-09-26 DIAGNOSIS — E119 Type 2 diabetes mellitus without complications: Secondary | ICD-10-CM | POA: Diagnosis not present

## 2023-09-26 DIAGNOSIS — E782 Mixed hyperlipidemia: Secondary | ICD-10-CM | POA: Diagnosis not present

## 2023-09-26 DIAGNOSIS — C61 Malignant neoplasm of prostate: Secondary | ICD-10-CM | POA: Diagnosis not present

## 2023-09-26 DIAGNOSIS — Z191 Hormone sensitive malignancy status: Secondary | ICD-10-CM | POA: Diagnosis not present

## 2023-09-26 DIAGNOSIS — Z510 Encounter for antineoplastic radiation therapy: Secondary | ICD-10-CM | POA: Diagnosis not present

## 2023-09-26 DIAGNOSIS — D519 Vitamin B12 deficiency anemia, unspecified: Secondary | ICD-10-CM | POA: Diagnosis not present

## 2023-09-26 DIAGNOSIS — Z79899 Other long term (current) drug therapy: Secondary | ICD-10-CM | POA: Diagnosis not present

## 2023-09-26 DIAGNOSIS — E038 Other specified hypothyroidism: Secondary | ICD-10-CM | POA: Diagnosis not present

## 2023-09-26 LAB — RAD ONC ARIA SESSION SUMMARY
Course Elapsed Days: 50
Plan Fractions Treated to Date: 9
Plan Prescribed Dose Per Fraction: 1.8 Gy
Plan Total Fractions Prescribed: 13
Plan Total Prescribed Dose: 23.4 Gy
Reference Point Dosage Given to Date: 16.2 Gy
Reference Point Session Dosage Given: 1.8 Gy
Session Number: 34

## 2023-09-27 ENCOUNTER — Other Ambulatory Visit: Payer: Self-pay

## 2023-09-27 ENCOUNTER — Ambulatory Visit
Admission: RE | Admit: 2023-09-27 | Discharge: 2023-09-27 | Discharge status: Home / Self Care | Source: Ambulatory Visit | Attending: Radiation Oncology

## 2023-09-27 DIAGNOSIS — Z191 Hormone sensitive malignancy status: Secondary | ICD-10-CM | POA: Diagnosis not present

## 2023-09-27 DIAGNOSIS — Z510 Encounter for antineoplastic radiation therapy: Secondary | ICD-10-CM | POA: Diagnosis not present

## 2023-09-27 DIAGNOSIS — C61 Malignant neoplasm of prostate: Secondary | ICD-10-CM | POA: Diagnosis not present

## 2023-09-27 DIAGNOSIS — I10 Essential (primary) hypertension: Secondary | ICD-10-CM | POA: Diagnosis not present

## 2023-09-27 DIAGNOSIS — R339 Retention of urine, unspecified: Secondary | ICD-10-CM | POA: Diagnosis not present

## 2023-09-27 LAB — RAD ONC ARIA SESSION SUMMARY
Course Elapsed Days: 51
Plan Fractions Treated to Date: 10
Plan Prescribed Dose Per Fraction: 1.8 Gy
Plan Total Fractions Prescribed: 13
Plan Total Prescribed Dose: 23.4 Gy
Reference Point Dosage Given to Date: 18 Gy
Reference Point Session Dosage Given: 1.8 Gy
Session Number: 35

## 2023-09-28 ENCOUNTER — Ambulatory Visit
Admission: RE | Admit: 2023-09-28 | Discharge: 2023-09-28 | Disposition: A | Discharge status: Home / Self Care | Source: Ambulatory Visit | Attending: Radiation Oncology | Admitting: Radiation Oncology

## 2023-09-28 ENCOUNTER — Other Ambulatory Visit: Payer: Self-pay

## 2023-09-28 DIAGNOSIS — Z191 Hormone sensitive malignancy status: Secondary | ICD-10-CM | POA: Diagnosis not present

## 2023-09-28 DIAGNOSIS — Z510 Encounter for antineoplastic radiation therapy: Secondary | ICD-10-CM | POA: Diagnosis not present

## 2023-09-28 DIAGNOSIS — C61 Malignant neoplasm of prostate: Secondary | ICD-10-CM | POA: Diagnosis not present

## 2023-09-28 DIAGNOSIS — R339 Retention of urine, unspecified: Secondary | ICD-10-CM | POA: Diagnosis not present

## 2023-09-28 LAB — RAD ONC ARIA SESSION SUMMARY
Course Elapsed Days: 52
Plan Fractions Treated to Date: 11
Plan Prescribed Dose Per Fraction: 1.8 Gy
Plan Total Fractions Prescribed: 13
Plan Total Prescribed Dose: 23.4 Gy
Reference Point Dosage Given to Date: 19.8 Gy
Reference Point Session Dosage Given: 1.8 Gy
Session Number: 36

## 2023-09-29 ENCOUNTER — Ambulatory Visit

## 2023-09-29 ENCOUNTER — Ambulatory Visit
Admission: RE | Admit: 2023-09-29 | Discharge: 2023-09-29 | Disposition: A | Discharge status: Home / Self Care | Source: Ambulatory Visit | Attending: Radiation Oncology | Admitting: Radiation Oncology

## 2023-09-29 ENCOUNTER — Other Ambulatory Visit: Payer: Self-pay

## 2023-09-29 DIAGNOSIS — Z191 Hormone sensitive malignancy status: Secondary | ICD-10-CM | POA: Diagnosis not present

## 2023-09-29 DIAGNOSIS — C61 Malignant neoplasm of prostate: Secondary | ICD-10-CM | POA: Diagnosis not present

## 2023-09-29 DIAGNOSIS — R339 Retention of urine, unspecified: Secondary | ICD-10-CM | POA: Diagnosis not present

## 2023-09-29 DIAGNOSIS — Z510 Encounter for antineoplastic radiation therapy: Secondary | ICD-10-CM | POA: Diagnosis not present

## 2023-09-29 LAB — RAD ONC ARIA SESSION SUMMARY
Course Elapsed Days: 53
Plan Fractions Treated to Date: 12
Plan Prescribed Dose Per Fraction: 1.8 Gy
Plan Total Fractions Prescribed: 13
Plan Total Prescribed Dose: 23.4 Gy
Reference Point Dosage Given to Date: 21.6 Gy
Reference Point Session Dosage Given: 1.8 Gy
Session Number: 37

## 2023-10-01 DIAGNOSIS — E559 Vitamin D deficiency, unspecified: Secondary | ICD-10-CM | POA: Diagnosis not present

## 2023-10-01 DIAGNOSIS — E876 Hypokalemia: Secondary | ICD-10-CM | POA: Diagnosis not present

## 2023-10-01 DIAGNOSIS — E119 Type 2 diabetes mellitus without complications: Secondary | ICD-10-CM | POA: Diagnosis not present

## 2023-10-01 DIAGNOSIS — Z0000 Encounter for general adult medical examination without abnormal findings: Secondary | ICD-10-CM | POA: Diagnosis not present

## 2023-10-01 DIAGNOSIS — E785 Hyperlipidemia, unspecified: Secondary | ICD-10-CM | POA: Diagnosis not present

## 2023-10-02 ENCOUNTER — Other Ambulatory Visit: Payer: Self-pay

## 2023-10-02 ENCOUNTER — Ambulatory Visit
Admission: RE | Admit: 2023-10-02 | Discharge: 2023-10-02 | Disposition: A | Discharge status: Home / Self Care | Source: Ambulatory Visit | Attending: Radiation Oncology | Admitting: Radiation Oncology

## 2023-10-02 ENCOUNTER — Ambulatory Visit: Admission: RE | Admit: 2023-10-02 | Source: Ambulatory Visit

## 2023-10-02 ENCOUNTER — Ambulatory Visit

## 2023-10-02 DIAGNOSIS — Z510 Encounter for antineoplastic radiation therapy: Secondary | ICD-10-CM | POA: Diagnosis not present

## 2023-10-02 DIAGNOSIS — C61 Malignant neoplasm of prostate: Secondary | ICD-10-CM | POA: Diagnosis present

## 2023-10-02 DIAGNOSIS — Z191 Hormone sensitive malignancy status: Secondary | ICD-10-CM | POA: Diagnosis not present

## 2023-10-02 LAB — RAD ONC ARIA SESSION SUMMARY
Course Elapsed Days: 56
Plan Fractions Treated to Date: 13
Plan Prescribed Dose Per Fraction: 1.8 Gy
Plan Total Fractions Prescribed: 13
Plan Total Prescribed Dose: 23.4 Gy
Reference Point Dosage Given to Date: 23.4 Gy
Reference Point Session Dosage Given: 1.8 Gy
Session Number: 38

## 2023-10-03 ENCOUNTER — Ambulatory Visit

## 2023-10-03 DIAGNOSIS — Z79899 Other long term (current) drug therapy: Secondary | ICD-10-CM | POA: Diagnosis not present

## 2023-10-03 NOTE — Radiation Completion Notes (Addendum)
  Radiation Oncology         [phone] ________________________________  Name: Jacob [name] MRN: [id number]  Date: 10/02/2023  DOB: Mar 12, 1956  Referring Physician: Kristeen Peto, M.D. Date of Service: 2023-10-03 Radiation Oncologist: Bartholome Ligas, M.D. [facility]     RADIATION ONCOLOGY END OF TREATMENT NOTE     Diagnosis: 68 y.o. gentleman with a rising, detectable postoperative PSA of 2.6 s/p RALP 01/2022 for Stage pT3bN1, Gleason 4+5 prostate cancer   Intent: Curative     ==========DELIVERED PLANS==========  First Treatment Date: 2023-08-07 Last Treatment Date: 2023-10-02   Plan Name: ProstBed_Pelv Site: Prostate Bed Technique: IMRT Mode: Photon Dose Per Fraction: 1.8 Gy Prescribed Dose (Delivered / Prescribed): 45 Gy / 45 Gy Prescribed Fxs (Delivered / Prescribed): 25 / 25   Plan Name: ProstBed_Bst Site: Prostate Bed Technique: IMRT Mode: Photon Dose Per Fraction: 1.8 Gy Prescribed Dose (Delivered / Prescribed): 23.4 Gy / 23.4 Gy Prescribed Fxs (Delivered / Prescribed): 13 / 13     ==========ON TREATMENT VISIT DATES========== 2023-08-11, 2023-08-18, 2023-08-25, 2023-09-01, 2023-09-07, 2023-09-15, 2023-09-22, 2023-09-29   See weekly On Treatment Notes in Epic for details in the Media tab (listed as Progress notes on the On Treatment Visit Dates listed above).  He tolerated the treatments relatively well with only mild increased LUTS and modest fatigue.  The patient will receive a call in about one month from the radiation oncology department. He will continue follow up with his urologist, Dr. Rozanne Corners, as well.  ------------------------------------------------   [name], MD [facility]  Radiation Oncology Direct Dial: [phone]  Fax: [phone] [email or web address]  Skype  [email or web address]

## 2023-10-04 DIAGNOSIS — E538 Deficiency of other specified B group vitamins: Secondary | ICD-10-CM | POA: Diagnosis not present

## 2023-10-04 DIAGNOSIS — R899 Unspecified abnormal finding in specimens from other organs, systems and tissues: Secondary | ICD-10-CM | POA: Diagnosis not present

## 2023-10-04 DIAGNOSIS — R300 Dysuria: Secondary | ICD-10-CM | POA: Diagnosis not present

## 2023-10-04 DIAGNOSIS — R339 Retention of urine, unspecified: Secondary | ICD-10-CM | POA: Diagnosis not present

## 2023-10-06 DIAGNOSIS — B3749 Other urogenital candidiasis: Secondary | ICD-10-CM | POA: Diagnosis not present

## 2023-10-06 DIAGNOSIS — Z79899 Other long term (current) drug therapy: Secondary | ICD-10-CM | POA: Diagnosis not present

## 2023-10-06 DIAGNOSIS — R9431 Abnormal electrocardiogram [ECG] [EKG]: Secondary | ICD-10-CM | POA: Diagnosis not present

## 2023-10-06 NOTE — Progress Notes (Signed)
 Patient was presented at the [facility] on 11/08/22 for his rising PSA of 2.6 s/p RALP 01/2022 for Stage pT3bN1, Gleason 4+5 prostate cancer .  Patient proceed with treatment recommendations of 7.5 weeks of salvage radiation treatment directed to the prostatic fossa and pelvic lymph nodes in combination with ADT and had his final radiation treatment on 10/02/23.   Patient is scheduled for a post treatment nurse call on 10/31/23 and has his first post treatment PSA on 10/18/23 at [facility].

## 2023-10-08 DIAGNOSIS — Z20822 Contact with and (suspected) exposure to covid-19: Secondary | ICD-10-CM | POA: Diagnosis not present

## 2023-10-08 DIAGNOSIS — I10 Essential (primary) hypertension: Secondary | ICD-10-CM | POA: Diagnosis not present

## 2023-10-08 DIAGNOSIS — R0902 Hypoxemia: Secondary | ICD-10-CM | POA: Diagnosis not present

## 2023-10-08 DIAGNOSIS — I7121 Aneurysm of the ascending aorta, without rupture: Secondary | ICD-10-CM | POA: Diagnosis not present

## 2023-10-08 DIAGNOSIS — D696 Thrombocytopenia, unspecified: Secondary | ICD-10-CM | POA: Diagnosis not present

## 2023-10-08 DIAGNOSIS — J9691 Respiratory failure, unspecified with hypoxia: Secondary | ICD-10-CM | POA: Diagnosis not present

## 2023-10-08 DIAGNOSIS — K4090 Unilateral inguinal hernia, without obstruction or gangrene, not specified as recurrent: Secondary | ICD-10-CM | POA: Diagnosis not present

## 2023-10-08 DIAGNOSIS — R338 Other retention of urine: Secondary | ICD-10-CM | POA: Diagnosis not present

## 2023-10-08 DIAGNOSIS — I700 Atherosclerosis of aorta: Secondary | ICD-10-CM | POA: Diagnosis not present

## 2023-10-08 DIAGNOSIS — T83511A Infection and inflammatory reaction due to indwelling urethral catheter, initial encounter: Secondary | ICD-10-CM | POA: Diagnosis not present

## 2023-10-08 DIAGNOSIS — K56609 Unspecified intestinal obstruction, unspecified as to partial versus complete obstruction: Secondary | ICD-10-CM | POA: Diagnosis not present

## 2023-10-08 DIAGNOSIS — J9811 Atelectasis: Secondary | ICD-10-CM | POA: Diagnosis not present

## 2023-10-08 DIAGNOSIS — I498 Other specified cardiac arrhythmias: Secondary | ICD-10-CM | POA: Diagnosis not present

## 2023-10-08 DIAGNOSIS — N390 Urinary tract infection, site not specified: Secondary | ICD-10-CM | POA: Diagnosis not present

## 2023-10-08 DIAGNOSIS — Z1152 Encounter for screening for COVID-19: Secondary | ICD-10-CM | POA: Diagnosis not present

## 2023-10-08 DIAGNOSIS — R6889 Other general symptoms and signs: Secondary | ICD-10-CM | POA: Diagnosis not present

## 2023-10-08 DIAGNOSIS — R531 Weakness: Secondary | ICD-10-CM | POA: Diagnosis not present

## 2023-10-08 DIAGNOSIS — D649 Anemia, unspecified: Secondary | ICD-10-CM | POA: Diagnosis not present

## 2023-10-08 DIAGNOSIS — E119 Type 2 diabetes mellitus without complications: Secondary | ICD-10-CM | POA: Diagnosis not present

## 2023-10-08 DIAGNOSIS — I771 Stricture of artery: Secondary | ICD-10-CM | POA: Diagnosis not present

## 2023-10-08 DIAGNOSIS — R9389 Abnormal findings on diagnostic imaging of other specified body structures: Secondary | ICD-10-CM | POA: Diagnosis not present

## 2023-10-08 DIAGNOSIS — Z7984 Long term (current) use of oral hypoglycemic drugs: Secondary | ICD-10-CM | POA: Diagnosis not present

## 2023-10-08 DIAGNOSIS — Z743 Need for continuous supervision: Secondary | ICD-10-CM | POA: Diagnosis not present

## 2023-10-08 DIAGNOSIS — K5730 Diverticulosis of large intestine without perforation or abscess without bleeding: Secondary | ICD-10-CM | POA: Diagnosis not present

## 2023-10-08 DIAGNOSIS — B951 Streptococcus, group B, as the cause of diseases classified elsewhere: Secondary | ICD-10-CM | POA: Diagnosis not present

## 2023-10-08 DIAGNOSIS — Z7982 Long term (current) use of aspirin: Secondary | ICD-10-CM | POA: Diagnosis not present

## 2023-10-08 DIAGNOSIS — R404 Transient alteration of awareness: Secondary | ICD-10-CM | POA: Diagnosis not present

## 2023-10-08 DIAGNOSIS — N3090 Cystitis, unspecified without hematuria: Secondary | ICD-10-CM | POA: Diagnosis not present

## 2023-10-10 DIAGNOSIS — N390 Urinary tract infection, site not specified: Secondary | ICD-10-CM | POA: Diagnosis not present

## 2023-10-13 ENCOUNTER — Other Ambulatory Visit: Payer: Self-pay | Admitting: Urology

## 2023-10-13 DIAGNOSIS — C61 Malignant neoplasm of prostate: Secondary | ICD-10-CM

## 2023-10-18 DIAGNOSIS — E119 Type 2 diabetes mellitus without complications: Secondary | ICD-10-CM | POA: Diagnosis not present

## 2023-10-18 DIAGNOSIS — I10 Essential (primary) hypertension: Secondary | ICD-10-CM | POA: Diagnosis not present

## 2023-10-18 DIAGNOSIS — B3749 Other urogenital candidiasis: Secondary | ICD-10-CM | POA: Diagnosis not present

## 2023-10-23 DIAGNOSIS — Z79899 Other long term (current) drug therapy: Secondary | ICD-10-CM | POA: Diagnosis not present

## 2023-10-23 DIAGNOSIS — R945 Abnormal results of liver function studies: Secondary | ICD-10-CM | POA: Diagnosis not present

## 2023-10-25 DIAGNOSIS — R338 Other retention of urine: Secondary | ICD-10-CM | POA: Diagnosis not present

## 2023-10-25 DIAGNOSIS — C775 Secondary and unspecified malignant neoplasm of intrapelvic lymph nodes: Secondary | ICD-10-CM | POA: Diagnosis not present

## 2023-10-26 NOTE — Progress Notes (Signed)
  Radiation Oncology         [phone]) [phone] ________________________________  Name: Jacob [name] MRN: [id number]  Date of Service: 10/31/2023  DOB: 1955-07-30  Post Treatment Telephone Note  Diagnosis: 68 y.o. gentleman with a rising, detectable postoperative PSA of 2.6 s/p RALP 01/2022 for Stage pT3bN1, Gleason 4+5 prostate cancer  Pre Treatment IPSS Score: 14 (as documented in the provider consult note)  The patient was not available for call today. Unable to reach patient for today's appointment. 2 calls. No answer. Message was left w/ my extension [phone]. Call complete.  Patient has a scheduled follow up visit with his urologist, Dr. Renda, on 10/2023 for ongoing surveillance. He was counseled that PSA levels will be drawn in the urology office, and was reassured that additional time is expected to improve bowel and bladder symptoms. He was encouraged to call back with concerns or questions regarding radiation.   This concludes the interaction.  Rosaline Minerva, LPN

## 2023-10-28 DIAGNOSIS — I10 Essential (primary) hypertension: Secondary | ICD-10-CM | POA: Diagnosis not present

## 2023-10-30 DIAGNOSIS — I10 Essential (primary) hypertension: Secondary | ICD-10-CM | POA: Diagnosis not present

## 2023-10-30 DIAGNOSIS — E785 Hyperlipidemia, unspecified: Secondary | ICD-10-CM | POA: Diagnosis not present

## 2023-10-30 DIAGNOSIS — I639 Cerebral infarction, unspecified: Secondary | ICD-10-CM | POA: Diagnosis not present

## 2023-10-31 ENCOUNTER — Ambulatory Visit
Admission: RE | Admit: 2023-10-31 | Discharge: 2023-10-31 | Disposition: A | Discharge status: Home / Self Care | Source: Ambulatory Visit | Attending: Radiation Oncology | Admitting: Radiation Oncology

## 2023-10-31 DIAGNOSIS — C61 Malignant neoplasm of prostate: Secondary | ICD-10-CM | POA: Insufficient documentation

## 2023-11-03 DIAGNOSIS — I639 Cerebral infarction, unspecified: Secondary | ICD-10-CM | POA: Diagnosis not present

## 2023-11-03 DIAGNOSIS — E785 Hyperlipidemia, unspecified: Secondary | ICD-10-CM | POA: Diagnosis not present

## 2023-11-03 DIAGNOSIS — I10 Essential (primary) hypertension: Secondary | ICD-10-CM | POA: Diagnosis not present

## 2023-11-15 DIAGNOSIS — I10 Essential (primary) hypertension: Secondary | ICD-10-CM | POA: Diagnosis not present

## 2023-11-15 DIAGNOSIS — E119 Type 2 diabetes mellitus without complications: Secondary | ICD-10-CM | POA: Diagnosis not present

## 2023-11-27 DIAGNOSIS — I10 Essential (primary) hypertension: Secondary | ICD-10-CM | POA: Diagnosis not present

## 2023-12-11 DIAGNOSIS — I639 Cerebral infarction, unspecified: Secondary | ICD-10-CM | POA: Diagnosis not present

## 2023-12-11 DIAGNOSIS — I10 Essential (primary) hypertension: Secondary | ICD-10-CM | POA: Diagnosis not present

## 2023-12-11 DIAGNOSIS — E785 Hyperlipidemia, unspecified: Secondary | ICD-10-CM | POA: Diagnosis not present

## 2023-12-13 DIAGNOSIS — E119 Type 2 diabetes mellitus without complications: Secondary | ICD-10-CM | POA: Diagnosis not present

## 2023-12-13 DIAGNOSIS — I10 Essential (primary) hypertension: Secondary | ICD-10-CM | POA: Diagnosis not present

## 2023-12-16 DIAGNOSIS — E119 Type 2 diabetes mellitus without complications: Secondary | ICD-10-CM | POA: Diagnosis not present

## 2023-12-16 DIAGNOSIS — E785 Hyperlipidemia, unspecified: Secondary | ICD-10-CM | POA: Diagnosis not present

## 2023-12-16 DIAGNOSIS — I10 Essential (primary) hypertension: Secondary | ICD-10-CM | POA: Diagnosis not present

## 2023-12-19 DIAGNOSIS — Z79899 Other long term (current) drug therapy: Secondary | ICD-10-CM | POA: Diagnosis not present

## 2023-12-19 DIAGNOSIS — E782 Mixed hyperlipidemia: Secondary | ICD-10-CM | POA: Diagnosis not present

## 2023-12-19 DIAGNOSIS — D519 Vitamin B12 deficiency anemia, unspecified: Secondary | ICD-10-CM | POA: Diagnosis not present

## 2023-12-19 DIAGNOSIS — E038 Other specified hypothyroidism: Secondary | ICD-10-CM | POA: Diagnosis not present

## 2023-12-19 DIAGNOSIS — E119 Type 2 diabetes mellitus without complications: Secondary | ICD-10-CM | POA: Diagnosis not present

## 2023-12-26 DIAGNOSIS — Z79899 Other long term (current) drug therapy: Secondary | ICD-10-CM | POA: Diagnosis not present

## 2023-12-28 ENCOUNTER — Encounter: Payer: Self-pay | Admitting: *Deleted

## 2023-12-28 DIAGNOSIS — I10 Essential (primary) hypertension: Secondary | ICD-10-CM | POA: Diagnosis not present

## 2024-01-05 DIAGNOSIS — I10 Essential (primary) hypertension: Secondary | ICD-10-CM | POA: Diagnosis not present

## 2024-01-05 DIAGNOSIS — I639 Cerebral infarction, unspecified: Secondary | ICD-10-CM | POA: Diagnosis not present

## 2024-01-05 DIAGNOSIS — E785 Hyperlipidemia, unspecified: Secondary | ICD-10-CM | POA: Diagnosis not present

## 2024-01-10 DIAGNOSIS — I10 Essential (primary) hypertension: Secondary | ICD-10-CM | POA: Diagnosis not present

## 2024-01-10 DIAGNOSIS — E119 Type 2 diabetes mellitus without complications: Secondary | ICD-10-CM | POA: Diagnosis not present

## 2024-01-28 DIAGNOSIS — I10 Essential (primary) hypertension: Secondary | ICD-10-CM | POA: Diagnosis not present

## 2024-02-07 DIAGNOSIS — I10 Essential (primary) hypertension: Secondary | ICD-10-CM | POA: Diagnosis not present

## 2024-02-07 DIAGNOSIS — E119 Type 2 diabetes mellitus without complications: Secondary | ICD-10-CM | POA: Diagnosis not present

## 2024-02-14 DIAGNOSIS — H109 Unspecified conjunctivitis: Secondary | ICD-10-CM | POA: Diagnosis not present

## 2024-02-14 DIAGNOSIS — H00019 Hordeolum externum unspecified eye, unspecified eyelid: Secondary | ICD-10-CM | POA: Diagnosis not present

## 2024-04-30 ENCOUNTER — Other Ambulatory Visit (HOSPITAL_COMMUNITY): Payer: Self-pay | Admitting: Urology

## 2024-04-30 DIAGNOSIS — C61 Malignant neoplasm of prostate: Secondary | ICD-10-CM

## 2024-05-17 ENCOUNTER — Encounter (HOSPITAL_COMMUNITY)
Admission: RE | Admit: 2024-05-17 | Discharge: 2024-05-17 | Disposition: A | Discharge status: Home / Self Care | Source: Ambulatory Visit | Attending: Urology | Admitting: Urology

## 2024-05-17 DIAGNOSIS — C61 Malignant neoplasm of prostate: Secondary | ICD-10-CM | POA: Insufficient documentation

## 2024-05-17 MED ORDER — FLOTUFOLASTAT F 18 GALLIUM 296-5846 MBQ/ML IV SOLN
8.7000 | Freq: Once | INTRAVENOUS | Status: AC
  Administered 2024-05-17: 8.5 via INTRAVENOUS
